# Patient Record
Sex: Male | Born: 1940
Health system: Southern US, Community
[De-identification: ages and names within clinical notes are randomized; demographics above are authoritative.]

## PROBLEM LIST (undated history)

## (undated) DIAGNOSIS — R55 Syncope and collapse: Secondary | ICD-10-CM

## (undated) DIAGNOSIS — E7800 Pure hypercholesterolemia, unspecified: Secondary | ICD-10-CM

## (undated) DIAGNOSIS — F32A Depression, unspecified: Secondary | ICD-10-CM

## (undated) DIAGNOSIS — Z950 Presence of cardiac pacemaker: Secondary | ICD-10-CM

## (undated) DIAGNOSIS — F419 Anxiety disorder, unspecified: Secondary | ICD-10-CM

## (undated) DIAGNOSIS — M25569 Pain in unspecified knee: Secondary | ICD-10-CM

## (undated) DIAGNOSIS — F329 Major depressive disorder, single episode, unspecified: Secondary | ICD-10-CM

## (undated) DIAGNOSIS — I509 Heart failure, unspecified: Secondary | ICD-10-CM

## (undated) DIAGNOSIS — M1990 Unspecified osteoarthritis, unspecified site: Secondary | ICD-10-CM

## (undated) DIAGNOSIS — IMO0001 Reserved for inherently not codable concepts without codable children: Secondary | ICD-10-CM

## (undated) DIAGNOSIS — T07XXXA Unspecified multiple injuries, initial encounter: Secondary | ICD-10-CM

## (undated) DIAGNOSIS — I429 Cardiomyopathy, unspecified: Secondary | ICD-10-CM

## (undated) DIAGNOSIS — I459 Conduction disorder, unspecified: Secondary | ICD-10-CM

## (undated) DIAGNOSIS — I10 Essential (primary) hypertension: Secondary | ICD-10-CM

## (undated) DIAGNOSIS — I6529 Occlusion and stenosis of unspecified carotid artery: Secondary | ICD-10-CM

## (undated) DIAGNOSIS — I2510 Atherosclerotic heart disease of native coronary artery without angina pectoris: Secondary | ICD-10-CM

## (undated) DIAGNOSIS — I639 Cerebral infarction, unspecified: Secondary | ICD-10-CM

## (undated) DIAGNOSIS — R739 Hyperglycemia, unspecified: Secondary | ICD-10-CM

## (undated) DIAGNOSIS — K259 Gastric ulcer, unspecified as acute or chronic, without hemorrhage or perforation: Secondary | ICD-10-CM

## (undated) DIAGNOSIS — E039 Hypothyroidism, unspecified: Secondary | ICD-10-CM

## (undated) DIAGNOSIS — K219 Gastro-esophageal reflux disease without esophagitis: Secondary | ICD-10-CM

## (undated) DIAGNOSIS — D649 Anemia, unspecified: Secondary | ICD-10-CM

## (undated) HISTORY — PX: PERCUTANEOUS PLACEMENT INTRAVASCULAR STENT CERVICAL CAROTID ARTERY: SUR1019

## (undated) HISTORY — DX: Hypothyroidism, unspecified: E03.9

## (undated) HISTORY — DX: Unspecified multiple injuries, initial encounter: T07.XXXA

## (undated) HISTORY — DX: Occlusion and stenosis of unspecified carotid artery: I65.29

## (undated) HISTORY — PX: HERNIA REPAIR: SHX51

## (undated) HISTORY — DX: Syncope and collapse: R55

## (undated) HISTORY — PX: CARDIAC CATHETERIZATION: SHX172

## (undated) HISTORY — DX: Essential (primary) hypertension: I10

## (undated) HISTORY — PX: JOINT REPLACEMENT: SHX530

## (undated) HISTORY — DX: Atherosclerotic heart disease of native coronary artery without angina pectoris: I25.10

## (undated) HISTORY — DX: Gastric ulcer, unspecified as acute or chronic, without hemorrhage or perforation: K25.9

## (undated) HISTORY — DX: Hyperglycemia, unspecified: R73.9

## (undated) HISTORY — DX: Anemia, unspecified: D64.9

## (undated) HISTORY — DX: Pure hypercholesterolemia, unspecified: E78.00

## (undated) HISTORY — PX: INSERT / REPLACE / REMOVE PACEMAKER: SUR710

## (undated) HISTORY — PX: CORONARY ANGIOPLASTY: SHX604

---

## 2004-08-29 ENCOUNTER — Ambulatory Visit: Payer: Self-pay | Admitting: Internal Medicine

## 2005-09-19 ENCOUNTER — Ambulatory Visit: Payer: Self-pay | Admitting: Internal Medicine

## 2006-11-05 ENCOUNTER — Ambulatory Visit: Payer: Self-pay | Admitting: Gastroenterology

## 2006-12-06 ENCOUNTER — Emergency Department: Payer: Self-pay | Admitting: Emergency Medicine

## 2006-12-06 ENCOUNTER — Other Ambulatory Visit: Payer: Self-pay

## 2007-04-01 ENCOUNTER — Ambulatory Visit: Payer: Self-pay | Admitting: Internal Medicine

## 2007-06-10 ENCOUNTER — Ambulatory Visit: Payer: Self-pay | Admitting: Internal Medicine

## 2007-07-15 ENCOUNTER — Ambulatory Visit: Payer: Self-pay | Admitting: Internal Medicine

## 2007-07-19 ENCOUNTER — Ambulatory Visit: Payer: Self-pay | Admitting: Internal Medicine

## 2007-07-25 ENCOUNTER — Ambulatory Visit: Payer: Self-pay | Admitting: Vascular Surgery

## 2007-07-28 ENCOUNTER — Inpatient Hospital Stay: Payer: Self-pay | Admitting: Vascular Surgery

## 2007-07-31 ENCOUNTER — Encounter: Payer: Self-pay | Admitting: Vascular Surgery

## 2007-08-25 ENCOUNTER — Encounter: Payer: Self-pay | Admitting: Vascular Surgery

## 2007-08-25 HISTORY — PX: PACEMAKER INSERTION: SHX728

## 2007-09-25 ENCOUNTER — Encounter: Payer: Self-pay | Admitting: Vascular Surgery

## 2007-10-26 ENCOUNTER — Encounter: Payer: Self-pay | Admitting: Vascular Surgery

## 2007-11-23 ENCOUNTER — Encounter: Payer: Self-pay | Admitting: Vascular Surgery

## 2008-07-26 ENCOUNTER — Ambulatory Visit: Payer: Self-pay | Admitting: Unknown Physician Specialty

## 2009-06-17 IMAGING — XA IR VASCULAR PROCEDURE
14 of 20 series · 15 of 24 positions shown · non-contrast
Comparison: none

[Series 1: run · 1 of 10 slices shown (1 of 14)]
[im 1/10]
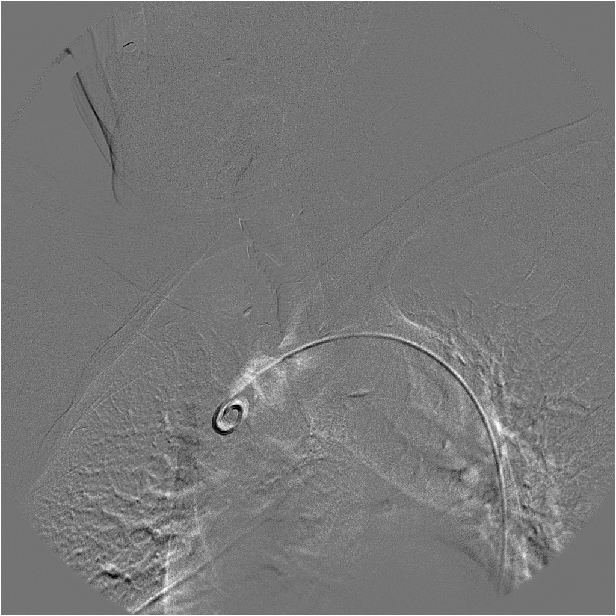

[Series 1: run · 1 of 38 slices shown (2 of 14)]
[im 38/38]
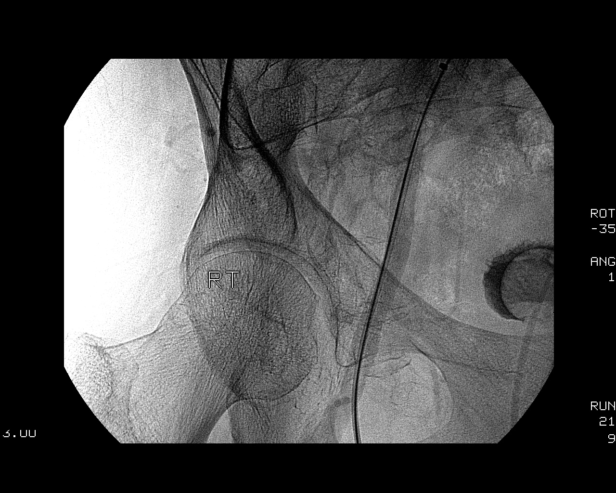

[Series 3: run · 1 of 10 slices shown (3 of 14)]
[im 1/10]
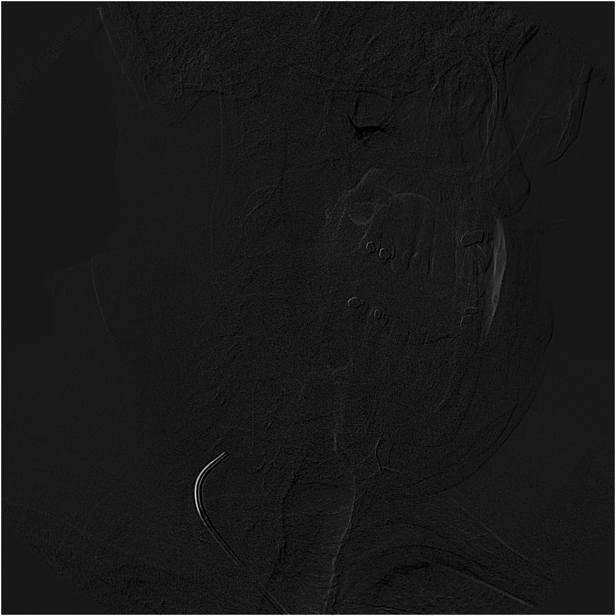

[Series 5: run · 1 of 14 slices shown (4 of 14)]
[im 1/14]
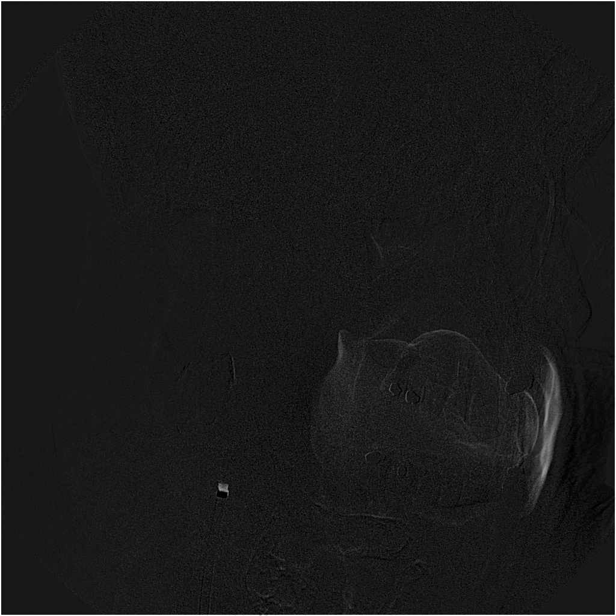

[Series 7: run · 2 of 17 slices shown (5 of 14)]
[im 1/17]
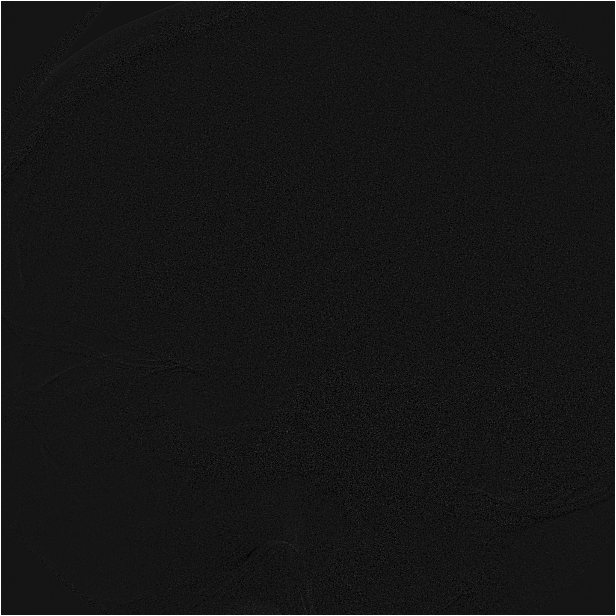
[im 17/17]
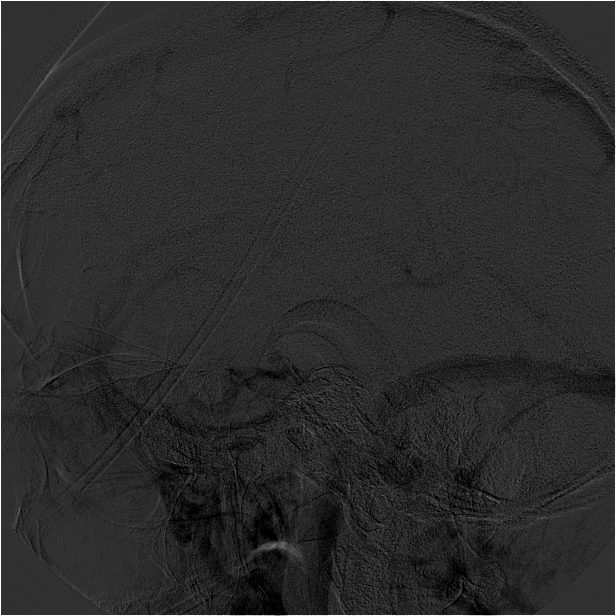

[Series 9: run · 1 of 10 slices shown (6 of 14)]
[im 1/10]
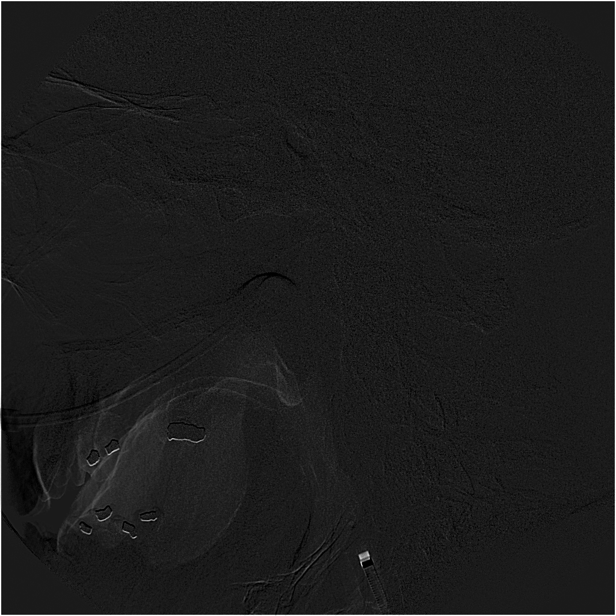

[Series 11: run · 1 of 14 slices shown (7 of 14)]
[im 1/14]
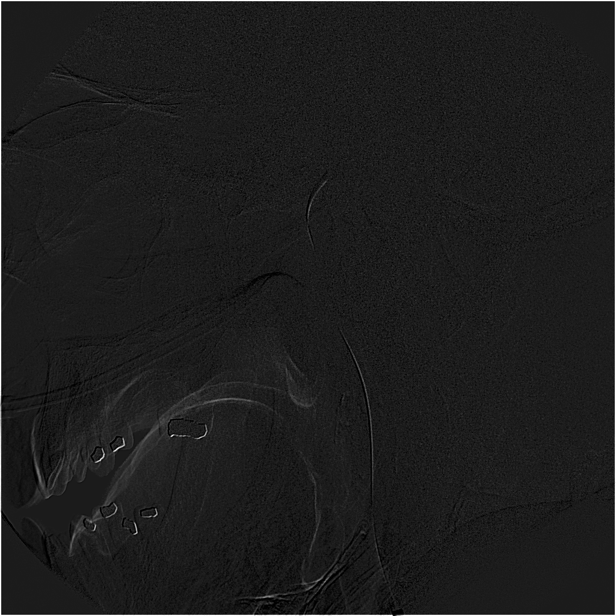

[Series 12: run · 1 of 10 slices shown (8 of 14)]
[im 1/10]
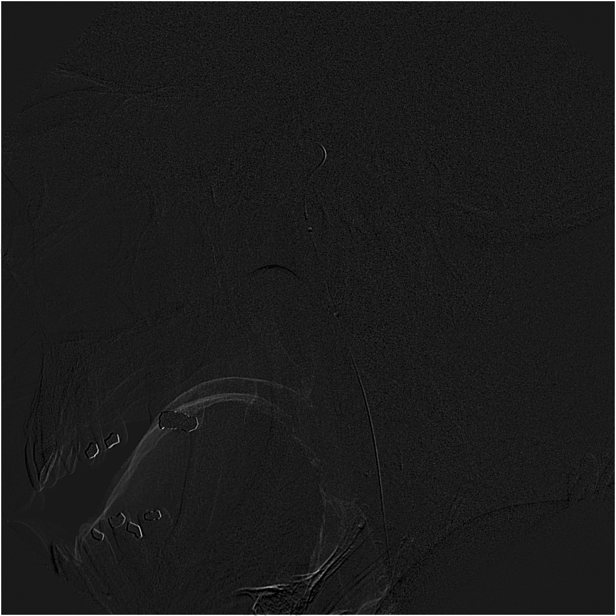

[Series 14: run · 1 of 10 slices shown (9 of 14)]
[im 1/10]
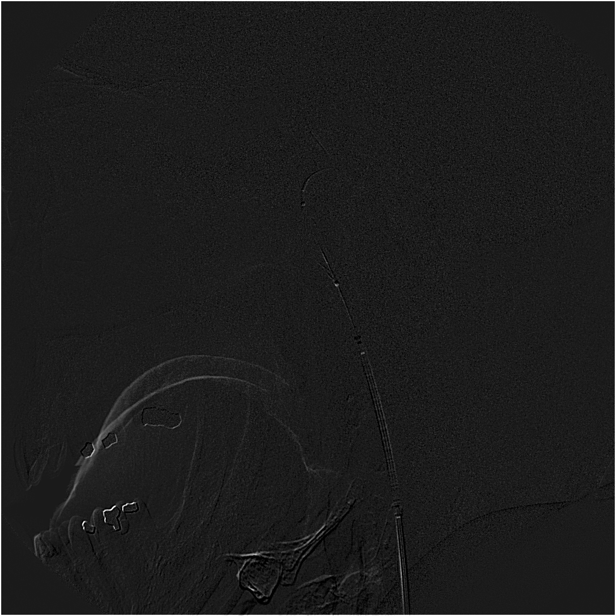

[Series 15: run · 1 of 11 slices shown (10 of 14)]
[im 1/11]
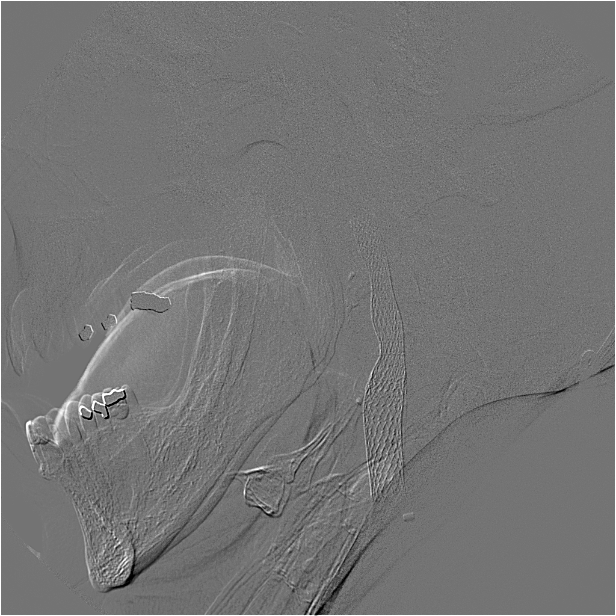

[Series 18: run · 1 of 17 slices shown (11 of 14)]
[im 1/17]
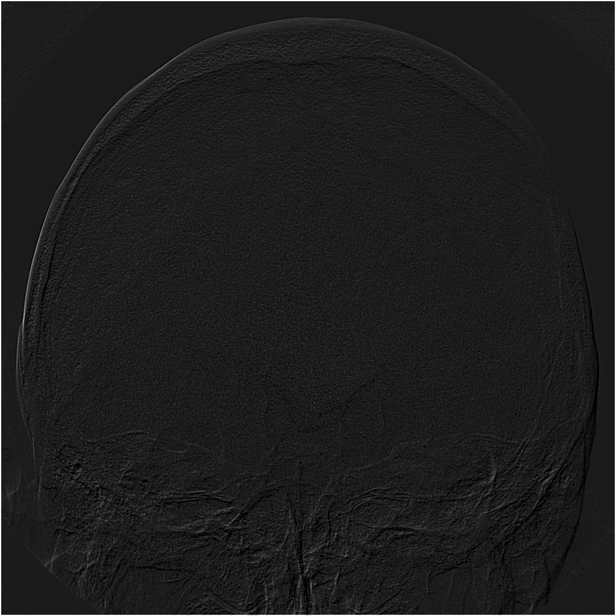

[Series 19: run · 1 of 10 slices shown (12 of 14)]
[im 1/10]
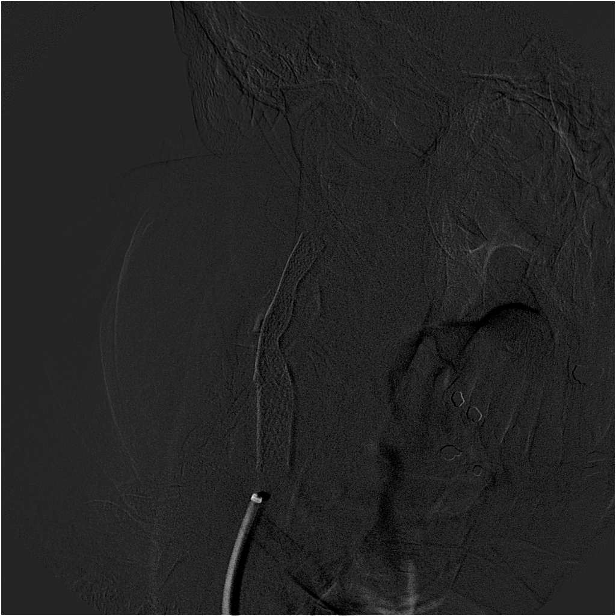

[Series 20: run · 1 of 17 slices shown (13 of 14)]
[im 1/17]
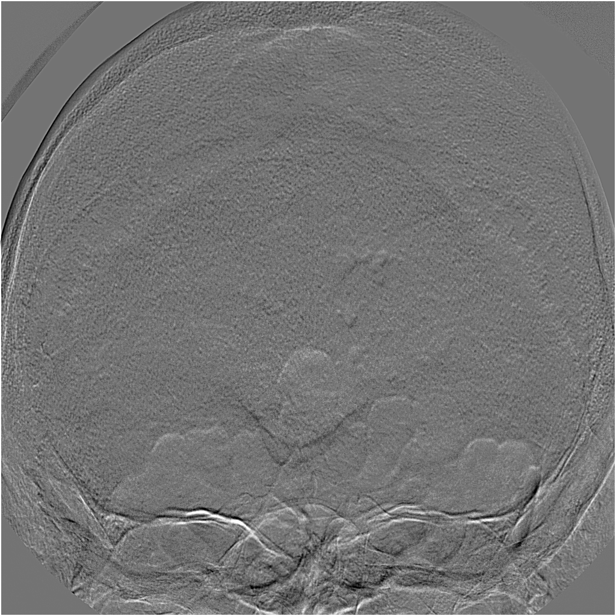

[Series 21: run · 1 of 9 slices shown (14 of 14)]
[im 1/9]
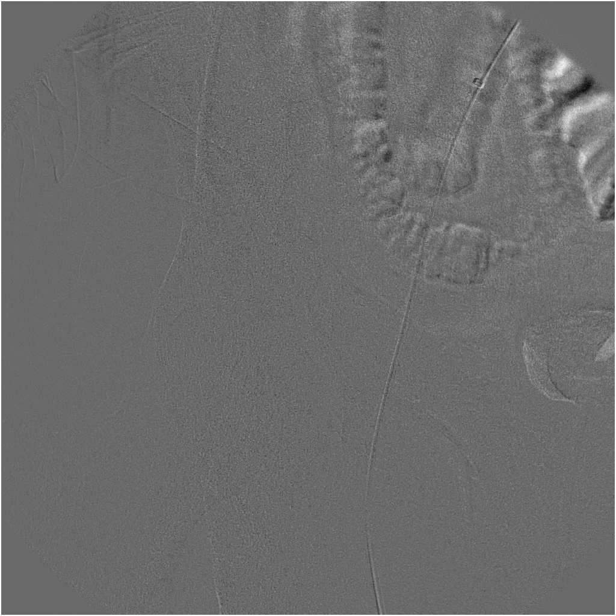

[15 of 24 positions shown; findings below may reference images not displayed]

IMAGES IMPORTED FROM THE [employer] WORKFLOW SYSTEM
NO DICTATION FOR STUDY

## 2010-12-20 ENCOUNTER — Ambulatory Visit: Payer: Self-pay | Admitting: Gastroenterology

## 2011-11-07 DIAGNOSIS — D649 Anemia, unspecified: Secondary | ICD-10-CM | POA: Diagnosis not present

## 2011-11-07 DIAGNOSIS — Z79899 Other long term (current) drug therapy: Secondary | ICD-10-CM | POA: Diagnosis not present

## 2011-11-07 DIAGNOSIS — I10 Essential (primary) hypertension: Secondary | ICD-10-CM | POA: Diagnosis not present

## 2011-11-07 DIAGNOSIS — E7800 Pure hypercholesterolemia, unspecified: Secondary | ICD-10-CM | POA: Diagnosis not present

## 2011-11-12 DIAGNOSIS — I2510 Atherosclerotic heart disease of native coronary artery without angina pectoris: Secondary | ICD-10-CM | POA: Diagnosis not present

## 2011-11-12 DIAGNOSIS — I10 Essential (primary) hypertension: Secondary | ICD-10-CM | POA: Diagnosis not present

## 2011-11-12 DIAGNOSIS — M79609 Pain in unspecified limb: Secondary | ICD-10-CM | POA: Diagnosis not present

## 2011-11-12 DIAGNOSIS — D649 Anemia, unspecified: Secondary | ICD-10-CM | POA: Diagnosis not present

## 2011-11-12 DIAGNOSIS — E7800 Pure hypercholesterolemia, unspecified: Secondary | ICD-10-CM | POA: Diagnosis not present

## 2011-11-12 DIAGNOSIS — B351 Tinea unguium: Secondary | ICD-10-CM | POA: Diagnosis not present

## 2011-11-21 DIAGNOSIS — Z950 Presence of cardiac pacemaker: Secondary | ICD-10-CM | POA: Diagnosis not present

## 2012-02-05 DIAGNOSIS — I2510 Atherosclerotic heart disease of native coronary artery without angina pectoris: Secondary | ICD-10-CM | POA: Diagnosis not present

## 2012-02-05 DIAGNOSIS — I441 Atrioventricular block, second degree: Secondary | ICD-10-CM | POA: Diagnosis not present

## 2012-02-11 DIAGNOSIS — M79609 Pain in unspecified limb: Secondary | ICD-10-CM | POA: Diagnosis not present

## 2012-02-11 DIAGNOSIS — B351 Tinea unguium: Secondary | ICD-10-CM | POA: Diagnosis not present

## 2012-02-20 DIAGNOSIS — Z950 Presence of cardiac pacemaker: Secondary | ICD-10-CM | POA: Diagnosis not present

## 2012-03-11 DIAGNOSIS — D649 Anemia, unspecified: Secondary | ICD-10-CM | POA: Diagnosis not present

## 2012-03-11 DIAGNOSIS — E039 Hypothyroidism, unspecified: Secondary | ICD-10-CM | POA: Diagnosis not present

## 2012-03-11 DIAGNOSIS — I10 Essential (primary) hypertension: Secondary | ICD-10-CM | POA: Diagnosis not present

## 2012-03-11 DIAGNOSIS — E7800 Pure hypercholesterolemia, unspecified: Secondary | ICD-10-CM | POA: Diagnosis not present

## 2012-03-11 DIAGNOSIS — Z79899 Other long term (current) drug therapy: Secondary | ICD-10-CM | POA: Diagnosis not present

## 2012-03-18 DIAGNOSIS — I10 Essential (primary) hypertension: Secondary | ICD-10-CM | POA: Diagnosis not present

## 2012-03-18 DIAGNOSIS — E7800 Pure hypercholesterolemia, unspecified: Secondary | ICD-10-CM | POA: Diagnosis not present

## 2012-05-27 DIAGNOSIS — Z950 Presence of cardiac pacemaker: Secondary | ICD-10-CM | POA: Diagnosis not present

## 2012-07-22 DIAGNOSIS — Z23 Encounter for immunization: Secondary | ICD-10-CM | POA: Diagnosis not present

## 2012-07-25 ENCOUNTER — Telehealth: Payer: Self-pay | Admitting: Internal Medicine

## 2012-07-25 NOTE — Telephone Encounter (Signed)
Ok to call in #30 with 3 refills

## 2012-07-25 NOTE — Telephone Encounter (Signed)
Please advise 

## 2012-07-25 NOTE — Telephone Encounter (Signed)
Refill request for lorazepam 1 mg tablet Sig: take 1 tablet by mouth every day Patient has an appt. On 11/04/12

## 2012-07-29 DIAGNOSIS — I441 Atrioventricular block, second degree: Secondary | ICD-10-CM | POA: Diagnosis not present

## 2012-07-29 DIAGNOSIS — I2510 Atherosclerotic heart disease of native coronary artery without angina pectoris: Secondary | ICD-10-CM | POA: Diagnosis not present

## 2012-07-29 MED ORDER — LORAZEPAM 1 MG PO TABS: 1.0000 mg | ORAL_TABLET | Freq: Once | ORAL | Status: DC

## 2012-07-29 NOTE — Telephone Encounter (Signed)
[facility]ep[facility] called [facility]. As directed by Dr. Lorin Picket

## 2012-08-22 ENCOUNTER — Telehealth: Payer: Self-pay | Admitting: Internal Medicine

## 2012-08-22 MED ORDER — ROSUVASTATIN CALCIUM 10 MG PO TABS: 10.0000 mg | ORAL_TABLET | Freq: Every day | ORAL | Status: DC

## 2012-08-22 NOTE — Telephone Encounter (Signed)
rx sent to [facility] - for crestor 10mg  q day #90 with one refill

## 2012-09-05 ENCOUNTER — Telehealth: Payer: Self-pay | Admitting: Internal Medicine

## 2012-09-05 MED ORDER — ROSUVASTATIN CALCIUM 10 MG PO TABS: 10.0000 mg | ORAL_TABLET | Freq: Every day | ORAL | Status: DC

## 2012-09-05 NOTE — Telephone Encounter (Signed)
Refill request for crestor 10 mg  Qty: 90 day supply Sig: take one tablet by mouth every day

## 2012-09-05 NOTE — Telephone Encounter (Signed)
Sent in to pharmacy.  

## 2012-09-12 DIAGNOSIS — I6529 Occlusion and stenosis of unspecified carotid artery: Secondary | ICD-10-CM | POA: Diagnosis not present

## 2012-09-12 DIAGNOSIS — E785 Hyperlipidemia, unspecified: Secondary | ICD-10-CM | POA: Diagnosis not present

## 2012-09-12 DIAGNOSIS — I10 Essential (primary) hypertension: Secondary | ICD-10-CM | POA: Diagnosis not present

## 2012-10-31 ENCOUNTER — Encounter: Payer: Self-pay | Admitting: Internal Medicine

## 2012-10-31 DIAGNOSIS — I2510 Atherosclerotic heart disease of native coronary artery without angina pectoris: Secondary | ICD-10-CM | POA: Insufficient documentation

## 2012-10-31 DIAGNOSIS — IMO0001 Reserved for inherently not codable concepts without codable children: Secondary | ICD-10-CM | POA: Insufficient documentation

## 2012-10-31 DIAGNOSIS — D649 Anemia, unspecified: Secondary | ICD-10-CM | POA: Insufficient documentation

## 2012-10-31 DIAGNOSIS — I495 Sick sinus syndrome: Secondary | ICD-10-CM | POA: Diagnosis not present

## 2012-10-31 DIAGNOSIS — I10 Essential (primary) hypertension: Secondary | ICD-10-CM | POA: Insufficient documentation

## 2012-10-31 DIAGNOSIS — Z950 Presence of cardiac pacemaker: Secondary | ICD-10-CM | POA: Diagnosis not present

## 2012-10-31 DIAGNOSIS — E7800 Pure hypercholesterolemia, unspecified: Secondary | ICD-10-CM | POA: Insufficient documentation

## 2012-10-31 DIAGNOSIS — E039 Hypothyroidism, unspecified: Secondary | ICD-10-CM | POA: Insufficient documentation

## 2012-10-31 DIAGNOSIS — R1310 Dysphagia, unspecified: Secondary | ICD-10-CM | POA: Insufficient documentation

## 2012-11-04 ENCOUNTER — Ambulatory Visit (INDEPENDENT_AMBULATORY_CARE_PROVIDER_SITE_OTHER): Payer: PRIVATE HEALTH INSURANCE | Admitting: Internal Medicine

## 2012-11-04 ENCOUNTER — Encounter: Payer: Self-pay | Admitting: Internal Medicine

## 2012-11-04 VITALS — BP 120/90 | HR 64 | Temp 97.4°F | Ht 69.5 in | Wt 214.0 lb

## 2012-11-04 DIAGNOSIS — R079 Chest pain, unspecified: Secondary | ICD-10-CM

## 2012-11-04 DIAGNOSIS — D649 Anemia, unspecified: Secondary | ICD-10-CM

## 2012-11-04 DIAGNOSIS — Z125 Encounter for screening for malignant neoplasm of prostate: Secondary | ICD-10-CM

## 2012-11-04 DIAGNOSIS — E7800 Pure hypercholesterolemia, unspecified: Secondary | ICD-10-CM

## 2012-11-04 DIAGNOSIS — R1310 Dysphagia, unspecified: Secondary | ICD-10-CM

## 2012-11-04 DIAGNOSIS — I6529 Occlusion and stenosis of unspecified carotid artery: Secondary | ICD-10-CM

## 2012-11-04 DIAGNOSIS — R739 Hyperglycemia, unspecified: Secondary | ICD-10-CM

## 2012-11-04 DIAGNOSIS — E039 Hypothyroidism, unspecified: Secondary | ICD-10-CM

## 2012-11-04 DIAGNOSIS — I2510 Atherosclerotic heart disease of native coronary artery without angina pectoris: Secondary | ICD-10-CM

## 2012-11-04 DIAGNOSIS — I10 Essential (primary) hypertension: Secondary | ICD-10-CM

## 2012-11-04 DIAGNOSIS — R7309 Other abnormal glucose: Secondary | ICD-10-CM

## 2012-11-05 ENCOUNTER — Encounter: Payer: Self-pay | Admitting: Internal Medicine

## 2012-11-05 DIAGNOSIS — I6529 Occlusion and stenosis of unspecified carotid artery: Secondary | ICD-10-CM | POA: Insufficient documentation

## 2012-11-05 DIAGNOSIS — R739 Hyperglycemia, unspecified: Secondary | ICD-10-CM | POA: Insufficient documentation

## 2012-11-05 DIAGNOSIS — R7309 Other abnormal glucose: Secondary | ICD-10-CM | POA: Insufficient documentation

## 2012-11-05 DIAGNOSIS — R0989 Other specified symptoms and signs involving the circulatory and respiratory systems: Secondary | ICD-10-CM | POA: Diagnosis not present

## 2012-11-05 DIAGNOSIS — R0609 Other forms of dyspnea: Secondary | ICD-10-CM | POA: Diagnosis not present

## 2012-11-05 DIAGNOSIS — I2510 Atherosclerotic heart disease of native coronary artery without angina pectoris: Secondary | ICD-10-CM | POA: Diagnosis not present

## 2012-11-05 DIAGNOSIS — R0789 Other chest pain: Secondary | ICD-10-CM | POA: Diagnosis not present

## 2012-11-05 DIAGNOSIS — I441 Atrioventricular block, second degree: Secondary | ICD-10-CM | POA: Diagnosis not present

## 2012-11-05 NOTE — Progress Notes (Signed)
Subjective:    Patient ID: Daniel Austin, male    DOB: [birth year], 72 y.o.   MRN: [id number]  HPI 72 year old male with past history of hypothyroidism, hypertension, hypercholesterolemia, hyperglycemia and multivessel CAD (followed by Dr Darrold Junker) who is s/p pacemaker placement for second degree heart block.  He also is s/p stent x 2 for symptomatic high grade carotid stenosis.  He comes in today to follow up on these issues as well as for a complete physical exam.  He states that approximately one week ago, he developed some sob and chest tightness.  Felt like he was going to pass out.  He called in to have his pacemaker checked.  States after this, he felt better. The symptoms lasted approximately two days.  Since then, he states that he has had no further chest tightness.  No sob.  No syncope or near syncopal feelings.  He has increased his celexa to 40mg  q day.  (did this on his own).  States he feels better since doing this.  Feels back to his baseline.  No nausea or vomiting.  No acid reflux.  Just had his carotids checked.  States everything checked out fine.    Past Medical History  Diagnosis Date  . Hypercholesterolemia   . Hypothyroidism   . Hypertension   . Fractures, multiple     Metal rod in left foot  . CAD (coronary artery disease)   . Carotid artery stenosis     high-grade symptomatic right sided s/p stent twice  . Syncope     second degree heart block s/p St Jude's pacemaker placement 12/08  . Anemia   . Ulcer of gastric fundus   . Hyperglycemia     Current Outpatient Prescriptions on File Prior to Visit  Medication Sig Dispense Refill  . Aspirin 81 MG EC tablet Take 81 mg by mouth daily.      . citalopram (CELEXA) 20 MG tablet Take 20 mg by mouth daily.      . clopidogrel (PLAVIX) 75 MG tablet Take 75 mg by mouth daily.      . fexofenadine (ALLEGRA) 180 MG tablet Take 180 mg by mouth daily.      . fluticasone (FLONASE) 50 MCG/ACT nasal spray 2 sprays. Into each  nostril daily.      Marland Kitchen levothyroxine (SYNTHROID, LEVOTHROID) 112 MCG tablet Take 112 mcg by mouth daily.      Marland Kitchen lisinopril (PRINIVIL,ZESTRIL) 40 MG tablet Take 40 mg by mouth daily.      Marland Kitchen LORazepam (ATIVAN) 1 MG tablet Take 1 tablet (1 mg total) by mouth once. daily  30 tablet  3  . metoprolol tartrate (LOPRESSOR) 25 MG tablet Take 25 mg by mouth daily.       . nitroGLYCERIN (NITROSTAT) 0.4 MG SL tablet Place 0.4 mg under the tongue every 5 (five) minutes as needed. If a third tablet is needed please call 911.      . omeprazole (PRILOSEC) 20 MG capsule Take 20 mg by mouth daily.      . rosuvastatin (CRESTOR) 10 MG tablet Take 1 tablet (10 mg total) by mouth daily.  90 tablet  0   No current facility-administered medications on file prior to visit.    Review of Systems Patient denies any headache, lightheadedness or dizziness.  No syncope or near syncopal episodes now.   No chest pain, tightness or palpitations currently.   He does report the previous chest tightness. No increased shortness of breath, cough or  congestion.  No nausea or vomiting.  No acid reflux.  No abdominal pain or cramping.  No bowel change, such as diarrhea, constipation, BRBPR or melana.  No urine change.  Some increased stress.  He increased his citalopram dosage on his own.  Feels better.        Objective:   Physical Exam  Filed Vitals:   11/04/12 1507  BP: 120/90  Pulse: 64  Temp: 97.4 F (36.3 C)   Blood pressure recheck:  19/40  72 year old male in no acute distress.  HEENT:  Nares - clear.  Oropharynx - without lesions. NECK:  Supple.  Nontender.   HEART:  Appears to be regular.   LUNGS:  No crackles or wheezing audible.  Respirations even and unlabored.   RADIAL PULSE:  Equal bilaterally.  ABDOMEN:  Soft.  Nontender.  Bowel sounds present and normal.  No audible abdominal bruit.  GU:  Normal descended testicles.  No palpable testicular nodules.   RECTAL:  Could not appreciate any palpable prostate  nodules.  Heme negative.   EXTREMITIES:  No increased edema present.  DP pulses palpable and equal bilaterally.          Assessment & Plan:  HEALTH MAINTENANCE.  Physical today.  Check PSA.  Colonoscopy 12/20/10 - diverticulosis.  Recommended repeat colonoscopy - five years.

## 2012-11-05 NOTE — Assessment & Plan Note (Signed)
Low cholesterol diet.  Remains on crestor.  Check lipid profile and liver panel.

## 2012-11-05 NOTE — Assessment & Plan Note (Signed)
Blood pressure on recheck improved.  Have him spot check his pressure.  Follow.  Check metabolic panel.

## 2012-11-05 NOTE — Assessment & Plan Note (Signed)
Has high grade stenosis.  S/p stent x 2.  Sees Dr Wyn Quaker.  States just checked and everything checked out ok.  Continue risk factor modification.

## 2012-11-05 NOTE — Assessment & Plan Note (Signed)
Low carb diet and exercise.  Check metabolic panel and a1c.   

## 2012-11-05 NOTE — Assessment & Plan Note (Signed)
On thyroid replacement.  Check tsh.  

## 2012-11-05 NOTE — Assessment & Plan Note (Signed)
Symptoms as outlined.  EKG revealed new T wave inversions inferiorly.  He is currently asymptomatic.  Discussed at length with him.  He had the tightness and sob last week.  Feels back to baseline now.  Needs further cardiac w/up.  We discussed ER evaluation.  Called cardiology.  Dr Darrold Junker can see him tomorrow.  Since asymptomatic now, will plan for follow up tomorrow with cardiology.  He was instructed if he had any reoccurring symptoms or problems, he is to be seen immediately.  Pt and wife comfortable with this plan.

## 2012-11-05 NOTE — Assessment & Plan Note (Signed)
No problems swallowing now.  Follow.   ?

## 2012-11-05 NOTE — Assessment & Plan Note (Signed)
Has had GI w/up.  Check cbc and ferritin.

## 2012-11-13 ENCOUNTER — Telehealth: Payer: Self-pay | Admitting: Internal Medicine

## 2012-11-13 MED ORDER — CITALOPRAM HYDROBROMIDE 20 MG PO TABS: 20.0000 mg | ORAL_TABLET | Freq: Every day | ORAL | Status: DC

## 2012-11-13 NOTE — Telephone Encounter (Signed)
Prescription sent in for celexa.  #90 with 1 refill

## 2012-11-25 DIAGNOSIS — I2510 Atherosclerotic heart disease of native coronary artery without angina pectoris: Secondary | ICD-10-CM | POA: Diagnosis not present

## 2012-11-25 DIAGNOSIS — I441 Atrioventricular block, second degree: Secondary | ICD-10-CM | POA: Diagnosis not present

## 2012-11-25 DIAGNOSIS — R9430 Abnormal result of cardiovascular function study, unspecified: Secondary | ICD-10-CM | POA: Diagnosis not present

## 2012-11-27 ENCOUNTER — Other Ambulatory Visit: Payer: Self-pay | Admitting: *Deleted

## 2012-11-27 NOTE — Telephone Encounter (Deleted)
Dr. Lorin Picket,  I am routing this script to you. Please advise

## 2012-11-30 ENCOUNTER — Telehealth: Payer: Self-pay | Admitting: Internal Medicine

## 2012-11-30 MED ORDER — LEVOTHYROXINE SODIUM 112 MCG PO TABS: 112.0000 ug | ORAL_TABLET | Freq: Every day | ORAL | Status: DC

## 2012-11-30 NOTE — Telephone Encounter (Signed)
Ordered levothyroxine refills

## 2012-12-01 MED ORDER — CLOPIDOGREL BISULFATE 75 MG PO TABS: 75.0000 mg | ORAL_TABLET | Freq: Every day | ORAL | Status: DC

## 2012-12-01 NOTE — Telephone Encounter (Signed)
Sent in to pharmacy.  

## 2012-12-02 ENCOUNTER — Telehealth: Payer: Self-pay | Admitting: Internal Medicine

## 2012-12-02 MED ORDER — LISINOPRIL 40 MG PO TABS: 40.0000 mg | ORAL_TABLET | Freq: Every day | ORAL | Status: DC

## 2012-12-02 NOTE — Telephone Encounter (Signed)
Sent in to pharmacy.  

## 2012-12-02 NOTE — Telephone Encounter (Signed)
[facility]ril 40 mg 90 day supply on 3/10 and have not heard back.

## 2013-02-06 DIAGNOSIS — Z950 Presence of cardiac pacemaker: Secondary | ICD-10-CM | POA: Diagnosis not present

## 2013-02-06 DIAGNOSIS — I495 Sick sinus syndrome: Secondary | ICD-10-CM | POA: Diagnosis not present

## 2013-02-22 ENCOUNTER — Other Ambulatory Visit: Payer: Self-pay | Admitting: Internal Medicine

## 2013-02-23 NOTE — Telephone Encounter (Signed)
Just refill x 1 until labs are done.  Thanks.

## 2013-02-23 NOTE — Telephone Encounter (Signed)
Refilled x 30 days. 

## 2013-02-23 NOTE — Telephone Encounter (Signed)
Spoke with wife Daniel Austin, I informed her that Daniel Austin needs to call the office asap to schedule his fasting labs before we can continue refilling his medications. His lab orders were placed at his last visit on 11/05/12, but lab app tnever scheduled. Pt needs a refill on his Crestor.

## 2013-03-05 ENCOUNTER — Other Ambulatory Visit: Payer: Medicare Other

## 2013-03-07 ENCOUNTER — Other Ambulatory Visit: Payer: Self-pay | Admitting: Internal Medicine

## 2013-03-09 NOTE — Telephone Encounter (Signed)
Please Advise

## 2013-03-09 NOTE — Telephone Encounter (Signed)
Refilled lorazepam #30 with one refill.   

## 2013-03-11 ENCOUNTER — Other Ambulatory Visit (INDEPENDENT_AMBULATORY_CARE_PROVIDER_SITE_OTHER): Payer: Medicare Other

## 2013-03-11 DIAGNOSIS — D649 Anemia, unspecified: Secondary | ICD-10-CM | POA: Diagnosis not present

## 2013-03-11 DIAGNOSIS — R739 Hyperglycemia, unspecified: Secondary | ICD-10-CM

## 2013-03-11 DIAGNOSIS — E039 Hypothyroidism, unspecified: Secondary | ICD-10-CM

## 2013-03-11 DIAGNOSIS — Z125 Encounter for screening for malignant neoplasm of prostate: Secondary | ICD-10-CM

## 2013-03-11 DIAGNOSIS — R7309 Other abnormal glucose: Secondary | ICD-10-CM | POA: Diagnosis not present

## 2013-03-11 DIAGNOSIS — E7800 Pure hypercholesterolemia, unspecified: Secondary | ICD-10-CM

## 2013-03-11 DIAGNOSIS — I10 Essential (primary) hypertension: Secondary | ICD-10-CM

## 2013-03-11 LAB — HEPATIC FUNCTION PANEL
Albumin: 4.3 g/dL (ref 3.5–5.2)
Alkaline Phosphatase: 72 U/L (ref 39–117)
Total Protein: 7 g/dL (ref 6.0–8.3)

## 2013-03-11 LAB — CBC WITH DIFFERENTIAL/PLATELET
Basophils Absolute: 0 10*3/uL (ref 0.0–0.1)
Eosinophils Absolute: 0.3 10*3/uL (ref 0.0–0.7)
Eosinophils Relative: 8.9 % — ABNORMAL HIGH (ref 0.0–5.0)
HCT: 39.8 % (ref 39.0–52.0)
Lymphs Abs: 1 10*3/uL (ref 0.7–4.0)
MCHC: 33.3 g/dL (ref 30.0–36.0)
MCV: 89.3 fl (ref 78.0–100.0)
Monocytes Absolute: 0.4 10*3/uL (ref 0.1–1.0)
Platelets: 222 10*3/uL (ref 150.0–400.0)
RDW: 14 % (ref 11.5–14.6)

## 2013-03-11 LAB — BASIC METABOLIC PANEL
Chloride: 98 mEq/L (ref 96–112)
Creatinine, Ser: 0.9 mg/dL (ref 0.4–1.5)

## 2013-03-11 LAB — FERRITIN: Ferritin: 41.7 ng/mL (ref 22.0–322.0)

## 2013-03-12 ENCOUNTER — Ambulatory Visit: Payer: Medicare Other

## 2013-03-12 DIAGNOSIS — I10 Essential (primary) hypertension: Secondary | ICD-10-CM

## 2013-03-12 LAB — LIPID PANEL
HDL: 48.6 mg/dL (ref >39.00)
LDL Cholesterol: 98 mg/dL (ref 0–99)
Total CHOL/HDL Ratio: 3
VLDL: 18.6 mg/dL (ref 0.0–40.0)

## 2013-03-17 ENCOUNTER — Other Ambulatory Visit: Payer: Self-pay | Admitting: Internal Medicine

## 2013-03-17 DIAGNOSIS — D72819 Decreased white blood cell count, unspecified: Secondary | ICD-10-CM

## 2013-03-17 NOTE — Progress Notes (Signed)
Follow up labs ordered.

## 2013-03-24 DIAGNOSIS — I519 Heart disease, unspecified: Secondary | ICD-10-CM | POA: Diagnosis not present

## 2013-03-24 DIAGNOSIS — I442 Atrioventricular block, complete: Secondary | ICD-10-CM | POA: Diagnosis not present

## 2013-03-24 DIAGNOSIS — I2510 Atherosclerotic heart disease of native coronary artery without angina pectoris: Secondary | ICD-10-CM | POA: Diagnosis not present

## 2013-03-24 DIAGNOSIS — R0609 Other forms of dyspnea: Secondary | ICD-10-CM | POA: Diagnosis not present

## 2013-03-24 DIAGNOSIS — R0989 Other specified symptoms and signs involving the circulatory and respiratory systems: Secondary | ICD-10-CM | POA: Diagnosis not present

## 2013-04-16 ENCOUNTER — Other Ambulatory Visit: Payer: Self-pay | Admitting: Internal Medicine

## 2013-04-21 DIAGNOSIS — I428 Other cardiomyopathies: Secondary | ICD-10-CM | POA: Diagnosis not present

## 2013-04-23 ENCOUNTER — Encounter: Payer: Self-pay | Admitting: *Deleted

## 2013-04-23 ENCOUNTER — Other Ambulatory Visit (INDEPENDENT_AMBULATORY_CARE_PROVIDER_SITE_OTHER): Payer: Medicare Other

## 2013-04-23 DIAGNOSIS — D72819 Decreased white blood cell count, unspecified: Secondary | ICD-10-CM

## 2013-04-23 LAB — CBC WITH DIFFERENTIAL/PLATELET
Eosinophils Relative: 7.1 % — ABNORMAL HIGH (ref 0.0–5.0)
Lymphocytes Relative: 29.3 % (ref 12.0–46.0)
MCV: 88.7 fl (ref 78.0–100.0)
Monocytes Absolute: 0.5 10*3/uL (ref 0.1–1.0)
Neutrophils Relative %: 51.2 % (ref 43.0–77.0)
Platelets: 217 10*3/uL (ref 150.0–400.0)
WBC: 4.2 10*3/uL — ABNORMAL LOW (ref 4.5–10.5)

## 2013-04-25 ENCOUNTER — Other Ambulatory Visit: Payer: Self-pay | Admitting: Internal Medicine

## 2013-04-26 ENCOUNTER — Other Ambulatory Visit: Payer: Self-pay | Admitting: Internal Medicine

## 2013-04-26 MED ORDER — METOPROLOL TARTRATE 25 MG PO TABS: 25.0000 mg | ORAL_TABLET | Freq: Every day | ORAL | Status: DC

## 2013-04-26 NOTE — Progress Notes (Signed)
Refilled metoprolol #90 with one refill

## 2013-05-04 ENCOUNTER — Encounter: Payer: Self-pay | Admitting: Cardiology

## 2013-05-06 ENCOUNTER — Other Ambulatory Visit: Payer: Self-pay | Admitting: Internal Medicine

## 2013-05-06 ENCOUNTER — Encounter: Payer: Self-pay | Admitting: Internal Medicine

## 2013-05-07 NOTE — Telephone Encounter (Signed)
Rx faxed to pharmacy  

## 2013-05-13 ENCOUNTER — Other Ambulatory Visit: Payer: Self-pay | Admitting: Internal Medicine

## 2013-05-14 DIAGNOSIS — M79609 Pain in unspecified limb: Secondary | ICD-10-CM | POA: Diagnosis not present

## 2013-05-14 DIAGNOSIS — B351 Tinea unguium: Secondary | ICD-10-CM | POA: Diagnosis not present

## 2013-05-14 NOTE — Telephone Encounter (Signed)
refaxed Rx that was faxed on 05/07/13. Spoke with pharmacy Rx last filled in July

## 2013-05-15 ENCOUNTER — Other Ambulatory Visit: Payer: Self-pay | Admitting: Internal Medicine

## 2013-05-15 NOTE — Telephone Encounter (Signed)
[facility]ist from [facility] called, stating neither of the 2 faxes for pt's Lorazepam were received. Gave verbal order for refill.

## 2013-05-21 ENCOUNTER — Ambulatory Visit (INDEPENDENT_AMBULATORY_CARE_PROVIDER_SITE_OTHER): Payer: Medicare Other | Admitting: Internal Medicine

## 2013-05-21 ENCOUNTER — Encounter: Payer: Self-pay | Admitting: Internal Medicine

## 2013-05-21 VITALS — BP 110/70 | HR 67 | Temp 98.3°F | Ht 69.5 in | Wt 208.5 lb

## 2013-05-21 DIAGNOSIS — E039 Hypothyroidism, unspecified: Secondary | ICD-10-CM | POA: Diagnosis not present

## 2013-05-21 DIAGNOSIS — I2510 Atherosclerotic heart disease of native coronary artery without angina pectoris: Secondary | ICD-10-CM

## 2013-05-21 DIAGNOSIS — E7800 Pure hypercholesterolemia, unspecified: Secondary | ICD-10-CM | POA: Diagnosis not present

## 2013-05-21 DIAGNOSIS — I6529 Occlusion and stenosis of unspecified carotid artery: Secondary | ICD-10-CM | POA: Diagnosis not present

## 2013-05-21 DIAGNOSIS — I10 Essential (primary) hypertension: Secondary | ICD-10-CM

## 2013-05-21 DIAGNOSIS — R739 Hyperglycemia, unspecified: Secondary | ICD-10-CM

## 2013-05-21 DIAGNOSIS — R7309 Other abnormal glucose: Secondary | ICD-10-CM

## 2013-05-21 DIAGNOSIS — R1310 Dysphagia, unspecified: Secondary | ICD-10-CM

## 2013-05-21 DIAGNOSIS — D649 Anemia, unspecified: Secondary | ICD-10-CM

## 2013-05-21 MED ORDER — LISINOPRIL 40 MG PO TABS: 40.0000 mg | ORAL_TABLET | Freq: Every day | ORAL | Status: DC

## 2013-05-21 MED ORDER — CITALOPRAM HYDROBROMIDE 20 MG PO TABS: 20.0000 mg | ORAL_TABLET | Freq: Every day | ORAL | Status: DC

## 2013-05-21 MED ORDER — CLOPIDOGREL BISULFATE 75 MG PO TABS: 75.0000 mg | ORAL_TABLET | Freq: Every day | ORAL | Status: DC

## 2013-05-21 MED ORDER — LEVOTHYROXINE SODIUM 112 MCG PO TABS: 112.0000 ug | ORAL_TABLET | Freq: Every day | ORAL | Status: DC

## 2013-05-21 MED ORDER — PANTOPRAZOLE SODIUM 40 MG PO TBEC: 40.0000 mg | DELAYED_RELEASE_TABLET | Freq: Every day | ORAL | Status: DC

## 2013-05-21 MED ORDER — ROSUVASTATIN CALCIUM 10 MG PO TABS: ORAL_TABLET | ORAL | Status: DC

## 2013-05-21 NOTE — Progress Notes (Signed)
Subjective:    Patient ID: Daniel Austin, male    DOB: 25-Mar-1941, 72 y.o.   MRN: [id number]  HPI 72 year old male with past history of hypothyroidism, hypertension, hypercholesterolemia, hyperglycemia and multivessel CAD (followed by Dr Darrold Junker) who is s/p pacemaker placement for second degree heart block.  He also is s/p stent x 2 for symptomatic high grade carotid stenosis.  He comes in today for a scheduled follow up.  He states that he is doing relatively well.  Has f/u next month to have his pacemaker checked.  Sees Dr Darrold Junker.  States his blood pressure has been averaging 120/70s.  Overdue an eye check.  Sees Dr Druscilla Brownie.  Breathing stable.  No chest pain or tightness.     Past Medical History  Diagnosis Date  . Hypercholesterolemia   . Hypothyroidism   . Hypertension   . Fractures, multiple     Metal rod in left foot  . CAD (coronary artery disease)   . Carotid artery stenosis     high-grade symptomatic right sided s/p stent twice  . Syncope     second degree heart block s/p St Jude's pacemaker placement 12/08  . Anemia   . Ulcer of gastric fundus   . Hyperglycemia     Current Outpatient Prescriptions on File Prior to Visit  Medication Sig Dispense Refill  . Aspirin 81 MG EC tablet Take 81 mg by mouth daily.      . citalopram (CELEXA) 20 MG tablet TAKE 1 TABLET (20 MG TOTAL) BY MOUTH DAILY.  90 tablet  0  . clopidogrel (PLAVIX) 75 MG tablet Take 1 tablet (75 mg total) by mouth daily.  30 tablet  5  . CRESTOR 10 MG tablet TAKE 1 TABLET (10 MG TOTAL) BY MOUTH DAILY.  30 tablet  5  . fexofenadine (ALLEGRA) 180 MG tablet Take 180 mg by mouth daily.      Marland Kitchen levothyroxine (SYNTHROID, LEVOTHROID) 112 MCG tablet Take 1 tablet (112 mcg total) by mouth daily.  30 tablet  5  . lisinopril (PRINIVIL,ZESTRIL) 40 MG tablet Take 1 tablet (40 mg total) by mouth daily.  30 tablet  5  . LORazepam (ATIVAN) 1 MG tablet TAKE 1 TABLET BY MOUTH AT BEDTIME AS NEEDED  30 tablet  1  . metoprolol  tartrate (LOPRESSOR) 25 MG tablet Take 1 tablet (25 mg total) by mouth daily.  90 tablet  1  . nitroGLYCERIN (NITROSTAT) 0.4 MG SL tablet Place 0.4 mg under the tongue every 5 (five) minutes as needed. If a third tablet is needed please call 911.      . omeprazole (PRILOSEC) 20 MG capsule Take 20 mg by mouth daily.       No current facility-administered medications on file prior to visit.    Review of Systems Patient denies any headache, lightheadedness or dizziness.  No syncope or near syncopal episodes  No chest pain, tightness or palpitations.   No increased shortness of breath, cough or congestion.  No nausea or vomiting.  No acid reflux.  No abdominal pain or cramping.  No bowel change, such as diarrhea, constipation, BRBPR or melana.  No urine change.  Some increased stress.  Feels he is handling things relatively well.  Feels better.   Stays active.      Objective:   Physical Exam   Filed Vitals:   05/21/13 0937  BP: 110/70  Pulse: 67  Temp: 98.3 F (14.46 C)   72 year old male in no acute  distress.  HEENT:  Nares - clear.  Oropharynx - without lesions. NECK:  Supple.  Nontender.   HEART:  Appears to be regular.   LUNGS:  No crackles or wheezing audible.  Respirations even and unlabored.   RADIAL PULSE:  Equal bilaterally.  ABDOMEN:  Soft.  Nontender.  Bowel sounds present and normal.  No audible abdominal bruit.    EXTREMITIES:  No increased edema present.  DP pulses palpable and equal bilaterally.          Assessment & Plan:  HEALTH MAINTENANCE.  Physical 11/04/12.  PSA 03/11/13 - .38.  Colonoscopy 12/20/10 - diverticulosis.  Recommended repeat colonoscopy - five years.

## 2013-05-25 ENCOUNTER — Encounter: Payer: Self-pay | Admitting: Internal Medicine

## 2013-05-25 NOTE — Assessment & Plan Note (Signed)
Has high grade stenosis.  S/p stent x 2.  Sees Dr Wyn Quaker.  States just checked and everything checked out ok.  Continue risk factor modification.

## 2013-05-25 NOTE — Assessment & Plan Note (Signed)
No problems swallowing now.  Follow.   ?

## 2013-05-25 NOTE — Assessment & Plan Note (Signed)
Low cholesterol diet.  Remains on crestor.  Follow lipid profile and liver panel.   

## 2013-05-25 NOTE — Assessment & Plan Note (Signed)
Low carb diet and exercise.  Follow metabolic panel and a1c. A1c 03/11/13 - 6.1.

## 2013-05-25 NOTE — Assessment & Plan Note (Signed)
GI w/up as outlined.  Follow cbc.  

## 2013-05-25 NOTE — Assessment & Plan Note (Signed)
Followed by cardiology.  Sees Dr Darrold Junker.  Currently doing well.  Follow. Due pacemaker check next month.

## 2013-05-25 NOTE — Assessment & Plan Note (Signed)
On thyroid replacement.  Follow tsh.  

## 2013-05-25 NOTE — Assessment & Plan Note (Signed)
Blood pressure as outlined.  Same medication regimen.  Follow metabolic panel.  

## 2013-05-27 ENCOUNTER — Other Ambulatory Visit: Payer: Self-pay | Admitting: Internal Medicine

## 2013-05-27 NOTE — Telephone Encounter (Signed)
Refill completed 05/21/13. Receipt confirmed by pharmacy.

## 2013-06-16 DIAGNOSIS — I441 Atrioventricular block, second degree: Secondary | ICD-10-CM | POA: Diagnosis not present

## 2013-06-25 DIAGNOSIS — I5022 Chronic systolic (congestive) heart failure: Secondary | ICD-10-CM | POA: Diagnosis not present

## 2013-06-25 DIAGNOSIS — I519 Heart disease, unspecified: Secondary | ICD-10-CM | POA: Diagnosis not present

## 2013-06-25 DIAGNOSIS — I441 Atrioventricular block, second degree: Secondary | ICD-10-CM | POA: Diagnosis not present

## 2013-06-25 DIAGNOSIS — I2510 Atherosclerotic heart disease of native coronary artery without angina pectoris: Secondary | ICD-10-CM | POA: Diagnosis not present

## 2013-07-11 ENCOUNTER — Other Ambulatory Visit: Payer: Self-pay | Admitting: Internal Medicine

## 2013-07-16 ENCOUNTER — Other Ambulatory Visit: Payer: Self-pay | Admitting: Internal Medicine

## 2013-07-17 DIAGNOSIS — Z23 Encounter for immunization: Secondary | ICD-10-CM | POA: Diagnosis not present

## 2013-07-25 ENCOUNTER — Other Ambulatory Visit: Payer: Self-pay | Admitting: Internal Medicine

## 2013-08-13 ENCOUNTER — Ambulatory Visit: Payer: Self-pay | Admitting: Podiatrist

## 2013-09-02 ENCOUNTER — Other Ambulatory Visit (INDEPENDENT_AMBULATORY_CARE_PROVIDER_SITE_OTHER): Payer: Medicare Other

## 2013-09-02 DIAGNOSIS — I10 Essential (primary) hypertension: Secondary | ICD-10-CM | POA: Diagnosis not present

## 2013-09-02 DIAGNOSIS — D649 Anemia, unspecified: Secondary | ICD-10-CM | POA: Diagnosis not present

## 2013-09-02 DIAGNOSIS — E7800 Pure hypercholesterolemia, unspecified: Secondary | ICD-10-CM

## 2013-09-02 DIAGNOSIS — R7309 Other abnormal glucose: Secondary | ICD-10-CM | POA: Diagnosis not present

## 2013-09-02 DIAGNOSIS — R739 Hyperglycemia, unspecified: Secondary | ICD-10-CM

## 2013-09-02 LAB — LIPID PANEL
HDL: 49.6 mg/dL (ref >39.00)
LDL Cholesterol: 91 mg/dL (ref 0–99)
Total CHOL/HDL Ratio: 3
Triglycerides: 93 mg/dL (ref 0.0–149.0)

## 2013-09-02 LAB — HEPATIC FUNCTION PANEL
ALT: 11 U/L (ref 0–53)
AST: 17 U/L (ref 0–37)
Albumin: 4.4 g/dL (ref 3.5–5.2)

## 2013-09-02 LAB — CBC WITH DIFFERENTIAL/PLATELET
Basophils Absolute: 0 10*3/uL (ref 0.0–0.1)
Basophils Relative: 0.8 % (ref 0.0–3.0)
Eosinophils Absolute: 0.3 10*3/uL (ref 0.0–0.7)
Eosinophils Relative: 7.1 % — ABNORMAL HIGH (ref 0.0–5.0)
Lymphocytes Relative: 29.6 % (ref 12.0–46.0)
MCHC: 33.3 g/dL (ref 30.0–36.0)
Neutrophils Relative %: 51.9 % (ref 43.0–77.0)
RBC: 4.52 Mil/uL (ref 4.22–5.81)
RDW: 13.6 % (ref 11.5–14.6)
WBC: 3.6 10*3/uL — ABNORMAL LOW (ref 4.5–10.5)

## 2013-09-02 LAB — BASIC METABOLIC PANEL
CO2: 27 mEq/L (ref 19–32)
Calcium: 9.3 mg/dL (ref 8.4–10.5)
Creatinine, Ser: 1.1 mg/dL (ref 0.4–1.5)
Sodium: 142 mEq/L (ref 135–145)

## 2013-09-02 LAB — HEMOGLOBIN A1C: Hgb A1c MFr Bld: 6 % (ref 4.6–6.5)

## 2013-09-03 ENCOUNTER — Ambulatory Visit (INDEPENDENT_AMBULATORY_CARE_PROVIDER_SITE_OTHER): Payer: Medicare Other | Admitting: Podiatrist

## 2013-09-03 ENCOUNTER — Encounter: Payer: Self-pay | Admitting: Podiatrist

## 2013-09-03 VITALS — BP 134/81 | HR 62 | Resp 16

## 2013-09-03 DIAGNOSIS — B351 Tinea unguium: Secondary | ICD-10-CM

## 2013-09-03 DIAGNOSIS — M79609 Pain in unspecified limb: Secondary | ICD-10-CM | POA: Diagnosis not present

## 2013-09-03 NOTE — Progress Notes (Signed)
HPI:  Patient presents today for follow up of foot and nail care. Denies any new complaints today.  Objective:  Patients chart is reviewed.  Neurovascular status unchanged.  Patients nails are thickened, discolored, distrophic, friable and brittle with yellow-brown discoloration. Patient subjectively relates they are painful with shoes and with ambulation of bilateral feet.  Assessment:  Symptomatic onychomycosis  Plan:  Discussed treatment options and alternatives.  The symptomatic toenails were debrided through manual an mechanical means without complication.  Return appointment recommended at routine intervals of 3 months    Nayana Lenig, DPM   

## 2013-09-03 NOTE — Progress Notes (Deleted)
   Subjective:    Patient ID: Daniel Austin, male    DOB: 06/23/1941, 72 y.o.   MRN: [id number]  HPI Comments: Trim my toenails      Review of Systems     Objective:   Physical Exam        Assessment & Plan:

## 2013-09-10 ENCOUNTER — Ambulatory Visit (INDEPENDENT_AMBULATORY_CARE_PROVIDER_SITE_OTHER): Payer: Medicare Other | Admitting: Internal Medicine

## 2013-09-10 ENCOUNTER — Encounter: Payer: Self-pay | Admitting: Internal Medicine

## 2013-09-10 VITALS — BP 130/90 | HR 60 | Temp 97.8°F | Ht 69.5 in | Wt 208.2 lb

## 2013-09-10 DIAGNOSIS — I2510 Atherosclerotic heart disease of native coronary artery without angina pectoris: Secondary | ICD-10-CM | POA: Diagnosis not present

## 2013-09-10 DIAGNOSIS — M25562 Pain in left knee: Secondary | ICD-10-CM

## 2013-09-10 DIAGNOSIS — D72819 Decreased white blood cell count, unspecified: Secondary | ICD-10-CM

## 2013-09-10 DIAGNOSIS — E7800 Pure hypercholesterolemia, unspecified: Secondary | ICD-10-CM | POA: Diagnosis not present

## 2013-09-10 DIAGNOSIS — R7309 Other abnormal glucose: Secondary | ICD-10-CM

## 2013-09-10 DIAGNOSIS — I10 Essential (primary) hypertension: Secondary | ICD-10-CM

## 2013-09-10 DIAGNOSIS — R739 Hyperglycemia, unspecified: Secondary | ICD-10-CM

## 2013-09-10 DIAGNOSIS — I6529 Occlusion and stenosis of unspecified carotid artery: Secondary | ICD-10-CM

## 2013-09-10 DIAGNOSIS — E039 Hypothyroidism, unspecified: Secondary | ICD-10-CM

## 2013-09-10 DIAGNOSIS — D649 Anemia, unspecified: Secondary | ICD-10-CM

## 2013-09-10 DIAGNOSIS — M25569 Pain in unspecified knee: Secondary | ICD-10-CM

## 2013-09-10 MED ORDER — LORAZEPAM 1 MG PO TABS: 1.0000 mg | ORAL_TABLET | Freq: Every evening | ORAL | Status: DC | PRN

## 2013-09-10 NOTE — Progress Notes (Signed)
Subjective:    Patient ID: Daniel Austin, male    DOB: November 01, 1940, 72 y.o.   MRN: [id number]  HPI 72 year old male with past history of hypothyroidism, hypertension, hypercholesterolemia, hyperglycemia and multivessel CAD (followed by Dr Darrold Junker) who is s/p pacemaker placement for second degree heart block.  He also is s/p stent x 2 for symptomatic high grade carotid stenosis.  He comes in today for a scheduled follow up.  He states that he is doing relatively well.  Has a pacemaker.  Sees Dr Darrold Junker.  Was just evaluated a couple of months ago.  Everything checked out fine.  States he has not been checking his blood pressure regularly.  Overdue an eye check.  Sees Dr Druscilla Brownie.  Encouraged him to make an appt.  Breathing stable.  No chest pain or tightness.  Has been having trouble with his left knee.  Intermittently swells and will flare.  No significant pain now.  Some stiffness after he has been sitting for a while.     Past Medical History  Diagnosis Date  . Hypercholesterolemia   . Hypothyroidism   . Hypertension   . Fractures, multiple     Metal rod in left foot  . CAD (coronary artery disease)   . Carotid artery stenosis     high-grade symptomatic right sided s/p stent twice  . Syncope     second degree heart block s/p St Jude's pacemaker placement 12/08  . Anemia   . Ulcer of gastric fundus   . Hyperglycemia     Current Outpatient Prescriptions on File Prior to Visit  Medication Sig Dispense Refill  . Aspirin 81 MG EC tablet Take 81 mg by mouth daily.      . citalopram (CELEXA) 20 MG tablet Take 1 tablet (20 mg total) by mouth daily.  30 tablet  5  . citalopram (CELEXA) 20 MG tablet TAKE 1 TABLET (20 MG TOTAL) BY MOUTH DAILY.  90 tablet  0  . clopidogrel (PLAVIX) 75 MG tablet Take 1 tablet (75 mg total) by mouth daily.  30 tablet  5  . fexofenadine (ALLEGRA) 180 MG tablet Take 180 mg by mouth daily.      Marland Kitchen levothyroxine (SYNTHROID, LEVOTHROID) 112 MCG tablet Take 1 tablet  (112 mcg total) by mouth daily.  30 tablet  5  . lisinopril (PRINIVIL,ZESTRIL) 40 MG tablet Take 1 tablet (40 mg total) by mouth daily.  30 tablet  5  . LORazepam (ATIVAN) 1 MG tablet TAKE 1 TABLET BY MOUTH AT BEDTIME AS NEEDED  30 tablet  1  . metoprolol tartrate (LOPRESSOR) 25 MG tablet Take 1 tablet (25 mg total) by mouth daily.  90 tablet  1  . naproxen sodium (ANAPROX) 220 MG tablet Take 220 mg by mouth as needed (knee pain).      . nitroGLYCERIN (NITROSTAT) 0.4 MG SL tablet Place 0.4 mg under the tongue every 5 (five) minutes as needed. If a third tablet is needed please call 911.      . pantoprazole (PROTONIX) 40 MG tablet Take 1 tablet (40 mg total) by mouth daily.  30 tablet  5  . rosuvastatin (CRESTOR) 10 MG tablet Take one tablet alternating with two tablets q od  45 tablet  5   No current facility-administered medications on file prior to visit.    Review of Systems Patient denies any headache, lightheadedness or dizziness.  No syncope or near syncopal episodes  No chest pain, tightness or palpitations.   No  increased shortness of breath, cough or congestion.  No nausea or vomiting.  No acid reflux. No abdominal pain or cramping.  No bowel change, such as diarrhea, constipation, BRBPR or melana.  No urine change.  Stays active.  Knee pain and swelling as outlined.       Objective:   Physical Exam   Filed Vitals:   09/10/13 0753  BP: 130/90  Pulse: 60  Temp: 97.8 F (36.6 C)   Blood pressure recheck:  132/84, pulse 4  72 year old male in no acute distress.  HEENT:  Nares - clear.  Oropharynx - without lesions. NECK:  Supple.  Nontender.   HEART:  Appears to be regular.   LUNGS:  No crackles or wheezing audible.  Respirations even and unlabored.   RADIAL PULSE:  Equal bilaterally.  ABDOMEN:  Soft.  Nontender.  Bowel sounds present and normal.  No audible abdominal bruit.    EXTREMITIES:  No increased edema present.  DP pulses palpable and equal bilaterally.           Assessment & Plan:  HEALTH MAINTENANCE.  Physical 11/04/12.  PSA 03/11/13 - .38.  Colonoscopy 12/20/10 - diverticulosis.  Recommended repeat colonoscopy - five years.

## 2013-09-10 NOTE — Progress Notes (Signed)
Pre-visit discussion using our clinic review tool. No additional management support is needed unless otherwise documented below in the visit note.  

## 2013-09-13 ENCOUNTER — Encounter: Payer: Self-pay | Admitting: Internal Medicine

## 2013-09-13 DIAGNOSIS — M25562 Pain in left knee: Secondary | ICD-10-CM | POA: Insufficient documentation

## 2013-09-13 NOTE — Assessment & Plan Note (Signed)
Blood pressure as outlined.  Same medication regimen.  Follow metabolic panel.  

## 2013-09-13 NOTE — Assessment & Plan Note (Signed)
On thyroid replacement.  Follow tsh.  

## 2013-09-13 NOTE — Assessment & Plan Note (Signed)
GI w/up as outlined.  Follow cbc.  

## 2013-09-13 NOTE — Assessment & Plan Note (Signed)
Intermittent pain as outlined.  Discussed further w/up.  He declines.  Follow.

## 2013-09-13 NOTE — Assessment & Plan Note (Signed)
Low cholesterol diet.  Remains on crestor.  Follow lipid profile and liver panel.   

## 2013-09-13 NOTE — Assessment & Plan Note (Signed)
Low carb diet and exercise.  Follow metabolic panel and a1c.   

## 2013-09-13 NOTE — Assessment & Plan Note (Signed)
Followed by cardiology.  Sees Dr Paraschos.  Currently doing well.  Follow.  Pacemaker in place.  Follow.   

## 2013-09-13 NOTE — Assessment & Plan Note (Signed)
Has high grade stenosis.  S/p stent x 2.  Sees Dr Dew.  Continue risk factor modification.   

## 2013-09-25 DIAGNOSIS — I10 Essential (primary) hypertension: Secondary | ICD-10-CM | POA: Diagnosis not present

## 2013-09-25 DIAGNOSIS — I6529 Occlusion and stenosis of unspecified carotid artery: Secondary | ICD-10-CM | POA: Diagnosis not present

## 2013-10-12 ENCOUNTER — Other Ambulatory Visit (INDEPENDENT_AMBULATORY_CARE_PROVIDER_SITE_OTHER): Payer: Medicare Other

## 2013-10-12 DIAGNOSIS — D72819 Decreased white blood cell count, unspecified: Secondary | ICD-10-CM

## 2013-10-13 LAB — CBC WITH DIFFERENTIAL/PLATELET
BASOS PCT: 0.9 % (ref 0.0–3.0)
Basophils Absolute: 0.1 10*3/uL (ref 0.0–0.1)
EOS PCT: 4 % (ref 0.0–5.0)
Eosinophils Absolute: 0.2 10*3/uL (ref 0.0–0.7)
HEMATOCRIT: 39.2 % (ref 39.0–52.0)
Hemoglobin: 13.1 g/dL (ref 13.0–17.0)
Lymphocytes Relative: 28.7 % (ref 12.0–46.0)
Lymphs Abs: 1.7 10*3/uL (ref 0.7–4.0)
MCHC: 33.4 g/dL (ref 30.0–36.0)
MCV: 87.1 fl (ref 78.0–100.0)
MONO ABS: 0.5 10*3/uL (ref 0.1–1.0)
Monocytes Relative: 8.7 % (ref 3.0–12.0)
Neutro Abs: 3.5 10*3/uL (ref 1.4–7.7)
Neutrophils Relative %: 57.7 % (ref 43.0–77.0)
Platelets: 243 10*3/uL (ref 150.0–400.0)
RBC: 4.5 Mil/uL (ref 4.22–5.81)
RDW: 13.8 % (ref 11.5–14.6)
WBC: 6 10*3/uL (ref 4.5–10.5)

## 2013-10-14 ENCOUNTER — Encounter: Payer: Self-pay | Admitting: *Deleted

## 2013-10-17 ENCOUNTER — Other Ambulatory Visit: Payer: Self-pay | Admitting: Internal Medicine

## 2013-10-26 ENCOUNTER — Encounter: Payer: Self-pay | Admitting: Internal Medicine

## 2013-10-26 ENCOUNTER — Ambulatory Visit (INDEPENDENT_AMBULATORY_CARE_PROVIDER_SITE_OTHER): Payer: Medicare Other | Admitting: Internal Medicine

## 2013-10-26 VITALS — BP 132/80 | HR 63 | Temp 98.1°F | Ht 69.5 in | Wt 208.5 lb

## 2013-10-26 DIAGNOSIS — M25562 Pain in left knee: Secondary | ICD-10-CM

## 2013-10-26 DIAGNOSIS — E7800 Pure hypercholesterolemia, unspecified: Secondary | ICD-10-CM | POA: Diagnosis not present

## 2013-10-26 DIAGNOSIS — R1310 Dysphagia, unspecified: Secondary | ICD-10-CM | POA: Diagnosis not present

## 2013-10-26 DIAGNOSIS — E039 Hypothyroidism, unspecified: Secondary | ICD-10-CM

## 2013-10-26 DIAGNOSIS — D649 Anemia, unspecified: Secondary | ICD-10-CM

## 2013-10-26 DIAGNOSIS — R7309 Other abnormal glucose: Secondary | ICD-10-CM

## 2013-10-26 DIAGNOSIS — I6529 Occlusion and stenosis of unspecified carotid artery: Secondary | ICD-10-CM

## 2013-10-26 DIAGNOSIS — I2510 Atherosclerotic heart disease of native coronary artery without angina pectoris: Secondary | ICD-10-CM

## 2013-10-26 DIAGNOSIS — I10 Essential (primary) hypertension: Secondary | ICD-10-CM

## 2013-10-26 DIAGNOSIS — M25569 Pain in unspecified knee: Secondary | ICD-10-CM

## 2013-10-26 DIAGNOSIS — R739 Hyperglycemia, unspecified: Secondary | ICD-10-CM

## 2013-10-26 MED ORDER — LORAZEPAM 1 MG PO TABS: 1.0000 mg | ORAL_TABLET | Freq: Every evening | ORAL | Status: DC | PRN

## 2013-10-26 MED ORDER — LEVOTHYROXINE SODIUM 112 MCG PO TABS: 112.0000 ug | ORAL_TABLET | Freq: Every day | ORAL | Status: DC

## 2013-10-26 MED ORDER — LISINOPRIL 40 MG PO TABS: 40.0000 mg | ORAL_TABLET | Freq: Every day | ORAL | Status: DC

## 2013-10-26 MED ORDER — CLOPIDOGREL BISULFATE 75 MG PO TABS: 75.0000 mg | ORAL_TABLET | Freq: Every day | ORAL | Status: DC

## 2013-10-26 MED ORDER — METOPROLOL TARTRATE 25 MG PO TABS: 25.0000 mg | ORAL_TABLET | Freq: Every day | ORAL | Status: DC

## 2013-10-26 MED ORDER — CITALOPRAM HYDROBROMIDE 20 MG PO TABS: 20.0000 mg | ORAL_TABLET | Freq: Every day | ORAL | Status: DC

## 2013-10-26 MED ORDER — PANTOPRAZOLE SODIUM 40 MG PO TBEC: 40.0000 mg | DELAYED_RELEASE_TABLET | Freq: Every day | ORAL | Status: DC

## 2013-10-26 NOTE — Progress Notes (Signed)
  Subjective:    Patient ID: Daniel Austin, male    DOB: 07/08/1941, 73 y.o.   MRN: [id number]  HPI 73 year old male with past history of hypothyroidism, hypertension, hypercholesterolemia, hyperglycemia and multivessel CAD (followed by Dr Saralyn Pilar) who is s/p pacemaker placement for second degree heart block.  He also is s/p stent x 2 for symptomatic high grade carotid stenosis.  He comes in today for a scheduled follow up.  He states that he is doing relatively well.  Has a pacemaker.  Sees Dr Saralyn Pilar.  Everything stable.  Breathing stable.  No chest pain or tightness.   Past Medical History  Diagnosis Date  . Hypercholesterolemia   . Hypothyroidism   . Hypertension   . Fractures, multiple     Metal rod in left foot  . CAD (coronary artery disease)   . Carotid artery stenosis     high-grade symptomatic right sided s/p stent twice  . Syncope     second degree heart block s/p St Jude's pacemaker placement 12/08  . Anemia   . Ulcer of gastric fundus   . Hyperglycemia     Current Outpatient Prescriptions on File Prior to Visit  Medication Sig Dispense Refill  . Aspirin 81 MG EC tablet Take 81 mg by mouth daily.      . fexofenadine (ALLEGRA) 180 MG tablet Take 180 mg by mouth daily.      . naproxen sodium (ANAPROX) 220 MG tablet Take 220 mg by mouth as needed (knee pain).      . nitroGLYCERIN (NITROSTAT) 0.4 MG SL tablet Place 0.4 mg under the tongue every 5 (five) minutes as needed. If a third tablet is needed please call 911.       No current facility-administered medications on file prior to visit.    Review of Systems Patient denies any headache, lightheadedness or dizziness.  No syncope or near syncopal episodes  No chest pain, tightness or palpitations.   No increased shortness of breath, cough or congestion.  No nausea or vomiting.  No acid reflux. No abdominal pain or cramping.  No bowel change, such as diarrhea, constipation, BRBPR or melana.  No urine change.  Stays  active.       Objective:   Physical Exam   Filed Vitals:   10/26/13 0808  BP: 132/80  Pulse: 63  Temp: 98.1 F (36.7 C)   Blood pressure recheck:  60/48  73 year old male in no acute distress.  HEENT:  Nares - clear.  Oropharynx - without lesions. NECK:  Supple.  Nontender.   HEART:  Appears to be regular.   LUNGS:  No crackles or wheezing audible.  Respirations even and unlabored.   RADIAL PULSE:  Equal bilaterally.  ABDOMEN:  Soft.  Nontender.  Bowel sounds present and normal.  No audible abdominal bruit.    EXTREMITIES:  No increased edema present.  DP pulses palpable and equal bilaterally.          Assessment & Plan:  HEALTH MAINTENANCE.  Physical 11/04/12.  PSA 03/11/13 - .38.  Colonoscopy 12/20/10 - diverticulosis.  Recommended repeat colonoscopy - five years.

## 2013-10-26 NOTE — Progress Notes (Signed)
Pre-visit discussion using our clinic review tool. No additional management support is needed unless otherwise documented below in the visit note.  

## 2013-11-01 ENCOUNTER — Encounter: Payer: Self-pay | Admitting: Internal Medicine

## 2013-11-01 NOTE — Assessment & Plan Note (Signed)
Low cholesterol diet.  Remains on crestor.  Follow lipid profile and liver panel.   

## 2013-11-01 NOTE — Assessment & Plan Note (Signed)
GI w/up as outlined.  Follow cbc.  

## 2013-11-01 NOTE — Assessment & Plan Note (Signed)
Blood pressure as outlined.  Same medication regimen.  Follow metabolic panel.  

## 2013-11-01 NOTE — Assessment & Plan Note (Signed)
No problems swallowing now.  Follow.   ?

## 2013-11-01 NOTE — Assessment & Plan Note (Signed)
Followed by cardiology.  Sees Dr Paraschos.  Currently doing well.  Follow.  Pacemaker in place.  Follow.   

## 2013-11-01 NOTE — Assessment & Plan Note (Signed)
No problems reported today.  Follow.   

## 2013-11-01 NOTE — Assessment & Plan Note (Signed)
Low carb diet and exercise.  Follow metabolic panel and K0X.  A1c 6.0 - 09/02/13.

## 2013-11-01 NOTE — Assessment & Plan Note (Signed)
Has high grade stenosis.  S/p stent x 2.  Sees Dr Lucky Cowboy.  Continue risk factor modification.

## 2013-11-01 NOTE — Assessment & Plan Note (Signed)
On thyroid replacement.  Follow tsh.  

## 2013-11-21 ENCOUNTER — Other Ambulatory Visit: Payer: Self-pay | Admitting: Internal Medicine

## 2013-12-03 ENCOUNTER — Ambulatory Visit (INDEPENDENT_AMBULATORY_CARE_PROVIDER_SITE_OTHER): Payer: Medicare Other | Admitting: Podiatrist

## 2013-12-03 VITALS — BP 116/62 | HR 72 | Resp 16 | Ht 71.0 in | Wt 200.0 lb

## 2013-12-03 DIAGNOSIS — B351 Tinea unguium: Secondary | ICD-10-CM | POA: Diagnosis not present

## 2013-12-03 DIAGNOSIS — M79609 Pain in unspecified limb: Secondary | ICD-10-CM

## 2013-12-03 NOTE — Progress Notes (Signed)
HPI:  Patient presents today for follow up of foot and nail care. Denies any new complaints today.  Objective:  Patients chart is reviewed.  Vascular status reveals pedal pulses noted at 2 out of 4 dp and pt bilateral .  Neurological sensation is Normal to Semmes Weinstein monofilament bilateral.  Patients nails are thickened, discolored, distrophic, friable and brittle with yellow-brown discoloration. Patient subjectively relates they are painful with shoes and with ambulation of bilateral feet.  Assessment:  Symptomatic onychomycosis  Plan:  Discussed treatment options and alternatives.  The symptomatic toenails were debrided through manual an mechanical means without complication.  Return appointment recommended at routine intervals of 3 months    Riannon Mukherjee, DPM   

## 2013-12-15 DIAGNOSIS — I5022 Chronic systolic (congestive) heart failure: Secondary | ICD-10-CM | POA: Diagnosis not present

## 2013-12-15 DIAGNOSIS — I428 Other cardiomyopathies: Secondary | ICD-10-CM | POA: Diagnosis not present

## 2013-12-15 DIAGNOSIS — I4729 Other ventricular tachycardia: Secondary | ICD-10-CM | POA: Diagnosis not present

## 2013-12-15 DIAGNOSIS — I472 Ventricular tachycardia: Secondary | ICD-10-CM | POA: Diagnosis not present

## 2013-12-15 DIAGNOSIS — I2510 Atherosclerotic heart disease of native coronary artery without angina pectoris: Secondary | ICD-10-CM | POA: Diagnosis not present

## 2013-12-16 ENCOUNTER — Other Ambulatory Visit: Payer: Self-pay | Admitting: Internal Medicine

## 2013-12-31 ENCOUNTER — Other Ambulatory Visit (INDEPENDENT_AMBULATORY_CARE_PROVIDER_SITE_OTHER): Payer: Medicare Other

## 2013-12-31 DIAGNOSIS — R7309 Other abnormal glucose: Secondary | ICD-10-CM

## 2013-12-31 DIAGNOSIS — E7800 Pure hypercholesterolemia, unspecified: Secondary | ICD-10-CM

## 2013-12-31 DIAGNOSIS — I10 Essential (primary) hypertension: Secondary | ICD-10-CM | POA: Diagnosis not present

## 2013-12-31 DIAGNOSIS — R739 Hyperglycemia, unspecified: Secondary | ICD-10-CM

## 2013-12-31 LAB — HEPATIC FUNCTION PANEL
ALK PHOS: 85 U/L (ref 39–117)
ALT: 12 U/L (ref 0–53)
AST: 16 U/L (ref 0–37)
Albumin: 3.9 g/dL (ref 3.5–5.2)
BILIRUBIN TOTAL: 1.1 mg/dL (ref 0.3–1.2)
Bilirubin, Direct: 0.1 mg/dL (ref 0.0–0.3)
Total Protein: 7 g/dL (ref 6.0–8.3)

## 2013-12-31 LAB — BASIC METABOLIC PANEL
BUN: 12 mg/dL (ref 6–23)
CO2: 26 meq/L (ref 19–32)
Calcium: 9.1 mg/dL (ref 8.4–10.5)
Chloride: 102 mEq/L (ref 96–112)
Creatinine, Ser: 0.9 mg/dL (ref 0.4–1.5)
GFR: 91.26 mL/min (ref >60.00)
Glucose, Bld: 88 mg/dL (ref 70–99)
POTASSIUM: 4.4 meq/L (ref 3.5–5.1)
Sodium: 137 mEq/L (ref 135–145)

## 2013-12-31 LAB — HEMOGLOBIN A1C: HEMOGLOBIN A1C: 6.1 % (ref 4.6–6.5)

## 2013-12-31 LAB — LIPID PANEL
Cholesterol: 154 mg/dL (ref 0–200)
HDL: 43.4 mg/dL (ref >39.00)
LDL CALC: 96 mg/dL (ref 0–99)
Total CHOL/HDL Ratio: 4
Triglycerides: 71 mg/dL (ref 0.0–149.0)
VLDL: 14.2 mg/dL (ref 0.0–40.0)

## 2014-01-04 ENCOUNTER — Encounter: Payer: Self-pay | Admitting: Internal Medicine

## 2014-01-04 ENCOUNTER — Ambulatory Visit (INDEPENDENT_AMBULATORY_CARE_PROVIDER_SITE_OTHER): Payer: Medicare Other | Admitting: Internal Medicine

## 2014-01-04 VITALS — BP 118/90 | HR 61 | Temp 98.0°F | Ht 69.0 in | Wt 209.2 lb

## 2014-01-04 DIAGNOSIS — M25562 Pain in left knee: Secondary | ICD-10-CM

## 2014-01-04 DIAGNOSIS — H539 Unspecified visual disturbance: Secondary | ICD-10-CM | POA: Diagnosis not present

## 2014-01-04 DIAGNOSIS — R739 Hyperglycemia, unspecified: Secondary | ICD-10-CM

## 2014-01-04 DIAGNOSIS — I2510 Atherosclerotic heart disease of native coronary artery without angina pectoris: Secondary | ICD-10-CM | POA: Diagnosis not present

## 2014-01-04 DIAGNOSIS — I10 Essential (primary) hypertension: Secondary | ICD-10-CM

## 2014-01-04 DIAGNOSIS — E7800 Pure hypercholesterolemia, unspecified: Secondary | ICD-10-CM | POA: Diagnosis not present

## 2014-01-04 DIAGNOSIS — E039 Hypothyroidism, unspecified: Secondary | ICD-10-CM

## 2014-01-04 DIAGNOSIS — M25569 Pain in unspecified knee: Secondary | ICD-10-CM

## 2014-01-04 DIAGNOSIS — I6529 Occlusion and stenosis of unspecified carotid artery: Secondary | ICD-10-CM

## 2014-01-04 DIAGNOSIS — Z79899 Other long term (current) drug therapy: Secondary | ICD-10-CM | POA: Diagnosis not present

## 2014-01-04 DIAGNOSIS — D649 Anemia, unspecified: Secondary | ICD-10-CM

## 2014-01-04 DIAGNOSIS — R7309 Other abnormal glucose: Secondary | ICD-10-CM

## 2014-01-04 DIAGNOSIS — M25561 Pain in right knee: Secondary | ICD-10-CM

## 2014-01-04 MED ORDER — METOPROLOL TARTRATE 25 MG PO TABS: 25.0000 mg | ORAL_TABLET | Freq: Two times a day (BID) | ORAL | Status: DC

## 2014-01-04 MED ORDER — LORAZEPAM 1 MG PO TABS: 1.0000 mg | ORAL_TABLET | Freq: Every evening | ORAL | Status: DC | PRN

## 2014-01-04 MED ORDER — CITALOPRAM HYDROBROMIDE 20 MG PO TABS: 20.0000 mg | ORAL_TABLET | Freq: Every day | ORAL | Status: DC

## 2014-01-04 NOTE — Progress Notes (Signed)
Pre-visit discussion using our clinic review tool. No additional management support is needed unless otherwise documented below in the visit note.  

## 2014-01-04 NOTE — Progress Notes (Signed)
Subjective:    Patient ID: Daniel Austin, male    DOB: 25-May-1941, 73 y.o.   MRN: [id number]  HPI 73 year old male with past history of hypothyroidism, hypertension, hypercholesterolemia, hyperglycemia and multivessel CAD (followed by Dr Saralyn Pilar) who is s/p pacemaker placement for second degree heart block.  He also is s/p stent x 2 for symptomatic high grade carotid stenosis.  He comes in today to follow up on these issues as well as for a complete physical exam.  He states that he is doing relatively well.  Has a pacemaker.  Sees Dr Saralyn Pilar.  Everything stable.  Breathing stable.  No chest pain or tightness.  He does report an episode of "blurry vision".  States eyes just are hazy.  No actual vision loss.  Just appear things are floating.  No headache or dizziness.  Only lasted a few seconds.  Dr Lucky Cowboy is following his carotids.  Just checked a few months ago.  Stable.  On aspirin and plavix.  He reports no chest pain or tightness.  No sob.  He does state that his blood pressure has been elevated in the evening.  He is only taking the lopressor q day instead of bid.  Eating and drinking well.  No nausea or vomiting.  No bowel change.  Also reports bilateral knee pain.  Some increased pain and discomfort.     Past Medical History  Diagnosis Date  . Hypercholesterolemia   . Hypothyroidism   . Hypertension   . Fractures, multiple     Metal rod in left foot  . CAD (coronary artery disease)   . Carotid artery stenosis     high-grade symptomatic right sided s/p stent twice  . Syncope     second degree heart block s/p St Jude's pacemaker placement 12/08  . Anemia   . Ulcer of gastric fundus   . Hyperglycemia     Current Outpatient Prescriptions on File Prior to Visit  Medication Sig Dispense Refill  . Aspirin 81 MG EC tablet Take 81 mg by mouth daily.      . citalopram (CELEXA) 20 MG tablet Take 1 tablet (20 mg total) by mouth daily.  30 tablet  2  . clopidogrel (PLAVIX) 75 MG tablet Take  1 tablet (75 mg total) by mouth daily.  30 tablet  5  . CRESTOR 10 MG tablet TAKE 1 TABLET (10 MG TOTAL) BY MOUTH DAILY.  30 tablet  5  . fexofenadine (ALLEGRA) 180 MG tablet Take 180 mg by mouth daily.      Marland Kitchen levothyroxine (SYNTHROID, LEVOTHROID) 112 MCG tablet Take 1 tablet (112 mcg total) by mouth daily.  30 tablet  5  . lisinopril (PRINIVIL,ZESTRIL) 40 MG tablet Take 1 tablet (40 mg total) by mouth daily.  30 tablet  5  . LORazepam (ATIVAN) 1 MG tablet Take 1 tablet (1 mg total) by mouth at bedtime as needed for anxiety.  30 tablet  2  . metoprolol tartrate (LOPRESSOR) 25 MG tablet Take 1 tablet (25 mg total) by mouth daily.  90 tablet  1  . naproxen sodium (ANAPROX) 220 MG tablet Take 220 mg by mouth as needed (knee pain).      . nitroGLYCERIN (NITROSTAT) 0.4 MG SL tablet Place 0.4 mg under the tongue every 5 (five) minutes as needed. If a third tablet is needed please call 911.      . pantoprazole (PROTONIX) 40 MG tablet Take 1 tablet (40 mg total) by mouth daily.  [place]  tablet  5   No current facility-administered medications on file prior to visit.    Review of Systems Patient denies any headache, lightheadedness or dizziness.  Vision change as outlined.  No vision loss.  No syncope or near syncopal episodes.  No chest pain, tightness or palpitations.   No increased shortness of breath, cough or congestion.  No nausea or vomiting.  No acid reflux. No abdominal pain or cramping.  No bowel change, such as diarrhea, constipation, BRBPR or melana.  No urine change.  Stays active.  Knee pains as outlined.  Blood pressure as outlined.       Objective:   Physical Exam   Filed Vitals:   01/04/14 0941  BP: 118/90  Pulse: 61  Temp: 98 F (36.7 C)   Blood pressure recheck:  21/98-59  73 year old male in no acute distress.  HEENT:  Nares - clear.  Oropharynx - without lesions. NECK:  Supple.  Nontender.  No audible carotid bruit.  HEART:  Appears to be regular.   LUNGS:  No crackles or  wheezing audible.  Respirations even and unlabored.   RADIAL PULSE:  Equal bilaterally.  ABDOMEN:  Soft.  Nontender.  Bowel sounds present and normal.  No audible abdominal bruit.  GU:  Normal descended testicles.  No palpable testicular nodules.   RECTAL:  Could not appreciate any palpable prostate nodules.  Heme negative.   EXTREMITIES:  No increased edema present.  DP pulses palpable and equal bilaterally.         Assessment & Plan:  HEALTH MAINTENANCE.  Physical today.  PSA 03/11/13 - .38.  Colonoscopy 12/20/10 - diverticulosis.  Recommended repeat colonoscopy - five years.    I spent 25 minutes with the patient and more than 50% of the time was spent in consultation regarding the above.

## 2014-01-06 DIAGNOSIS — H53009 Unspecified amblyopia, unspecified eye: Secondary | ICD-10-CM | POA: Diagnosis not present

## 2014-01-09 ENCOUNTER — Encounter: Payer: Self-pay | Admitting: Internal Medicine

## 2014-01-09 DIAGNOSIS — M25561 Pain in right knee: Secondary | ICD-10-CM | POA: Insufficient documentation

## 2014-01-09 DIAGNOSIS — H539 Unspecified visual disturbance: Secondary | ICD-10-CM | POA: Insufficient documentation

## 2014-01-09 DIAGNOSIS — M25569 Pain in unspecified knee: Secondary | ICD-10-CM | POA: Insufficient documentation

## 2014-01-09 DIAGNOSIS — M25562 Pain in left knee: Secondary | ICD-10-CM

## 2014-01-09 NOTE — Assessment & Plan Note (Signed)
Brief episode as outlined.  Has occurred on two separate occasions in the last 6 months.  Unclear etiology.  Just had his carotids evaluated and stable.  Sees Dr Lucky Cowboy.  On aspirin and plavix.  Continue.  Will refer to neurology for further evaluation.  Will also have opthalmology evaluate.  Follow closely.

## 2014-01-09 NOTE — Assessment & Plan Note (Signed)
Low carb diet and exercise.  Follow metabolic panel and a1c.   

## 2014-01-09 NOTE — Assessment & Plan Note (Signed)
Persistent.  Will have ortho evaluate.  Unable to take antiinflammatories.

## 2014-01-09 NOTE — Assessment & Plan Note (Signed)
Persistent knee pain.  Limits activity at times.  Refer to ortho for further evaluation and treatment.

## 2014-01-09 NOTE — Assessment & Plan Note (Signed)
GI w/up as outlined.  Follow cbc.  

## 2014-01-09 NOTE — Assessment & Plan Note (Addendum)
Blood pressure as outlined.  Elevated.  Increase lopressor to bid.  Follow.   Follow metabolic panel.  Have him spot check his pressure.  Get him back in soon to reassess.

## 2014-01-09 NOTE — Assessment & Plan Note (Signed)
On thyroid replacement.  Follow tsh.  

## 2014-01-09 NOTE — Assessment & Plan Note (Signed)
Has high grade stenosis.  S/p stent x 2.  Sees Dr Lucky Cowboy.  Continue risk factor modification.  Just check recently and stable.  Follow.

## 2014-01-09 NOTE — Assessment & Plan Note (Signed)
Followed by cardiology.  Sees Dr Paraschos.  Currently doing well.  Follow.  Pacemaker in place.  Follow.   

## 2014-01-09 NOTE — Assessment & Plan Note (Signed)
Low cholesterol diet.  Remains on crestor.  Follow lipid profile and liver panel.   

## 2014-01-12 DIAGNOSIS — M1710 Unilateral primary osteoarthritis, unspecified knee: Secondary | ICD-10-CM | POA: Diagnosis not present

## 2014-01-12 DIAGNOSIS — IMO0002 Reserved for concepts with insufficient information to code with codable children: Secondary | ICD-10-CM | POA: Diagnosis not present

## 2014-01-21 DIAGNOSIS — H538 Other visual disturbances: Secondary | ICD-10-CM | POA: Diagnosis not present

## 2014-01-21 DIAGNOSIS — IMO0002 Reserved for concepts with insufficient information to code with codable children: Secondary | ICD-10-CM | POA: Diagnosis not present

## 2014-01-21 DIAGNOSIS — H539 Unspecified visual disturbance: Secondary | ICD-10-CM | POA: Diagnosis not present

## 2014-01-21 DIAGNOSIS — M1710 Unilateral primary osteoarthritis, unspecified knee: Secondary | ICD-10-CM | POA: Diagnosis not present

## 2014-02-25 ENCOUNTER — Encounter: Payer: Self-pay | Admitting: Internal Medicine

## 2014-02-25 ENCOUNTER — Encounter (INDEPENDENT_AMBULATORY_CARE_PROVIDER_SITE_OTHER): Payer: Self-pay

## 2014-02-25 ENCOUNTER — Ambulatory Visit (INDEPENDENT_AMBULATORY_CARE_PROVIDER_SITE_OTHER): Payer: Medicare Other | Admitting: Internal Medicine

## 2014-02-25 VITALS — BP 110/80 | HR 65 | Temp 97.9°F | Ht 69.0 in | Wt 210.5 lb

## 2014-02-25 DIAGNOSIS — R739 Hyperglycemia, unspecified: Secondary | ICD-10-CM

## 2014-02-25 DIAGNOSIS — D649 Anemia, unspecified: Secondary | ICD-10-CM

## 2014-02-25 DIAGNOSIS — E039 Hypothyroidism, unspecified: Secondary | ICD-10-CM | POA: Diagnosis not present

## 2014-02-25 DIAGNOSIS — M25561 Pain in right knee: Secondary | ICD-10-CM

## 2014-02-25 DIAGNOSIS — M25562 Pain in left knee: Secondary | ICD-10-CM

## 2014-02-25 DIAGNOSIS — R7309 Other abnormal glucose: Secondary | ICD-10-CM

## 2014-02-25 DIAGNOSIS — I6529 Occlusion and stenosis of unspecified carotid artery: Secondary | ICD-10-CM

## 2014-02-25 DIAGNOSIS — H539 Unspecified visual disturbance: Secondary | ICD-10-CM | POA: Diagnosis not present

## 2014-02-25 DIAGNOSIS — I10 Essential (primary) hypertension: Secondary | ICD-10-CM

## 2014-02-25 DIAGNOSIS — E7800 Pure hypercholesterolemia, unspecified: Secondary | ICD-10-CM | POA: Diagnosis not present

## 2014-02-25 DIAGNOSIS — M25569 Pain in unspecified knee: Secondary | ICD-10-CM

## 2014-02-25 DIAGNOSIS — I2510 Atherosclerotic heart disease of native coronary artery without angina pectoris: Secondary | ICD-10-CM

## 2014-02-25 NOTE — Progress Notes (Signed)
Pre visit review using our clinic review tool, if applicable. No additional management support is needed unless otherwise documented below in the visit note. 

## 2014-02-28 ENCOUNTER — Encounter: Payer: Self-pay | Admitting: Internal Medicine

## 2014-02-28 NOTE — Progress Notes (Signed)
Subjective:    Patient ID: Daniel Austin, male    DOB: 18-Jan-1941, 73 y.o.   MRN: [id number]  HPI 73 year old male with past history of hypothyroidism, hypertension, hypercholesterolemia, hyperglycemia and multivessel CAD (followed by Dr Saralyn Pilar) who is s/p pacemaker placement for second degree heart block.  He also is s/p stent x 2 for symptomatic high grade carotid stenosis.  He comes in today for a scheduled follow up.   He states that he is doing relatively well.  Has a pacemaker.  Sees Dr Saralyn Pilar.  Everything stable.  Breathing stable.  No chest pain or tightness.  Last visit, he reported an episode of "blurry vision".  No actual vision loss.  No headache or dizziness.  Only lasted a few seconds.  Dr Lucky Cowboy is following his carotids.  Just checked a few months ago.  Stable.  On aspirin and plavix.  He saw Dr Melrose Nakayama.  Made no change in his medications.  Felt no further work up warranted.  He reports no chest pain or tightness.  No sob.  His blood pressure has been doing well.  On some days running low.  He has adjusted his metoprolol and is now taking one whole tablet in the am and 1/2 in the pm.  Blood pressures attached.  Eating and drinking well.  No nausea or vomiting.  No bowel change.  Had injection in his knee.  S/p injection.  Pain not as intense.     Past Medical History  Diagnosis Date  . Hypercholesterolemia   . Hypothyroidism   . Hypertension   . Fractures, multiple     Metal rod in left foot  . CAD (coronary artery disease)   . Carotid artery stenosis     high-grade symptomatic right sided s/p stent twice  . Syncope     second degree heart block s/p St Jude's pacemaker placement 12/08  . Anemia   . Ulcer of gastric fundus   . Hyperglycemia     Current Outpatient Prescriptions on File Prior to Visit  Medication Sig Dispense Refill  . Aspirin 81 MG EC tablet Take 81 mg by mouth daily.      . citalopram (CELEXA) 20 MG tablet Take 1 tablet (20 mg total) by mouth daily.  90  tablet  1  . clopidogrel (PLAVIX) 75 MG tablet Take 1 tablet (75 mg total) by mouth daily.  30 tablet  5  . CRESTOR 10 MG tablet TAKE 1 TABLET (10 MG TOTAL) BY MOUTH DAILY.  30 tablet  5  . fexofenadine (ALLEGRA) 180 MG tablet Take 180 mg by mouth daily.      Marland Kitchen levothyroxine (SYNTHROID, LEVOTHROID) 112 MCG tablet Take 1 tablet (112 mcg total) by mouth daily.  30 tablet  5  . lisinopril (PRINIVIL,ZESTRIL) 40 MG tablet Take 1 tablet (40 mg total) by mouth daily.  30 tablet  5  . LORazepam (ATIVAN) 1 MG tablet Take 1 tablet (1 mg total) by mouth at bedtime as needed for anxiety.  30 tablet  2  . metoprolol tartrate (LOPRESSOR) 25 MG tablet Take 1 tablet (25 mg total) by mouth 2 (two) times daily.  180 tablet  3  . naproxen sodium (ANAPROX) 220 MG tablet Take 220 mg by mouth as needed (knee pain).      . nitroGLYCERIN (NITROSTAT) 0.4 MG SL tablet Place 0.4 mg under the tongue every 5 (five) minutes as needed. If a third tablet is needed please call 911.      Marland Kitchen  pantoprazole (PROTONIX) 40 MG tablet Take 1 tablet (40 mg total) by mouth daily.  30 tablet  5   No current facility-administered medications on file prior to visit.    Review of Systems Patient denies any headache, lightheadedness or dizziness.  Previous vision change as outlined.  No vision loss.  No further vision change since last visit.  No syncope or near syncopal episodes.  No chest pain, tightness or palpitations.   No increased shortness of breath, cough or congestion.  No nausea or vomiting.  No acid reflux. No abdominal pain or cramping.  No bowel change, such as diarrhea, constipation, BRBPR or melana.  No urine change.  Stays active.  Knee pain not as intense.  Blood pressure as outlined.        Objective:   Physical Exam   Filed Vitals:   02/25/14 0855  BP: 110/80  Pulse: 65  Temp: 97.9 F (36.6 C)   Blood pressure recheck:  82/84  73 year old male in no acute distress.  HEENT:  Nares - clear.  Oropharynx - without  lesions. NECK:  Supple.  Nontender.  No audible carotid bruit.  HEART:  Appears to be regular.   LUNGS:  No crackles or wheezing audible.  Respirations even and unlabored.   RADIAL PULSE:  Equal bilaterally.  ABDOMEN:  Soft.  Nontender.  Bowel sounds present and normal.  No audible abdominal bruit.   EXTREMITIES:  No increased edema present.  DP pulses palpable and equal bilaterally.         Assessment & Plan:  HEALTH MAINTENANCE.  Physical 01/04/14.  PSA 03/11/13 - .38.  Colonoscopy 12/20/10 - diverticulosis.  Recommended repeat colonoscopy - five years.    I spent 25 minutes with the patient and more than 50% of the time was spent in consultation regarding the above.

## 2014-02-28 NOTE — Assessment & Plan Note (Signed)
Brief episode previously as outlined.  Just had his carotids evaluated and stable.  Sees Dr Lucky Cowboy.  On aspirin and plavix.  Continue.  Saw Dr Melrose Nakayama.  Recommended no further w/up and to continue current medication regimen.  Saw opthalmology.  States everything checked out fine.  Follow.

## 2014-02-28 NOTE — Assessment & Plan Note (Signed)
On thyroid replacement.  Follow tsh.  

## 2014-02-28 NOTE — Assessment & Plan Note (Signed)
Blood pressure doing well on current regimen.  He is now taking metoprolol one tablet in the am and 1/2 in the pm.  Follow.

## 2014-02-28 NOTE — Assessment & Plan Note (Signed)
GI w/up as outlined.  Follow cbc.  

## 2014-02-28 NOTE — Assessment & Plan Note (Signed)
Followed by cardiology.  Sees Dr Saralyn Pilar.  Currently doing well.  Follow.  Pacemaker in place.  Follow.

## 2014-02-28 NOTE — Assessment & Plan Note (Signed)
Persistent.  Unable to take antiinflammatories.  S/p injection.  Pain not as intense.  Follow.

## 2014-02-28 NOTE — Assessment & Plan Note (Signed)
Low cholesterol diet.  Remains on crestor.  Follow lipid profile and liver panel.

## 2014-02-28 NOTE — Assessment & Plan Note (Signed)
Has high grade stenosis.  S/p stent x 2.  Sees Dr Lucky Cowboy.  Continue risk factor modification.  Remain on aspirin and plavix.

## 2014-02-28 NOTE — Assessment & Plan Note (Signed)
Low carb diet and exercise.  Follow metabolic panel and a1c.   

## 2014-03-04 ENCOUNTER — Ambulatory Visit (INDEPENDENT_AMBULATORY_CARE_PROVIDER_SITE_OTHER): Payer: Medicare Other | Admitting: Podiatrist

## 2014-03-04 VITALS — BP 125/67 | HR 70 | Resp 16

## 2014-03-04 DIAGNOSIS — B351 Tinea unguium: Secondary | ICD-10-CM | POA: Diagnosis not present

## 2014-03-04 DIAGNOSIS — M79609 Pain in unspecified limb: Secondary | ICD-10-CM

## 2014-03-08 NOTE — Progress Notes (Signed)
HPI: Patient presents today for follow up of foot and nail care. Denies any new complaints today.  Objective: Patients chart is reviewed. Vascular status reveals pedal pulses noted at 2 out of 4 dp and pt bilateral . Neurological sensation is Normal to Semmes Weinstein monofilament bilateral. Patients nails are thickened, discolored, distrophic, friable and brittle with yellow-brown discoloration. Patient subjectively relates they are painful with shoes and with ambulation of bilateral feet.  Assessment: Symptomatic onychomycosis  Plan: Discussed treatment options and alternatives. The symptomatic toenails were debrided through manual an mechanical means without complication. Return appointment recommended at routine intervals of 3 months   

## 2014-05-12 DIAGNOSIS — Z9889 Other specified postprocedural states: Secondary | ICD-10-CM | POA: Insufficient documentation

## 2014-05-13 DIAGNOSIS — I2510 Atherosclerotic heart disease of native coronary artery without angina pectoris: Secondary | ICD-10-CM | POA: Diagnosis not present

## 2014-05-13 DIAGNOSIS — I10 Essential (primary) hypertension: Secondary | ICD-10-CM | POA: Diagnosis not present

## 2014-05-13 DIAGNOSIS — I5022 Chronic systolic (congestive) heart failure: Secondary | ICD-10-CM | POA: Diagnosis not present

## 2014-05-17 ENCOUNTER — Other Ambulatory Visit: Payer: Self-pay | Admitting: Internal Medicine

## 2014-05-17 NOTE — Telephone Encounter (Signed)
Rx faxed

## 2014-05-17 NOTE — Telephone Encounter (Signed)
Refilled lorazepam #30 with 2 refills.  rx signed and placed on your desk.

## 2014-05-17 NOTE — Telephone Encounter (Signed)
Last refill 7.26.15.  Last OV 6.4.15, next OV 9.4.15.  Please advise refill.

## 2014-05-25 ENCOUNTER — Other Ambulatory Visit (INDEPENDENT_AMBULATORY_CARE_PROVIDER_SITE_OTHER): Payer: Medicare Other

## 2014-05-25 ENCOUNTER — Encounter: Payer: Self-pay | Admitting: Internal Medicine

## 2014-05-25 DIAGNOSIS — E7800 Pure hypercholesterolemia, unspecified: Secondary | ICD-10-CM | POA: Diagnosis not present

## 2014-05-25 DIAGNOSIS — R7309 Other abnormal glucose: Secondary | ICD-10-CM

## 2014-05-25 DIAGNOSIS — E039 Hypothyroidism, unspecified: Secondary | ICD-10-CM

## 2014-05-25 DIAGNOSIS — R739 Hyperglycemia, unspecified: Secondary | ICD-10-CM

## 2014-05-25 DIAGNOSIS — I10 Essential (primary) hypertension: Secondary | ICD-10-CM

## 2014-05-25 LAB — HEPATIC FUNCTION PANEL
ALT: 11 U/L (ref 0–53)
AST: 15 U/L (ref 0–37)
Albumin: 3.9 g/dL (ref 3.5–5.2)
Alkaline Phosphatase: 69 U/L (ref 39–117)
BILIRUBIN TOTAL: 1 mg/dL (ref 0.2–1.2)
Bilirubin, Direct: 0.1 mg/dL (ref 0.0–0.3)
Total Protein: 6.8 g/dL (ref 6.0–8.3)

## 2014-05-25 LAB — BASIC METABOLIC PANEL
BUN: 12 mg/dL (ref 6–23)
CALCIUM: 9.1 mg/dL (ref 8.4–10.5)
CO2: 32 mEq/L (ref 19–32)
Chloride: 102 mEq/L (ref 96–112)
Creatinine, Ser: 1 mg/dL (ref 0.4–1.5)
GFR: 79.46 mL/min (ref >60.00)
GLUCOSE: 94 mg/dL (ref 70–99)
Potassium: 4.6 mEq/L (ref 3.5–5.1)
SODIUM: 138 meq/L (ref 135–145)

## 2014-05-25 LAB — LIPID PANEL
CHOL/HDL RATIO: 3
CHOLESTEROL: 141 mg/dL (ref 0–200)
HDL: 44.4 mg/dL (ref >39.00)
LDL CALC: 80 mg/dL (ref 0–99)
NonHDL: 96.6
Triglycerides: 81 mg/dL (ref 0.0–149.0)
VLDL: 16.2 mg/dL (ref 0.0–40.0)

## 2014-05-25 LAB — MICROALBUMIN / CREATININE URINE RATIO
CREATININE, U: 120.1 mg/dL
Microalb Creat Ratio: 1.3 mg/g (ref 0.0–30.0)
Microalb, Ur: 1.6 mg/dL (ref 0.0–1.9)

## 2014-05-25 LAB — TSH: TSH: 1.53 u[IU]/mL (ref 0.35–4.50)

## 2014-05-25 LAB — HEMOGLOBIN A1C: Hgb A1c MFr Bld: 6 % (ref 4.6–6.5)

## 2014-05-26 DIAGNOSIS — M25469 Effusion, unspecified knee: Secondary | ICD-10-CM | POA: Diagnosis not present

## 2014-05-26 DIAGNOSIS — M1710 Unilateral primary osteoarthritis, unspecified knee: Secondary | ICD-10-CM | POA: Diagnosis not present

## 2014-05-27 ENCOUNTER — Other Ambulatory Visit: Payer: Self-pay | Admitting: Internal Medicine

## 2014-05-28 ENCOUNTER — Encounter: Payer: Self-pay | Admitting: Internal Medicine

## 2014-05-28 ENCOUNTER — Ambulatory Visit (INDEPENDENT_AMBULATORY_CARE_PROVIDER_SITE_OTHER): Payer: Medicare Other | Admitting: Internal Medicine

## 2014-05-28 VITALS — BP 130/82 | HR 68 | Temp 98.3°F | Ht 69.0 in | Wt 207.8 lb

## 2014-05-28 DIAGNOSIS — E039 Hypothyroidism, unspecified: Secondary | ICD-10-CM

## 2014-05-28 DIAGNOSIS — R739 Hyperglycemia, unspecified: Secondary | ICD-10-CM

## 2014-05-28 DIAGNOSIS — M25561 Pain in right knee: Secondary | ICD-10-CM

## 2014-05-28 DIAGNOSIS — I10 Essential (primary) hypertension: Secondary | ICD-10-CM

## 2014-05-28 DIAGNOSIS — D649 Anemia, unspecified: Secondary | ICD-10-CM | POA: Diagnosis not present

## 2014-05-28 DIAGNOSIS — M25562 Pain in left knee: Secondary | ICD-10-CM

## 2014-05-28 DIAGNOSIS — H539 Unspecified visual disturbance: Secondary | ICD-10-CM | POA: Diagnosis not present

## 2014-05-28 DIAGNOSIS — Z23 Encounter for immunization: Secondary | ICD-10-CM

## 2014-05-28 DIAGNOSIS — I6529 Occlusion and stenosis of unspecified carotid artery: Secondary | ICD-10-CM

## 2014-05-28 DIAGNOSIS — R7309 Other abnormal glucose: Secondary | ICD-10-CM

## 2014-05-28 DIAGNOSIS — E7800 Pure hypercholesterolemia, unspecified: Secondary | ICD-10-CM

## 2014-05-28 DIAGNOSIS — I2510 Atherosclerotic heart disease of native coronary artery without angina pectoris: Secondary | ICD-10-CM

## 2014-05-28 DIAGNOSIS — Z125 Encounter for screening for malignant neoplasm of prostate: Secondary | ICD-10-CM

## 2014-05-28 DIAGNOSIS — M25569 Pain in unspecified knee: Secondary | ICD-10-CM

## 2014-05-28 LAB — PSA, MEDICARE: PSA: 0.28 ng/ml (ref 0.10–4.00)

## 2014-05-28 MED ORDER — ROSUVASTATIN CALCIUM 10 MG PO TABS: ORAL_TABLET | ORAL | Status: DC

## 2014-05-28 MED ORDER — PANTOPRAZOLE SODIUM 40 MG PO TBEC: 40.0000 mg | DELAYED_RELEASE_TABLET | Freq: Every day | ORAL | Status: DC

## 2014-05-28 MED ORDER — CLOPIDOGREL BISULFATE 75 MG PO TABS: ORAL_TABLET | ORAL | Status: DC

## 2014-05-28 MED ORDER — CITALOPRAM HYDROBROMIDE 20 MG PO TABS: 20.0000 mg | ORAL_TABLET | Freq: Every day | ORAL | Status: DC

## 2014-05-28 MED ORDER — LISINOPRIL 40 MG PO TABS: 40.0000 mg | ORAL_TABLET | Freq: Every day | ORAL | Status: DC

## 2014-05-28 MED ORDER — LEVOTHYROXINE SODIUM 112 MCG PO TABS: 112.0000 ug | ORAL_TABLET | Freq: Every day | ORAL | Status: DC

## 2014-05-28 NOTE — Progress Notes (Signed)
Pre visit review using our clinic review tool, if applicable. No additional management support is needed unless otherwise documented below in the visit note. 

## 2014-05-31 ENCOUNTER — Encounter: Payer: Self-pay | Admitting: Internal Medicine

## 2014-05-31 NOTE — Assessment & Plan Note (Signed)
Low cholesterol diet.  Remains on crestor.  Follow lipid profile and liver panel.

## 2014-05-31 NOTE — Assessment & Plan Note (Signed)
GI w/up as outlined.  Follow cbc.  

## 2014-05-31 NOTE — Progress Notes (Signed)
Subjective:    Patient ID: Daniel Austin, male    DOB: [birth year], 73 y.o.   MRN: [id number]  HPI 73 year old male with past history of hypothyroidism, hypertension, hypercholesterolemia, hyperglycemia and multivessel CAD (followed by Dr Saralyn Pilar) who is s/p pacemaker placement for second degree heart block.  He also is s/p stent x 2 for symptomatic high grade carotid stenosis.  He comes in today for a scheduled follow up.   He states that he is doing relatively well.  Has a pacemaker.  Sees Dr Saralyn Pilar.  Everything stable.  Breathing stable.  No chest pain or tightness.  Recently, he reported an episode of "blurry vision".  No actual vision loss.  No headache or dizziness.  Only lasted a few seconds.  Dr Lucky Cowboy is following his carotids.  Just checked a few months ago.  Stable.  On aspirin and plavix.  He saw Dr Melrose Nakayama.  Made no change in his medications.  Felt no further work up warranted.  He reports no chest pain or tightness.  No sob.  His blood pressure has been doing well.   Blood pressures attached.  Eating and drinking well.  No nausea or vomiting.  No bowel change.  Had recent aspiration and injection in his knee.  Pain not as intense.  Planning for synvisc injections.  Knee is doing better.  Continues to f/u with ortho.  Planning to see podiatry next week.     Past Medical History  Diagnosis Date  . Hypercholesterolemia   . Hypothyroidism   . Hypertension   . Fractures, multiple     Metal rod in left foot  . CAD (coronary artery disease)   . Carotid artery stenosis     high-grade symptomatic right sided s/p stent twice  . Syncope     second degree heart block s/p St Jude's pacemaker placement 12/08  . Anemia   . Ulcer of gastric fundus   . Hyperglycemia     Current Outpatient Prescriptions on File Prior to Visit  Medication Sig Dispense Refill  . Aspirin 81 MG EC tablet Take 81 mg by mouth daily.      . fexofenadine (ALLEGRA) 180 MG tablet Take 180 mg by mouth daily.      Marland Kitchen  LORazepam (ATIVAN) 1 MG tablet TAKE 1 TABLET BY MOUTH AT BEDTIME AS NEEDED ANXIETY  30 tablet  2  . metoprolol tartrate (LOPRESSOR) 25 MG tablet Take 1 tablet (25 mg total) by mouth 2 (two) times daily.  180 tablet  3  . naproxen sodium (ANAPROX) 220 MG tablet Take 220 mg by mouth as needed (knee pain).      . nitroGLYCERIN (NITROSTAT) 0.4 MG SL tablet Place 0.4 mg under the tongue every 5 (five) minutes as needed. If a third tablet is needed please call 911.       No current facility-administered medications on file prior to visit.    Review of Systems Patient denies any headache, lightheadedness or dizziness.  Previous vision change as outlined.  No vision loss.  No further vision change since last visit.  No syncope or near syncopal episodes.  No chest pain, tightness or palpitations.   No increased shortness of breath, cough or congestion.  No nausea or vomiting.  No acid reflux. No abdominal pain or cramping.  No bowel change, such as diarrhea, constipation, BRBPR or melana.  No urine change.  Stays active.  Knee pain not as intense.  Blood pressure as outlined.  Objective:   Physical Exam   Filed Vitals:   05/28/14 0831  BP: 130/82  Pulse: 68  Temp: 98.3 F (36.8 C)   Blood pressure recheck:  118/82, pulse 51  73 year old male in no acute distress.  HEENT:  Nares - clear.  Oropharynx - without lesions. NECK:  Supple.  Nontender.  No audible carotid bruit.  HEART:  Appears to be regular.   LUNGS:  No crackles or wheezing audible.  Respirations even and unlabored.   RADIAL PULSE:  Equal bilaterally.  ABDOMEN:  Soft.  Nontender.  Bowel sounds present and normal.  No audible abdominal bruit.   EXTREMITIES:  No increased edema present.  DP pulses palpable and equal bilaterally.   SKIN:  No lesions.         Assessment & Plan:  HEALTH MAINTENANCE.  Physical 01/04/14.  PSA 03/11/13 - .38.  Due psa.  Will check today.  Colonoscopy 12/20/10 - diverticulosis.  Recommended repeat  colonoscopy - five years.

## 2014-05-31 NOTE — Assessment & Plan Note (Signed)
On thyroid replacement.  Follow tsh.  

## 2014-05-31 NOTE — Assessment & Plan Note (Signed)
Low carb diet and exercise.  Follow metabolic panel and a1c.   

## 2014-05-31 NOTE — Assessment & Plan Note (Signed)
Brief episode previously as outlined.  Just had his carotids evaluated and stable.  Sees Dr Lucky Cowboy.  On aspirin and plavix.  Continue.  Saw Dr Melrose Nakayama.  Recommended no further w/up and to continue current medication regimen.  Saw opthalmology.  States everything checked out fine.  Follow.

## 2014-05-31 NOTE — Assessment & Plan Note (Signed)
Followed by cardiology.  Sees Dr Saralyn Pilar.  Currently doing well.  Follow.  Pacemaker in place.  Follow.

## 2014-05-31 NOTE — Assessment & Plan Note (Signed)
Blood pressure doing well on current regimen.  No dizziness or light headedness.  Follow.

## 2014-05-31 NOTE — Assessment & Plan Note (Signed)
Has high grade stenosis.  S/p stent x 2.  Sees Dr Lucky Cowboy.  Continue risk factor modification.  Remain on aspirin and plavix.

## 2014-05-31 NOTE — Assessment & Plan Note (Addendum)
Persistent.  Unable to take antiinflammatories.  S/p aspiration and injection.  Pain not as intense.  Follow.  Planning for synvisc injections.  Continue to f/u with ortho.

## 2014-06-03 ENCOUNTER — Ambulatory Visit (INDEPENDENT_AMBULATORY_CARE_PROVIDER_SITE_OTHER): Payer: Medicare Other | Admitting: Podiatry

## 2014-06-03 DIAGNOSIS — M79609 Pain in unspecified limb: Secondary | ICD-10-CM

## 2014-06-03 DIAGNOSIS — B351 Tinea unguium: Secondary | ICD-10-CM | POA: Diagnosis not present

## 2014-06-03 DIAGNOSIS — M79676 Pain in unspecified toe(s): Secondary | ICD-10-CM

## 2014-06-04 ENCOUNTER — Other Ambulatory Visit: Payer: Self-pay | Admitting: Internal Medicine

## 2014-06-05 NOTE — Progress Notes (Signed)
Patient ID: Daniel Austin, male   DOB: 08-Aug-1941, 73 y.o.   MRN: [id number]  Subjective: Daniel Austin tears the office today for painful elongated nails. He said his nails are painful with shoe gear. Denies any changes since last appointment. No other complaints today.  Objective: AAO x3, NAD DP/PT pulses palpable 2/4 b/l. CRT < 3sec Protective sensation intact with Daniel Austin monofilament, vibratory sensation intact, Achilles tendon reflex intact. Nails dystrophic, brittle, hypertrophic, elongated, yellow discoloration. No surrounding erythema or drainage. No open lesions or pre-ulcerative lesions. MMT 5/5, ROM WNL No leg pain, swelling, warmth   Assessment: 73 year old male with symptomatic onychomycosis.  Plan: -Treatment options discussed including alternatives, risks, complications. -Nails sharply debrided O67 without complications. -Daily foot inspection discussed with the patient. -Followup in 3 months or sooner if any problems are to arise or any changes symptoms.

## 2014-07-07 DIAGNOSIS — M25461 Effusion, right knee: Secondary | ICD-10-CM | POA: Diagnosis not present

## 2014-07-07 DIAGNOSIS — M25462 Effusion, left knee: Secondary | ICD-10-CM | POA: Diagnosis not present

## 2014-07-07 DIAGNOSIS — M170 Bilateral primary osteoarthritis of knee: Secondary | ICD-10-CM | POA: Diagnosis not present

## 2014-08-14 ENCOUNTER — Other Ambulatory Visit: Payer: Self-pay | Admitting: Internal Medicine

## 2014-08-16 NOTE — Telephone Encounter (Signed)
Last Ov 9.4.15, last refill 10.24.2015.  Please advise refill

## 2014-08-16 NOTE — Telephone Encounter (Signed)
Refilled lorazepam #30 with one refill.

## 2014-08-16 NOTE — Telephone Encounter (Signed)
Rx faxed

## 2014-08-17 ENCOUNTER — Other Ambulatory Visit: Payer: Self-pay | Admitting: Internal Medicine

## 2014-09-28 ENCOUNTER — Other Ambulatory Visit (INDEPENDENT_AMBULATORY_CARE_PROVIDER_SITE_OTHER): Payer: Medicare Other

## 2014-09-28 DIAGNOSIS — I6529 Occlusion and stenosis of unspecified carotid artery: Secondary | ICD-10-CM | POA: Diagnosis not present

## 2014-09-28 DIAGNOSIS — E7800 Pure hypercholesterolemia, unspecified: Secondary | ICD-10-CM

## 2014-09-28 DIAGNOSIS — I10 Essential (primary) hypertension: Secondary | ICD-10-CM

## 2014-09-28 DIAGNOSIS — R739 Hyperglycemia, unspecified: Secondary | ICD-10-CM

## 2014-09-28 DIAGNOSIS — D649 Anemia, unspecified: Secondary | ICD-10-CM

## 2014-09-28 LAB — CBC WITH DIFFERENTIAL/PLATELET
BASOS PCT: 1.1 % (ref 0.0–3.0)
Basophils Absolute: 0.1 10*3/uL (ref 0.0–0.1)
Eosinophils Absolute: 0.3 10*3/uL (ref 0.0–0.7)
Eosinophils Relative: 6.7 % — ABNORMAL HIGH (ref 0.0–5.0)
HCT: 41.1 % (ref 39.0–52.0)
Hemoglobin: 13.5 g/dL (ref 13.0–17.0)
LYMPHS PCT: 28.8 % (ref 12.0–46.0)
Lymphs Abs: 1.3 10*3/uL (ref 0.7–4.0)
MCHC: 32.8 g/dL (ref 30.0–36.0)
MCV: 88.1 fl (ref 78.0–100.0)
MONOS PCT: 8.7 % (ref 3.0–12.0)
Monocytes Absolute: 0.4 10*3/uL (ref 0.1–1.0)
Neutro Abs: 2.5 10*3/uL (ref 1.4–7.7)
Neutrophils Relative %: 54.7 % (ref 43.0–77.0)
PLATELETS: 262 10*3/uL (ref 150.0–400.0)
RBC: 4.67 Mil/uL (ref 4.22–5.81)
RDW: 13.6 % (ref 11.5–15.5)
WBC: 4.6 10*3/uL (ref 4.0–10.5)

## 2014-09-28 LAB — LIPID PANEL
CHOLESTEROL: 159 mg/dL (ref 0–200)
HDL: 44.9 mg/dL (ref >39.00)
LDL Cholesterol: 87 mg/dL (ref 0–99)
NONHDL: 114.1
TRIGLYCERIDES: 134 mg/dL (ref 0.0–149.0)
Total CHOL/HDL Ratio: 4
VLDL: 26.8 mg/dL (ref 0.0–40.0)

## 2014-09-28 LAB — BASIC METABOLIC PANEL
BUN: 13 mg/dL (ref 6–23)
CALCIUM: 9.3 mg/dL (ref 8.4–10.5)
CO2: 29 meq/L (ref 19–32)
Chloride: 102 mEq/L (ref 96–112)
Creatinine, Ser: 0.9 mg/dL (ref 0.4–1.5)
GFR: 88.72 mL/min (ref >60.00)
GLUCOSE: 98 mg/dL (ref 70–99)
Potassium: 4.7 mEq/L (ref 3.5–5.1)
Sodium: 136 mEq/L (ref 135–145)

## 2014-09-28 LAB — HEMOGLOBIN A1C: Hgb A1c MFr Bld: 6 % (ref 4.6–6.5)

## 2014-09-28 LAB — HEPATIC FUNCTION PANEL
ALK PHOS: 85 U/L (ref 39–117)
ALT: 9 U/L (ref 0–53)
AST: 14 U/L (ref 0–37)
Albumin: 4.4 g/dL (ref 3.5–5.2)
BILIRUBIN DIRECT: 0 mg/dL (ref 0.0–0.3)
Total Bilirubin: 0.9 mg/dL (ref 0.2–1.2)
Total Protein: 7.4 g/dL (ref 6.0–8.3)

## 2014-09-29 ENCOUNTER — Encounter: Payer: Self-pay | Admitting: Internal Medicine

## 2014-09-29 ENCOUNTER — Ambulatory Visit (INDEPENDENT_AMBULATORY_CARE_PROVIDER_SITE_OTHER): Payer: Medicare Other | Admitting: Internal Medicine

## 2014-09-29 VITALS — BP 120/80 | Temp 97.8°F | Ht 69.0 in | Wt 202.5 lb

## 2014-09-29 DIAGNOSIS — R208 Other disturbances of skin sensation: Secondary | ICD-10-CM

## 2014-09-29 DIAGNOSIS — I6529 Occlusion and stenosis of unspecified carotid artery: Secondary | ICD-10-CM

## 2014-09-29 DIAGNOSIS — R0789 Other chest pain: Secondary | ICD-10-CM | POA: Diagnosis not present

## 2014-09-29 DIAGNOSIS — E7800 Pure hypercholesterolemia, unspecified: Secondary | ICD-10-CM

## 2014-09-29 DIAGNOSIS — R0602 Shortness of breath: Secondary | ICD-10-CM

## 2014-09-29 DIAGNOSIS — D649 Anemia, unspecified: Secondary | ICD-10-CM

## 2014-09-29 DIAGNOSIS — M25562 Pain in left knee: Secondary | ICD-10-CM | POA: Diagnosis not present

## 2014-09-29 DIAGNOSIS — R739 Hyperglycemia, unspecified: Secondary | ICD-10-CM

## 2014-09-29 DIAGNOSIS — M25561 Pain in right knee: Secondary | ICD-10-CM | POA: Diagnosis not present

## 2014-09-29 DIAGNOSIS — I2510 Atherosclerotic heart disease of native coronary artery without angina pectoris: Secondary | ICD-10-CM | POA: Diagnosis not present

## 2014-09-29 DIAGNOSIS — I10 Essential (primary) hypertension: Secondary | ICD-10-CM | POA: Diagnosis not present

## 2014-09-29 DIAGNOSIS — M170 Bilateral primary osteoarthritis of knee: Secondary | ICD-10-CM | POA: Insufficient documentation

## 2014-09-29 DIAGNOSIS — E039 Hypothyroidism, unspecified: Secondary | ICD-10-CM | POA: Diagnosis not present

## 2014-09-29 DIAGNOSIS — R200 Anesthesia of skin: Secondary | ICD-10-CM

## 2014-09-29 NOTE — Patient Instructions (Addendum)
Appointment with Dr Melrose Nakayama [facility] Neurology) - 09/30/14 7:30  Appointment with Dr Saralyn Pilar [facility] - cardiology) - 09/30/14 - 10:00

## 2014-09-29 NOTE — Progress Notes (Signed)
Subjective:    Patient ID: Daniel Austin, male    DOB: [birth year], 74 y.o.   MRN: [id number]  HPI 74 year old male with past history of hypothyroidism, hypertension, hypercholesterolemia, hyperglycemia and multivessel CAD (followed by Dr Daniel Austin) who is s/p pacemaker placement for second degree heart block.  He also is s/p stent x 2 for symptomatic high grade carotid stenosis.  He comes in today for a scheduled follow up.   Has a pacemaker.  Sees Dr Daniel Austin.  has noticed recently being more sob with exertion, especially stairs.  Some questionable chest tightness associated.  No increased cough or congestion.  No increased acid reflux.  He also noticed yesterday morning that his finger had turned colors.  No injury or trauma.  After having blood drawn yesterday, noticed some intermittent left arm numbness.  Had an appt with Dr Daniel Austin yesterday to have his carotids reevaluated.   Dr Daniel Austin is following his carotids.  Per pt - stable.  They also checked the blood flow in his arm.  Reports had blood flow present.   On aspirin and plavix.  Dr Daniel Austin recommended he continue the aspirin and plavix and to follow.  If no better by next week, may need to do arteriogram.  He reports no chest pain or tightness right now.  Again mostly notices with exertion.  Eating and drinking well.  No nausea or vomiting.  No bowel change.  Having problems with both knees.  Planning to see ortho today to discuss surgery.  Is s/p aspiration and cortisone injection in his knee.  Also s/p synvisc injections.  Has not helped.  States has bone on bone.      Past Medical History  Diagnosis Date  . Hypercholesterolemia   . Hypothyroidism   . Hypertension   . Fractures, multiple     Metal rod in left foot  . CAD (coronary artery disease)   . Carotid artery stenosis     high-grade symptomatic right sided s/p stent twice  . Syncope     second degree heart block s/p St Jude's pacemaker placement 12/08  . Anemia   . Ulcer of gastric  fundus   . Hyperglycemia     Current Outpatient Prescriptions on File Prior to Visit  Medication Sig Dispense Refill  . Aspirin 81 MG EC tablet Take 81 mg by mouth daily.    . citalopram (CELEXA) 20 MG tablet Take 1 tablet (20 mg total) by mouth daily. 90 tablet 1  . clopidogrel (PLAVIX) 75 MG tablet TAKE 1 TABLET (75 MG TOTAL) BY MOUTH DAILY. 30 tablet 5  . fexofenadine (ALLEGRA) 180 MG tablet Take 180 mg by mouth daily.    Marland Kitchen levothyroxine (SYNTHROID, LEVOTHROID) 112 MCG tablet Take 1 tablet (112 mcg total) by mouth daily. 30 tablet 5  . lisinopril (PRINIVIL,ZESTRIL) 40 MG tablet Take 1 tablet (40 mg total) by mouth daily. 30 tablet 5  . LORazepam (ATIVAN) 1 MG tablet TAKE 1 TABLET BY MOUTH AT BEDTIME AS NEEDED FOR ANXIETY 30 tablet 2  . metoprolol tartrate (LOPRESSOR) 25 MG tablet Take 1 tablet (25 mg total) by mouth 2 (two) times daily. 180 tablet 3  . naproxen sodium (ANAPROX) 220 MG tablet Take 220 mg by mouth as needed (knee pain).    . nitroGLYCERIN (NITROSTAT) 0.4 MG SL tablet Place 0.4 mg under the tongue every 5 (five) minutes as needed. If a third tablet is needed please call 911.    . pantoprazole (PROTONIX) 40 MG  tablet Take 1 tablet (40 mg total) by mouth daily. 30 tablet 5  . rosuvastatin (CRESTOR) 10 MG tablet TAKE 1 TABLET (10 MG TOTAL) BY MOUTH DAILY. 30 tablet 5   No current facility-administered medications on file prior to visit.    Review of Systems Patient denies any headache, lightheadedness or dizziness.  No further vision change or vision loss.  No syncope or near syncopal episodes.  No palpitations.   Does report the increased sob with exertion and some associated (possible) tightness.  No increased cough or congestion.  No nausea or vomiting.  No acid reflux. No abdominal pain or cramping.  No bowel change, such as diarrhea, constipation, BRBPR or melana.  No urine change.  Stays active.  Left arm numbness as outlined.  Intermittent.  States noticed more when on the  table having his carotids and blood flow checked.  Finger change as outlined.  Increased bilateral knee pain as outlined.         Objective:   Physical Exam  Filed Vitals:   09/29/14 0758  BP: 120/80  Temp: 97.8 F (36.6 C)   Blood pressure recheck:  138/78, pulse 60, pulse ox 34-5%  74 year old male in no acute distress.  HEENT:  Nares - clear.  Oropharynx - without lesions. NECK:  Supple.  Nontender.   HEART:  Appears to be regular.   LUNGS:  No crackles or wheezing audible.  Respirations even and unlabored.   RADIAL PULSE:  Palpable bilaterally.   ABDOMEN:  Soft.  Nontender.  Bowel sounds present and normal.  No audible abdominal bruit.   EXTREMITIES:  No increased edema present.  Third finger left hand with color change.  Appears to be slightly more cool.  Able to move and flex finger.  No pain with flexion or extension.  No pain to palpation.        Assessment & Plan:  1. SOB (shortness of breath) Has know CAD.  Reports sob with exertion.  Some chest tightness.   - EKG 12-Lead revealed paced rhythm.  Sees Dr Daniel Austin.  Feels need further cardiac evaluation.  Pt agreeable.  appt scheduled with Dr Daniel Austin.    2. Other chest pain See above.   EKG 12-Lead - as outlined.  Appointment made to see cardiology.  Discussed transferring to ER for further evaluation given left arm numbness, concern regarding possible embolic event to finger and his sob/chest tightness.  He declines.  Will keep his scheduled appts tomorrow.  States if any change or worsening problems, he will be evaluated immediately.    3. Atherosclerosis of native coronary artery of native heart without angina pectoris See above.  Refer to cardiology.    4. Essential hypertension, benign Blood pressure here appears to be doing well.  Same medications.  Follow.  Recent Cr .9.    5. Carotid artery stenosis, unspecified laterality Is followed by Dr Daniel Austin. Just evaluated yesterday.  Pt reports stable.  See above.   Continues on aspirin and plavix.  Concern over possible embolic event - to left third finger.  States blood flow in arm checked yesterday (with ultrasound) and ok.  Continue f/u with Dr Daniel Austin.  Continue aspirin and plavix.    6. Hypothyroidism, unspecified hypothyroidism type On thyroid replacement.  Follow tsh.    7. Pure hypercholesterolemia Low cholesterol diet and exercise.  Continue crestor.  Follow lipid panel and liver function.   Lab Results  Component Value Date   CHOL 159 09/28/2014  HDL 44.90 09/28/2014   LDLCALC 87 09/28/2014   TRIG 134.0 09/28/2014   CHOLHDL 4 09/28/2014   8. Anemia, unspecified anemia type hgb 13.5 09/27/14.  Follow.    9. Hyperglycemia Low carb diet.  A1c just checked 6.0.  Follow.    10. Knee pain, bilateral Seeing ortho.  S/p aspiration and cortisone injection.  S/p synvisc injections.  Persistent pain.  States has bone on bone.  Seeing ortho today.  Explained would not be able to have surgery until above issues diagnosed and resolved.    11. Left arm numbness Unclear etiology.  Had the change in the finger as outlined.  Has known carotid disease.  Unclear if could be a vascular issue, TIA or nerve etiology.  Just evaluated by vascular surgery yesterday.   Ultrasound revealed blood flow ok.  Able to move the arm and has no weakness described.  On aspirin and plavix.  Continue.  Would like neurology to evaluate.  Discussed transfer over to ER as outlined above.  He declines.  appt mad with Dr Melrose Nakayama.  Did agree if had any change or worsening symptoms he would be evaluated immediately.    HEALTH MAINTENANCE.  Physical 01/04/14.  PSA 05/28/14 - .28.  Colonoscopy 12/20/10 - diverticulosis.  Recommended repeat colonoscopy - five years.    I spent over 40 minutes with the patient and more than 50% of the time was spent in consultation regarding the abov.e

## 2014-09-29 NOTE — Progress Notes (Signed)
Pre visit review using our clinic review tool, if applicable. No additional management support is needed unless otherwise documented below in the visit note. 

## 2014-09-30 ENCOUNTER — Encounter: Payer: Self-pay | Admitting: Internal Medicine

## 2014-09-30 DIAGNOSIS — H539 Unspecified visual disturbance: Secondary | ICD-10-CM | POA: Diagnosis not present

## 2014-09-30 DIAGNOSIS — R209 Unspecified disturbances of skin sensation: Secondary | ICD-10-CM | POA: Insufficient documentation

## 2014-09-30 DIAGNOSIS — I255 Ischemic cardiomyopathy: Secondary | ICD-10-CM | POA: Diagnosis not present

## 2014-09-30 DIAGNOSIS — I998 Other disorder of circulatory system: Secondary | ICD-10-CM | POA: Diagnosis not present

## 2014-09-30 DIAGNOSIS — I2510 Atherosclerotic heart disease of native coronary artery without angina pectoris: Secondary | ICD-10-CM | POA: Diagnosis not present

## 2014-09-30 DIAGNOSIS — R200 Anesthesia of skin: Secondary | ICD-10-CM | POA: Insufficient documentation

## 2014-09-30 DIAGNOSIS — R202 Paresthesia of skin: Secondary | ICD-10-CM | POA: Insufficient documentation

## 2014-09-30 DIAGNOSIS — I10 Essential (primary) hypertension: Secondary | ICD-10-CM | POA: Diagnosis not present

## 2014-09-30 DIAGNOSIS — I422 Other hypertrophic cardiomyopathy: Secondary | ICD-10-CM | POA: Diagnosis not present

## 2014-09-30 DIAGNOSIS — I5022 Chronic systolic (congestive) heart failure: Secondary | ICD-10-CM | POA: Diagnosis not present

## 2014-10-05 ENCOUNTER — Ambulatory Visit: Payer: Medicare Other | Admitting: Podiatry

## 2014-10-11 DIAGNOSIS — R0602 Shortness of breath: Secondary | ICD-10-CM | POA: Diagnosis not present

## 2014-10-11 DIAGNOSIS — R079 Chest pain, unspecified: Secondary | ICD-10-CM | POA: Diagnosis not present

## 2014-10-11 DIAGNOSIS — I255 Ischemic cardiomyopathy: Secondary | ICD-10-CM | POA: Diagnosis not present

## 2014-10-11 DIAGNOSIS — I5022 Chronic systolic (congestive) heart failure: Secondary | ICD-10-CM | POA: Diagnosis not present

## 2014-10-20 ENCOUNTER — Telehealth: Payer: Self-pay | Admitting: Internal Medicine

## 2014-10-20 NOTE — Telephone Encounter (Signed)
Pt wife call to check on appt date and time. She was advised that the next appt was 11/23/14. Pt stated that the appt was in February. The scheduler put the pt down for 4 month and not 4wks. Please advise where to add pt the schedule.msn

## 2014-10-21 NOTE — Telephone Encounter (Signed)
I can see him at 1:00 on 11/03/14.  Thanks.

## 2014-10-25 DIAGNOSIS — I255 Ischemic cardiomyopathy: Secondary | ICD-10-CM | POA: Diagnosis not present

## 2014-10-25 DIAGNOSIS — I340 Nonrheumatic mitral (valve) insufficiency: Secondary | ICD-10-CM | POA: Diagnosis not present

## 2014-10-25 DIAGNOSIS — I10 Essential (primary) hypertension: Secondary | ICD-10-CM | POA: Diagnosis not present

## 2014-10-25 DIAGNOSIS — Z01810 Encounter for preprocedural cardiovascular examination: Secondary | ICD-10-CM | POA: Diagnosis not present

## 2014-10-25 DIAGNOSIS — I2510 Atherosclerotic heart disease of native coronary artery without angina pectoris: Secondary | ICD-10-CM | POA: Diagnosis not present

## 2014-11-03 ENCOUNTER — Ambulatory Visit (INDEPENDENT_AMBULATORY_CARE_PROVIDER_SITE_OTHER): Payer: Medicare Other | Admitting: Internal Medicine

## 2014-11-03 ENCOUNTER — Encounter: Payer: Self-pay | Admitting: Internal Medicine

## 2014-11-03 VITALS — BP 130/80 | HR 60 | Temp 97.6°F | Ht 69.0 in | Wt 201.5 lb

## 2014-11-03 DIAGNOSIS — R208 Other disturbances of skin sensation: Secondary | ICD-10-CM

## 2014-11-03 DIAGNOSIS — I10 Essential (primary) hypertension: Secondary | ICD-10-CM

## 2014-11-03 DIAGNOSIS — I2510 Atherosclerotic heart disease of native coronary artery without angina pectoris: Secondary | ICD-10-CM

## 2014-11-03 DIAGNOSIS — M25561 Pain in right knee: Secondary | ICD-10-CM | POA: Diagnosis not present

## 2014-11-03 DIAGNOSIS — I6529 Occlusion and stenosis of unspecified carotid artery: Secondary | ICD-10-CM | POA: Diagnosis not present

## 2014-11-03 DIAGNOSIS — E7800 Pure hypercholesterolemia, unspecified: Secondary | ICD-10-CM

## 2014-11-03 DIAGNOSIS — R739 Hyperglycemia, unspecified: Secondary | ICD-10-CM

## 2014-11-03 DIAGNOSIS — R200 Anesthesia of skin: Secondary | ICD-10-CM

## 2014-11-03 DIAGNOSIS — M25562 Pain in left knee: Secondary | ICD-10-CM

## 2014-11-03 DIAGNOSIS — E039 Hypothyroidism, unspecified: Secondary | ICD-10-CM

## 2014-11-03 MED ORDER — METOPROLOL TARTRATE 25 MG PO TABS: 25.0000 mg | ORAL_TABLET | Freq: Two times a day (BID) | ORAL | Status: DC

## 2014-11-03 MED ORDER — LORAZEPAM 1 MG PO TABS: ORAL_TABLET | ORAL | Status: DC

## 2014-11-03 NOTE — Progress Notes (Signed)
Pre visit review using our clinic review tool, if applicable. No additional management support is needed unless otherwise documented below in the visit note. 

## 2014-11-03 NOTE — Progress Notes (Signed)
Patient ID: Daniel Austin, male   DOB: 1941-03-15, 74 y.o.   MRN: [id number]   Subjective:    Patient ID: Daniel Austin, male    DOB: 12-Mar-1941, 74 y.o.   MRN: [id number]  HPI  Patient here for a scheduled follow up.  Recently had discoloration of his left third digit.  See last note for details.  Saw vascular surgery for evaluation.  See their note for details.  His finger is better.  The color has returned to normal.  Still having numbness in his left arm.  I had referred him to neurology for further evaluation.  See Dr Lannie Fields note for details.   No further testing for the numbness.  We discussed this today.  Pt reports he is planning to have his heart catheterization tomorrow.  Does not want to pursue any further testing until his heart issues are sorted through.  Still with sob with exertion.  Eating and drinking well.  Knees are worse.  States needs something done about his knees, but will have to get the heart issues straightened out first.  Handling stress well.     Past Medical History  Diagnosis Date  . Hypercholesterolemia   . Hypothyroidism   . Hypertension   . Fractures, multiple     Metal rod in left foot  . CAD (coronary artery disease)   . Carotid artery stenosis     high-grade symptomatic right sided s/p stent twice  . Syncope     second degree heart block s/p St Jude's pacemaker placement 12/08  . Anemia   . Ulcer of gastric fundus   . Hyperglycemia     Current Outpatient Prescriptions on File Prior to Visit  Medication Sig Dispense Refill  . Aspirin 81 MG EC tablet Take 81 mg by mouth daily.    . citalopram (CELEXA) 20 MG tablet Take 1 tablet (20 mg total) by mouth daily. 90 tablet 1  . clopidogrel (PLAVIX) 75 MG tablet TAKE 1 TABLET (75 MG TOTAL) BY MOUTH DAILY. 30 tablet 5  . fexofenadine (ALLEGRA) 180 MG tablet Take 180 mg by mouth daily.    Marland Kitchen levothyroxine (SYNTHROID, LEVOTHROID) 112 MCG tablet Take 1 tablet (112 mcg total) by mouth daily. 30 tablet 5  .  lisinopril (PRINIVIL,ZESTRIL) 40 MG tablet Take 1 tablet (40 mg total) by mouth daily. 30 tablet 5  . naproxen sodium (ANAPROX) 220 MG tablet Take 220 mg by mouth as needed (knee pain).    . nitroGLYCERIN (NITROSTAT) 0.4 MG SL tablet Place 0.4 mg under the tongue every 5 (five) minutes as needed. If a third tablet is needed please call 911.    . pantoprazole (PROTONIX) 40 MG tablet Take 1 tablet (40 mg total) by mouth daily. 30 tablet 5  . rosuvastatin (CRESTOR) 10 MG tablet TAKE 1 TABLET (10 MG TOTAL) BY MOUTH DAILY. 30 tablet 5   No current facility-administered medications on file prior to visit.    Review of Systems  Constitutional: Negative for appetite change and unexpected weight change.  HENT: Negative for congestion and sinus pressure.   Respiratory: Positive for shortness of breath (reports sob with exertion.  ). Negative for cough and chest tightness.   Cardiovascular: Negative for palpitations and leg swelling.  Gastrointestinal: Negative for nausea, vomiting, abdominal pain and diarrhea.  Musculoskeletal: Negative for back pain and joint swelling.       Persistent worsening bilateral knee pain.    Neurological: Negative for dizziness, light-headedness and headaches.  Left arm numbness as outlined.          Objective:    Physical Exam  Constitutional: He appears well-developed and well-nourished. No distress.  HENT:  Nose: Nose normal.  Mouth/Throat: Oropharynx is clear and moist.  Neck: Neck supple. No thyromegaly present.  Cardiovascular: Normal rate and regular rhythm.   Pulmonary/Chest: Effort normal and breath sounds normal. No respiratory distress.  Abdominal: Soft. Bowel sounds are normal. There is no tenderness.  Musculoskeletal: He exhibits no edema.  Lymphadenopathy:    He has no cervical adenopathy.  Skin: No rash noted. No erythema.  Left third finger - no discoloration.    Psychiatric: He has a normal mood and affect. His behavior is normal.     BP 130/80 mmHg  Pulse 60  Temp(Src) 97.6 F (36.4 C) (Oral)  Ht '5\' 9"'  (1.753 m)  Wt 201 lb 8 oz (91.4 kg)  BMI 29.74 kg/m2  SpO2 97% Wt Readings from Last 3 Encounters:  11/03/14 201 lb 8 oz (91.4 kg)  09/29/14 202 lb 8 oz (91.853 kg)  05/28/14 207 lb 12 oz (94.235 kg)     Lab Results  Component Value Date   WBC 4.6 09/28/2014   HGB 13.5 09/28/2014   HCT 41.1 09/28/2014   PLT 262.0 09/28/2014   GLUCOSE 98 09/28/2014   CHOL 159 09/28/2014   TRIG 134.0 09/28/2014   HDL 44.90 09/28/2014   LDLCALC 87 09/28/2014   ALT 9 09/28/2014   AST 14 09/28/2014   NA 136 09/28/2014   K 4.7 09/28/2014   CL 102 09/28/2014   CREATININE 0.9 09/28/2014   BUN 13 09/28/2014   CO2 29 09/28/2014   TSH 1.53 05/25/2014   PSA 0.28 05/28/2014   HGBA1C 6.0 09/28/2014   MICROALBUR 1.6 05/25/2014       Assessment & Plan:   Problem List Items Addressed This Visit    Carotid artery stenosis    Had high grade stenosis.  S/p stent x 2.  Continue risk factor modification.  Continue aspirin and plavix.  Continue to f/u with vascular surgery.        Relevant Medications   metoprolol tartrate (LOPRESSOR) tablet   Coronary atherosclerosis of native coronary artery - Primary    Seeing Dr Saralyn Pilar.  Has the sob with exertion.  Planning for heart catheterization tomorrow.  Continue aggressive risk factor modification.        Relevant Medications   metoprolol tartrate (LOPRESSOR) tablet   Essential hypertension, benign    Blood pressure as outlined.  Follow pressures.  Same medication regimen.  Follow.        Relevant Medications   metoprolol tartrate (LOPRESSOR) tablet   Hyperglycemia    Low carb diet and exercise.  Follow met b and a1c.        Hypothyroidism    On thyroid replacement.  Follow tsh.        Relevant Medications   metoprolol tartrate (LOPRESSOR) tablet   Knee pain, bilateral    Persistent worsening pain.  Needs surgery (per pt).  Will need to get his heart issues  sorted through first.  Follow.        Left arm numbness    Persistent.  Saw neurology.  See Dr Lannie Fields note for details.  Desires no further w/up at this point.  Follow.        Left knee pain   Pure hypercholesterolemia    Low cholesterol diet.  Remains on crestor.  Follow lipid panel  and liver function tests.        Relevant Medications   metoprolol tartrate (LOPRESSOR) tablet       Einar Pheasant, MD

## 2014-11-04 ENCOUNTER — Ambulatory Visit: Payer: Self-pay | Admitting: Cardiology

## 2014-11-04 DIAGNOSIS — Z809 Family history of malignant neoplasm, unspecified: Secondary | ICD-10-CM | POA: Diagnosis not present

## 2014-11-04 DIAGNOSIS — E785 Hyperlipidemia, unspecified: Secondary | ICD-10-CM | POA: Diagnosis not present

## 2014-11-04 DIAGNOSIS — Z7901 Long term (current) use of anticoagulants: Secondary | ICD-10-CM | POA: Diagnosis not present

## 2014-11-04 DIAGNOSIS — Z951 Presence of aortocoronary bypass graft: Secondary | ICD-10-CM | POA: Diagnosis not present

## 2014-11-04 DIAGNOSIS — Z823 Family history of stroke: Secondary | ICD-10-CM | POA: Diagnosis not present

## 2014-11-04 DIAGNOSIS — Z950 Presence of cardiac pacemaker: Secondary | ICD-10-CM | POA: Diagnosis not present

## 2014-11-04 DIAGNOSIS — Z01810 Encounter for preprocedural cardiovascular examination: Secondary | ICD-10-CM | POA: Diagnosis not present

## 2014-11-04 DIAGNOSIS — Z7982 Long term (current) use of aspirin: Secondary | ICD-10-CM | POA: Diagnosis not present

## 2014-11-04 DIAGNOSIS — Z95818 Presence of other cardiac implants and grafts: Secondary | ICD-10-CM | POA: Diagnosis not present

## 2014-11-04 DIAGNOSIS — I25119 Atherosclerotic heart disease of native coronary artery with unspecified angina pectoris: Secondary | ICD-10-CM | POA: Diagnosis not present

## 2014-11-04 DIAGNOSIS — E039 Hypothyroidism, unspecified: Secondary | ICD-10-CM | POA: Diagnosis not present

## 2014-11-04 DIAGNOSIS — Z7401 Bed confinement status: Secondary | ICD-10-CM | POA: Diagnosis not present

## 2014-11-04 DIAGNOSIS — R9430 Abnormal result of cardiovascular function study, unspecified: Secondary | ICD-10-CM | POA: Diagnosis not present

## 2014-11-04 DIAGNOSIS — I255 Ischemic cardiomyopathy: Secondary | ICD-10-CM | POA: Diagnosis not present

## 2014-11-04 DIAGNOSIS — Z8249 Family history of ischemic heart disease and other diseases of the circulatory system: Secondary | ICD-10-CM | POA: Diagnosis not present

## 2014-11-04 DIAGNOSIS — M170 Bilateral primary osteoarthritis of knee: Secondary | ICD-10-CM | POA: Diagnosis not present

## 2014-11-04 DIAGNOSIS — Z79899 Other long term (current) drug therapy: Secondary | ICD-10-CM | POA: Diagnosis not present

## 2014-11-04 DIAGNOSIS — I341 Nonrheumatic mitral (valve) prolapse: Secondary | ICD-10-CM | POA: Diagnosis not present

## 2014-11-04 DIAGNOSIS — I200 Unstable angina: Secondary | ICD-10-CM | POA: Diagnosis not present

## 2014-11-04 DIAGNOSIS — I472 Ventricular tachycardia: Secondary | ICD-10-CM | POA: Diagnosis not present

## 2014-11-04 DIAGNOSIS — I10 Essential (primary) hypertension: Secondary | ICD-10-CM | POA: Diagnosis not present

## 2014-11-04 DIAGNOSIS — I2510 Atherosclerotic heart disease of native coronary artery without angina pectoris: Secondary | ICD-10-CM | POA: Diagnosis not present

## 2014-11-04 DIAGNOSIS — I259 Chronic ischemic heart disease, unspecified: Secondary | ICD-10-CM | POA: Diagnosis not present

## 2014-11-04 DIAGNOSIS — R079 Chest pain, unspecified: Secondary | ICD-10-CM | POA: Diagnosis not present

## 2014-11-04 DIAGNOSIS — I25110 Atherosclerotic heart disease of native coronary artery with unstable angina pectoris: Secondary | ICD-10-CM | POA: Diagnosis not present

## 2014-11-04 DIAGNOSIS — Z8371 Family history of colonic polyps: Secondary | ICD-10-CM | POA: Diagnosis not present

## 2014-11-04 DIAGNOSIS — I5022 Chronic systolic (congestive) heart failure: Secondary | ICD-10-CM | POA: Diagnosis not present

## 2014-11-04 DIAGNOSIS — Z9889 Other specified postprocedural states: Secondary | ICD-10-CM | POA: Diagnosis not present

## 2014-11-04 HISTORY — PX: OTHER SURGICAL HISTORY: SHX169

## 2014-11-05 DIAGNOSIS — R55 Syncope and collapse: Secondary | ICD-10-CM | POA: Diagnosis not present

## 2014-11-05 DIAGNOSIS — I255 Ischemic cardiomyopathy: Secondary | ICD-10-CM | POA: Diagnosis not present

## 2014-11-05 DIAGNOSIS — I10 Essential (primary) hypertension: Secondary | ICD-10-CM | POA: Diagnosis not present

## 2014-11-05 DIAGNOSIS — M170 Bilateral primary osteoarthritis of knee: Secondary | ICD-10-CM | POA: Diagnosis not present

## 2014-11-05 DIAGNOSIS — E039 Hypothyroidism, unspecified: Secondary | ICD-10-CM | POA: Diagnosis not present

## 2014-11-05 DIAGNOSIS — I2510 Atherosclerotic heart disease of native coronary artery without angina pectoris: Secondary | ICD-10-CM | POA: Diagnosis not present

## 2014-11-05 DIAGNOSIS — E785 Hyperlipidemia, unspecified: Secondary | ICD-10-CM | POA: Diagnosis not present

## 2014-11-07 ENCOUNTER — Encounter: Payer: Self-pay | Admitting: Internal Medicine

## 2014-11-07 NOTE — Assessment & Plan Note (Signed)
Low carb diet and exercise.  Follow met b and a1c.

## 2014-11-07 NOTE — Assessment & Plan Note (Signed)
On thyroid replacement.  Follow tsh.  

## 2014-11-07 NOTE — Assessment & Plan Note (Signed)
Low cholesterol diet.  Remains on crestor.  Follow lipid panel and liver function tests.

## 2014-11-07 NOTE — Assessment & Plan Note (Signed)
Blood pressure as outlined.  Follow pressures.  Same medication regimen.  Follow.

## 2014-11-07 NOTE — Assessment & Plan Note (Signed)
Had high grade stenosis.  S/p stent x 2.  Continue risk factor modification.  Continue aspirin and plavix.  Continue to f/u with vascular surgery.

## 2014-11-07 NOTE — Assessment & Plan Note (Signed)
Seeing Dr Saralyn Pilar.  Has the sob with exertion.  Planning for heart catheterization tomorrow.  Continue aggressive risk factor modification.

## 2014-11-07 NOTE — Assessment & Plan Note (Signed)
Persistent worsening pain.  Needs surgery (per pt).  Will need to get his heart issues sorted through first.  Follow.

## 2014-11-07 NOTE — Assessment & Plan Note (Signed)
Persistent.  Saw neurology.  See Dr Lannie Fields note for details.  Desires no further w/up at this point.  Follow.

## 2014-11-11 ENCOUNTER — Telehealth: Payer: Self-pay | Admitting: Internal Medicine

## 2014-11-11 NOTE — Telephone Encounter (Signed)
Pt called to schedule a follow up visit from having 3 stents placed on 2/11 at [facility]. Please advise/msn

## 2014-11-12 NOTE — Telephone Encounter (Signed)
I can see him on 12/01/14 at 1:00.

## 2014-11-12 NOTE — Telephone Encounter (Signed)
Left message for pt to return my call.

## 2014-11-12 NOTE — Telephone Encounter (Signed)
How soon does he need to be seen.  He sees cardiology at [facility].  Usually they f/u after stents placed.  If he needs to see me, just need to see what is going on and how soon he needs the appt.  Thanks.

## 2014-11-12 NOTE — Telephone Encounter (Addendum)
Spoke to pt, states he is doing well, no acute problems. He was just told that he needed to follow up with his PCP in 30 days. Has appt with cardiologist 11/18/14. Currently, no open appts. Please advise where you would like to see him

## 2014-11-18 DIAGNOSIS — I340 Nonrheumatic mitral (valve) insufficiency: Secondary | ICD-10-CM | POA: Diagnosis not present

## 2014-11-18 DIAGNOSIS — I10 Essential (primary) hypertension: Secondary | ICD-10-CM | POA: Diagnosis not present

## 2014-11-18 DIAGNOSIS — I5022 Chronic systolic (congestive) heart failure: Secondary | ICD-10-CM | POA: Diagnosis not present

## 2014-11-18 DIAGNOSIS — I2510 Atherosclerotic heart disease of native coronary artery without angina pectoris: Secondary | ICD-10-CM | POA: Diagnosis not present

## 2014-11-18 NOTE — Telephone Encounter (Signed)
Can you call and schedule this patient please since I'm out the rest of this week. Thanks!

## 2014-11-18 NOTE — Telephone Encounter (Signed)
Left message to call the office to schedule appt.msn

## 2014-11-23 ENCOUNTER — Ambulatory Visit: Payer: PRIVATE HEALTH INSURANCE | Admitting: Internal Medicine

## 2014-11-23 NOTE — Telephone Encounter (Signed)
Left message to call the office to schedule appt.msn

## 2014-11-24 DIAGNOSIS — I2510 Atherosclerotic heart disease of native coronary artery without angina pectoris: Secondary | ICD-10-CM | POA: Diagnosis not present

## 2014-11-24 DIAGNOSIS — I340 Nonrheumatic mitral (valve) insufficiency: Secondary | ICD-10-CM | POA: Diagnosis not present

## 2014-11-24 DIAGNOSIS — I5022 Chronic systolic (congestive) heart failure: Secondary | ICD-10-CM | POA: Diagnosis not present

## 2014-11-24 DIAGNOSIS — I255 Ischemic cardiomyopathy: Secondary | ICD-10-CM | POA: Diagnosis not present

## 2014-11-29 NOTE — Telephone Encounter (Signed)
Left message to call the office to schedule appt.msn

## 2014-12-02 ENCOUNTER — Ambulatory Visit: Payer: Medicare Other | Admitting: Podiatry

## 2014-12-03 ENCOUNTER — Other Ambulatory Visit: Payer: Self-pay | Admitting: Internal Medicine

## 2014-12-29 ENCOUNTER — Other Ambulatory Visit: Payer: Self-pay | Admitting: Internal Medicine

## 2015-01-05 ENCOUNTER — Other Ambulatory Visit: Payer: PRIVATE HEALTH INSURANCE

## 2015-01-06 DIAGNOSIS — M170 Bilateral primary osteoarthritis of knee: Secondary | ICD-10-CM | POA: Diagnosis not present

## 2015-01-06 DIAGNOSIS — M25561 Pain in right knee: Secondary | ICD-10-CM | POA: Diagnosis not present

## 2015-01-06 DIAGNOSIS — M25562 Pain in left knee: Secondary | ICD-10-CM | POA: Diagnosis not present

## 2015-01-07 ENCOUNTER — Other Ambulatory Visit: Payer: Medicare Other

## 2015-01-07 ENCOUNTER — Ambulatory Visit (INDEPENDENT_AMBULATORY_CARE_PROVIDER_SITE_OTHER): Payer: Medicare Other | Admitting: Internal Medicine

## 2015-01-07 ENCOUNTER — Encounter: Payer: Self-pay | Admitting: Internal Medicine

## 2015-01-07 VITALS — BP 120/80 | HR 64 | Temp 97.9°F | Ht 68.0 in | Wt 196.5 lb

## 2015-01-07 DIAGNOSIS — H539 Unspecified visual disturbance: Secondary | ICD-10-CM

## 2015-01-07 DIAGNOSIS — M25561 Pain in right knee: Secondary | ICD-10-CM

## 2015-01-07 DIAGNOSIS — I2510 Atherosclerotic heart disease of native coronary artery without angina pectoris: Secondary | ICD-10-CM

## 2015-01-07 DIAGNOSIS — Z0289 Encounter for other administrative examinations: Secondary | ICD-10-CM

## 2015-01-07 DIAGNOSIS — R739 Hyperglycemia, unspecified: Secondary | ICD-10-CM | POA: Diagnosis not present

## 2015-01-07 DIAGNOSIS — D649 Anemia, unspecified: Secondary | ICD-10-CM

## 2015-01-07 DIAGNOSIS — I6529 Occlusion and stenosis of unspecified carotid artery: Secondary | ICD-10-CM | POA: Diagnosis not present

## 2015-01-07 DIAGNOSIS — M25562 Pain in left knee: Secondary | ICD-10-CM

## 2015-01-07 DIAGNOSIS — I10 Essential (primary) hypertension: Secondary | ICD-10-CM | POA: Diagnosis not present

## 2015-01-07 DIAGNOSIS — E7800 Pure hypercholesterolemia, unspecified: Secondary | ICD-10-CM

## 2015-01-07 DIAGNOSIS — E039 Hypothyroidism, unspecified: Secondary | ICD-10-CM

## 2015-01-07 LAB — BASIC METABOLIC PANEL
BUN: 8 mg/dL (ref 6–23)
CO2: 29 mEq/L (ref 19–32)
Calcium: 9.5 mg/dL (ref 8.4–10.5)
Chloride: 100 mEq/L (ref 96–112)
Creatinine, Ser: 0.84 mg/dL (ref 0.40–1.50)
GFR: 94.77 mL/min (ref >60.00)
Glucose, Bld: 97 mg/dL (ref 70–99)
Potassium: 4.7 mEq/L (ref 3.5–5.1)
Sodium: 134 mEq/L — ABNORMAL LOW (ref 135–145)

## 2015-01-07 LAB — HEPATIC FUNCTION PANEL
ALT: 9 U/L (ref 0–53)
AST: 14 U/L (ref 0–37)
Albumin: 4.1 g/dL (ref 3.5–5.2)
Alkaline Phosphatase: 83 U/L (ref 39–117)
Bilirubin, Direct: 0.1 mg/dL (ref 0.0–0.3)
Total Bilirubin: 0.7 mg/dL (ref 0.2–1.2)
Total Protein: 6.9 g/dL (ref 6.0–8.3)

## 2015-01-07 LAB — LIPID PANEL
CHOLESTEROL: 130 mg/dL (ref 0–200)
HDL: 44.4 mg/dL (ref >39.00)
LDL Cholesterol: 63 mg/dL (ref 0–99)
NONHDL: 85.6
Total CHOL/HDL Ratio: 3
Triglycerides: 115 mg/dL (ref 0.0–149.0)
VLDL: 23 mg/dL (ref 0.0–40.0)

## 2015-01-07 LAB — HEMOGLOBIN A1C: Hgb A1c MFr Bld: 5.9 % (ref 4.6–6.5)

## 2015-01-07 NOTE — Progress Notes (Signed)
Pre visit review using our clinic review tool, if applicable. No additional management support is needed unless otherwise documented below in the visit note. 

## 2015-01-08 ENCOUNTER — Other Ambulatory Visit: Payer: Self-pay | Admitting: Internal Medicine

## 2015-01-08 DIAGNOSIS — E871 Hypo-osmolality and hyponatremia: Secondary | ICD-10-CM

## 2015-01-08 NOTE — Progress Notes (Signed)
Order placed for f/u sodium check.   

## 2015-01-09 ENCOUNTER — Other Ambulatory Visit: Payer: Self-pay | Admitting: Internal Medicine

## 2015-01-10 ENCOUNTER — Encounter: Payer: Self-pay | Admitting: *Deleted

## 2015-01-11 ENCOUNTER — Encounter
Admit: 2015-01-11 | Disposition: A | Discharge status: Home / Self Care | Payer: Self-pay | Attending: Cardiology | Admitting: Cardiology

## 2015-01-11 DIAGNOSIS — Z9861 Coronary angioplasty status: Secondary | ICD-10-CM | POA: Diagnosis not present

## 2015-01-11 NOTE — Progress Notes (Signed)
Patient ID: Daniel Austin, male   DOB: Oct 24, 1940, 74 y.o.   MRN: [id number]   Subjective:    Patient ID: Daniel Austin, male    DOB: Jul 05, 1941, 74 y.o.   MRN: [id number]  HPI  Patient here for a scheduled follow up.  Was recently admitted at [facility] 2/11/6 for stent placement.  S/p cardiac cath 2/11/6 which revealed three-vessel CAD with heavily calcified proximal, mid and distal LAD disease.  Transferred to [place] where he underwent successful drug-eluting stent to mid LAD.  ECHO 10/11/14 revealed EF of 45%.  Reports decreased energy.  Planning to attend cardiac rehab.  Has OA of both knees.  Increased pain and inability to stand and walk for any increased length of time.  Needs TKR.  Unable to do now given recent heart issues and stent placement.  Very limited in ability to walk and be active.  Unable to perform his current job duties.  Has form for FMLA.  Discussed short term disability.  Hopefully after cardiac rehab, will be able to be more functional.     Past Medical History  Diagnosis Date  . Hypercholesterolemia   . Hypothyroidism   . Hypertension   . Fractures, multiple     Metal rod in left foot  . CAD (coronary artery disease)   . Carotid artery stenosis     high-grade symptomatic right sided s/p stent twice  . Syncope     second degree heart block s/p St Jude's pacemaker placement 12/08  . Anemia   . Ulcer of gastric fundus   . Hyperglycemia     Outpatient Encounter Prescriptions as of 01/07/2015  Medication Sig  . Aspirin 81 MG EC tablet Take 81 mg by mouth daily.  . clopidogrel (PLAVIX) 75 MG tablet TAKE 1 TABLET (75 MG TOTAL) BY MOUTH DAILY.  Marland Kitchen CRESTOR 20 MG tablet Take 20 mg by mouth daily.  . fexofenadine (ALLEGRA) 180 MG tablet Take 180 mg by mouth daily.  Marland Kitchen levothyroxine (SYNTHROID, LEVOTHROID) 112 MCG tablet TAKE 1 TABLET (112 MCG TOTAL) BY MOUTH DAILY.  Marland Kitchen lisinopril (PRINIVIL,ZESTRIL) 40 MG tablet TAKE 1 TABLET (40 MG TOTAL) BY MOUTH DAILY.  Marland Kitchen LORazepam  (ATIVAN) 1 MG tablet TAKE 1 TABLET BY MOUTH AT BEDTIME AS NEEDED FOR ANXIETY  . metoprolol tartrate (LOPRESSOR) 25 MG tablet Take 1 tablet (25 mg total) by mouth 2 (two) times daily.  . naproxen sodium (ANAPROX) 220 MG tablet Take 220 mg by mouth as needed (knee pain).  . nitroGLYCERIN (NITROSTAT) 0.4 MG SL tablet Place 0.4 mg under the tongue every 5 (five) minutes as needed. If a third tablet is needed please call 911.  . pantoprazole (PROTONIX) 40 MG tablet TAKE 1 TABLET (40 MG TOTAL) BY MOUTH DAILY.  . [DISCONTINUED] citalopram (CELEXA) 20 MG tablet Take 1 tablet (20 mg total) by mouth daily.  . [DISCONTINUED] rosuvastatin (CRESTOR) 10 MG tablet TAKE 1 TABLET (10 MG TOTAL) BY MOUTH DAILY.    Review of Systems  Constitutional: Positive for fatigue. Negative for appetite change and unexpected weight change.  HENT: Negative for congestion and sinus pressure.   Eyes: Negative for visual disturbance.  Respiratory: Negative for cough, chest tightness and shortness of breath.   Cardiovascular: Negative for chest pain, palpitations and leg swelling.  Gastrointestinal: Negative for nausea, vomiting, abdominal pain and diarrhea.  Musculoskeletal:       Persistent knee pain.  Increased.  Limits his activity.   Neurological: Negative for dizziness, light-headedness and headaches.  Objective:    Physical Exam  Constitutional: He appears well-developed and well-nourished. No distress.  HENT:  Nose: Nose normal.  Mouth/Throat: Oropharynx is clear and moist.  Neck: Neck supple. No thyromegaly present.  Cardiovascular: Normal rate and regular rhythm.   Pulmonary/Chest: Effort normal and breath sounds normal. No respiratory distress.  Abdominal: Soft. Bowel sounds are normal. There is no tenderness.  Musculoskeletal: He exhibits no edema.  Lymphadenopathy:    He has no cervical adenopathy.  Skin: No rash noted. No erythema.  Psychiatric: He has a normal mood and affect. His behavior is  normal.    BP 120/80 mmHg  Pulse 64  Temp(Src) 97.9 F (36.6 C) (Oral)  Ht _0  (1.727 m)  Wt 196 lb 8 oz (89.132 kg)  BMI 29.88 kg/m2  SpO2 99% Wt Readings from Last 3 Encounters:  01/07/15 196 lb 8 oz (89.132 kg)  11/03/14 201 lb 8 oz (91.4 kg)  09/29/14 202 lb 8 oz (91.853 kg)     Lab Results  Component Value Date   WBC 4.6 09/28/2014   HGB 13.5 09/28/2014   HCT 41.1 09/28/2014   PLT 262.0 09/28/2014   GLUCOSE 97 01/07/2015   CHOL 130 01/07/2015   TRIG 115.0 01/07/2015   HDL 44.40 01/07/2015   LDLCALC 63 01/07/2015   ALT 9 01/07/2015   AST 14 01/07/2015   NA 134* 01/07/2015   K 4.7 01/07/2015   CL 100 01/07/2015   CREATININE 0.84 01/07/2015   BUN 8 01/07/2015   CO2 29 01/07/2015   TSH 1.53 05/25/2014   PSA 0.28 05/28/2014   HGBA1C 5.9 01/07/2015   MICROALBUR 1.6 05/25/2014      Assessment & Plan:   Problem List Items Addressed This Visit    Anemia    EGD and colonoscopy as outlined.  hgb last checked - wnl.       Change in vision    No reoccurrence.  Follow.        Coronary atherosclerosis of native coronary artery - Primary    Cath 11/04/14 - revealed three-vessel CAD as outlined.  S/p stent to mid LAD.  On plavix.  Seeing Dr Saralyn Pilar.  Planning to start cardiac rehab soon.  Unable to work currently.  Hopefully after rehab, will be stronger and can return to work.  Follow.        Relevant Medications   CRESTOR 20 MG tablet   Encounter for completion of form with patient    FMLA form completion.       Essential hypertension, benign    Blood pressure as outlined.  Same medication regimen.  Follow.        Relevant Medications   CRESTOR 20 MG tablet   Hyperglycemia    Low carb diet and exercise.  Follow met b and a1c.        Hypothyroidism    On thyroid replacement.  Follow tsh.       Knee pain, bilateral    Worsening pain.  Limiting his activity.  Unable to return to work at this time.  See above.  Needs surgery.  Will have to wait  since just had stent placed.  Rehab as outlined.  Follow.       Pure hypercholesterolemia    Low cholesterol diet and exercise.  On crestor.  Follow lipid panel and liver function tests.       Relevant Medications   CRESTOR 20 MG tablet     I spent 25 minutes with  the patient and more than 50% of the time was spent in consultation regarding the above.     Einar Pheasant, MD

## 2015-01-12 ENCOUNTER — Other Ambulatory Visit: Payer: Self-pay | Admitting: Internal Medicine

## 2015-01-12 ENCOUNTER — Encounter: Payer: Self-pay | Admitting: Internal Medicine

## 2015-01-12 DIAGNOSIS — Z0289 Encounter for other administrative examinations: Secondary | ICD-10-CM | POA: Insufficient documentation

## 2015-01-12 NOTE — Assessment & Plan Note (Signed)
No reoccurrence.  Follow.

## 2015-01-12 NOTE — Assessment & Plan Note (Signed)
Worsening pain.  Limiting his activity.  Unable to return to work at this time.  See above.  Needs surgery.  Will have to wait since just had stent placed.  Rehab as outlined.  Follow.

## 2015-01-12 NOTE — Assessment & Plan Note (Signed)
Low carb diet and exercise.  Follow met b and a1c.   

## 2015-01-12 NOTE — Assessment & Plan Note (Signed)
Cath 11/04/14 - revealed three-vessel CAD as outlined.  S/p stent to mid LAD.  On plavix.  Seeing Dr Saralyn Pilar.  Planning to start cardiac rehab soon.  Unable to work currently.  Hopefully after rehab, will be stronger and can return to work.  Follow.

## 2015-01-12 NOTE — Assessment & Plan Note (Signed)
EGD and colonoscopy as outlined.  hgb last checked - wnl.

## 2015-01-12 NOTE — Assessment & Plan Note (Signed)
Blood pressure as outlined.  Same medication regimen.  Follow.

## 2015-01-12 NOTE — Assessment & Plan Note (Signed)
On thyroid replacement.  Follow tsh.  

## 2015-01-12 NOTE — Assessment & Plan Note (Signed)
FMLA form completion.

## 2015-01-12 NOTE — Assessment & Plan Note (Signed)
Low cholesterol diet and exercise.  On crestor.  Follow lipid panel and liver function tests.  

## 2015-01-20 DIAGNOSIS — Z9861 Coronary angioplasty status: Secondary | ICD-10-CM | POA: Diagnosis not present

## 2015-01-24 ENCOUNTER — Encounter: Payer: Medicare Other | Attending: Internal Medicine | Admitting: *Deleted

## 2015-01-24 DIAGNOSIS — Z9861 Coronary angioplasty status: Secondary | ICD-10-CM | POA: Insufficient documentation

## 2015-01-24 NOTE — Progress Notes (Signed)
Daily Session Note  Patient Details  Name: Daniel Austin MRN: [id number] Date of Birth: 09-03-1941 Referring Provider:  Isaias Cowman, MD  Encounter Date: 01/24/2015  Check In:     Session Check In - 01/24/15 1132    Check-In   Staff Present Heath Lark RN, BSN, CCRP; Earlean Shawl BS, ACSM CEP, Exercise Physiologist; Candiss Norse MS, ACSM CEP, Exercise Physiologist   ER physicians immediately available to respond to emergencies See telemetry face sheet for immediately available ER MD   Warm-up and Cool-down Performed on first and last piece of equipment   VAD Patient? No   Pain Assessment   Currently in Pain? No/denies   Multiple Pain Sites No         Goals Met:  Proper associated with RPD/PD & O2 Sat Independence with exercise equipment Exercise tolerated well Personal goals reviewed  Goals Unmet:  Not Applicable  Goals Comments:    Dr. Emily Filbert is Medical Director for [place] and [facility].

## 2015-01-26 ENCOUNTER — Encounter: Payer: Medicare Other | Admitting: *Deleted

## 2015-01-26 ENCOUNTER — Other Ambulatory Visit (INDEPENDENT_AMBULATORY_CARE_PROVIDER_SITE_OTHER): Payer: Medicare Other

## 2015-01-26 DIAGNOSIS — Z9861 Coronary angioplasty status: Secondary | ICD-10-CM | POA: Diagnosis not present

## 2015-01-26 DIAGNOSIS — E871 Hypo-osmolality and hyponatremia: Secondary | ICD-10-CM | POA: Diagnosis not present

## 2015-01-26 LAB — SODIUM: SODIUM: 134 meq/L — AB (ref 135–145)

## 2015-01-26 NOTE — Progress Notes (Signed)
Daily Session Note  Patient Details  Name: Daniel Austin MRN: [id number] Date of Birth: 1941-02-11 Referring Provider:  Einar Pheasant, MD  Encounter Date: 01/26/2015  Check In:     Session Check In - 01/26/15 1006    Check-In   Staff Present Heath Lark RN, BSN, CCRP; Remo Lipps Way BS, ACSM EP-C, Exercise Physiologist; Candiss Norse MS, ACSM CEP, Exercise Physiologist   ER physicians immediately available to respond to emergencies See telemetry face sheet for immediately available ER MD   Warm-up and Cool-down Performed on first and last piece of equipment   VAD Patient? No   Pain Assessment   Currently in Pain? No/denies   Multiple Pain Sites No         Goals Met:  Proper associated with RPD/PD & O2 Sat Independence with exercise equipment Exercise tolerated well Personal goals reviewed  Goals Unmet:  Not Applicable  Goals Comments: No cardiac symptoms reported; daily exercise goals completed   Dr. Emily Filbert is Medical Director for [place] and [facility].

## 2015-01-28 ENCOUNTER — Encounter: Payer: Medicare Other | Admitting: *Deleted

## 2015-01-28 ENCOUNTER — Encounter: Payer: Self-pay | Admitting: *Deleted

## 2015-01-28 DIAGNOSIS — Z9861 Coronary angioplasty status: Secondary | ICD-10-CM

## 2015-01-28 NOTE — Progress Notes (Signed)
Daily Session Note  Patient Details  Name: BENTON TOOKER MRN: [id number] Date of Birth: Jan 17, 1941 Referring Provider:  Einar Pheasant, MD  Encounter Date: 01/28/2015  Check In:     Session Check In - 01/28/15 0930    Check-In   Staff Present Heath Lark RN, BSN, CCRP;Mikaeel Petrow RN, Drusilla Kanner MS, ACSM CEP Exercise Physiologist         Goals Met:  Proper associated with RPD/PD & O2 Sat Independence with exercise equipment Personal goals reviewed  Goals Unmet:  Not Applicable  Goals Comments:    Dr. Emily Filbert is Medical Director for [place] and [facility].

## 2015-01-31 ENCOUNTER — Encounter: Payer: Medicare Other | Admitting: *Deleted

## 2015-01-31 DIAGNOSIS — Z9861 Coronary angioplasty status: Secondary | ICD-10-CM

## 2015-01-31 NOTE — Progress Notes (Signed)
Daily Session Note  Patient Details  Name: Daniel Austin MRN: [id number] Date of Birth: 1941-02-05 Referring Provider:  Einar Pheasant, MD  Encounter Date: 01/31/2015  Check In:     Session Check In - 01/31/15 1055    Check-In   Staff Present Candiss Norse MS, ACSM CEP Exercise Physiologist;Breven Guidroz Alfonso Patten, ACSM CEP Exercise Physiologist;Susanne Bice RN, BSN, [place]   ER physicians immediately available to respond to emergencies See telemetry face sheet for immediately available ER MD   Warm-up and Cool-down Performed on first and last piece of equipment   VAD Patient? No   Pain Assessment   Currently in Pain? No/denies   Multiple Pain Sites No         Goals Met:  Proper associated with RPD/PD & O2 Sat Independence with exercise equipment Exercise tolerated well No cardiac symptoms reported with exercise.  Goals Unmet:  Not Applicable  Goals Comments:    Dr. Emily Filbert is Medical Director for [place] and [facility].

## 2015-02-01 ENCOUNTER — Ambulatory Visit (INDEPENDENT_AMBULATORY_CARE_PROVIDER_SITE_OTHER): Payer: Medicare Other | Admitting: Podiatry

## 2015-02-01 DIAGNOSIS — B351 Tinea unguium: Secondary | ICD-10-CM | POA: Diagnosis not present

## 2015-02-01 DIAGNOSIS — M79676 Pain in unspecified toe(s): Secondary | ICD-10-CM | POA: Diagnosis not present

## 2015-02-01 NOTE — Progress Notes (Signed)
Patient ID: Daniel Austin, male   DOB: 07-17-1941, 74 y.o.   MRN: [id number]  Subjective: Daniel Austin presents to the office today for painful, elongated nails which he is unable to trim himself. Denies any redness or drainage from the nail sites. Denies any changes since last appointment. No other complaints today.  Objective: AAO x3, NAD DP/PT pulses palpable 2/4 b/l. CRT < 3sec Protective sensation intact with Daniel Austin monofilament Nails dystrophic, brittle, hypertrophic, elongated, yellow discoloration. No surrounding erythema or drainage. There is tenderness to palpation over nails 1-5 bilaterally.  No open lesions or pre-ulcerative lesions. No other areas of tenderness to bilateral lower extremities.  No leg pain, swelling, warmth   Assessment: 74 year old male with symptomatic onychomycosis.  Plan: -Treatment options discussed including alternatives, risks, complications. -Nails sharply debrided W44 without complications. -Daily foot inspection discussed with the patient. -Followup in 3 months or sooner if any problems are to arise or any changes symptoms.

## 2015-02-02 ENCOUNTER — Encounter: Payer: Medicare Other | Admitting: *Deleted

## 2015-02-02 DIAGNOSIS — Z9861 Coronary angioplasty status: Secondary | ICD-10-CM

## 2015-02-02 NOTE — Progress Notes (Signed)
Daily Session Note  Patient Details  Name: Daniel Austin MRN: [id number] Date of Birth: 1941-07-26 Referring Provider:  Einar Pheasant, MD  Encounter Date: 02/02/2015  Check In:     Session Check In - 02/02/15 0848    Check-In   Staff Present Heath Lark RN, BSN, CCRP;Adonia Porada Dillard Essex MS, ACSM CEP Exercise Physiologist;Steven Way BS, ACSM EP-C, Exercise Physiologist   ER physicians immediately available to respond to emergencies See telemetry face sheet for immediately available ER MD   Medication changes reported     No   Fall or balance concerns reported    No   Warm-up and Cool-down Performed on first and last piece of equipment   VAD Patient? No   Pain Assessment   Currently in Pain? No/denies   Multiple Pain Sites No           Exercise Prescription Changes - 02/02/15 0800    Exercise Review   Progression Yes   Recumbant Bike   Level 6   RPM 70   NuStep   Level 5   Watts 70      Goals Met:  Proper associated with RPD/PD & O2 Sat Independence with exercise equipment No report of cardiac concerns or symptoms  Goals Unmet:  Not Applicable  Goals Comments: No cardiac symptoms reported; daily exercise goals completed    Dr. Emily Filbert is Medical Director for [place] and [facility].

## 2015-02-04 ENCOUNTER — Encounter: Payer: Medicare Other | Admitting: *Deleted

## 2015-02-04 DIAGNOSIS — Z9861 Coronary angioplasty status: Secondary | ICD-10-CM | POA: Diagnosis not present

## 2015-02-04 NOTE — Progress Notes (Signed)
Daily Session Note  Patient Details  Name: Daniel Austin MRN: [id number] Date of Birth: 12-May-1941 Referring Provider:  Einar Pheasant, MD  Encounter Date: 02/04/2015  Check In:     Session Check In - 02/04/15 0911    Check-In   Staff Present Gerlene Burdock RN, BSN;Renee Dillard [place] MS, ACSM CEP Exercise Physiologist;Dawnetta Copenhaver RN, BSN, [place]   ER physicians immediately available to respond to emergencies See telemetry face sheet for immediately available ER MD   Medication changes reported     No   Fall or balance concerns reported    No   Warm-up and Cool-down Performed on first and last piece of equipment   VAD Patient? No   Pain Assessment   Currently in Pain? No/denies         Goals Met:  Independence with exercise equipment Exercise tolerated well No report of cardiac concerns or symptoms Strength training completed today  Goals Unmet:  Not Applicable  Goals Comments:    Dr. Emily Filbert is Medical Director for [place] and [facility].

## 2015-02-07 ENCOUNTER — Encounter: Payer: Medicare Other | Admitting: *Deleted

## 2015-02-07 DIAGNOSIS — Z9861 Coronary angioplasty status: Secondary | ICD-10-CM

## 2015-02-07 NOTE — Progress Notes (Unsigned)
Daily Session Note  Patient Details  Name: ISIAH SCHEEL MRN: [id number] Date of Birth: 1941-06-02 Referring Provider:  Einar Pheasant, MD  Encounter Date: 02/07/2015  Check In:     Session Check In - 02/07/15 0834    Check-In   Staff Present Heath Lark RN, BSN, CCRP;Kelly Hayes BS, ACSM CEP Exercise Physiologist;Ashely Joshua Dillard [place] MS, ACSM CEP Exercise Physiologist   ER physicians immediately available to respond to emergencies See telemetry face sheet for immediately available ER MD   Medication changes reported     No   Fall or balance concerns reported    No   Warm-up and Cool-down Performed on first and last piece of equipment   VAD Patient? No   Pain Assessment   Currently in Pain? No/denies   Multiple Pain Sites No         Goals Met:  Independence with exercise equipment Exercise tolerated well No report of cardiac concerns or symptoms  Goals Unmet:  Not Applicable  Goals Comments:   Dr. Emily Filbert is Medical Director for [place] and [facility].

## 2015-02-09 DIAGNOSIS — Z9861 Coronary angioplasty status: Secondary | ICD-10-CM

## 2015-02-09 NOTE — Progress Notes (Signed)
Daily Session Note  Patient Details  Name: JALEIL RENWICK MRN: [id number] Date of Birth: 15-Feb-1941 Referring Provider:  Einar Pheasant, MD  Encounter Date: 02/09/2015  Check In:     Session Check In - 02/09/15 0830    Check-In   Staff Present Heath Lark RN, BSN, CCRP;Trynity Skousen BS, ACSM EP-C, Exercise Physiologist;Renee Dillard [place] MS, ACSM CEP Exercise Physiologist   ER physicians immediately available to respond to emergencies See telemetry face sheet for immediately available ER MD   Medication changes reported     No   Fall or balance concerns reported    No   Warm-up and Cool-down Performed on first and last piece of equipment   VAD Patient? No   Pain Assessment   Currently in Pain? No/denies         Goals Met:  Proper associated with RPD/PD & O2 Sat Exercise tolerated well No report of cardiac concerns or symptoms Strength training completed today  Goals Unmet:  Not Applicable  Goals Comments:    Dr. Emily Filbert is Medical Director for [place] and [facility].

## 2015-02-10 ENCOUNTER — Telehealth: Payer: Self-pay | Admitting: *Deleted

## 2015-02-10 NOTE — Telephone Encounter (Signed)
I have placed the original form in the green folder

## 2015-02-10 NOTE — Telephone Encounter (Signed)
Daniel Austin with [employer] called to check on the status of the paperwork or more detailed dates and information of visits, etc. You may contact Daniel Austin directly at [phone] ext. [phone]. Pt has been calling his stating that he really needs to get this going for some financial relief.

## 2015-02-10 NOTE — Telephone Encounter (Signed)
I do not have another form on him.  I completed what Daniel Austin gave me and have not received anything else to complete.

## 2015-02-11 ENCOUNTER — Encounter: Payer: Medicare Other | Admitting: *Deleted

## 2015-02-11 DIAGNOSIS — Z9861 Coronary angioplasty status: Secondary | ICD-10-CM | POA: Diagnosis not present

## 2015-02-11 NOTE — Telephone Encounter (Signed)
I looked back over the original form.  If they can give me specifics of what is needed.  They probably will need to get information from his cardiologist as well.   Thanks

## 2015-02-11 NOTE — Telephone Encounter (Signed)
Spoke to [place] at [employer]. He is looking for more specific out of work dates and a more specific diagnosis. He says on 02/04/15 2 more forms were faxed that were more specific that needed completion. States he talked to [place] about these forms and that they were given to Dr. Nicki Reaper. Forms were an Attending Physician Statement and a Medical Update Form. These were not included in the [facility] paperwork Dr. Nicki Reaper gave me this morning. Nyra Capes I would check on that with both [place] and Dr. Nicki Reaper. Also requested he refax them just in case.

## 2015-02-11 NOTE — Telephone Encounter (Signed)
Forms placed in Scott's red folder

## 2015-02-13 ENCOUNTER — Encounter: Payer: Self-pay | Admitting: *Deleted

## 2015-02-13 ENCOUNTER — Other Ambulatory Visit: Payer: Self-pay | Admitting: Internal Medicine

## 2015-02-13 NOTE — Progress Notes (Signed)
Input data from previous EMR to update the Individualized Treatment Plan.

## 2015-02-14 ENCOUNTER — Encounter: Payer: Medicare Other | Admitting: *Deleted

## 2015-02-14 DIAGNOSIS — Z9861 Coronary angioplasty status: Secondary | ICD-10-CM | POA: Diagnosis not present

## 2015-02-14 NOTE — Progress Notes (Signed)
Daily Session Note  Patient Details  Name: Daniel Austin MRN: [id number] Date of Birth: 28-Dec-1940 Referring Provider:  Einar Pheasant, MD  Encounter Date: 02/14/2015  Check In:     Session Check In - 02/14/15 0852    Check-In   Staff Present Earlean Shawl BS, ACSM CEP Exercise Physiologist;Ulyssa Walthour Dillard Essex MS, ACSM CEP Exercise Physiologist;Susanne Bice RN, BSN, CCRP   ER physicians immediately available to respond to emergencies See telemetry face sheet for immediately available ER MD   Medication changes reported     No   Fall or balance concerns reported    No   Warm-up and Cool-down Performed on first and last piece of equipment   VAD Patient? No   Pain Assessment   Currently in Pain? No/denies   Multiple Pain Sites No         Goals Met:  Independence with exercise equipment Exercise tolerated well No report of cardiac concerns or symptoms  Goals Unmet:  Not Applicable  Goals Comments:    Dr. Emily Filbert is Medical Director for [place] and [facility].

## 2015-02-14 NOTE — Telephone Encounter (Signed)
Rx phoned into pharmacy.

## 2015-02-14 NOTE — Telephone Encounter (Signed)
Last visit 01/07/15, ok refill?

## 2015-02-14 NOTE — Telephone Encounter (Signed)
ok'd lorazepam #[id number] with one refill.

## 2015-02-15 ENCOUNTER — Encounter: Payer: Self-pay | Admitting: *Deleted

## 2015-02-15 NOTE — Progress Notes (Signed)
Cardiac Individual Treatment Plan  Patient Details  Name: Daniel Austin MRN: [id number] Date of Birth: 1940/10/14 Referring Provider:  Dr. [place] Desanctis DIagnosis  PTCA Initial Encounter Date:    Patient's Home Medications on Admission:  Current outpatient prescriptions:  .  Aspirin 81 MG EC tablet, Take 81 mg by mouth daily., Disp: , Rfl:  .  citalopram (CELEXA) 20 MG tablet, TAKE 1 TABLET (20 MG TOTAL) BY MOUTH DAILY., Disp: 90 tablet, Rfl: 0 .  clopidogrel (PLAVIX) 75 MG tablet, TAKE 1 TABLET (75 MG TOTAL) BY MOUTH DAILY., Disp: 30 tablet, Rfl: 5 .  CRESTOR 20 MG tablet, Take 20 mg by mouth daily., Disp: , Rfl: 11 .  fexofenadine (ALLEGRA) 180 MG tablet, Take 180 mg by mouth daily., Disp: , Rfl:  .  levothyroxine (SYNTHROID, LEVOTHROID) 112 MCG tablet, TAKE 1 TABLET (112 MCG TOTAL) BY MOUTH DAILY., Disp: 30 tablet, Rfl: 5 .  lisinopril (PRINIVIL,ZESTRIL) 40 MG tablet, TAKE 1 TABLET (40 MG TOTAL) BY MOUTH DAILY., Disp: 30 tablet, Rfl: 5 .  LORazepam (ATIVAN) 1 MG tablet, TAKE 1 TABLET BY MOUTH AT BEDTIME AS NEEDED FOR ANXIETY, Disp: 30 tablet, Rfl: 1 .  metoprolol tartrate (LOPRESSOR) 25 MG tablet, Take 1 tablet (25 mg total) by mouth 2 (two) times daily., Disp: 180 tablet, Rfl: 3 .  naproxen sodium (ANAPROX) 220 MG tablet, Take 220 mg by mouth as needed (knee pain)., Disp: , Rfl:  .  niacin-lovastatin (ADVICOR) 500-20 MG 24 hr tablet, Take 150 tablets by mouth at bedtime., Disp: , Rfl:  .  nitroGLYCERIN (NITROSTAT) 0.4 MG SL tablet, Place 0.4 mg under the tongue every 5 (five) minutes as needed. If a third tablet is needed please call 911., Disp: , Rfl:  .  pantoprazole (PROTONIX) 40 MG tablet, TAKE 1 TABLET (40 MG TOTAL) BY MOUTH DAILY., Disp: 30 tablet, Rfl: 5  Past Medical History: Past Medical History  Diagnosis Date  . Hypercholesterolemia   . Hypothyroidism   . Hypertension   . Fractures, multiple     Metal rod in left foot  . CAD (coronary artery disease)   . Carotid  artery stenosis     high-grade symptomatic right sided s/p stent twice  . Syncope     second degree heart block s/p [employer] pacemaker placement 12/08  . Anemia   . Ulcer of gastric fundus   . Hyperglycemia     Tobacco Use: History  Smoking status  . Never Smoker   Smokeless tobacco  . Never Used    Labs: Recent Review Flowsheet Data    Labs for ITP Cardiac and Pulmonary Rehab Latest Ref Rng 09/02/2013 12/31/2013 05/25/2014 09/28/2014 01/07/2015   Cholestrol 0 - 200 mg/dL 159 154 141 159 130   LDLCALC 0 - 99 mg/dL 91 96 80 87 63   HDL >39.00 mg/dL 49.60 43.40 44.40 44.90 44.40   Trlycerides 0.0 - 149.0 mg/dL 93.0 71.0 81.0 134.0 115.0   Hemoglobin A1c 4.6 - 6.5 % 6.0 6.1 6.0 6.0 5.9       Exercise Target Goals:    Exercise Program Goal: Individual exercise prescription set with THRR, safety & activity barriers. Participant demonstrates ability to understand and report RPE using BORG scale, to self-measure pulse accurately, and to acknowledge the importance of the exercise prescription.  Exercise Prescription Goal: Starting with aerobic activity 30 plus minutes a day, 3 days per week for initial exercise prescription. Provide home exercise prescription and guidelines that participant acknowledges understanding prior to  discharge.  Activity Barriers & Risk Stratification:     Activity Barriers & Risk Stratification - 02/13/15 1124    Activity Barriers & Risk Stratification   Activity Barriers Arthritis;Joint Problems;Assistive Device;Other (comment)   Comments 2 canes and knee braces for arthritis in both knees   Risk Stratification High      6 Minute Walk:     6 Minute Walk      01/11/15 1125       6 Minute Walk   Phase Initial     Distance 418 feet     Walk Time 6 minutes     Resting HR 86 bpm     Resting BP 148/94 mmHg     Max Ex. HR 86 bpm     Max Ex. BP 150/100 mmHg     RPE 9     Symptoms Yes (comment)     Comments NO symptoms   used NUSTEP         Initial Exercise Prescription:   Exercise Prescription Changes:     Exercise Prescription Changes      02/02/15 0800 02/09/15 0800 02/15/15 0700       Exercise Review   Progression Yes Yes Yes     Response to Exercise   Blood Pressure (Admit)   140/84 mmHg     Blood Pressure (Exercise)   126/72 mmHg     Blood Pressure (Exit)   114/80 mmHg     Heart Rate (Admit)   77 bpm     Heart Rate (Exercise)   83 bpm     Heart Rate (Exit)   71 bpm     Rating of Perceived Exertion (Exercise)   12     Frequency   Add 1 additional day to program exercise sessions.     Duration   Progress to 50 minutes of aerobic without signs/symptoms of physical distress     Intensity   THRR unchanged     Progression   Continue progressive overload as per policy without signs/symptoms or physical distress.     Resistance Training   Training Prescription   Yes     Weight   3     Reps   10-12     Interval Training   Interval Training   Yes     Equipment   NuStep;Recumbant Bike     Recumbant Bike   Level 6  6     RPM 70  70     Minutes   25     NuStep   Level 5  6     Watts 70  70     Minutes   20        Discharge Exercise Prescription:     Discharge Prescription - 02/09/15 0900    Exercise Review   Progression Yes   Home Exercise Plan   Plans to continue exercise at Home      Nutrition:  Target Goals: Understanding of nutrition guidelines, daily intake of sodium <1543m, cholesterol <2021m calories 30% from fat and 7% or less from saturated fats, daily to have 5 or more servings of fruits and vegetables.  Biometrics:     Pre Biometrics - 01/11/15 1127    Pre Biometrics   Height _0  (1.727 m)   Weight 198 lb 14.4 oz (90.22 kg)   Waist Circumference 41 inches   Hip Circumference 43 inches   Waist to Hip Ratio 0.95 %   BMI (Calculated) 30.3  Nutrition Therapy Plan and Nutrition Goals:   Nutrition Discharge: Rate Your Plate Scores:   Nutrition Goals  Re-Evaluation:     Nutrition Goals Re-Evaluation      02/14/15 0926           Personal Goal #1 Re-Evaluation   Personal Goal #1 Daniel Austin doesn't feel he needs to meet with the dietician. Daniel Austin reports his wife cooks healthy. He said he cut out salt 10 years ago and uses pepper instead.        Goal Progress Seen Yes          Psychosocial: Target Goals: Acknowledge presence or absence of depression, maximize coping skills, provide positive support system. Participant is able to verbalize types and ability to use techniques and skills needed for reducing stress and depression.  Initial Review & Psychosocial Screening:   Quality of Life Scores:     Quality of Life - 01/11/15 1128    Quality of Life Scores   Health/Function Pre 15.93 %   Socioeconomic Pre 25.63 %   Psych/Spiritual Pre 20.43 %   Family Pre 23.9 %   GLOBAL Pre 20.19 %      PHQ-9:     Recent Review Flowsheet Data    Depression screen [facility] 2/9 01/07/2015 01/04/2014 09/10/2013 09/10/2013   Decreased Interest 3 0 0 0   Down, Depressed, Hopeless 1 0 1 2   PHQ - 2 Score 4 0 1 2   Altered sleeping 3 - 0 -   Tired, decreased energy 3 - 2 -   Change in appetite 2 - 0 -   Feeling bad or failure about yourself  3 - 0 -   Trouble concentrating 1 - 0 -   Moving slowly or fidgety/restless 0 - 0 -   Suicidal thoughts 0 - 0 -   PHQ-9 Score 16 - 3 -   Difficult doing work/chores Somewhat difficult - - -      Psychosocial Evaluation and Intervention:     Psychosocial Evaluation - 02/07/15 0959    Psychosocial Evaluation & Interventions   Interventions Stress management education;Relaxation education;Encouraged to exercise with the program and follow exercise prescription   Comments Counselor  met today with Mr. Sudbury for initial psychosocial evaluation.  He is a 74 year old hard-working gentleman who has had heart problems since 2008.  He has a strong support system with a spouse of 37 years, an adult daughter who lives  close by and is actively involved in his faith community.  Mr. Czarnecki was walking with (2) knee braces and the use of (2) canes and reports needing "knee Replacement" when he completes this program.  He states he has a history of depression and anxiety symptoms and has been on medications since 2008, which he reports work well for him currently with no current symptoms.  His PHQ-9 was "83" and counselor reviewed that with Mr. Monteverde reporting since coming into the program several weeks ago, he is already feeling less hopeless and less tired or bad about himself.  He also states he is seeing progress in that he is moving better, knees are stronger, breathing better and generally has more energy.  His goal for this program are to continue this progress and to return to more normal household activities.  He is a very determined individual, so I predict he will work hard and accomplish these goals, in order to be strong enough to have the knee replacement surgeries.  Counselor will continue to follow  as needed.     Continued Psychosocial Services Needed Yes  Mr. Deese will benefit from all of the psychoeducational components of this program as he needs to learn how to manage stress better and the benefit of relaxation.        Psychosocial Re-Evaluation:   Vocational Rehabilitation: Provide vocational rehab assistance to qualifying candidates.   Vocational Rehab Evaluation & Intervention:     Vocational Rehab - 02/13/15 1125    Initial Vocational Rehab Evaluation & Intervention   Assessment shows need for Vocational Rehabilitation No      Education: Education Goals: Education classes will be provided on a weekly basis, covering required topics. Participant will state understanding/return demonstration of topics presented.  Learning Barriers/Preferences:     Learning Barriers/Preferences - 02/13/15 1124    Learning Barriers/Preferences   Learning Barriers Exercise Concerns   Learning  Preferences None      Education Topics: General Nutrition Guidelines/Fats and Fiber: -Group instruction provided by verbal, written material, models and posters to present the general guidelines for heart healthy nutrition. Gives an explanation and review of dietary fats and fiber.   Controlling Sodium/Reading Food Labels: -Group verbal and written material supporting the discussion of sodium use in heart healthy nutrition. Review and explanation with models, verbal and written materials for utilization of the food label.   Exercise Physiology & Risk Factors: - Group verbal and written instruction with models to review the exercise physiology of the cardiovascular system and associated critical values. Details cardiovascular disease risk factors and the goals associated with each risk factor.   Aerobic Exercise & Resistance Training: - Gives group verbal and written discussion on the health impact of inactivity. On the components of aerobic and resistive training programs and the benefits of this training and how to safely progress through these programs.   Flexibility, Balance, General Exercise Guidelines: - Provides group verbal and written instruction on the benefits of flexibility and balance training programs. Provides general exercise guidelines with specific guidelines to those with heart or lung disease. Demonstration and skill practice provided.   Stress Management: - Provides group verbal and written instruction about the health risks of elevated stress, cause of high stress, and healthy ways to reduce stress.   Depression: - Provides group verbal and written instruction on the correlation between heart/lung disease and depressed mood, treatment options, and the stigmas associated with seeking treatment.   Anatomy & Physiology of the Heart: - Group verbal and written instruction and models provide basic cardiac anatomy and physiology, with the coronary electrical and  arterial systems. Review of: AMI, Angina, Valve disease, Heart Failure, Cardiac Arrhythmia, Pacemakers, and the ICD.   Cardiac Procedures: - Group verbal and written instruction and models to describe the testing methods done to diagnose heart disease. Reviews the outcomes of the test results. Describes the treatment choices: Medical Management, Angioplasty, or Coronary Bypass Surgery.   Cardiac Medications: - Group verbal and written instruction to review commonly prescribed medications for heart disease. Reviews the medication, class of the drug, and side effects. Includes the steps to properly store meds and maintain the prescription regimen.   Go Sex-Intimacy & Heart Disease, Get SMART - Goal Setting: - Group verbal and written instruction through game format to discuss heart disease and the return to sexual intimacy. Provides group verbal and written material to discuss and apply goal setting through the application of the S.M.A.R.T. Method.   Other Matters of the Heart: - Provides group verbal, written materials and models to  describe Heart Failure, Angina, Valve Disease, and Diabetes in the realm of heart disease. Includes description of the disease process and treatment options available to the cardiac patient.   Exercise & Equipment Safety: - Individual verbal instruction and demonstration of equipment use and safety with use of the equipment.   Infection Prevention: - Provides verbal and written material to individual with discussion of infection control including proper hand washing and proper equipment cleaning during exercise session.   Falls Prevention: - Provides verbal and written material to individual with discussion of falls prevention and safety.   Diabetes: - Individual verbal and written instruction to review signs/symptoms of diabetes, desired ranges of glucose level fasting, after meals and with exercise. Advice that pre and post exercise glucose checks will be  done for 3 sessions at entry of program.    Knowledge Questionnaire Score:     Knowledge Questionnaire Score - 01/11/15 1125    Knowledge Questionnaire Score   Pre Score 17/28      Personal Goals and Risk Factors at Admission:     Personal Goals and Risk Factors at Admission - 02/13/15 1127    Personal Goals and Risk Factors on Admission   Increase Aerobic Exercise and Physical Activity Yes   Intervention While in program, learn and follow the exercise prescription taught. Start at a low level workload and increase workload after able to maintain previous level for 30 minutes. Increase time before increasing intensity.   Understand more about Heart/Pulmonary Disease. Yes   Intervention While in program utilize professionals for any questions, and attend the education sessions. Great websites to use are www.americanheart.org or www.lung.org for reliable information.   Diabetes No   Hypertension Yes   Goal Participant will see blood pressure controlled within the values of 140/28m/Hg or within value directed by their physician.   Intervention Provide nutrition & aerobic exercise along with prescribed medications to achieve BP 140/90 or less.   Lipids Yes   Goal Cholesterol controlled with medications as prescribed, with individualized exercise RX and with personalized nutrition plan. Value goals: LDL < 722m HDL > 4081mParticipant states understanding of desired cholesterol values and following prescriptions.   Intervention Provide nutrition & aerobic exercise along with prescribed medications to achieve LDL <27m22mDL >40mg52m   Personal Goals and Risk Factors Review:      Goals and Risk Factor Review      02/14/15 0928           Increase Aerobic Exercise and Physical Activity   Goals Progress/Improvement seen  Yes       Comments Is exercising on the Recumbent bike level 5! Ken rYvone Neurts he feels stronger and feels Cardiac Rehab has helped him.        Understand more about  Heart/Pulmonary Disease   Goals Progress/Improvement seen  Yes          Personal Goals Discharge:     Comments:30 day review

## 2015-02-16 ENCOUNTER — Other Ambulatory Visit: Payer: Self-pay | Admitting: *Deleted

## 2015-02-16 DIAGNOSIS — Z9861 Coronary angioplasty status: Secondary | ICD-10-CM

## 2015-02-16 DIAGNOSIS — R55 Syncope and collapse: Secondary | ICD-10-CM | POA: Diagnosis not present

## 2015-02-16 DIAGNOSIS — I6529 Occlusion and stenosis of unspecified carotid artery: Secondary | ICD-10-CM | POA: Diagnosis not present

## 2015-02-16 DIAGNOSIS — I472 Ventricular tachycardia: Secondary | ICD-10-CM | POA: Diagnosis not present

## 2015-02-16 DIAGNOSIS — I6521 Occlusion and stenosis of right carotid artery: Secondary | ICD-10-CM | POA: Diagnosis not present

## 2015-02-16 NOTE — Progress Notes (Signed)
Daily Session Note  Patient Details  Name: SOHAIB VEREEN MRN: [id number] Date of Birth: [birth year] Referring Provider:  Einar Pheasant, MD  Encounter Date: 02/16/2015  Check In:     Session Check In - 02/16/15 0837    Check-In   Staff Present Heath Lark RN, BSN, CCRP;Myrka Sylva BS, ACSM EP-C, Exercise Physiologist;Renee Dillard Essex MS, ACSM CEP Exercise Physiologist   ER physicians immediately available to respond to emergencies See telemetry face sheet for immediately available ER MD   Medication changes reported     No   Fall or balance concerns reported    No   Warm-up and Cool-down Performed on first and last piece of equipment   VAD Patient? No   Pain Assessment   Currently in Pain? No/denies         Goals Met:  Proper associated with RPD/PD & O2 Sat Exercise tolerated well No report of cardiac concerns or symptoms Strength training completed today  Goals Unmet:  Not Applicable  Goals Comments:    Dr. Emily Filbert is Medical Director for [place] and [facility].

## 2015-02-18 ENCOUNTER — Encounter: Payer: Medicare Other | Admitting: *Deleted

## 2015-02-18 DIAGNOSIS — Z9861 Coronary angioplasty status: Secondary | ICD-10-CM | POA: Diagnosis not present

## 2015-02-18 NOTE — Telephone Encounter (Signed)
Informed that I had completed the form with the information available.  May need to call cardiology for more information.  Will review form.

## 2015-02-18 NOTE — Progress Notes (Signed)
Daily Session Note  Patient Details  Name: BENSEN CHADDERDON MRN: [id number] Date of Birth: June 21, 1941 Referring Provider:  Einar Pheasant, MD  Encounter Date: 02/18/2015  Check In:     Session Check In - 02/18/15 0904    Check-In   Staff Present Heath Lark RN, BSN, CCRP;Renee Dillard Essex MS, ACSM CEP Exercise Physiologist;Carroll Enterkin RN, BSN   ER physicians immediately available to respond to emergencies See telemetry face sheet for immediately available ER MD   Medication changes reported     No   Fall or balance concerns reported    No   Warm-up and Cool-down Performed on first and last piece of equipment   VAD Patient? No   Pain Assessment   Currently in Pain? No/denies         Goals Met:  Independence with exercise equipment Exercise tolerated well No report of cardiac concerns or symptoms Strength training completed today  Goals Unmet:  Not Applicable  Goals Comments:    Dr. Emily Filbert is Medical Director for [place] and [facility].

## 2015-02-22 NOTE — Telephone Encounter (Signed)
Paperwork faxed back to [place] with note regarding possible need to have Cardiology or Cardiac Rehab to complete the remainder of forms.

## 2015-02-23 ENCOUNTER — Encounter: Payer: Medicare Other | Attending: Internal Medicine

## 2015-02-23 DIAGNOSIS — Z9861 Coronary angioplasty status: Secondary | ICD-10-CM | POA: Insufficient documentation

## 2015-02-25 ENCOUNTER — Encounter: Payer: Medicare Other | Admitting: *Deleted

## 2015-02-25 DIAGNOSIS — Z9861 Coronary angioplasty status: Secondary | ICD-10-CM

## 2015-02-25 NOTE — Progress Notes (Signed)
Daily Session Note  Patient Details  Name: FAUST THORINGTON MRN: [id number] Date of Birth: 03-07-1941 Referring Provider:  Einar Pheasant, MD  Encounter Date: 02/25/2015  Check In:     Session Check In - 02/25/15 0916    Check-In   Staff Present Heath Lark RN, BSN, CCRP;Renee Dillard Essex MS, ACSM CEP Exercise Physiologist;Carroll Enterkin RN, BSN   ER physicians immediately available to respond to emergencies See telemetry face sheet for immediately available ER MD   Medication changes reported     No   Fall or balance concerns reported    No   Warm-up and Cool-down Performed on first and last piece of equipment   VAD Patient? No   Pain Assessment   Currently in Pain? No/denies   Multiple Pain Sites No         Goals Met:  Independence with exercise equipment Exercise tolerated well No report of cardiac concerns or symptoms Strength training completed today  Goals Unmet:  Not Applicable  Goals Comments:    Dr. Emily Filbert is Medical Director for [place] and [facility].

## 2015-02-28 ENCOUNTER — Encounter: Payer: Medicare Other | Admitting: *Deleted

## 2015-02-28 DIAGNOSIS — Z9861 Coronary angioplasty status: Secondary | ICD-10-CM | POA: Diagnosis not present

## 2015-02-28 NOTE — Progress Notes (Signed)
Daily Session Note  Patient Details  Name: JUSTINE COSSIN MRN: [id number] Date of Birth: 25-Jul-1941 Referring Provider:  Einar Pheasant, MD  Encounter Date: 02/28/2015  Check In:     Session Check In - 02/28/15 0836    Check-In   Staff Present Candiss Norse MS, ACSM CEP Exercise Physiologist;Susanne Bice RN, BSN, CCRP;Kaycee Mcgaugh Alfonso Patten, ACSM CEP Exercise Physiologist   ER physicians immediately available to respond to emergencies [facility] immediately available ER MD   Medication changes reported     No   Fall or balance concerns reported    No   Warm-up and Cool-down Performed on first and last piece of equipment   VAD Patient? No   Pain Assessment   Currently in Pain? No/denies   Multiple Pain Sites No         Goals Met:  Independence with exercise equipment Exercise tolerated well No report of cardiac concerns or symptoms  Goals Unmet:  Not Applicable  Goals Comments:    Dr. Emily Filbert is Medical Director for [place] and [facility].

## 2015-03-02 DIAGNOSIS — Z9861 Coronary angioplasty status: Secondary | ICD-10-CM

## 2015-03-02 NOTE — Progress Notes (Signed)
Daily Session Note  Patient Details  Name: Daniel Austin MRN: [id number] Date of Birth: Jul 30, 1941 Referring Provider:  Einar Pheasant, MD  Encounter Date: 03/02/2015  Check In:     Session Check In - 03/02/15 0857    Check-In   Staff Present Heath Lark RN, BSN, CCRP;Rease Wence BS, ACSM EP-C, Exercise Physiologist;Renee Dillard Essex MS, ACSM CEP Exercise Physiologist   ER physicians immediately available to respond to emergencies See telemetry face sheet for immediately available ER MD   Medication changes reported     No   Fall or balance concerns reported    No   Warm-up and Cool-down Performed on first and last piece of equipment   VAD Patient? No   Pain Assessment   Currently in Pain? No/denies           Exercise Prescription Changes - 03/02/15 0800    Exercise Review   Progression Yes   Recumbant Bike   Level 7   RPM 65      Goals Met:  Proper associated with RPD/PD & O2 Sat Exercise tolerated well No report of cardiac concerns or symptoms Strength training completed today  Goals Unmet:  Not Applicable  Goals Comments:    Dr. Emily Filbert is Medical Director for [place] and [facility].

## 2015-03-04 ENCOUNTER — Encounter: Payer: Medicare Other | Admitting: *Deleted

## 2015-03-04 DIAGNOSIS — Z9861 Coronary angioplasty status: Secondary | ICD-10-CM | POA: Diagnosis not present

## 2015-03-04 NOTE — Progress Notes (Signed)
Daily Session Note  Patient Details  Name: ERINN HUSKINS MRN: [id number] Date of Birth: October 31, 1940 Referring Provider:  Einar Pheasant, MD  Encounter Date: 03/04/2015  Check In:     Session Check In - 03/04/15 0913    Check-In   Staff Present Nyoka Cowden RN;Jenina Moening RN, Drusilla Kanner MS, ACSM CEP Exercise Physiologist   ER physicians immediately available to respond to emergencies See telemetry face sheet for immediately available ER MD   Medication changes reported     No   Fall or balance concerns reported    No   Warm-up and Cool-down Performed on first and last piece of equipment   Pain Assessment   Currently in Pain? No/denies         Goals Met:  Proper associated with RPD/PD & O2 Sat Personal goals reviewed  Goals Unmet:  Not Applicable  Goals Comments:    Dr. Emily Filbert is Medical Director for [place] and [facility].

## 2015-03-07 ENCOUNTER — Encounter: Payer: Medicare Other | Admitting: *Deleted

## 2015-03-07 DIAGNOSIS — Z9861 Coronary angioplasty status: Secondary | ICD-10-CM | POA: Diagnosis not present

## 2015-03-07 NOTE — Progress Notes (Signed)
Daily Session Note  Patient Details  Name: Daniel Austin MRN: [id number] Date of Birth: 1940-12-21 Referring Provider:  Einar Pheasant, MD  Encounter Date: 03/07/2015  Check In:     Session Check In - 03/07/15 0831    Check-In   Staff Present Candiss Norse MS, ACSM CEP Exercise Physiologist;Makensie Mulhall Alfonso Patten, ACSM CEP Exercise Physiologist;Susanne Bice RN, BSN, [place]   ER physicians immediately available to respond to emergencies See telemetry face sheet for immediately available ER MD   Medication changes reported     No   Fall or balance concerns reported    No   Warm-up and Cool-down Performed on first and last piece of equipment   VAD Patient? No   Pain Assessment   Currently in Pain? No/denies   Multiple Pain Sites No         Goals Met:  Independence with exercise equipment Exercise tolerated well No report of cardiac concerns or symptoms  Goals Unmet:  Not Applicable  Goals Comments:    Dr. Emily Filbert is Medical Director for [place] and [facility].

## 2015-03-09 ENCOUNTER — Encounter: Payer: Self-pay | Admitting: Internal Medicine

## 2015-03-09 ENCOUNTER — Encounter: Payer: Medicare Other | Admitting: *Deleted

## 2015-03-09 ENCOUNTER — Ambulatory Visit (INDEPENDENT_AMBULATORY_CARE_PROVIDER_SITE_OTHER): Payer: Medicare Other | Admitting: Internal Medicine

## 2015-03-09 VITALS — BP 122/80 | HR 61 | Temp 98.4°F | Ht 68.0 in | Wt 197.0 lb

## 2015-03-09 DIAGNOSIS — I6529 Occlusion and stenosis of unspecified carotid artery: Secondary | ICD-10-CM

## 2015-03-09 DIAGNOSIS — Z9861 Coronary angioplasty status: Secondary | ICD-10-CM | POA: Diagnosis not present

## 2015-03-09 DIAGNOSIS — D649 Anemia, unspecified: Secondary | ICD-10-CM

## 2015-03-09 DIAGNOSIS — E039 Hypothyroidism, unspecified: Secondary | ICD-10-CM

## 2015-03-09 DIAGNOSIS — R739 Hyperglycemia, unspecified: Secondary | ICD-10-CM

## 2015-03-09 DIAGNOSIS — I10 Essential (primary) hypertension: Secondary | ICD-10-CM

## 2015-03-09 DIAGNOSIS — E7800 Pure hypercholesterolemia, unspecified: Secondary | ICD-10-CM

## 2015-03-09 DIAGNOSIS — R200 Anesthesia of skin: Secondary | ICD-10-CM

## 2015-03-09 DIAGNOSIS — I2510 Atherosclerotic heart disease of native coronary artery without angina pectoris: Secondary | ICD-10-CM

## 2015-03-09 DIAGNOSIS — R208 Other disturbances of skin sensation: Secondary | ICD-10-CM

## 2015-03-09 DIAGNOSIS — M25562 Pain in left knee: Secondary | ICD-10-CM

## 2015-03-09 DIAGNOSIS — M25561 Pain in right knee: Secondary | ICD-10-CM

## 2015-03-09 MED ORDER — LORAZEPAM 1 MG PO TABS: ORAL_TABLET | ORAL | Status: DC

## 2015-03-09 NOTE — Progress Notes (Signed)
Pre visit review using our clinic review tool, if applicable. No additional management support is needed unless otherwise documented below in the visit note. 

## 2015-03-09 NOTE — Progress Notes (Signed)
Daily Session Note  Patient Details  Name: Daniel Austin MRN: [id number] Date of Birth: July 27, 1941 Referring Provider:  Einar Pheasant, MD  Encounter Date: 03/09/2015  Check In:     Session Check In - 03/09/15 0832    Check-In   Staff Present Heath Lark RN, BSN, CCRP;Steven Way BS, ACSM EP-C, Exercise Physiologist;Gianno Volner Dillard Essex MS, ACSM CEP Exercise Physiologist   ER physicians immediately available to respond to emergencies See telemetry face sheet for immediately available ER MD   Medication changes reported     No   Fall or balance concerns reported    No   Warm-up and Cool-down Performed on first and last piece of equipment   VAD Patient? No   Pain Assessment   Currently in Pain? No/denies   Multiple Pain Sites No           Exercise Prescription Changes - 03/09/15 0800    Exercise Review   Progression Yes   Response to Exercise   Blood Pressure (Admit) 140/84 mmHg   Blood Pressure (Exercise) 126/72 mmHg   Blood Pressure (Exit) 114/80 mmHg   Heart Rate (Admit) 77 bpm   Heart Rate (Exercise) 83 bpm   Heart Rate (Exit) 71 bpm   Rating of Perceived Exertion (Exercise) 12   Frequency Add 1 additional day to program exercise sessions.   Duration Progress to 50 minutes of aerobic without signs/symptoms of physical distress   Intensity THRR unchanged   Progression Continue progressive overload as per policy without signs/symptoms or physical distress.   Resistance Training   Training Prescription Yes   Weight 3   Reps 10-12   Interval Training   Interval Training Yes   Equipment NuStep;Recumbant Bike   Recumbant Bike   Level 6   RPM 70   Minutes 25   NuStep   Level 8   Watts 115   Minutes 20      Goals Met:  Independence with exercise equipment Exercise tolerated well Personal goals reviewed No report of cardiac concerns or symptoms Strength training completed today  Goals Unmet:  Not Applicable  Goals Comments:   Dr. Emily Filbert is Medical  Director for [facility].

## 2015-03-11 ENCOUNTER — Encounter: Payer: Medicare Other | Admitting: *Deleted

## 2015-03-11 DIAGNOSIS — Z9861 Coronary angioplasty status: Secondary | ICD-10-CM | POA: Diagnosis not present

## 2015-03-11 NOTE — Progress Notes (Signed)
Discharge Summary  Patient Details  Name: Daniel Austin MRN: [id number] Date of Birth: [birth year] Referring Provider:  Einar Pheasant, MD   Number of Visits: 81  Reason for Discharge:  Patient reached a stable level of exercise. Patient independent in their exercise.  Smoking History:  History  Smoking status  . Never Smoker   Smokeless tobacco  . Never Used    Diagnosis:  Post PTCA  ADL UCSD:   Initial Exercise Prescription:   Discharge Exercise Prescription:     Discharge Prescription - 02/09/15 0900    Exercise Review   Progression Yes   Home Exercise Plan   Plans to continue exercise at Home      Functional Capacity:     6 Minute Walk      01/11/15 1125       6 Minute Walk   Phase Initial     Distance 418 feet     Walk Time 6 minutes     Resting HR 86 bpm     Resting BP 148/94 mmHg     Max Ex. HR 86 bpm     Max Ex. BP 150/100 mmHg     RPE 9     Symptoms Yes (comment)     Comments NO symptoms   used NUSTEP        Psychological, QOL, Others - Outcomes: PHQ 2/9: Depression screen [facility] 2/9 03/11/2015 01/07/2015 01/04/2014 09/10/2013 09/10/2013  Decreased Interest 0 3 0 0 0  Down, Depressed, Hopeless 0 1 0 1 2  PHQ - 2 Score 0 4 0 1 2  Altered sleeping 0 3 - 0 -  Tired, decreased energy 0 3 - 2 -  Change in appetite 0 2 - 0 -  Feeling bad or failure about yourself  0 3 - 0 -  Trouble concentrating 0 1 - 0 -  Moving slowly or fidgety/restless 0 0 - 0 -  Suicidal thoughts 0 0 - 0 -  PHQ-9 Score 0 16 - 3 -  Difficult doing work/chores Not difficult at all Somewhat difficult - - -    Quality of Life:     Quality of Life - 03/11/15 1001    Quality of Life Scores   Health/Function Post 25 %   Socioeconomic Post 29.69 %   Psych/Spiritual Post 29.64 %   Family Post 30 %   GLOBAL Post 27.71 %      Personal Goals: Goals established at orientation with interventions provided to work toward goal.     Personal Goals and Risk Factors at  Admission - 02/13/15 1127    Personal Goals and Risk Factors on Admission   Increase Aerobic Exercise and Physical Activity Yes   Intervention While in program, learn and follow the exercise prescription taught. Start at a low level workload and increase workload after able to maintain previous level for 30 minutes. Increase time before increasing intensity.   Understand more about Heart/Pulmonary Disease. Yes   Intervention While in program utilize professionals for any questions, and attend the education sessions. Great websites to use are www.americanheart.org or www.lung.org for reliable information.   Diabetes No   Hypertension Yes   Goal Participant will see blood pressure controlled within the values of 140/68mm/Hg or within value directed by their physician.   Intervention Provide nutrition & aerobic exercise along with prescribed medications to achieve BP 140/90 or less.   Lipids Yes   Goal Cholesterol controlled with medications as prescribed, with individualized exercise RX and  with personalized nutrition plan. Value goals: LDL < 70mg , HDL > 40mg . Participant states understanding of desired cholesterol values and following prescriptions.   Intervention Provide nutrition & aerobic exercise along with prescribed medications to achieve LDL 70mg , HDL >40mg .       Personal Goals Discharge:   Nutrition & Weight - Outcomes:     Pre Biometrics - 01/11/15 1127    Pre Biometrics   Height 5\' 8"  (1.727 m)   Weight 198 lb 14.4 oz (90.22 kg)   Waist Circumference 41 inches   Hip Circumference 43 inches   Waist to Hip Ratio 0.95 %   BMI (Calculated) 30.3       Nutrition:   Nutrition Discharge:     Rate Your Plate - 92/01/00 7121    Rate Your Plate Scores   Post Score 69   Post Score % 74 %      Education Questionnaire Score:     Knowledge Questionnaire Score - 03/11/15 1002    Knowledge Questionnaire Score   Post Score 27      Goals reviewed with patient; copy  given to patient.

## 2015-03-11 NOTE — Progress Notes (Signed)
Cardiac Individual Treatment Plan  Patient Details  Name: Daniel Austin MRN: [id number] Date of Birth: June 27, 1941 Referring Provider:  Einar Pheasant, MD  Initial Encounter Date:    Visit Diagnosis: Post PTCA  Patient's Home Medications on Admission:  Current outpatient prescriptions:  .  Aspirin 81 MG EC tablet, Take 81 mg by mouth daily., Disp: , Rfl:  .  citalopram (CELEXA) 20 MG tablet, TAKE 1 TABLET (20 MG TOTAL) BY MOUTH DAILY., Disp: 90 tablet, Rfl: 0 .  clopidogrel (PLAVIX) 75 MG tablet, TAKE 1 TABLET (75 MG TOTAL) BY MOUTH DAILY., Disp: 30 tablet, Rfl: 5 .  CRESTOR 20 MG tablet, Take 20 mg by mouth daily., Disp: , Rfl: 11 .  fexofenadine (ALLEGRA) 180 MG tablet, Take 180 mg by mouth daily., Disp: , Rfl:  .  levothyroxine (SYNTHROID, LEVOTHROID) 112 MCG tablet, TAKE 1 TABLET (112 MCG TOTAL) BY MOUTH DAILY., Disp: 30 tablet, Rfl: 5 .  lisinopril (PRINIVIL,ZESTRIL) 40 MG tablet, TAKE 1 TABLET (40 MG TOTAL) BY MOUTH DAILY., Disp: 30 tablet, Rfl: 5 .  LORazepam (ATIVAN) 1 MG tablet, TAKE 1 TABLET BY MOUTH AT BEDTIME AS NEEDED FOR ANXIETY, Disp: 30 tablet, Rfl: 1 .  metoprolol tartrate (LOPRESSOR) 25 MG tablet, Take 1 tablet (25 mg total) by mouth 2 (two) times daily., Disp: 180 tablet, Rfl: 3 .  naproxen sodium (ANAPROX) 220 MG tablet, Take 220 mg by mouth as needed (knee pain)., Disp: , Rfl:  .  nitroGLYCERIN (NITROSTAT) 0.4 MG SL tablet, Place 0.4 mg under the tongue every 5 (five) minutes as needed. If a third tablet is needed please call 911., Disp: , Rfl:  .  pantoprazole (PROTONIX) 40 MG tablet, TAKE 1 TABLET (40 MG TOTAL) BY MOUTH DAILY., Disp: 30 tablet, Rfl: 5  Past Medical History: Past Medical History  Diagnosis Date  . Hypercholesterolemia   . Hypothyroidism   . Hypertension   . Fractures, multiple     Metal rod in left foot  . CAD (coronary artery disease)   . Carotid artery stenosis     high-grade symptomatic right sided s/p stent twice  . Syncope    second degree heart block s/p [employer]'s pacemaker placement 12/08  . Anemia   . Ulcer of gastric fundus   . Hyperglycemia     Tobacco Use: History  Smoking status  . Never Smoker   Smokeless tobacco  . Never Used    Labs: Recent Review Flowsheet Data    Labs for ITP Cardiac and Pulmonary Rehab Latest Ref Rng 09/02/2013 12/31/2013 05/25/2014 09/28/2014 01/07/2015   Cholestrol 0 - 200 mg/dL 159 154 141 159 130   LDLCALC 0 - 99 mg/dL 91 96 80 87 63   HDL >39.00 mg/dL 49.60 43.40 44.40 44.90 44.40   Trlycerides 0.0 - 149.0 mg/dL 93.0 71.0 81.0 134.0 115.0   Hemoglobin A1c 4.6 - 6.5 % 6.0 6.1 6.0 6.0 5.9       Exercise Target Goals:    Exercise Program Goal: Individual exercise prescription set with THRR, safety & activity barriers. Participant demonstrates ability to understand and report RPE using BORG scale, to self-measure pulse accurately, and to acknowledge the importance of the exercise prescription.  Exercise Prescription Goal: Starting with aerobic activity 30 plus minutes a day, 3 days per week for initial exercise prescription. Provide home exercise prescription and guidelines that participant acknowledges understanding prior to discharge.  Activity Barriers & Risk Stratification:     Activity Barriers & Risk Stratification - 02/13/15 1124  Activity Barriers & Risk Stratification   Activity Barriers Arthritis;Joint Problems;Assistive Device;Other (comment)   Comments 2 canes and knee braces for arthritis in both knees   Risk Stratification High      6 Minute Walk:     6 Minute Walk      01/11/15 1125       6 Minute Walk   Phase Initial     Distance 418 feet     Walk Time 6 minutes     Resting HR 86 bpm     Resting BP 148/94 mmHg     Max Ex. HR 86 bpm     Max Ex. BP 150/100 mmHg     RPE 9     Symptoms Yes (comment)     Comments NO symptoms   used NUSTEP        Initial Exercise Prescription:   Exercise Prescription Changes:     Exercise  Prescription Changes      02/02/15 0800 02/09/15 0800 02/15/15 0700 03/02/15 0800 03/09/15 0800   Exercise Review   Progression Yes Yes Yes Yes Yes   Response to Exercise   Blood Pressure (Admit)   140/84 mmHg  140/84 mmHg   Blood Pressure (Exercise)   126/72 mmHg  126/72 mmHg   Blood Pressure (Exit)   114/80 mmHg  114/80 mmHg   Heart Rate (Admit)   77 bpm  77 bpm   Heart Rate (Exercise)   83 bpm  83 bpm   Heart Rate (Exit)   71 bpm  71 bpm   Rating of Perceived Exertion (Exercise)   12  12   Frequency   Add 1 additional day to program exercise sessions.  Add 1 additional day to program exercise sessions.   Duration   Progress to 50 minutes of aerobic without signs/symptoms of physical distress  Progress to 50 minutes of aerobic without signs/symptoms of physical distress   Intensity   THRR unchanged  THRR unchanged   Progression   Continue progressive overload as per policy without signs/symptoms or physical distress.  Continue progressive overload as per policy without signs/symptoms or physical distress.   Resistance Training   Training Prescription   Yes  Yes   Weight   3  3   Reps   10-12  10-12   Interval Training   Interval Training   Yes  Yes   Equipment   NuStep;Recumbant Bike  NuStep;Recumbant Bike   Recumbant Bike   Level '6  6 7 6   ' RPM 70  70 65 70   Minutes   25  25   NuStep   Level '5  6  8   ' ESPQZ 30  07  622   Minutes   20  20      Discharge Exercise Prescription:     Discharge Prescription - 02/09/15 0900    Exercise Review   Progression Yes   Home Exercise Plan   Plans to continue exercise at Home      Nutrition:  Target Goals: Understanding of nutrition guidelines, daily intake of sodium <1566m, cholesterol <2051m calories 30% from fat and 7% or less from saturated fats, daily to have 5 or more servings of fruits and vegetables.  Biometrics:     Pre Biometrics - 01/11/15 1127    Pre Biometrics   Height '5\' 8"'  (1.727 m)   Weight 198 lb 14.4 oz  (90.22 kg)   Waist Circumference 41 inches   Hip Circumference 43 inches  Waist to Hip Ratio 0.95 %   BMI (Calculated) 30.3       Nutrition Therapy Plan and Nutrition Goals:   Nutrition Discharge: Rate Your Plate Scores:   Nutrition Goals Re-Evaluation:     Nutrition Goals Re-Evaluation      02/14/15 0926           Personal Goal #1 Re-Evaluation   Personal Goal #1 [name] doesn't feel he needs to meet with the dietician. [name] reports his wife cooks healthy. He said he cut out salt 10 years ago and uses pepper instead.        Goal Progress Seen Yes          Psychosocial: Target Goals: Acknowledge presence or absence of depression, maximize coping skills, provide positive support system. Participant is able to verbalize types and ability to use techniques and skills needed for reducing stress and depression.  Initial Review & Psychosocial Screening:   Quality of Life Scores:     Quality of Life - 01/11/15 1128    Quality of Life Scores   Health/Function Pre 15.93 %   Socioeconomic Pre 25.63 %   Psych/Spiritual Pre 20.43 %   Family Pre 23.9 %   GLOBAL Pre 20.19 %      PHQ-9:     Recent Review Flowsheet Data    Depression screen [facility] 2/9 01/07/2015 01/04/2014 09/10/2013 09/10/2013   Decreased Interest 3 0 0 0   Down, Depressed, Hopeless 1 0 1 2   PHQ - 2 Score 4 0 1 2   Altered sleeping 3 - 0 -   Tired, decreased energy 3 - 2 -   Change in appetite 2 - 0 -   Feeling bad or failure about yourself  3 - 0 -   Trouble concentrating 1 - 0 -   Moving slowly or fidgety/restless 0 - 0 -   Suicidal thoughts 0 - 0 -   PHQ-9 Score 16 - 3 -   Difficult doing work/chores Somewhat difficult - - -      Psychosocial Evaluation and Intervention:     Psychosocial Evaluation - 02/07/15 0959    Psychosocial Evaluation & Interventions   Interventions Stress management education;Relaxation education;Encouraged to exercise with the program and follow exercise prescription    Comments Counselor  met today with Mr. Buffalo for initial psychosocial evaluation.  He is a 74 year old hard-working gentleman who has had heart problems since 2008.  He has a strong support system with a spouse of 36 years, an adult daughter who lives close by and is actively involved in his faith community.  Mr. Edgecombe was walking with (2) knee braces and the use of (2) canes and reports needing "knee Replacement" when he completes this program.  He states he has a history of depression and anxiety symptoms and has been on medications since 2008, which he reports work well for him currently with no current symptoms.  His PHQ-9 was "65" and counselor reviewed that with Mr. Baze reporting since coming into the program several weeks ago, he is already feeling less hopeless and less tired or bad about himself.  He also states he is seeing progress in that he is moving better, knees are stronger, breathing better and generally has more energy.  His goal for this program are to continue this progress and to return to more normal household activities.  He is a very determined individual, so I predict he will work hard and accomplish these goals, in order  to be strong enough to have the knee replacement surgeries.  Counselor will continue to follow as needed.     Continued Psychosocial Services Needed Yes  Mr. Towson will benefit from all of the psychoeducational components of this program as he needs to learn how to manage stress better and the benefit of relaxation.        Psychosocial Re-Evaluation:   Vocational Rehabilitation: Provide vocational rehab assistance to qualifying candidates.   Vocational Rehab Evaluation & Intervention:     Vocational Rehab - 02/13/15 1125    Initial Vocational Rehab Evaluation & Intervention   Assessment shows need for Vocational Rehabilitation No      Education: Education Goals: Education classes will be provided on a weekly basis, covering required topics.  Participant will state understanding/return demonstration of topics presented.  Learning Barriers/Preferences:     Learning Barriers/Preferences - 02/13/15 1124    Learning Barriers/Preferences   Learning Barriers Exercise Concerns   Learning Preferences None      Education Topics: General Nutrition Guidelines/Fats and Fiber: -Group instruction provided by verbal, written material, models and posters to present the general guidelines for heart healthy nutrition. Gives an explanation and review of dietary fats and fiber.   Controlling Sodium/Reading Food Labels: -Group verbal and written material supporting the discussion of sodium use in heart healthy nutrition. Review and explanation with models, verbal and written materials for utilization of the food label.   Exercise Physiology & Risk Factors: - Group verbal and written instruction with models to review the exercise physiology of the cardiovascular system and associated critical values. Details cardiovascular disease risk factors and the goals associated with each risk factor.   Aerobic Exercise & Resistance Training: - Gives group verbal and written discussion on the health impact of inactivity. On the components of aerobic and resistive training programs and the benefits of this training and how to safely progress through these programs.   Flexibility, Balance, General Exercise Guidelines: - Provides group verbal and written instruction on the benefits of flexibility and balance training programs. Provides general exercise guidelines with specific guidelines to those with heart or lung disease. Demonstration and skill practice provided.   Stress Management: - Provides group verbal and written instruction about the health risks of elevated stress, cause of high stress, and healthy ways to reduce stress.   Depression: - Provides group verbal and written instruction on the correlation between heart/lung disease and depressed  mood, treatment options, and the stigmas associated with seeking treatment.   Anatomy & Physiology of the Heart: - Group verbal and written instruction and models provide basic cardiac anatomy and physiology, with the coronary electrical and arterial systems. Review of: AMI, Angina, Valve disease, Heart Failure, Cardiac Arrhythmia, Pacemakers, and the ICD.   Cardiac Procedures: - Group verbal and written instruction and models to describe the testing methods done to diagnose heart disease. Reviews the outcomes of the test results. Describes the treatment choices: Medical Management, Angioplasty, or Coronary Bypass Surgery.   Cardiac Medications: - Group verbal and written instruction to review commonly prescribed medications for heart disease. Reviews the medication, class of the drug, and side effects. Includes the steps to properly store meds and maintain the prescription regimen.   Go Sex-Intimacy & Heart Disease, Get SMART - Goal Setting: - Group verbal and written instruction through game format to discuss heart disease and the return to sexual intimacy. Provides group verbal and written material to discuss and apply goal setting through the application of the S.M.A.R.T. Method.  Other Matters of the Heart: - Provides group verbal, written materials and models to describe Heart Failure, Angina, Valve Disease, and Diabetes in the realm of heart disease. Includes description of the disease process and treatment options available to the cardiac patient.   Exercise & Equipment Safety: - Individual verbal instruction and demonstration of equipment use and safety with use of the equipment.   Infection Prevention: - Provides verbal and written material to individual with discussion of infection control including proper hand washing and proper equipment cleaning during exercise session.   Falls Prevention: - Provides verbal and written material to individual with discussion of falls  prevention and safety.   Diabetes: - Individual verbal and written instruction to review signs/symptoms of diabetes, desired ranges of glucose level fasting, after meals and with exercise. Advice that pre and post exercise glucose checks will be done for 3 sessions at entry of program.    Knowledge Questionnaire Score:     Knowledge Questionnaire Score - 01/11/15 1125    Knowledge Questionnaire Score   Pre Score 17/28      Personal Goals and Risk Factors at Admission:     Personal Goals and Risk Factors at Admission - 02/13/15 1127    Personal Goals and Risk Factors on Admission   Increase Aerobic Exercise and Physical Activity Yes   Intervention While in program, learn and follow the exercise prescription taught. Start at a low level workload and increase workload after able to maintain previous level for 30 minutes. Increase time before increasing intensity.   Understand more about Heart/Pulmonary Disease. Yes   Intervention While in program utilize professionals for any questions, and attend the education sessions. Great websites to use are www.americanheart.org or www.lung.org for reliable information.   Diabetes No   Hypertension Yes   Goal Participant will see blood pressure controlled within the values of 140/88m/Hg or within value directed by their physician.   Intervention Provide nutrition & aerobic exercise along with prescribed medications to achieve BP 140/90 or less.   Lipids Yes   Goal Cholesterol controlled with medications as prescribed, with individualized exercise RX and with personalized nutrition plan. Value goals: LDL < 734m HDL > 402mParticipant states understanding of desired cholesterol values and following prescriptions.   Intervention Provide nutrition & aerobic exercise along with prescribed medications to achieve LDL <33m50mDL >40mg12m   Personal Goals and Risk Factors Review:      Goals and Risk Factor Review      02/14/15 0928 02/25/15 1250          Increase Aerobic Exercise and Physical Activity   Goals Progress/Improvement seen  Yes Yes      Comments Is exercising on the Recumbent bike level 5! Ken rYvone Neurts he feels stronger and feels Cardiac Rehab has helped him.  Improving in level and watts on the NS and RB, really enjoying the benefits of exercise and the stamina he has. He feels stronger and more comfortable and stable walking in public than before rehab      Understand more about Heart/Pulmonary Disease   Goals Progress/Improvement seen  Yes       Other Goal   Goals Progress/Improvement seen   Yes      Comments  His stability with walking is improving. He has to rely less on his canes and can move more freely and confidently than before rehab. This is the biggest difference he has noticed.          Personal  Goals Discharge:     Comments:

## 2015-03-11 NOTE — Progress Notes (Signed)
Cardiac Individual Treatment Plan  Patient Details  Name: Daniel Austin MRN: [id number] Date of Birth: 1941-08-21 Referring Provider:  Einar Pheasant, MD  Initial Encounter Date:  01/11/2015  Visit Diagnosis: Post PTCA  Patient's Home Medications on Admission:  Current outpatient prescriptions:  .  Aspirin 81 MG EC tablet, Take 81 mg by mouth daily., Disp: , Rfl:  .  citalopram (CELEXA) 20 MG tablet, TAKE 1 TABLET (20 MG TOTAL) BY MOUTH DAILY., Disp: 90 tablet, Rfl: 0 .  clopidogrel (PLAVIX) 75 MG tablet, TAKE 1 TABLET (75 MG TOTAL) BY MOUTH DAILY., Disp: 30 tablet, Rfl: 5 .  CRESTOR 20 MG tablet, Take 20 mg by mouth daily., Disp: , Rfl: 11 .  fexofenadine (ALLEGRA) 180 MG tablet, Take 180 mg by mouth daily., Disp: , Rfl:  .  levothyroxine (SYNTHROID, LEVOTHROID) 112 MCG tablet, TAKE 1 TABLET (112 MCG TOTAL) BY MOUTH DAILY., Disp: 30 tablet, Rfl: 5 .  lisinopril (PRINIVIL,ZESTRIL) 40 MG tablet, TAKE 1 TABLET (40 MG TOTAL) BY MOUTH DAILY., Disp: 30 tablet, Rfl: 5 .  LORazepam (ATIVAN) 1 MG tablet, TAKE 1 TABLET BY MOUTH AT BEDTIME AS NEEDED FOR ANXIETY, Disp: 30 tablet, Rfl: 1 .  metoprolol tartrate (LOPRESSOR) 25 MG tablet, Take 1 tablet (25 mg total) by mouth 2 (two) times daily., Disp: 180 tablet, Rfl: 3 .  naproxen sodium (ANAPROX) 220 MG tablet, Take 220 mg by mouth as needed (knee pain)., Disp: , Rfl:  .  nitroGLYCERIN (NITROSTAT) 0.4 MG SL tablet, Place 0.4 mg under the tongue every 5 (five) minutes as needed. If a third tablet is needed please call 911., Disp: , Rfl:  .  pantoprazole (PROTONIX) 40 MG tablet, TAKE 1 TABLET (40 MG TOTAL) BY MOUTH DAILY., Disp: 30 tablet, Rfl: 5  Past Medical History: Past Medical History  Diagnosis Date  . Hypercholesterolemia   . Hypothyroidism   . Hypertension   . Fractures, multiple     Metal rod in left foot  . CAD (coronary artery disease)   . Carotid artery stenosis     high-grade symptomatic right sided s/p stent twice  . Syncope      second degree heart block s/p [employer] pacemaker placement 12/08  . Anemia   . Ulcer of gastric fundus   . Hyperglycemia     Tobacco Use: History  Smoking status  . Never Smoker   Smokeless tobacco  . Never Used    Labs: Recent Review Flowsheet Data    Labs for ITP Cardiac and Pulmonary Rehab Latest Ref Rng 09/02/2013 12/31/2013 05/25/2014 09/28/2014 01/07/2015   Cholestrol 0 - 200 mg/dL 159 154 141 159 130   LDLCALC 0 - 99 mg/dL 91 96 80 87 63   HDL >39.00 mg/dL 49.60 43.40 44.40 44.90 44.40   Trlycerides 0.0 - 149.0 mg/dL 93.0 71.0 81.0 134.0 115.0   Hemoglobin A1c 4.6 - 6.5 % 6.0 6.1 6.0 6.0 5.9       Exercise Target Goals:    Exercise Program Goal: Individual exercise prescription set with THRR, safety & activity barriers. Participant demonstrates ability to understand and report RPE using BORG scale, to self-measure pulse accurately, and to acknowledge the importance of the exercise prescription.  Exercise Prescription Goal: Starting with aerobic activity 30 plus minutes a day, 3 days per week for initial exercise prescription. Provide home exercise prescription and guidelines that participant acknowledges understanding prior to discharge.  Activity Barriers & Risk Stratification:     Activity Barriers & Risk Stratification -  02/13/15 1124    Activity Barriers & Risk Stratification   Activity Barriers Arthritis;Joint Problems;Assistive Device;Other (comment)   Comments 2 canes and knee braces for arthritis in both knees   Risk Stratification High      6 Minute Walk:     6 Minute Walk      01/11/15 1125       6 Minute Walk   Phase Initial     Distance 418 feet     Walk Time 6 minutes     Resting HR 86 bpm     Resting BP 148/94 mmHg     Max Ex. HR 86 bpm     Max Ex. BP 150/100 mmHg     RPE 9     Symptoms Yes (comment)     Comments NO symptoms   used NUSTEP        Initial Exercise Prescription:   Exercise Prescription Changes:     Exercise  Prescription Changes      02/02/15 0800 02/09/15 0800 02/15/15 0700 03/02/15 0800 03/09/15 0800   Exercise Review   Progression _0    Response to Exercise   Blood Pressure (Admit)   140/84 mmHg  140/84 mmHg   Blood Pressure (Exercise)   126/72 mmHg  126/72 mmHg   Blood Pressure (Exit)   114/80 mmHg  114/80 mmHg   Heart Rate (Admit)   77 bpm  77 bpm   Heart Rate (Exercise)   83 bpm  83 bpm   Heart Rate (Exit)   71 bpm  71 bpm   Rating of Perceived Exertion (Exercise)   12  12   Frequency   Add 1 additional day to program exercise sessions.  Add 1 additional day to program exercise sessions.   Duration   Progress to 50 minutes of aerobic without signs/symptoms of physical distress  Progress to 50 minutes of aerobic without signs/symptoms of physical distress   Intensity   THRR unchanged  THRR unchanged   Progression   Continue progressive overload as per policy without signs/symptoms or physical distress.  Continue progressive overload as per policy without signs/symptoms or physical distress.   Resistance Training   Training Prescription   Yes  Yes   Weight   3  3   Reps   10-12  10-12   Interval Training   Interval Training   Yes  Yes   Equipment   NuStep;Recumbant Bike  NuStep;Recumbant Bike   Recumbant Bike   Level _1 RPM 70  70 65 70   Minutes   25  25   NuStep   Level _2 XNATF 57  32  202   Minutes   20  20      Discharge Exercise Prescription:     Discharge Prescription - 02/09/15 0900    Exercise Review   Progression Yes   Home Exercise Plan   Plans to continue exercise at Home      Nutrition:  Target Goals: Understanding of nutrition guidelines, daily intake of sodium <1543m, cholesterol <2022m calories 30% from fat and 7% or less from saturated fats, daily to have 5 or more servings of fruits and vegetables.  Biometrics:     Pre Biometrics - 01/11/15 1127    Pre Biometrics   Height _3  (1.727 m)   Weight 198 lb 14.4 oz  (90.22 kg)   Waist Circumference 41 inches  Hip Circumference 43 inches   Waist to Hip Ratio 0.95 %   BMI (Calculated) 30.3       Nutrition Therapy Plan and Nutrition Goals:   Nutrition Discharge: Rate Your Plate Scores:     Rate Your Plate - 10/02/30 3557    Rate Your Plate Scores   Post Score 69   Post Score % 74 %      Nutrition Goals Re-Evaluation:     Nutrition Goals Re-Evaluation      02/14/15 0926           Personal Goal #1 Re-Evaluation   Personal Goal #1 [name] doesn't feel he needs to meet with the dietician. [name] reports his wife cooks healthy. He said he cut out salt 10 years ago and uses pepper instead.        Goal Progress Seen Yes          Psychosocial: Target Goals: Acknowledge presence or absence of depression, maximize coping skills, provide positive support system. Participant is able to verbalize types and ability to use techniques and skills needed for reducing stress and depression.  Initial Review & Psychosocial Screening:   Quality of Life Scores:     Quality of Life - 03/11/15 1001    Quality of Life Scores   Health/Function Post 25 %   Socioeconomic Post 29.69 %   Psych/Spiritual Post 29.64 %   Family Post 30 %   GLOBAL Post 27.71 %      PHQ-9:     Recent Review Flowsheet Data    Depression screen [facility] 2/9 03/11/2015 01/07/2015 01/04/2014 09/10/2013 09/10/2013   Decreased Interest 0 3 0 0 0   Down, Depressed, Hopeless 0 1 0 1 2   PHQ - 2 Score 0 4 0 1 2   Altered sleeping 0 3 - 0 -   Tired, decreased energy 0 3 - 2 -   Change in appetite 0 2 - 0 -   Feeling bad or failure about yourself  0 3 - 0 -   Trouble concentrating 0 1 - 0 -   Moving slowly or fidgety/restless 0 0 - 0 -   Suicidal thoughts 0 0 - 0 -   PHQ-9 Score 0 16 - 3 -   Difficult doing work/chores Not difficult at all Somewhat difficult - - -      Psychosocial Evaluation and Intervention:     Psychosocial Evaluation - 02/07/15 0959    Psychosocial  Evaluation & Interventions   Interventions Stress management education;Relaxation education;Encouraged to exercise with the program and follow exercise prescription   Comments Counselor  met today with Mr. Everage for initial psychosocial evaluation.  He is a 74 year old hard-working gentleman who has had heart problems since 2008.  He has a strong support system with a spouse of 79 years, an adult daughter who lives close by and is actively involved in his faith community.  Mr. Nooney was walking with (2) knee braces and the use of (2) canes and reports needing "knee Replacement" when he completes this program.  He states he has a history of depression and anxiety symptoms and has been on medications since 2008, which he reports work well for him currently with no current symptoms.  His PHQ-9 was "14" and counselor reviewed that with Mr. Lewelling reporting since coming into the program several weeks ago, he is already feeling less hopeless and less tired or bad about himself.  He also states he is seeing progress in  that he is moving better, knees are stronger, breathing better and generally has more energy.  His goal for this program are to continue this progress and to return to more normal household activities.  He is a very determined individual, so I predict he will work hard and accomplish these goals, in order to be strong enough to have the knee replacement surgeries.  Counselor will continue to follow as needed.     Continued Psychosocial Services Needed Yes  Mr. Krempasky will benefit from all of the psychoeducational components of this program as he needs to learn how to manage stress better and the benefit of relaxation.        Psychosocial Re-Evaluation:   Vocational Rehabilitation: Provide vocational rehab assistance to qualifying candidates.   Vocational Rehab Evaluation & Intervention:     Vocational Rehab - 02/13/15 1125    Initial Vocational Rehab Evaluation & Intervention    Assessment shows need for Vocational Rehabilitation No      Education: Education Goals: Education classes will be provided on a weekly basis, covering required topics. Participant will state understanding/return demonstration of topics presented.  Learning Barriers/Preferences:     Learning Barriers/Preferences - 02/13/15 1124    Learning Barriers/Preferences   Learning Barriers Exercise Concerns   Learning Preferences None      Education Topics: General Nutrition Guidelines/Fats and Fiber: -Group instruction provided by verbal, written material, models and posters to present the general guidelines for heart healthy nutrition. Gives an explanation and review of dietary fats and fiber.   Controlling Sodium/Reading Food Labels: -Group verbal and written material supporting the discussion of sodium use in heart healthy nutrition. Review and explanation with models, verbal and written materials for utilization of the food label.   Exercise Physiology & Risk Factors: - Group verbal and written instruction with models to review the exercise physiology of the cardiovascular system and associated critical values. Details cardiovascular disease risk factors and the goals associated with each risk factor.   Aerobic Exercise & Resistance Training: - Gives group verbal and written discussion on the health impact of inactivity. On the components of aerobic and resistive training programs and the benefits of this training and how to safely progress through these programs.   Flexibility, Balance, General Exercise Guidelines: - Provides group verbal and written instruction on the benefits of flexibility and balance training programs. Provides general exercise guidelines with specific guidelines to those with heart or lung disease. Demonstration and skill practice provided.   Stress Management: - Provides group verbal and written instruction about the health risks of elevated stress, cause of  high stress, and healthy ways to reduce stress.   Depression: - Provides group verbal and written instruction on the correlation between heart/lung disease and depressed mood, treatment options, and the stigmas associated with seeking treatment.   Anatomy & Physiology of the Heart: - Group verbal and written instruction and models provide basic cardiac anatomy and physiology, with the coronary electrical and arterial systems. Review of: AMI, Angina, Valve disease, Heart Failure, Cardiac Arrhythmia, Pacemakers, and the ICD.   Cardiac Procedures: - Group verbal and written instruction and models to describe the testing methods done to diagnose heart disease. Reviews the outcomes of the test results. Describes the treatment choices: Medical Management, Angioplasty, or Coronary Bypass Surgery.   Cardiac Medications: - Group verbal and written instruction to review commonly prescribed medications for heart disease. Reviews the medication, class of the drug, and side effects. Includes the steps to properly store meds and  maintain the prescription regimen.   Go Sex-Intimacy & Heart Disease, Get SMART - Goal Setting: - Group verbal and written instruction through game format to discuss heart disease and the return to sexual intimacy. Provides group verbal and written material to discuss and apply goal setting through the application of the S.M.A.R.T. Method.   Other Matters of the Heart: - Provides group verbal, written materials and models to describe Heart Failure, Angina, Valve Disease, and Diabetes in the realm of heart disease. Includes description of the disease process and treatment options available to the cardiac patient.   Exercise & Equipment Safety: - Individual verbal instruction and demonstration of equipment use and safety with use of the equipment.   Infection Prevention: - Provides verbal and written material to individual with discussion of infection control including proper  hand washing and proper equipment cleaning during exercise session.   Falls Prevention: - Provides verbal and written material to individual with discussion of falls prevention and safety.   Diabetes: - Individual verbal and written instruction to review signs/symptoms of diabetes, desired ranges of glucose level fasting, after meals and with exercise. Advice that pre and post exercise glucose checks will be done for 3 sessions at entry of program.    Knowledge Questionnaire Score:     Knowledge Questionnaire Score - 03/11/15 1002    Knowledge Questionnaire Score   Post Score 27      Personal Goals and Risk Factors at Admission:     Personal Goals and Risk Factors at Admission - 02/13/15 1127    Personal Goals and Risk Factors on Admission   Increase Aerobic Exercise and Physical Activity Yes   Intervention While in program, learn and follow the exercise prescription taught. Start at a low level workload and increase workload after able to maintain previous level for 30 minutes. Increase time before increasing intensity.   Understand more about Heart/Pulmonary Disease. Yes   Intervention While in program utilize professionals for any questions, and attend the education sessions. Great websites to use are www.americanheart.org or www.lung.org for reliable information.   Diabetes No   Hypertension Yes   Goal Participant will see blood pressure controlled within the values of 140/86m/Hg or within value directed by their physician.   Intervention Provide nutrition & aerobic exercise along with prescribed medications to achieve BP 140/90 or less.   Lipids Yes   Goal Cholesterol controlled with medications as prescribed, with individualized exercise RX and with personalized nutrition plan. Value goals: LDL < 712m HDL > 4010mParticipant states understanding of desired cholesterol values and following prescriptions.   Intervention Provide nutrition & aerobic exercise along with  prescribed medications to achieve LDL <53m67mDL >40mg1m   Personal Goals and Risk Factors Review:      Goals and Risk Factor Review      02/14/15 0928 02/25/15 1250         Increase Aerobic Exercise and Physical Activity   Goals Progress/Improvement seen  Yes Yes      Comments Is exercising on the Recumbent bike level 5! Ken rYvone Neurts he feels stronger and feels Cardiac Rehab has helped him.  Improving in level and watts on the NS and RB, really enjoying the benefits of exercise and the stamina he has. He feels stronger and more comfortable and stable walking in public than before rehab      Understand more about Heart/Pulmonary Disease   Goals Progress/Improvement seen  Yes       Other Goal  Goals Progress/Improvement seen   Yes      Comments  His stability with walking is improving. He has to rely less on his canes and can move more freely and confidently than before rehab. This is the biggest difference he has noticed.          Personal Goals Discharge:     Comments: Soon ready for discharge

## 2015-03-11 NOTE — Progress Notes (Signed)
Daily Session Note  Patient Details  Name: EDUIN FRIEDEL MRN: [id number] Date of Birth: 26-Oct-1940 Referring Provider:  Einar Pheasant, MD  Encounter Date: 03/11/2015  Check In:     Session Check In - 03/11/15 0903    Check-In   Staff Present Heath Lark RN, BSN, CCRP;Eunice Oldaker Dillard Essex MS, ACSM CEP Exercise Physiologist;Carroll Enterkin RN, BSN   ER physicians immediately available to respond to emergencies See telemetry face sheet for immediately available ER MD   Medication changes reported     No   Fall or balance concerns reported    No   Warm-up and Cool-down Performed on first and last piece of equipment   VAD Patient? No   Pain Assessment   Currently in Pain? No/denies   Multiple Pain Sites No         Goals Met:  Independence with exercise equipment Exercise tolerated well No report of cardiac concerns or symptoms Strength training completed today  Goals Unmet:  Not Applicable  Goals Comments:   Dr. Emily Filbert is Medical Director for [facility].

## 2015-03-15 ENCOUNTER — Encounter: Payer: Self-pay | Admitting: *Deleted

## 2015-03-15 DIAGNOSIS — Z9861 Coronary angioplasty status: Secondary | ICD-10-CM

## 2015-03-15 NOTE — Progress Notes (Signed)
Cardiac Individual Treatment Plan  Patient Details  Name: Daniel Austin MRN: [id number] Date of Birth: 02-18-1941 Referring Provider: Dr. Einar Pheasant  Initial Encounter Date:  01/11/2015  Visit Diagnosis: S/P PTCA (percutaneous transluminal coronary angioplasty)  Patient's Home Medications on Admission:  Current outpatient prescriptions:  .  Aspirin 81 MG EC tablet, Take 81 mg by mouth daily., Disp: , Rfl:  .  citalopram (CELEXA) 20 MG tablet, TAKE 1 TABLET (20 MG TOTAL) BY MOUTH DAILY., Disp: 90 tablet, Rfl: 0 .  clopidogrel (PLAVIX) 75 MG tablet, TAKE 1 TABLET (75 MG TOTAL) BY MOUTH DAILY., Disp: 30 tablet, Rfl: 5 .  CRESTOR 20 MG tablet, Take 20 mg by mouth daily., Disp: , Rfl: 11 .  fexofenadine (ALLEGRA) 180 MG tablet, Take 180 mg by mouth daily., Disp: , Rfl:  .  levothyroxine (SYNTHROID, LEVOTHROID) 112 MCG tablet, TAKE 1 TABLET (112 MCG TOTAL) BY MOUTH DAILY., Disp: 30 tablet, Rfl: 5 .  lisinopril (PRINIVIL,ZESTRIL) 40 MG tablet, TAKE 1 TABLET (40 MG TOTAL) BY MOUTH DAILY., Disp: 30 tablet, Rfl: 5 .  LORazepam (ATIVAN) 1 MG tablet, TAKE 1 TABLET BY MOUTH AT BEDTIME AS NEEDED FOR ANXIETY, Disp: 30 tablet, Rfl: 1 .  metoprolol tartrate (LOPRESSOR) 25 MG tablet, Take 1 tablet (25 mg total) by mouth 2 (two) times daily., Disp: 180 tablet, Rfl: 3 .  naproxen sodium (ANAPROX) 220 MG tablet, Take 220 mg by mouth as needed (knee pain)., Disp: , Rfl:  .  nitroGLYCERIN (NITROSTAT) 0.4 MG SL tablet, Place 0.4 mg under the tongue every 5 (five) minutes as needed. If a third tablet is needed please call 911., Disp: , Rfl:  .  pantoprazole (PROTONIX) 40 MG tablet, TAKE 1 TABLET (40 MG TOTAL) BY MOUTH DAILY., Disp: 30 tablet, Rfl: 5  Past Medical History: Past Medical History  Diagnosis Date  . Hypercholesterolemia   . Hypothyroidism   . Hypertension   . Fractures, multiple     Metal rod in left foot  . CAD (coronary artery disease)   . Carotid artery stenosis     high-grade  symptomatic right sided s/p stent twice  . Syncope     second degree heart block s/p [employer] pacemaker placement 12/08  . Anemia   . Ulcer of gastric fundus   . Hyperglycemia     Tobacco Use: History  Smoking status  . Never Smoker   Smokeless tobacco  . Never Used    Labs: Recent Review Flowsheet Data    Labs for ITP Cardiac and Pulmonary Rehab Latest Ref Rng 09/02/2013 12/31/2013 05/25/2014 09/28/2014 01/07/2015   Cholestrol 0 - 200 mg/dL 159 154 141 159 130   LDLCALC 0 - 99 mg/dL 91 96 80 87 63   HDL >39.00 mg/dL 49.60 43.40 44.40 44.90 44.40   Trlycerides 0.0 - 149.0 mg/dL 93.0 71.0 81.0 134.0 115.0   Hemoglobin A1c 4.6 - 6.5 % 6.0 6.1 6.0 6.0 5.9       Exercise Target Goals:    Exercise Program Goal: Individual exercise prescription set with THRR, safety & activity barriers. Participant demonstrates ability to understand and report RPE using BORG scale, to self-measure pulse accurately, and to acknowledge the importance of the exercise prescription.  Exercise Prescription Goal: Starting with aerobic activity 30 plus minutes a day, 3 days per week for initial exercise prescription. Provide home exercise prescription and guidelines that participant acknowledges understanding prior to discharge.  Activity Barriers & Risk Stratification:     Activity Barriers &  Risk Stratification - 02/13/15 1124    Activity Barriers & Risk Stratification   Activity Barriers Arthritis;Joint Problems;Assistive Device;Other (comment)   Comments 2 canes and knee braces for arthritis in both knees   Risk Stratification High      6 Minute Walk:     6 Minute Walk      01/11/15 1125       6 Minute Walk   Phase Initial     Distance 418 feet     Walk Time 6 minutes     Resting HR 86 bpm     Resting BP 148/94 mmHg     Max Ex. HR 86 bpm     Max Ex. BP 150/100 mmHg     RPE 9     Symptoms Yes (comment)     Comments NO symptoms   used NUSTEP        Initial Exercise  Prescription:   Exercise Prescription Changes:     Exercise Prescription Changes      02/02/15 0800 02/09/15 0800 02/15/15 0700 03/02/15 0800 03/09/15 0800   Exercise Review   Progression Yes Yes Yes Yes Yes   Response to Exercise   Blood Pressure (Admit)   140/84 mmHg  140/84 mmHg   Blood Pressure (Exercise)   126/72 mmHg  126/72 mmHg   Blood Pressure (Exit)   114/80 mmHg  114/80 mmHg   Heart Rate (Admit)   77 bpm  77 bpm   Heart Rate (Exercise)   83 bpm  83 bpm   Heart Rate (Exit)   71 bpm  71 bpm   Rating of Perceived Exertion (Exercise)   12  12   Frequency   Add 1 additional day to program exercise sessions.  Add 1 additional day to program exercise sessions.   Duration   Progress to 50 minutes of aerobic without signs/symptoms of physical distress  Progress to 50 minutes of aerobic without signs/symptoms of physical distress   Intensity   THRR unchanged  THRR unchanged   Progression   Continue progressive overload as per policy without signs/symptoms or physical distress.  Continue progressive overload as per policy without signs/symptoms or physical distress.   Resistance Training   Training Prescription   Yes  Yes   Weight   3  3   Reps   10-12  10-12   Interval Training   Interval Training   Yes  Yes   Equipment   NuStep;Recumbant Bike  NuStep;Recumbant Bike   Recumbant Bike   Level '6  6 7 6   ' RPM 70  70 65 70   Minutes   25  25   NuStep   Level '5  6  8   ' Watts 70  70  115   Minutes   20  20     03/15/15 0700           Exercise Review   Progression Yes       Response to Exercise   Blood Pressure (Admit) 102/70 mmHg       Blood Pressure (Exercise) 132/64 mmHg       Blood Pressure (Exit) 110/70 mmHg       Heart Rate (Admit) 74 bpm       Heart Rate (Exercise) 96 bpm       Heart Rate (Exit) 79 bpm       Rating of Perceived Exertion (Exercise) 13       Frequency --  Duration Progress to 50 minutes of aerobic without signs/symptoms of physical distress        Intensity THRR unchanged       Progression Continue progressive overload as per policy without signs/symptoms or physical distress.       Resistance Training   Training Prescription Yes       Weight 3       Reps 10-15       Interval Training   Interval Training Yes       Equipment NuStep;Recumbant Bike       Recumbant Bike   Level 12       RPM 50       Minutes 25       NuStep   Level 8       Watts 115       Minutes 20          Discharge Exercise Prescription (Final Exercise Prescription Changes):     Exercise Prescription Changes - 03/15/15 0700    Exercise Review   Progression Yes   Response to Exercise   Blood Pressure (Admit) 102/70 mmHg   Blood Pressure (Exercise) 132/64 mmHg   Blood Pressure (Exit) 110/70 mmHg   Heart Rate (Admit) 74 bpm   Heart Rate (Exercise) 96 bpm   Heart Rate (Exit) 79 bpm   Rating of Perceived Exertion (Exercise) 13   Frequency --   Duration Progress to 50 minutes of aerobic without signs/symptoms of physical distress   Intensity THRR unchanged   Progression Continue progressive overload as per policy without signs/symptoms or physical distress.   Resistance Training   Training Prescription Yes   Weight 3   Reps 10-15   Interval Training   Interval Training Yes   Equipment NuStep;Recumbant Bike   Recumbant Bike   Level 12   RPM 50   Minutes 25   NuStep   Level 8   Watts 115   Minutes 20      Nutrition:  Target Goals: Understanding of nutrition guidelines, daily intake of sodium <1516m, cholesterol <2089m calories 30% from fat and 7% or less from saturated fats, daily to have 5 or more servings of fruits and vegetables.  Biometrics:     Pre Biometrics - 01/11/15 1127    Pre Biometrics   Height '5\' 8"'  (1.727 m)   Weight 198 lb 14.4 oz (90.22 kg)   Waist Circumference 41 inches   Hip Circumference 43 inches   Waist to Hip Ratio 0.95 %   BMI (Calculated) 30.3       Nutrition Therapy Plan and Nutrition Goals:      Nutrition Therapy & Goals - 03/15/15 1040    Personal Nutrition Goals   Personal Goal #1 KeYvone Neuoesn't feel he needs to meet with the dietician. KeYvone Neueports his wife cooks healthy. He said he cut out salt 10 years ago and uses pepper instead.       Nutrition Discharge: Rate Your Plate Scores:     Rate Your Plate - 0694/[id number]/1605537  Rate Your Plate Scores   Post Score 69   Post Score % 74 %      Nutrition Goals Re-Evaluation:     Nutrition Goals Re-Evaluation      02/14/15 0926           Personal Goal #1 Re-Evaluation   Personal Goal #1 KeYvone Neuoesn't feel he needs to meet with the dietician. KeYvone Neueports his wife cooks healthy. He said he cut  out salt 10 years ago and uses pepper instead.        Goal Progress Seen Yes          Psychosocial: Target Goals: Acknowledge presence or absence of depression, maximize coping skills, provide positive support system. Participant is able to verbalize types and ability to use techniques and skills needed for reducing stress and depression.  Initial Review & Psychosocial Screening:   Quality of Life Scores:     Quality of Life - 03/11/15 1001    Quality of Life Scores   Health/Function Post 25 %   Socioeconomic Post 29.69 %   Psych/Spiritual Post 29.64 %   Family Post 30 %   GLOBAL Post 27.71 %      PHQ-9:     Recent Review Flowsheet Data    Depression screen [facility] 2/9 03/11/2015 01/07/2015 01/04/2014 09/10/2013 09/10/2013   Decreased Interest 0 3 0 0 0   Down, Depressed, Hopeless 0 1 0 1 2   PHQ - 2 Score 0 4 0 1 2   Altered sleeping 0 3 - 0 -   Tired, decreased energy 0 3 - 2 -   Change in appetite 0 2 - 0 -   Feeling bad or failure about yourself  0 3 - 0 -   Trouble concentrating 0 1 - 0 -   Moving slowly or fidgety/restless 0 0 - 0 -   Suicidal thoughts 0 0 - 0 -   PHQ-9 Score 0 16 - 3 -   Difficult doing work/chores Not difficult at all Somewhat difficult - - -      Psychosocial Evaluation and Intervention:      Psychosocial Evaluation - 02/07/15 0959    Psychosocial Evaluation & Interventions   Interventions Stress management education;Relaxation education;Encouraged to exercise with the program and follow exercise prescription   Comments Counselor  met today with Mr. Blackerby for initial psychosocial evaluation.  He is a 74 year old hard-working gentleman who has had heart problems since 2008.  He has a strong support system with a spouse of 34 years, an adult daughter who lives close by and is actively involved in his faith community.  Mr. Duford was walking with (2) knee braces and the use of (2) canes and reports needing "knee Replacement" when he completes this program.  He states he has a history of depression and anxiety symptoms and has been on medications since 2008, which he reports work well for him currently with no current symptoms.  His PHQ-9 was "87" and counselor reviewed that with Mr. Rochin reporting since coming into the program several weeks ago, he is already feeling less hopeless and less tired or bad about himself.  He also states he is seeing progress in that he is moving better, knees are stronger, breathing better and generally has more energy.  His goal for this program are to continue this progress and to return to more normal household activities.  He is a very determined individual, so I predict he will work hard and accomplish these goals, in order to be strong enough to have the knee replacement surgeries.  Counselor will continue to follow as needed.     Continued Psychosocial Services Needed Yes  Mr. Tonkinson will benefit from all of the psychoeducational components of this program as he needs to learn how to manage stress better and the benefit of relaxation.        Psychosocial Re-Evaluation:   Vocational Rehabilitation: Provide vocational rehab assistance  to qualifying candidates.   Vocational Rehab Evaluation & Intervention:     Vocational Rehab - 02/13/15 1125     Initial Vocational Rehab Evaluation & Intervention   Assessment shows need for Vocational Rehabilitation No      Education: Education Goals: Education classes will be provided on a weekly basis, covering required topics. Participant will state understanding/return demonstration of topics presented.  Learning Barriers/Preferences:     Learning Barriers/Preferences - 02/13/15 1124    Learning Barriers/Preferences   Learning Barriers Exercise Concerns   Learning Preferences None      Education Topics: General Nutrition Guidelines/Fats and Fiber: -Group instruction provided by verbal, written material, models and posters to present the general guidelines for heart healthy nutrition. Gives an explanation and review of dietary fats and fiber.   Controlling Sodium/Reading Food Labels: -Group verbal and written material supporting the discussion of sodium use in heart healthy nutrition. Review and explanation with models, verbal and written materials for utilization of the food label.   Exercise Physiology & Risk Factors: - Group verbal and written instruction with models to review the exercise physiology of the cardiovascular system and associated critical values. Details cardiovascular disease risk factors and the goals associated with each risk factor.   Aerobic Exercise & Resistance Training: - Gives group verbal and written discussion on the health impact of inactivity. On the components of aerobic and resistive training programs and the benefits of this training and how to safely progress through these programs.   Flexibility, Balance, General Exercise Guidelines: - Provides group verbal and written instruction on the benefits of flexibility and balance training programs. Provides general exercise guidelines with specific guidelines to those with heart or lung disease. Demonstration and skill practice provided.   Stress Management: - Provides group verbal and written instruction  about the health risks of elevated stress, cause of high stress, and healthy ways to reduce stress.   Depression: - Provides group verbal and written instruction on the correlation between heart/lung disease and depressed mood, treatment options, and the stigmas associated with seeking treatment.   Anatomy & Physiology of the Heart: - Group verbal and written instruction and models provide basic cardiac anatomy and physiology, with the coronary electrical and arterial systems. Review of: AMI, Angina, Valve disease, Heart Failure, Cardiac Arrhythmia, Pacemakers, and the ICD.   Cardiac Procedures: - Group verbal and written instruction and models to describe the testing methods done to diagnose heart disease. Reviews the outcomes of the test results. Describes the treatment choices: Medical Management, Angioplasty, or Coronary Bypass Surgery.   Cardiac Medications: - Group verbal and written instruction to review commonly prescribed medications for heart disease. Reviews the medication, class of the drug, and side effects. Includes the steps to properly store meds and maintain the prescription regimen.   Go Sex-Intimacy & Heart Disease, Get SMART - Goal Setting: - Group verbal and written instruction through game format to discuss heart disease and the return to sexual intimacy. Provides group verbal and written material to discuss and apply goal setting through the application of the S.M.A.R.T. Method.   Other Matters of the Heart: - Provides group verbal, written materials and models to describe Heart Failure, Angina, Valve Disease, and Diabetes in the realm of heart disease. Includes description of the disease process and treatment options available to the cardiac patient.   Exercise & Equipment Safety: - Individual verbal instruction and demonstration of equipment use and safety with use of the equipment.   Infection Prevention: - Provides verbal  and written material to individual  with discussion of infection control including proper hand washing and proper equipment cleaning during exercise session.   Falls Prevention: - Provides verbal and written material to individual with discussion of falls prevention and safety.   Diabetes: - Individual verbal and written instruction to review signs/symptoms of diabetes, desired ranges of glucose level fasting, after meals and with exercise. Advice that pre and post exercise glucose checks will be done for 3 sessions at entry of program.    Knowledge Questionnaire Score:     Knowledge Questionnaire Score - 03/15/15 1039    Knowledge Questionnaire Score   Pre Score 17/28   Post Score 27      Personal Goals and Risk Factors at Admission:     Personal Goals and Risk Factors at Admission - 02/13/15 1127    Personal Goals and Risk Factors on Admission   Increase Aerobic Exercise and Physical Activity Yes   Intervention While in program, learn and follow the exercise prescription taught. Start at a low level workload and increase workload after able to maintain previous level for 30 minutes. Increase time before increasing intensity.   Understand more about Heart/Pulmonary Disease. Yes   Intervention While in program utilize professionals for any questions, and attend the education sessions. Great websites to use are www.americanheart.org or www.lung.org for reliable information.   Diabetes No   Hypertension Yes   Goal Participant will see blood pressure controlled within the values of 140/74m/Hg or within value directed by their physician.   Intervention Provide nutrition & aerobic exercise along with prescribed medications to achieve BP 140/90 or less.   Lipids Yes   Goal Cholesterol controlled with medications as prescribed, with individualized exercise RX and with personalized nutrition plan. Value goals: LDL < 798m HDL > 4047mParticipant states understanding of desired cholesterol values and following  prescriptions.   Intervention Provide nutrition & aerobic exercise along with prescribed medications to achieve LDL <27m12mDL >40mg88m   Personal Goals and Risk Factors Review:      Goals and Risk Factor Review      02/14/15 0928 02/25/15 1250         Increase Aerobic Exercise and Physical Activity   Goals Progress/Improvement seen  Yes Yes      Comments Is exercising on the Recumbent bike level 5! Ken rYvone Neurts he feels stronger and feels Cardiac Rehab has helped him.  Improving in level and watts on the NS and RB, really enjoying the benefits of exercise and the stamina he has. He feels stronger and more comfortable and stable walking in public than before rehab      Understand more about Heart/Pulmonary Disease   Goals Progress/Improvement seen  Yes       Other Goal   Goals Progress/Improvement seen   Yes      Comments  His stability with walking is improving. He has to rely less on his canes and can move more freely and confidently than before rehab. This is the biggest difference he has noticed.          Personal Goals Discharge:      Personal Goals at Discharge - 03/15/15 1041    Increase Aerobic Exercise and Physical Activity   Goals Progress/Improvement seen  Yes   Comments Improving in level and watts on the NS and RB, really enjoying the benefits of exercise and the stamina he has. He feels stronger and more comfortable and stable walking in public than  before rehab   Understand more about Heart/Pulmonary Disease   Goals Progress/Improvement seen  Yes   Other Goal   Goals Progress/Improvement seen  Yes   Comments His stability with walking is improving. He has to rely less on his canes and can move more freely and confidently than before rehab. This is the biggest difference he has noticed.        Comments: 30 day review. Continue with ITP.

## 2015-03-16 ENCOUNTER — Other Ambulatory Visit: Payer: Self-pay | Admitting: *Deleted

## 2015-03-16 DIAGNOSIS — Z9861 Coronary angioplasty status: Secondary | ICD-10-CM

## 2015-03-16 NOTE — Progress Notes (Signed)
Daily Session Note  Patient Details  Name: Daniel Austin MRN: [id number] Date of Birth: 10-Jan-1941 Referring Provider:  Einar Pheasant, MD  Encounter Date: 03/16/2015  Check In:     Session Check In - 03/16/15 0854    Check-In   Staff Present Heath Lark RN, BSN, CCRP;Jacqlyn Marolf BS, ACSM EP-C, Exercise Physiologist;Renee Dillard [place] MS, ACSM CEP Exercise Physiologist   ER physicians immediately available to respond to emergencies See telemetry face sheet for immediately available ER MD   Medication changes reported     No   Fall or balance concerns reported    No   Warm-up and Cool-down Performed on first and last piece of equipment   VAD Patient? No   Pain Assessment   Currently in Pain? No/denies         Goals Met:  Proper associated with RPD/PD & O2 Sat Exercise tolerated well No report of cardiac concerns or symptoms Strength training completed today  Goals Unmet:  Not Applicable  Goals Comments:    Dr. Emily Filbert is Medical Director for [facility].

## 2015-03-18 ENCOUNTER — Encounter: Payer: Medicare Other | Admitting: *Deleted

## 2015-03-18 DIAGNOSIS — Z9861 Coronary angioplasty status: Secondary | ICD-10-CM | POA: Diagnosis not present

## 2015-03-18 NOTE — Progress Notes (Signed)
Daily Session Note  Patient Details  Name: Daniel Austin MRN: [id number] Date of Birth: 03/04/1941 Referring Provider:  Einar Pheasant, MD  Encounter Date: 03/18/2015  Check In:     Session Check In - 03/18/15 0907    Check-In   Staff Present Candiss Norse MS, ACSM CEP Exercise Physiologist;Carroll Enterkin RN, BSN;Susanne Bice RN, BSN, [place]   ER physicians immediately available to respond to emergencies See telemetry face sheet for immediately available ER MD   Medication changes reported     No   Fall or balance concerns reported    No   Warm-up and Cool-down Performed on first and last piece of equipment   VAD Patient? No   Pain Assessment   Currently in Pain? No/denies   Multiple Pain Sites No         Goals Met:  Independence with exercise equipment Exercise tolerated well No report of cardiac concerns or symptoms Strength training completed today  Goals Unmet:  Not Applicable  Goals Comments   Dr. Emily Filbert is Medical Director for [facility].

## 2015-03-20 ENCOUNTER — Encounter: Payer: Self-pay | Admitting: Internal Medicine

## 2015-03-20 NOTE — Assessment & Plan Note (Signed)
Blood pressure doing well.  Same medication regimen.  Follow pressures.  Follow metabolic panel.   

## 2015-03-20 NOTE — Assessment & Plan Note (Signed)
EGD 12/20/10 as outlined.  Colonoscopy 12/20/10 - diverticulosis.  Follow cbc.

## 2015-03-20 NOTE — Assessment & Plan Note (Signed)
Has OA.  Limits his ability to stand and walk.  Has to postpone surgery secondary to his underlying heart issues.  Follow.

## 2015-03-20 NOTE — Assessment & Plan Note (Signed)
Cath 11/04/14 - revealed three-vessel CAD as outlined.  S/p stent to mid LAD.  On plavix.  Seeing Dr Saralyn Pilar.  Going to cardiac rehab.  Doing well.  Energy better.  Continue.

## 2015-03-20 NOTE — Assessment & Plan Note (Signed)
Low cholesterol diet and exercise.  On crestor.  Follow lipid panel and liver function tests.   Lab Results  Component Value Date   CHOL 130 01/07/2015   HDL 44.40 01/07/2015   LDLCALC 63 01/07/2015   TRIG 115.0 01/07/2015   CHOLHDL 3 01/07/2015

## 2015-03-20 NOTE — Progress Notes (Signed)
Patient ID: Daniel Austin, male   DOB: 04-11-1941, 74 y.o.   MRN: [id number]   Subjective:    Patient ID: Daniel Austin, male    DOB: [birth year], 74 y.o.   MRN: [id number]  HPI  Patient here for a scheduled follow up.  Is s/p cardiac stent placement 2/11/6.  Cardiac cath revealed three-vessel CAD with heavily calcified proximal, mid and distal LAD disease.  Is s/p placement of drug-eluting stent to mid LAD.  EF - 45%.  Going to rehab.  Has done well with this.  Energy better.  Breathing better.  He is now able to walk to his mailbox.  He has OA of both knees.  This limits his ability to walk and stand for long periods of time.  Very limited.  Unable to return to work at this time. Overall doing better.    Past Medical History  Diagnosis Date  . Hypercholesterolemia   . Hypothyroidism   . Hypertension   . Fractures, multiple     Metal rod in left foot  . CAD (coronary artery disease)   . Carotid artery stenosis     high-grade symptomatic right sided s/p stent twice  . Syncope     second degree heart block s/p [employer] pacemaker placement 12/08  . Anemia   . Ulcer of gastric fundus   . Hyperglycemia     Current Outpatient Prescriptions on File Prior to Visit  Medication Sig Dispense Refill  . Aspirin 81 MG EC tablet Take 81 mg by mouth daily.    . citalopram (CELEXA) 20 MG tablet TAKE 1 TABLET (20 MG TOTAL) BY MOUTH DAILY. 90 tablet 0  . clopidogrel (PLAVIX) 75 MG tablet TAKE 1 TABLET (75 MG TOTAL) BY MOUTH DAILY. 30 tablet 5  . CRESTOR 20 MG tablet Take 20 mg by mouth daily.  11  . fexofenadine (ALLEGRA) 180 MG tablet Take 180 mg by mouth daily.    Marland Kitchen levothyroxine (SYNTHROID, LEVOTHROID) 112 MCG tablet TAKE 1 TABLET (112 MCG TOTAL) BY MOUTH DAILY. 30 tablet 5  . lisinopril (PRINIVIL,ZESTRIL) 40 MG tablet TAKE 1 TABLET (40 MG TOTAL) BY MOUTH DAILY. 30 tablet 5  . metoprolol tartrate (LOPRESSOR) 25 MG tablet Take 1 tablet (25 mg total) by mouth 2 (two) times daily. 180 tablet 3    . naproxen sodium (ANAPROX) 220 MG tablet Take 220 mg by mouth as needed (knee pain).    . nitroGLYCERIN (NITROSTAT) 0.4 MG SL tablet Place 0.4 mg under the tongue every 5 (five) minutes as needed. If a third tablet is needed please call 911.    . pantoprazole (PROTONIX) 40 MG tablet TAKE 1 TABLET (40 MG TOTAL) BY MOUTH DAILY. 30 tablet 5   No current facility-administered medications on file prior to visit.    Review of Systems  Constitutional: Negative for appetite change and unexpected weight change.  HENT: Negative for congestion and sinus pressure.   Respiratory: Negative for cough, chest tightness and shortness of breath (breathing is better. ).   Cardiovascular: Negative for chest pain, palpitations and leg swelling.  Gastrointestinal: Negative for nausea, vomiting, abdominal pain and diarrhea.  Musculoskeletal:       Increased knee pain.  Limits walking and standing.   Skin: Negative for color change and rash.  Neurological: Negative for dizziness, light-headedness and headaches.  Psychiatric/Behavioral: Negative for dysphoric mood and agitation.       Feels better.  States previously was mildly depressed.  This has improved.  Objective:     Pulse recheck:  64  Physical Exam  Constitutional: He appears well-developed and well-nourished. No distress.  HENT:  Nose: Nose normal.  Mouth/Throat: Oropharynx is clear and moist.  Neck: Neck supple. No thyromegaly present.  Cardiovascular: Normal rate and regular rhythm.   Pulmonary/Chest: Effort normal and breath sounds normal. No respiratory distress.  Abdominal: Soft. Bowel sounds are normal. There is no tenderness.  Musculoskeletal: He exhibits no edema or tenderness.  Lymphadenopathy:    He has no cervical adenopathy.  Skin: No rash noted. No erythema.  Psychiatric: He has a normal mood and affect. His behavior is normal.    BP 122/80 mmHg  Pulse 61  Temp(Src) 98.4 F (36.9 C) (Oral)  Ht _0  (1.727 m)  Wt  197 lb (89.359 kg)  BMI 29.96 kg/m2  SpO2 96% Wt Readings from Last 3 Encounters:  03/09/15 197 lb (89.359 kg)  01/11/15 198 lb 14.4 oz (90.22 kg)  01/07/15 196 lb 8 oz (89.132 kg)     Lab Results  Component Value Date   WBC 4.6 09/28/2014   HGB 13.5 09/28/2014   HCT 41.1 09/28/2014   PLT 262.0 09/28/2014   GLUCOSE 97 01/07/2015   CHOL 130 01/07/2015   TRIG 115.0 01/07/2015   HDL 44.40 01/07/2015   LDLCALC 63 01/07/2015   ALT 9 01/07/2015   AST 14 01/07/2015   NA 134* 01/26/2015   K 4.7 01/07/2015   CL 100 01/07/2015   CREATININE 0.84 01/07/2015   BUN 8 01/07/2015   CO2 29 01/07/2015   TSH 1.53 05/25/2014   PSA 0.28 05/28/2014   HGBA1C 5.9 01/07/2015   MICROALBUR 1.6 05/25/2014       Assessment & Plan:   Problem List Items Addressed This Visit    Anemia    EGD 12/20/10 as outlined.  Colonoscopy 12/20/10 - diverticulosis.  Follow cbc.       Carotid artery stenosis - Primary    Had high grade stenosis.  S/p stent x 2.  Continue risk factor modification.  Continue aspirin and plavix.  Continue f/u with vascular surgery.       Coronary atherosclerosis of native coronary artery    Cath 11/04/14 - revealed three-vessel CAD as outlined.  S/p stent to mid LAD.  On plavix.  Seeing Dr Saralyn Pilar.  Going to cardiac rehab.  Doing well.  Energy better.  Continue.       Essential hypertension, benign    Blood pressure doing well.  Same medication regimen.  Follow pressures.  Follow metabolic panel.       Relevant Orders   Basic metabolic panel   Hyperglycemia    Low carb diet and exercise.  Follow met b and a1c.   Lab Results  Component Value Date   HGBA1C 5.9 01/07/2015        Relevant Orders   Hemoglobin A1c   Hypothyroidism    On replacement.  Follow tsh.       Relevant Orders   TSH   Knee pain, bilateral    Has OA.  Limits his ability to stand and walk.  Has to postpone surgery secondary to his underlying heart issues.  Follow.       Left arm numbness     Not an issue now.  Follow.       Pure hypercholesterolemia    Low cholesterol diet and exercise.  On crestor.  Follow lipid panel and liver function tests.   Lab Results  Component  Value Date   CHOL 130 01/07/2015   HDL 44.40 01/07/2015   LDLCALC 63 01/07/2015   TRIG 115.0 01/07/2015   CHOLHDL 3 01/07/2015        Relevant Orders   Lipid panel   Hepatic function panel       Einar Pheasant, MD

## 2015-03-20 NOTE — Assessment & Plan Note (Signed)
Low carb diet and exercise.  Follow met b and a1c.   Lab Results  Component Value Date   HGBA1C 5.9 01/07/2015

## 2015-03-20 NOTE — Assessment & Plan Note (Signed)
Had high grade stenosis.  S/p stent x 2.  Continue risk factor modification.  Continue aspirin and plavix.  Continue f/u with vascular surgery.

## 2015-03-20 NOTE — Assessment & Plan Note (Signed)
Not an issue now.  Follow.    

## 2015-03-20 NOTE — Assessment & Plan Note (Signed)
On replacement.  Follow tsh.  

## 2015-03-23 DIAGNOSIS — Z9861 Coronary angioplasty status: Secondary | ICD-10-CM | POA: Diagnosis not present

## 2015-03-23 NOTE — Progress Notes (Signed)
Daily Session Note  Patient Details  Name: Daniel Austin MRN: [id number] Date of Birth: Jul 08, 1941 Referring Provider:  Einar Pheasant, MD  Encounter Date: 03/23/2015  Check In:     Session Check In - 03/23/15 0831    Check-In   Staff Present Lestine Box BS, ACSM EP-C, Exercise Physiologist;Renee Dillard Essex MS, ACSM CEP Exercise Physiologist;Susanne Bice RN, BSN, CCRP   ER physicians immediately available to respond to emergencies See telemetry face sheet for immediately available ER MD   Medication changes reported     No   Fall or balance concerns reported    No   Warm-up and Cool-down Performed on first and last piece of equipment   VAD Patient? No   Pain Assessment   Currently in Pain? No/denies         Goals Met:  Proper associated with RPD/PD & O2 Sat Exercise tolerated well No report of cardiac concerns or symptoms Strength training completed today  Goals Unmet:  Not Applicable  Goals Comments:    Dr. Emily Filbert is Medical Director for [facility].

## 2015-03-25 ENCOUNTER — Encounter: Payer: Medicare Other | Attending: Internal Medicine | Admitting: *Deleted

## 2015-03-25 DIAGNOSIS — Z9861 Coronary angioplasty status: Secondary | ICD-10-CM | POA: Diagnosis not present

## 2015-03-25 NOTE — Progress Notes (Signed)
Daily Session Note  Patient Details  Name: SARGON SCOUTEN MRN: [id number] Date of Birth: November 16, 1940 Referring Provider:  Einar Pheasant, MD  Encounter Date: 03/25/2015  Check In:     Session Check In - 03/25/15 0903    Check-In   Staff Present Heath Lark RN, BSN, CCRP;Renee Dillard Essex MS, ACSM CEP Exercise Physiologist;Carroll Enterkin RN, BSN   ER physicians immediately available to respond to emergencies See telemetry face sheet for immediately available ER MD   Medication changes reported     No   Fall or balance concerns reported    No   Warm-up and Cool-down Performed on first and last piece of equipment   VAD Patient? No   Pain Assessment   Currently in Pain? No/denies         Goals Met:  Independence with exercise equipment Exercise tolerated well Personal goals reviewed No report of cardiac concerns or symptoms Strength training completed today  Goals Unmet:  Not Applicable  Goals Comments: [facility] session for Ken. He did well with exercise progression and has noted he is walking better when home. He will continue with exercise through [employer].   Dr. Emily Filbert is Medical Director for [facility].

## 2015-03-25 NOTE — Patient Instructions (Signed)
Discharge Instructions  Patient Details  Name: Daniel Austin MRN: [id number] Date of Birth: 03/23/1941 Referring Provider:  Einar Pheasant, MD   Number of Visits: 69  Reason for Discharge:  Patient reached a stable level of exercise. Patient independent in their exercise.  Smoking History:  History  Smoking status  . Never Smoker   Smokeless tobacco  . Never Used    Diagnosis:  No diagnosis found.  Initial Exercise Prescription:   Discharge Exercise Prescription (Final Exercise Prescription Changes):     Exercise Prescription Changes - 03/23/15 0800    Exercise Review   Progression Yes   Response to Exercise   Blood Pressure (Admit) 102/70 mmHg   Blood Pressure (Exercise) 132/64 mmHg   Blood Pressure (Exit) 110/70 mmHg   Heart Rate (Admit) 74 bpm   Heart Rate (Exercise) 96 bpm   Heart Rate (Exit) 79 bpm   Rating of Perceived Exertion (Exercise) 13   Duration Progress to 50 minutes of aerobic without signs/symptoms of physical distress   Intensity THRR unchanged   Progression Continue progressive overload as per policy without signs/symptoms or physical distress.   Resistance Training   Training Prescription Yes   Weight 3   Reps 10-15   Interval Training   Interval Training Yes   Equipment NuStep;Recumbant Bike   Recumbant Bike   Level 13   RPM 45   Minutes 25   NuStep   Level 8   Watts 115   Minutes 20      Functional Capacity:     6 Minute Walk      01/11/15 1125 03/16/15 0906     6 Minute Walk   Phase Initial Discharge    Distance 418 feet 855 feet    Walk Time 6 minutes 6 minutes    Resting HR 86 bpm 63 bpm    Resting BP 148/94 mmHg 110/80 mmHg    Max Ex. HR 86 bpm 89 bpm    Max Ex. BP 150/100 mmHg 130/70 mmHg    RPE 9 12    Perceived Dyspnea   2    Symptoms Yes (comment) No    Comments NO symptoms   used NUSTEP        Quality of Life:     Quality of Life - 03/11/15 1001    Quality of Life Scores   Health/Function Post 25  %   Socioeconomic Post 29.69 %   Psych/Spiritual Post 29.64 %   Family Post 30 %   GLOBAL Post 27.71 %      Personal Goals: Goals established at orientation with interventions provided to work toward goal.     Personal Goals and Risk Factors at Admission - 02/13/15 1127    Personal Goals and Risk Factors on Admission   Increase Aerobic Exercise and Physical Activity Yes   Intervention While in program, learn and follow the exercise prescription taught. Start at a low level workload and increase workload after able to maintain previous level for 30 minutes. Increase time before increasing intensity.   Understand more about Heart/Pulmonary Disease. Yes   Intervention While in program utilize professionals for any questions, and attend the education sessions. Great websites to use are www.americanheart.org or www.lung.org for reliable information.   Diabetes No   Hypertension Yes   Goal Participant will see blood pressure controlled within the values of 140/45mm/Hg or within value directed by their physician.   Intervention Provide nutrition & aerobic exercise along with prescribed medications to achieve BP  140/90 or less.   Lipids Yes   Goal Cholesterol controlled with medications as prescribed, with individualized exercise RX and with personalized nutrition plan. Value goals: LDL < 70mg , HDL > 40mg . Participant states understanding of desired cholesterol values and following prescriptions.   Intervention Provide nutrition & aerobic exercise along with prescribed medications to achieve LDL 70mg , HDL >40mg .       Personal Goals Discharge:     Personal Goals at Discharge - 03/15/15 1041    Increase Aerobic Exercise and Physical Activity   Goals Progress/Improvement seen  Yes   Comments Improving in level and watts on the NS and RB, really enjoying the benefits of exercise and the stamina he has. He feels stronger and more comfortable and stable walking in public than before rehab    Understand more about Heart/Pulmonary Disease   Goals Progress/Improvement seen  Yes   Other Goal   Goals Progress/Improvement seen  Yes   Comments His stability with walking is improving. He has to rely less on his canes and can move more freely and confidently than before rehab. This is the biggest difference he has noticed.       Nutrition & Weight - Outcomes:     Pre Biometrics - 01/11/15 1127    Pre Biometrics   Height 5\' 8"  (1.727 m)   Weight 198 lb 14.4 oz (90.22 kg)   Waist Circumference 41 inches   Hip Circumference 43 inches   Waist to Hip Ratio 0.95 %   BMI (Calculated) 30.3       Nutrition:     Nutrition Therapy & Goals - 03/15/15 1040    Personal Nutrition Goals   Personal Goal #1 Daniel Austin doesn't feel he needs to meet with the dietician. Daniel Austin reports his wife cooks healthy. He said he cut out salt 10 years ago and uses pepper instead.       Nutrition Discharge:     Rate Your Plate - 62/56/38 9373    Rate Your Plate Scores   Post Score 69   Post Score % 74 %      Education Questionnaire Score:     Knowledge Questionnaire Score - 03/15/15 1039    Knowledge Questionnaire Score   Pre Score 17/28   Post Score 27      Goals reviewed with patient; copy given to patient.

## 2015-03-25 NOTE — Progress Notes (Signed)
Cardiac Individual Treatment Plan  Patient Details  Name: Daniel Austin MRN: [id number] Date of Birth: Feb 18, 1941 Referring Provider:  Einar Pheasant, MD  Initial Encounter Date:    Visit Diagnosis: No diagnosis found.  Patient's Home Medications on Admission:  Current outpatient prescriptions:  .  Aspirin 81 MG EC tablet, Take 81 mg by mouth daily., Disp: , Rfl:  .  citalopram (CELEXA) 20 MG tablet, TAKE 1 TABLET (20 MG TOTAL) BY MOUTH DAILY., Disp: 90 tablet, Rfl: 0 .  clopidogrel (PLAVIX) 75 MG tablet, TAKE 1 TABLET (75 MG TOTAL) BY MOUTH DAILY., Disp: 30 tablet, Rfl: 5 .  CRESTOR 20 MG tablet, Take 20 mg by mouth daily., Disp: , Rfl: 11 .  fexofenadine (ALLEGRA) 180 MG tablet, Take 180 mg by mouth daily., Disp: , Rfl:  .  levothyroxine (SYNTHROID, LEVOTHROID) 112 MCG tablet, TAKE 1 TABLET (112 MCG TOTAL) BY MOUTH DAILY., Disp: 30 tablet, Rfl: 5 .  lisinopril (PRINIVIL,ZESTRIL) 40 MG tablet, TAKE 1 TABLET (40 MG TOTAL) BY MOUTH DAILY., Disp: 30 tablet, Rfl: 5 .  LORazepam (ATIVAN) 1 MG tablet, TAKE 1 TABLET BY MOUTH AT BEDTIME AS NEEDED FOR ANXIETY, Disp: 30 tablet, Rfl: 1 .  metoprolol tartrate (LOPRESSOR) 25 MG tablet, Take 1 tablet (25 mg total) by mouth 2 (two) times daily., Disp: 180 tablet, Rfl: 3 .  naproxen sodium (ANAPROX) 220 MG tablet, Take 220 mg by mouth as needed (knee pain)., Disp: , Rfl:  .  nitroGLYCERIN (NITROSTAT) 0.4 MG SL tablet, Place 0.4 mg under the tongue every 5 (five) minutes as needed. If a third tablet is needed please call 911., Disp: , Rfl:  .  pantoprazole (PROTONIX) 40 MG tablet, TAKE 1 TABLET (40 MG TOTAL) BY MOUTH DAILY., Disp: 30 tablet, Rfl: 5  Past Medical History: Past Medical History  Diagnosis Date  . Hypercholesterolemia   . Hypothyroidism   . Hypertension   . Fractures, multiple     Metal rod in left foot  . CAD (coronary artery disease)   . Carotid artery stenosis     high-grade symptomatic right sided s/p stent twice  . Syncope      second degree heart block s/p [employer] pacemaker placement 12/08  . Anemia   . Ulcer of gastric fundus   . Hyperglycemia     Tobacco Use: History  Smoking status  . Never Smoker   Smokeless tobacco  . Never Used    Labs: Recent Review Flowsheet Data    Labs for ITP Cardiac and Pulmonary Rehab Latest Ref Rng 09/02/2013 12/31/2013 05/25/2014 09/28/2014 01/07/2015   Cholestrol 0 - 200 mg/dL 159 154 141 159 130   LDLCALC 0 - 99 mg/dL 91 96 80 87 63   HDL >39.00 mg/dL 49.60 43.40 44.40 44.90 44.40   Trlycerides 0.0 - 149.0 mg/dL 93.0 71.0 81.0 134.0 115.0   Hemoglobin A1c 4.6 - 6.5 % 6.0 6.1 6.0 6.0 5.9       Exercise Target Goals:    Exercise Program Goal: Individual exercise prescription set with THRR, safety & activity barriers. Participant demonstrates ability to understand and report RPE using BORG scale, to self-measure pulse accurately, and to acknowledge the importance of the exercise prescription.  Exercise Prescription Goal: Starting with aerobic activity 30 plus minutes a day, 3 days per week for initial exercise prescription. Provide home exercise prescription and guidelines that participant acknowledges understanding prior to discharge.  Activity Barriers & Risk Stratification:     Activity Barriers & Risk Stratification -  02/13/15 1124    Activity Barriers & Risk Stratification   Activity Barriers Arthritis;Joint Problems;Assistive Device;Other (comment)   Comments 2 canes and knee braces for arthritis in both knees   Risk Stratification High      6 Minute Walk:     6 Minute Walk      01/11/15 1125 03/16/15 0906     6 Minute Walk   Phase Initial Discharge    Distance 418 feet 855 feet    Walk Time 6 minutes 6 minutes    Resting HR 86 bpm 63 bpm    Resting BP 148/94 mmHg 110/80 mmHg    Max Ex. HR 86 bpm 89 bpm    Max Ex. BP 150/100 mmHg 130/70 mmHg    RPE 9 12    Perceived Dyspnea   2    Symptoms Yes (comment) No    Comments NO symptoms   used  NUSTEP        Initial Exercise Prescription:   Exercise Prescription Changes:     Exercise Prescription Changes      02/02/15 0800 02/09/15 0800 02/15/15 0700 03/02/15 0800 03/09/15 0800   Exercise Review   Progression Yes Yes Yes Yes Yes   Response to Exercise   Blood Pressure (Admit)   140/84 mmHg  140/84 mmHg   Blood Pressure (Exercise)   126/72 mmHg  126/72 mmHg   Blood Pressure (Exit)   114/80 mmHg  114/80 mmHg   Heart Rate (Admit)   77 bpm  77 bpm   Heart Rate (Exercise)   83 bpm  83 bpm   Heart Rate (Exit)   71 bpm  71 bpm   Rating of Perceived Exertion (Exercise)   12  12   Frequency   Add 1 additional day to program exercise sessions.  Add 1 additional day to program exercise sessions.   Duration   Progress to 50 minutes of aerobic without signs/symptoms of physical distress  Progress to 50 minutes of aerobic without signs/symptoms of physical distress   Intensity   THRR unchanged  THRR unchanged   Progression   Continue progressive overload as per policy without signs/symptoms or physical distress.  Continue progressive overload as per policy without signs/symptoms or physical distress.   Resistance Training   Training Prescription   Yes  Yes   Weight   3  3   Reps   10-12  10-12   Interval Training   Interval Training   Yes  Yes   Equipment   NuStep;Recumbant Bike  NuStep;Recumbant Bike   Recumbant Bike   Level '6  6 7 6   ' RPM 70  70 65 70   Minutes   25  25   NuStep   Level '5  6  8   ' Watts 70  70  115   Minutes   20  20     03/15/15 0700 03/23/15 0800         Exercise Review   Progression Yes Yes      Response to Exercise   Blood Pressure (Admit) 102/70 mmHg 102/70 mmHg      Blood Pressure (Exercise) 132/64 mmHg 132/64 mmHg      Blood Pressure (Exit) 110/70 mmHg 110/70 mmHg      Heart Rate (Admit) 74 bpm 74 bpm      Heart Rate (Exercise) 96 bpm 96 bpm      Heart Rate (Exit) 79 bpm 79 bpm      Rating of Perceived  Exertion (Exercise) 13 13       Frequency --       Duration Progress to 50 minutes of aerobic without signs/symptoms of physical distress Progress to 50 minutes of aerobic without signs/symptoms of physical distress      Intensity THRR unchanged THRR unchanged      Progression Continue progressive overload as per policy without signs/symptoms or physical distress. Continue progressive overload as per policy without signs/symptoms or physical distress.      Resistance Training   Training Prescription Yes Yes      Weight 3 3      Reps 10-15 10-15      Interval Training   Interval Training Yes Yes      Equipment NuStep;Recumbant Bike NuStep;Recumbant Bike      Recumbant Bike   Level 12 13      RPM 50 45      Minutes 25 25      NuStep   Level 8 8      Watts 115 115      Minutes 20 20         Discharge Exercise Prescription (Final Exercise Prescription Changes):     Exercise Prescription Changes - 03/23/15 0800    Exercise Review   Progression Yes   Response to Exercise   Blood Pressure (Admit) 102/70 mmHg   Blood Pressure (Exercise) 132/64 mmHg   Blood Pressure (Exit) 110/70 mmHg   Heart Rate (Admit) 74 bpm   Heart Rate (Exercise) 96 bpm   Heart Rate (Exit) 79 bpm   Rating of Perceived Exertion (Exercise) 13   Duration Progress to 50 minutes of aerobic without signs/symptoms of physical distress   Intensity THRR unchanged   Progression Continue progressive overload as per policy without signs/symptoms or physical distress.   Resistance Training   Training Prescription Yes   Weight 3   Reps 10-15   Interval Training   Interval Training Yes   Equipment NuStep;Recumbant Bike   Recumbant Bike   Level 13   RPM 45   Minutes 25   NuStep   Level 8   Watts 115   Minutes 20      Nutrition:  Target Goals: Understanding of nutrition guidelines, daily intake of sodium <1540m, cholesterol <2023m calories 30% from fat and 7% or less from saturated fats, daily to have 5 or more servings of fruits and  vegetables.  Biometrics:     Pre Biometrics - 01/11/15 1127    Pre Biometrics   Height '5\' 8"'  (1.727 m)   Weight 198 lb 14.4 oz (90.22 kg)   Waist Circumference 41 inches   Hip Circumference 43 inches   Waist to Hip Ratio 0.95 %   BMI (Calculated) 30.3       Nutrition Therapy Plan and Nutrition Goals:     Nutrition Therapy & Goals - 03/15/15 1040    Personal Nutrition Goals   Personal Goal #1 KeYvone Neuoesn't feel he needs to meet with the dietician. KeYvone Neueports his wife cooks healthy. He said he cut out salt 10 years ago and uses pepper instead.       Nutrition Discharge: Rate Your Plate Scores:     Rate Your Plate - 0602/54/2700623  Rate Your Plate Scores   Post Score 69   Post Score % 74 %      Nutrition Goals Re-Evaluation:     Nutrition Goals Re-Evaluation      02/14/15 0926  Personal Goal #1 Re-Evaluation   Personal Goal #1 [name] doesn't feel he needs to meet with the dietician. [name] reports his wife cooks healthy. He said he cut out salt 10 years ago and uses pepper instead.        Goal Progress Seen Yes          Psychosocial: Target Goals: Acknowledge presence or absence of depression, maximize coping skills, provide positive support system. Participant is able to verbalize types and ability to use techniques and skills needed for reducing stress and depression.  Initial Review & Psychosocial Screening:   Quality of Life Scores:     Quality of Life - 03/11/15 1001    Quality of Life Scores   Health/Function Post 25 %   Socioeconomic Post 29.69 %   Psych/Spiritual Post 29.64 %   Family Post 30 %   GLOBAL Post 27.71 %      PHQ-9:     Recent Review Flowsheet Data    Depression screen [facility] 2/9 03/11/2015 01/07/2015 01/04/2014 09/10/2013 09/10/2013   Decreased Interest 0 3 0 0 0   Down, Depressed, Hopeless 0 1 0 1 2   PHQ - 2 Score 0 4 0 1 2   Altered sleeping 0 3 - 0 -   Tired, decreased energy 0 3 - 2 -   Change in appetite 0 2 - 0 -    Feeling bad or failure about yourself  0 3 - 0 -   Trouble concentrating 0 1 - 0 -   Moving slowly or fidgety/restless 0 0 - 0 -   Suicidal thoughts 0 0 - 0 -   PHQ-9 Score 0 16 - 3 -   Difficult doing work/chores Not difficult at all Somewhat difficult - - -      Psychosocial Evaluation and Intervention:     Psychosocial Evaluation - 02/07/15 0959    Psychosocial Evaluation & Interventions   Interventions Stress management education;Relaxation education;Encouraged to exercise with the program and follow exercise prescription   Comments Counselor  met today with Mr. Choi for initial psychosocial evaluation.  He is a 74 year old hard-working gentleman who has had heart problems since 2008.  He has a strong support system with a spouse of 64 years, an adult daughter who lives close by and is actively involved in his faith community.  Mr. Julia was walking with (2) knee braces and the use of (2) canes and reports needing "knee Replacement" when he completes this program.  He states he has a history of depression and anxiety symptoms and has been on medications since 2008, which he reports work well for him currently with no current symptoms.  His PHQ-9 was "52" and counselor reviewed that with Mr. Knighton reporting since coming into the program several weeks ago, he is already feeling less hopeless and less tired or bad about himself.  He also states he is seeing progress in that he is moving better, knees are stronger, breathing better and generally has more energy.  His goal for this program are to continue this progress and to return to more normal household activities.  He is a very determined individual, so I predict he will work hard and accomplish these goals, in order to be strong enough to have the knee replacement surgeries.  Counselor will continue to follow as needed.     Continued Psychosocial Services Needed Yes  Mr. Benally will benefit from all of the psychoeducational components of  this program as he  needs to learn how to manage stress better and the benefit of relaxation.        Psychosocial Re-Evaluation:   Vocational Rehabilitation: Provide vocational rehab assistance to qualifying candidates.   Vocational Rehab Evaluation & Intervention:     Vocational Rehab - 02/13/15 1125    Initial Vocational Rehab Evaluation & Intervention   Assessment shows need for Vocational Rehabilitation No      Education: Education Goals: Education classes will be provided on a weekly basis, covering required topics. Participant will state understanding/return demonstration of topics presented.  Learning Barriers/Preferences:     Learning Barriers/Preferences - 02/13/15 1124    Learning Barriers/Preferences   Learning Barriers Exercise Concerns   Learning Preferences None      Education Topics: General Nutrition Guidelines/Fats and Fiber: -Group instruction provided by verbal, written material, models and posters to present the general guidelines for heart healthy nutrition. Gives an explanation and review of dietary fats and fiber.   Controlling Sodium/Reading Food Labels: -Group verbal and written material supporting the discussion of sodium use in heart healthy nutrition. Review and explanation with models, verbal and written materials for utilization of the food label.   Exercise Physiology & Risk Factors: - Group verbal and written instruction with models to review the exercise physiology of the cardiovascular system and associated critical values. Details cardiovascular disease risk factors and the goals associated with each risk factor.   Aerobic Exercise & Resistance Training: - Gives group verbal and written discussion on the health impact of inactivity. On the components of aerobic and resistive training programs and the benefits of this training and how to safely progress through these programs.   Flexibility, Balance, General Exercise Guidelines: -  Provides group verbal and written instruction on the benefits of flexibility and balance training programs. Provides general exercise guidelines with specific guidelines to those with heart or lung disease. Demonstration and skill practice provided.   Stress Management: - Provides group verbal and written instruction about the health risks of elevated stress, cause of high stress, and healthy ways to reduce stress.   Depression: - Provides group verbal and written instruction on the correlation between heart/lung disease and depressed mood, treatment options, and the stigmas associated with seeking treatment.   Anatomy & Physiology of the Heart: - Group verbal and written instruction and models provide basic cardiac anatomy and physiology, with the coronary electrical and arterial systems. Review of: AMI, Angina, Valve disease, Heart Failure, Cardiac Arrhythmia, Pacemakers, and the ICD.   Cardiac Procedures: - Group verbal and written instruction and models to describe the testing methods done to diagnose heart disease. Reviews the outcomes of the test results. Describes the treatment choices: Medical Management, Angioplasty, or Coronary Bypass Surgery.   Cardiac Medications: - Group verbal and written instruction to review commonly prescribed medications for heart disease. Reviews the medication, class of the drug, and side effects. Includes the steps to properly store meds and maintain the prescription regimen.   Go Sex-Intimacy & Heart Disease, Get SMART - Goal Setting: - Group verbal and written instruction through game format to discuss heart disease and the return to sexual intimacy. Provides group verbal and written material to discuss and apply goal setting through the application of the S.M.A.R.T. Method.   Other Matters of the Heart: - Provides group verbal, written materials and models to describe Heart Failure, Angina, Valve Disease, and Diabetes in the realm of heart disease.  Includes description of the disease process and treatment options available to the cardiac  patient.   Exercise & Equipment Safety: - Individual verbal instruction and demonstration of equipment use and safety with use of the equipment.   Infection Prevention: - Provides verbal and written material to individual with discussion of infection control including proper hand washing and proper equipment cleaning during exercise session.   Falls Prevention: - Provides verbal and written material to individual with discussion of falls prevention and safety.   Diabetes: - Individual verbal and written instruction to review signs/symptoms of diabetes, desired ranges of glucose level fasting, after meals and with exercise. Advice that pre and post exercise glucose checks will be done for 3 sessions at entry of program.    Knowledge Questionnaire Score:     Knowledge Questionnaire Score - 03/15/15 1039    Knowledge Questionnaire Score   Pre Score 17/28   Post Score 27      Personal Goals and Risk Factors at Admission:     Personal Goals and Risk Factors at Admission - 02/13/15 1127    Personal Goals and Risk Factors on Admission   Increase Aerobic Exercise and Physical Activity Yes   Intervention While in program, learn and follow the exercise prescription taught. Start at a low level workload and increase workload after able to maintain previous level for 30 minutes. Increase time before increasing intensity.   Understand more about Heart/Pulmonary Disease. Yes   Intervention While in program utilize professionals for any questions, and attend the education sessions. Great websites to use are www.americanheart.org or www.lung.org for reliable information.   Diabetes No   Hypertension Yes   Goal Participant will see blood pressure controlled within the values of 140/52m/Hg or within value directed by their physician.   Intervention Provide nutrition & aerobic exercise along with  prescribed medications to achieve BP 140/90 or less.   Lipids Yes   Goal Cholesterol controlled with medications as prescribed, with individualized exercise RX and with personalized nutrition plan. Value goals: LDL < 732m HDL > 4074mParticipant states understanding of desired cholesterol values and following prescriptions.   Intervention Provide nutrition & aerobic exercise along with prescribed medications to achieve LDL <70m48mDL >40mg21m   Personal Goals and Risk Factors Review:      Goals and Risk Factor Review      02/14/15 0928 02/25/15 1250         Increase Aerobic Exercise and Physical Activity   Goals Progress/Improvement seen  Yes Yes      Comments Is exercising on the Recumbent bike level 5! Ken rYvone Neurts he feels stronger and feels Cardiac Rehab has helped him.  Improving in level and watts on the NS and RB, really enjoying the benefits of exercise and the stamina he has. He feels stronger and more comfortable and stable walking in public than before rehab      Understand more about Heart/Pulmonary Disease   Goals Progress/Improvement seen  Yes       Other Goal   Goals Progress/Improvement seen   Yes      Comments  His stability with walking is improving. He has to rely less on his canes and can move more freely and confidently than before rehab. This is the biggest difference he has noticed.          Personal Goals Discharge:      Personal Goals at Discharge - 03/15/15 1041    Increase Aerobic Exercise and Physical Activity   Goals Progress/Improvement seen  Yes   Comments Improving in level and  watts on the NS and RB, really enjoying the benefits of exercise and the stamina he has. He feels stronger and more comfortable and stable walking in public than before rehab   Understand more about Heart/Pulmonary Disease   Goals Progress/Improvement seen  Yes   Other Goal   Goals Progress/Improvement seen  Yes   Comments His stability with walking is improving. He has to  rely less on his canes and can move more freely and confidently than before rehab. This is the biggest difference he has noticed.        Comments: Ready for discharge

## 2015-03-30 ENCOUNTER — Ambulatory Visit: Payer: PRIVATE HEALTH INSURANCE

## 2015-03-30 ENCOUNTER — Encounter: Payer: Self-pay | Admitting: *Deleted

## 2015-03-30 DIAGNOSIS — Z9861 Coronary angioplasty status: Secondary | ICD-10-CM

## 2015-03-30 NOTE — Progress Notes (Signed)
Discharge Summary  Patient Details  Name: EWARD RUTIGLIANO MRN: [id number] Date of Birth: November 07, 1940 Referring Provider:  No ref. provider found   Number of Visits: 36  Reason for Discharge:  Patient reached a stable level of exercise. Patient independent in their exercise.  Smoking History:  History  Smoking status  . Never Smoker   Smokeless tobacco  . Never Used    Diagnosis:  No diagnosis found.  ADL UCSD:   Initial Exercise Prescription:   Discharge Exercise Prescription (Final Exercise Prescription Changes):     Exercise Prescription Changes - 03/23/15 0800    Exercise Review   Progression Yes   Response to Exercise   Blood Pressure (Admit) 102/70 mmHg   Blood Pressure (Exercise) 132/64 mmHg   Blood Pressure (Exit) 110/70 mmHg   Heart Rate (Admit) 74 bpm   Heart Rate (Exercise) 96 bpm   Heart Rate (Exit) 79 bpm   Rating of Perceived Exertion (Exercise) 13   Duration Progress to 50 minutes of aerobic without signs/symptoms of physical distress   Intensity THRR unchanged   Progression Continue progressive overload as per policy without signs/symptoms or physical distress.   Resistance Training   Training Prescription Yes   Weight 3   Reps 10-15   Interval Training   Interval Training Yes   Equipment NuStep;Recumbant Bike   Recumbant Bike   Level 13   RPM 45   Minutes 25   NuStep   Level 8   Watts 115   Minutes 20      Functional Capacity:     6 Minute Walk      01/11/15 1125 03/16/15 0906     6 Minute Walk   Phase Initial Discharge    Distance 418 feet 855 feet    Walk Time 6 minutes 6 minutes    Resting HR 86 bpm 63 bpm    Resting BP 148/94 mmHg 110/80 mmHg    Max Ex. HR 86 bpm 89 bpm    Max Ex. BP 150/100 mmHg 130/70 mmHg    RPE 9 12    Perceived Dyspnea   2    Symptoms Yes (comment) No    Comments NO symptoms   used NUSTEP        Psychological, QOL, Others - Outcomes: PHQ 2/9: Depression screen [facility] 2/9 03/11/2015 01/07/2015  01/04/2014 09/10/2013 09/10/2013  Decreased Interest 0 3 0 0 0  Down, Depressed, Hopeless 0 1 0 1 2  PHQ - 2 Score 0 4 0 1 2  Altered sleeping 0 3 - 0 -  Tired, decreased energy 0 3 - 2 -  Change in appetite 0 2 - 0 -  Feeling bad or failure about yourself  0 3 - 0 -  Trouble concentrating 0 1 - 0 -  Moving slowly or fidgety/restless 0 0 - 0 -  Suicidal thoughts 0 0 - 0 -  PHQ-9 Score 0 16 - 3 -  Difficult doing work/chores Not difficult at all Somewhat difficult - - -    Quality of Life:     Quality of Life - 03/11/15 1001    Quality of Life Scores   Health/Function Post 25 %   Socioeconomic Post 29.69 %   Psych/Spiritual Post 29.64 %   Family Post 30 %   GLOBAL Post 27.71 %      Personal Goals: Goals established at orientation with interventions provided to work toward goal.     Personal Goals and Risk Factors at Admission -  02/13/15 1127    Personal Goals and Risk Factors on Admission   Increase Aerobic Exercise and Physical Activity Yes   Intervention While in program, learn and follow the exercise prescription taught. Start at a low level workload and increase workload after able to maintain previous level for 30 minutes. Increase time before increasing intensity.   Understand more about Heart/Pulmonary Disease. Yes   Intervention While in program utilize professionals for any questions, and attend the education sessions. Great websites to use are www.americanheart.org or www.lung.org for reliable information.   Diabetes No   Hypertension Yes   Goal Participant will see blood pressure controlled within the values of 140/57mm/Hg or within value directed by their physician.   Intervention Provide nutrition & aerobic exercise along with prescribed medications to achieve BP 140/90 or less.   Lipids Yes   Goal Cholesterol controlled with medications as prescribed, with individualized exercise RX and with personalized nutrition plan. Value goals: LDL < 70mg , HDL > 40mg .  Participant states understanding of desired cholesterol values and following prescriptions.   Intervention Provide nutrition & aerobic exercise along with prescribed medications to achieve LDL 70mg , HDL >40mg .       Personal Goals Discharge:     Personal Goals at Discharge - 03/15/15 1041    Increase Aerobic Exercise and Physical Activity   Goals Progress/Improvement seen  Yes   Comments Improving in level and watts on the NS and RB, really enjoying the benefits of exercise and the stamina he has. He feels stronger and more comfortable and stable walking in public than before rehab   Understand more about Heart/Pulmonary Disease   Goals Progress/Improvement seen  Yes   Other Goal   Goals Progress/Improvement seen  Yes   Comments His stability with walking is improving. He has to rely less on his canes and can move more freely and confidently than before rehab. This is the biggest difference he has noticed.       Nutrition & Weight - Outcomes:     Pre Biometrics - 01/11/15 1127    Pre Biometrics   Height 5\' 8"  (1.727 m)   Weight 198 lb 14.4 oz (90.22 kg)   Waist Circumference 41 inches   Hip Circumference 43 inches   Waist to Hip Ratio 0.95 %   BMI (Calculated) 30.3       Nutrition:     Nutrition Therapy & Goals - 03/15/15 1040    Personal Nutrition Goals   Personal Goal #1 [name] doesn't feel he needs to meet with the dietician. [name] reports his wife cooks healthy. He said he cut out salt 10 years ago and uses pepper instead.       Nutrition Discharge:     Rate Your Plate - 33/00/76 2263    Rate Your Plate Scores   Post Score 69   Post Score % 74 %      Education Questionnaire Score:     Knowledge Questionnaire Score - 03/15/15 1039    Knowledge Questionnaire Score   Pre Score 17/28   Post Score 27      Goals reviewed with patient; copy given to patient.

## 2015-03-30 NOTE — Progress Notes (Signed)
Cardiac Individual Treatment Plan  Patient Details  Name: Daniel Austin MRN: [id number] Date of Birth: 1941-06-07 Referring Provider:  Dr. Josefa Half  Initial Encounter Date:   01/11/2015 Visit Diagnosis: Stent LAD Patient's Home Medications on Admission:  Current outpatient prescriptions:  .  Aspirin 81 MG EC tablet, Take 81 mg by mouth daily., Disp: , Rfl:  .  citalopram (CELEXA) 20 MG tablet, TAKE 1 TABLET (20 MG TOTAL) BY MOUTH DAILY., Disp: 90 tablet, Rfl: 0 .  clopidogrel (PLAVIX) 75 MG tablet, TAKE 1 TABLET (75 MG TOTAL) BY MOUTH DAILY., Disp: 30 tablet, Rfl: 5 .  CRESTOR 20 MG tablet, Take 20 mg by mouth daily., Disp: , Rfl: 11 .  fexofenadine (ALLEGRA) 180 MG tablet, Take 180 mg by mouth daily., Disp: , Rfl:  .  levothyroxine (SYNTHROID, LEVOTHROID) 112 MCG tablet, TAKE 1 TABLET (112 MCG TOTAL) BY MOUTH DAILY., Disp: 30 tablet, Rfl: 5 .  lisinopril (PRINIVIL,ZESTRIL) 40 MG tablet, TAKE 1 TABLET (40 MG TOTAL) BY MOUTH DAILY., Disp: 30 tablet, Rfl: 5 .  LORazepam (ATIVAN) 1 MG tablet, TAKE 1 TABLET BY MOUTH AT BEDTIME AS NEEDED FOR ANXIETY, Disp: 30 tablet, Rfl: 1 .  metoprolol tartrate (LOPRESSOR) 25 MG tablet, Take 1 tablet (25 mg total) by mouth 2 (two) times daily., Disp: 180 tablet, Rfl: 3 .  naproxen sodium (ANAPROX) 220 MG tablet, Take 220 mg by mouth as needed (knee pain)., Disp: , Rfl:  .  nitroGLYCERIN (NITROSTAT) 0.4 MG SL tablet, Place 0.4 mg under the tongue every 5 (five) minutes as needed. If a third tablet is needed please call 911., Disp: , Rfl:  .  pantoprazole (PROTONIX) 40 MG tablet, TAKE 1 TABLET (40 MG TOTAL) BY MOUTH DAILY., Disp: 30 tablet, Rfl: 5  Past Medical History: Past Medical History  Diagnosis Date  . Hypercholesterolemia   . Hypothyroidism   . Hypertension   . Fractures, multiple     Metal rod in left foot  . CAD (coronary artery disease)   . Carotid artery stenosis     high-grade symptomatic right sided s/p stent twice  . Syncope     second  degree heart block s/p St Jude's pacemaker placement 12/08  . Anemia   . Ulcer of gastric fundus   . Hyperglycemia     Tobacco Use: History  Smoking status  . Never Smoker   Smokeless tobacco  . Never Used    Labs: Recent Review Flowsheet Data    Labs for ITP Cardiac and Pulmonary Rehab Latest Ref Rng 09/02/2013 12/31/2013 05/25/2014 09/28/2014 01/07/2015   Cholestrol 0 - 200 mg/dL 159 154 141 159 130   LDLCALC 0 - 99 mg/dL 91 96 80 87 63   HDL >39.00 mg/dL 49.60 43.40 44.40 44.90 44.40   Trlycerides 0.0 - 149.0 mg/dL 93.0 71.0 81.0 134.0 115.0   Hemoglobin A1c 4.6 - 6.5 % 6.0 6.1 6.0 6.0 5.9       Exercise Target Goals:    Exercise Program Goal: Individual exercise prescription set with THRR, safety & activity barriers. Participant demonstrates ability to understand and report RPE using BORG scale, to self-measure pulse accurately, and to acknowledge the importance of the exercise prescription.  Exercise Prescription Goal: Starting with aerobic activity 30 plus minutes a day, 3 days per week for initial exercise prescription. Provide home exercise prescription and guidelines that participant acknowledges understanding prior to discharge.  Activity Barriers & Risk Stratification:     Activity Barriers & Risk Stratification - 02/13/15 1124  Activity Barriers & Risk Stratification   Activity Barriers Arthritis;Joint Problems;Assistive Device;Other (comment)   Comments 2 canes and knee braces for arthritis in both knees   Risk Stratification High      6 Minute Walk:     6 Minute Walk      01/11/15 1125 03/16/15 0906     6 Minute Walk   Phase Initial Discharge    Distance 418 feet 855 feet    Walk Time 6 minutes 6 minutes    Resting HR 86 bpm 63 bpm    Resting BP 148/94 mmHg 110/80 mmHg    Max Ex. HR 86 bpm 89 bpm    Max Ex. BP 150/100 mmHg 130/70 mmHg    RPE 9 12    Perceived Dyspnea   2    Symptoms Yes (comment) No    Comments NO symptoms   used NUSTEP         Initial Exercise Prescription:   Exercise Prescription Changes:     Exercise Prescription Changes      02/02/15 0800 02/09/15 0800 02/15/15 0700 03/02/15 0800 03/09/15 0800   Exercise Review   Progression Yes Yes Yes Yes Yes   Response to Exercise   Blood Pressure (Admit)   140/84 mmHg  140/84 mmHg   Blood Pressure (Exercise)   126/72 mmHg  126/72 mmHg   Blood Pressure (Exit)   114/80 mmHg  114/80 mmHg   Heart Rate (Admit)   77 bpm  77 bpm   Heart Rate (Exercise)   83 bpm  83 bpm   Heart Rate (Exit)   71 bpm  71 bpm   Rating of Perceived Exertion (Exercise)   12  12   Frequency   Add 1 additional day to program exercise sessions.  Add 1 additional day to program exercise sessions.   Duration   Progress to 50 minutes of aerobic without signs/symptoms of physical distress  Progress to 50 minutes of aerobic without signs/symptoms of physical distress   Intensity   THRR unchanged  THRR unchanged   Progression   Continue progressive overload as per policy without signs/symptoms or physical distress.  Continue progressive overload as per policy without signs/symptoms or physical distress.   Resistance Training   Training Prescription   Yes  Yes   Weight   3  3   Reps   10-12  10-12   Interval Training   Interval Training   Yes  Yes   Equipment   NuStep;Recumbant Bike  NuStep;Recumbant Bike   Recumbant Bike   Level '6  6 7 6   ' RPM 70  70 65 70   Minutes   25  25   NuStep   Level '5  6  8   ' Watts 70  70  115   Minutes   20  20     03/15/15 0700 03/23/15 0800         Exercise Review   Progression Yes Yes      Response to Exercise   Blood Pressure (Admit) 102/70 mmHg 102/70 mmHg      Blood Pressure (Exercise) 132/64 mmHg 132/64 mmHg      Blood Pressure (Exit) 110/70 mmHg 110/70 mmHg      Heart Rate (Admit) 74 bpm 74 bpm      Heart Rate (Exercise) 96 bpm 96 bpm      Heart Rate (Exit) 79 bpm 79 bpm      Rating of Perceived Exertion (Exercise) 13 13  Frequency --        Duration Progress to 50 minutes of aerobic without signs/symptoms of physical distress Progress to 50 minutes of aerobic without signs/symptoms of physical distress      Intensity THRR unchanged THRR unchanged      Progression Continue progressive overload as per policy without signs/symptoms or physical distress. Continue progressive overload as per policy without signs/symptoms or physical distress.      Resistance Training   Training Prescription Yes Yes      Weight 3 3      Reps 10-15 10-15      Interval Training   Interval Training Yes Yes      Equipment NuStep;Recumbant Bike NuStep;Recumbant Bike      Recumbant Bike   Level 12 13      RPM 50 45      Minutes 25 25      NuStep   Level 8 8      Watts 115 115      Minutes 20 20         Discharge Exercise Prescription (Final Exercise Prescription Changes):     Exercise Prescription Changes - 03/23/15 0800    Exercise Review   Progression Yes   Response to Exercise   Blood Pressure (Admit) 102/70 mmHg   Blood Pressure (Exercise) 132/64 mmHg   Blood Pressure (Exit) 110/70 mmHg   Heart Rate (Admit) 74 bpm   Heart Rate (Exercise) 96 bpm   Heart Rate (Exit) 79 bpm   Rating of Perceived Exertion (Exercise) 13   Duration Progress to 50 minutes of aerobic without signs/symptoms of physical distress   Intensity THRR unchanged   Progression Continue progressive overload as per policy without signs/symptoms or physical distress.   Resistance Training   Training Prescription Yes   Weight 3   Reps 10-15   Interval Training   Interval Training Yes   Equipment NuStep;Recumbant Bike   Recumbant Bike   Level 13   RPM 45   Minutes 25   NuStep   Level 8   Watts 115   Minutes 20      Nutrition:  Target Goals: Understanding of nutrition guidelines, daily intake of sodium <1577m, cholesterol <2068m calories 30% from fat and 7% or less from saturated fats, daily to have 5 or more servings of fruits and  vegetables.  Biometrics:     Pre Biometrics - 01/11/15 1127    Pre Biometrics   Height '5\' 8"'  (1.727 m)   Weight 198 lb 14.4 oz (90.22 kg)   Waist Circumference 41 inches   Hip Circumference 43 inches   Waist to Hip Ratio 0.95 %   BMI (Calculated) 30.3       Nutrition Therapy Plan and Nutrition Goals:     Nutrition Therapy & Goals - 03/15/15 1040    Personal Nutrition Goals   Personal Goal #1 KeYvone Neuoesn't feel he needs to meet with the dietician. KeYvone Neueports his wife cooks healthy. He said he cut out salt 10 years ago and uses pepper instead.       Nutrition Discharge: Rate Your Plate Scores:     Rate Your Plate - 0619/16/6006004  Rate Your Plate Scores   Post Score 69   Post Score % 74 %      Nutrition Goals Re-Evaluation:     Nutrition Goals Re-Evaluation      02/14/15 0926           Personal Goal #1  Re-Evaluation   Personal Goal #1 [name] doesn't feel he needs to meet with the dietician. [name] reports his wife cooks healthy. He said he cut out salt 10 years ago and uses pepper instead.        Goal Progress Seen Yes          Psychosocial: Target Goals: Acknowledge presence or absence of depression, maximize coping skills, provide positive support system. Participant is able to verbalize types and ability to use techniques and skills needed for reducing stress and depression.  Initial Review & Psychosocial Screening:   Quality of Life Scores:     Quality of Life - 03/11/15 1001    Quality of Life Scores   Health/Function Post 25 %   Socioeconomic Post 29.69 %   Psych/Spiritual Post 29.64 %   Family Post 30 %   GLOBAL Post 27.71 %      PHQ-9:     Recent Review Flowsheet Data    Depression screen [facility] 2/9 03/11/2015 01/07/2015 01/04/2014 09/10/2013 09/10/2013   Decreased Interest 0 3 0 0 0   Down, Depressed, Hopeless 0 1 0 1 2   PHQ - 2 Score 0 4 0 1 2   Altered sleeping 0 3 - 0 -   Tired, decreased energy 0 3 - 2 -   Change in appetite 0 2 - 0 -    Feeling bad or failure about yourself  0 3 - 0 -   Trouble concentrating 0 1 - 0 -   Moving slowly or fidgety/restless 0 0 - 0 -   Suicidal thoughts 0 0 - 0 -   PHQ-9 Score 0 16 - 3 -   Difficult doing work/chores Not difficult at all Somewhat difficult - - -      Psychosocial Evaluation and Intervention:     Psychosocial Evaluation - 02/07/15 0959    Psychosocial Evaluation & Interventions   Interventions Stress management education;Relaxation education;Encouraged to exercise with the program and follow exercise prescription   Comments Counselor  met today with Mr. Retz for initial psychosocial evaluation.  He is a 74 year old hard-working gentleman who has had heart problems since 2008.  He has a strong support system with a spouse of 17 years, an adult daughter who lives close by and is actively involved in his faith community.  Mr. Disano was walking with (2) knee braces and the use of (2) canes and reports needing "knee Replacement" when he completes this program.  He states he has a history of depression and anxiety symptoms and has been on medications since 2008, which he reports work well for him currently with no current symptoms.  His PHQ-9 was "60" and counselor reviewed that with Mr. Iseman reporting since coming into the program several weeks ago, he is already feeling less hopeless and less tired or bad about himself.  He also states he is seeing progress in that he is moving better, knees are stronger, breathing better and generally has more energy.  His goal for this program are to continue this progress and to return to more normal household activities.  He is a very determined individual, so I predict he will work hard and accomplish these goals, in order to be strong enough to have the knee replacement surgeries.  Counselor will continue to follow as needed.     Continued Psychosocial Services Needed Yes  Mr. Rabinovich will benefit from all of the psychoeducational components of  this program as he needs to learn  how to manage stress better and the benefit of relaxation.        Psychosocial Re-Evaluation:   Vocational Rehabilitation: Provide vocational rehab assistance to qualifying candidates.   Vocational Rehab Evaluation & Intervention:     Vocational Rehab - 02/13/15 1125    Initial Vocational Rehab Evaluation & Intervention   Assessment shows need for Vocational Rehabilitation No      Education: Education Goals: Education classes will be provided on a weekly basis, covering required topics. Participant will state understanding/return demonstration of topics presented.  Learning Barriers/Preferences:     Learning Barriers/Preferences - 02/13/15 1124    Learning Barriers/Preferences   Learning Barriers Exercise Concerns   Learning Preferences None      Education Topics: General Nutrition Guidelines/Fats and Fiber: -Group instruction provided by verbal, written material, models and posters to present the general guidelines for heart healthy nutrition. Gives an explanation and review of dietary fats and fiber.   Controlling Sodium/Reading Food Labels: -Group verbal and written material supporting the discussion of sodium use in heart healthy nutrition. Review and explanation with models, verbal and written materials for utilization of the food label.   Exercise Physiology & Risk Factors: - Group verbal and written instruction with models to review the exercise physiology of the cardiovascular system and associated critical values. Details cardiovascular disease risk factors and the goals associated with each risk factor.   Aerobic Exercise & Resistance Training: - Gives group verbal and written discussion on the health impact of inactivity. On the components of aerobic and resistive training programs and the benefits of this training and how to safely progress through these programs.   Flexibility, Balance, General Exercise Guidelines: -  Provides group verbal and written instruction on the benefits of flexibility and balance training programs. Provides general exercise guidelines with specific guidelines to those with heart or lung disease. Demonstration and skill practice provided.   Stress Management: - Provides group verbal and written instruction about the health risks of elevated stress, cause of high stress, and healthy ways to reduce stress.   Depression: - Provides group verbal and written instruction on the correlation between heart/lung disease and depressed mood, treatment options, and the stigmas associated with seeking treatment.   Anatomy & Physiology of the Heart: - Group verbal and written instruction and models provide basic cardiac anatomy and physiology, with the coronary electrical and arterial systems. Review of: AMI, Angina, Valve disease, Heart Failure, Cardiac Arrhythmia, Pacemakers, and the ICD.   Cardiac Procedures: - Group verbal and written instruction and models to describe the testing methods done to diagnose heart disease. Reviews the outcomes of the test results. Describes the treatment choices: Medical Management, Angioplasty, or Coronary Bypass Surgery.   Cardiac Medications: - Group verbal and written instruction to review commonly prescribed medications for heart disease. Reviews the medication, class of the drug, and side effects. Includes the steps to properly store meds and maintain the prescription regimen.   Go Sex-Intimacy & Heart Disease, Get SMART - Goal Setting: - Group verbal and written instruction through game format to discuss heart disease and the return to sexual intimacy. Provides group verbal and written material to discuss and apply goal setting through the application of the S.M.A.R.T. Method.   Other Matters of the Heart: - Provides group verbal, written materials and models to describe Heart Failure, Angina, Valve Disease, and Diabetes in the realm of heart disease.  Includes description of the disease process and treatment options available to the cardiac patient.  Exercise & Equipment Safety: - Individual verbal instruction and demonstration of equipment use and safety with use of the equipment.   Infection Prevention: - Provides verbal and written material to individual with discussion of infection control including proper hand washing and proper equipment cleaning during exercise session.   Falls Prevention: - Provides verbal and written material to individual with discussion of falls prevention and safety.   Diabetes: - Individual verbal and written instruction to review signs/symptoms of diabetes, desired ranges of glucose level fasting, after meals and with exercise. Advice that pre and post exercise glucose checks will be done for 3 sessions at entry of program.    Knowledge Questionnaire Score:     Knowledge Questionnaire Score - 03/15/15 1039    Knowledge Questionnaire Score   Pre Score 17/28   Post Score 27      Personal Goals and Risk Factors at Admission:     Personal Goals and Risk Factors at Admission - 02/13/15 1127    Personal Goals and Risk Factors on Admission   Increase Aerobic Exercise and Physical Activity Yes   Intervention While in program, learn and follow the exercise prescription taught. Start at a low level workload and increase workload after able to maintain previous level for 30 minutes. Increase time before increasing intensity.   Understand more about Heart/Pulmonary Disease. Yes   Intervention While in program utilize professionals for any questions, and attend the education sessions. Great websites to use are www.americanheart.org or www.lung.org for reliable information.   Diabetes No   Hypertension Yes   Goal Participant will see blood pressure controlled within the values of 140/60m/Hg or within value directed by their physician.   Intervention Provide nutrition & aerobic exercise along with  prescribed medications to achieve BP 140/90 or less.   Lipids Yes   Goal Cholesterol controlled with medications as prescribed, with individualized exercise RX and with personalized nutrition plan. Value goals: LDL < 724m HDL > 4029mParticipant states understanding of desired cholesterol values and following prescriptions.   Intervention Provide nutrition & aerobic exercise along with prescribed medications to achieve LDL <41m72mDL >40mg82m   Personal Goals and Risk Factors Review:      Goals and Risk Factor Review      02/14/15 0928 02/25/15 1250         Increase Aerobic Exercise and Physical Activity   Goals Progress/Improvement seen  Yes Yes      Comments Is exercising on the Recumbent bike level 5! Ken rYvone Neurts he feels stronger and feels Cardiac Rehab has helped him.  Improving in level and watts on the NS and RB, really enjoying the benefits of exercise and the stamina he has. He feels stronger and more comfortable and stable walking in public than before rehab      Understand more about Heart/Pulmonary Disease   Goals Progress/Improvement seen  Yes       Other Goal   Goals Progress/Improvement seen   Yes      Comments  His stability with walking is improving. He has to rely less on his canes and can move more freely and confidently than before rehab. This is the biggest difference he has noticed.          Personal Goals Discharge:      Personal Goals at Discharge - 03/15/15 1041    Increase Aerobic Exercise and Physical Activity   Goals Progress/Improvement seen  Yes   Comments Improving in level and watts on the  NS and RB, really enjoying the benefits of exercise and the stamina he has. He feels stronger and more comfortable and stable walking in public than before rehab   Understand more about Heart/Pulmonary Disease   Goals Progress/Improvement seen  Yes   Other Goal   Goals Progress/Improvement seen  Yes   Comments His stability with walking is improving. He has to  rely less on his canes and can move more freely and confidently than before rehab. This is the biggest difference he has noticed.        Comments: Ready for discharge from  Cardiac Rehab.

## 2015-04-01 ENCOUNTER — Ambulatory Visit: Payer: PRIVATE HEALTH INSURANCE

## 2015-04-02 ENCOUNTER — Other Ambulatory Visit: Payer: Self-pay | Admitting: Internal Medicine

## 2015-04-04 ENCOUNTER — Ambulatory Visit: Payer: PRIVATE HEALTH INSURANCE

## 2015-04-04 NOTE — Telephone Encounter (Signed)
Last OV 6.15.16, last refill 4.18.16.  Please advise refill

## 2015-04-04 NOTE — Telephone Encounter (Signed)
Refilled citalopram #90 with no refills.

## 2015-04-06 ENCOUNTER — Ambulatory Visit: Payer: PRIVATE HEALTH INSURANCE

## 2015-04-08 ENCOUNTER — Ambulatory Visit: Payer: PRIVATE HEALTH INSURANCE

## 2015-04-11 ENCOUNTER — Ambulatory Visit: Payer: PRIVATE HEALTH INSURANCE

## 2015-04-12 DIAGNOSIS — I420 Dilated cardiomyopathy: Secondary | ICD-10-CM | POA: Diagnosis not present

## 2015-04-13 ENCOUNTER — Ambulatory Visit: Payer: PRIVATE HEALTH INSURANCE

## 2015-04-15 ENCOUNTER — Ambulatory Visit: Payer: PRIVATE HEALTH INSURANCE

## 2015-04-18 ENCOUNTER — Ambulatory Visit: Payer: PRIVATE HEALTH INSURANCE

## 2015-04-18 ENCOUNTER — Ambulatory Visit: Payer: Medicare Other | Attending: Cardiology | Admitting: Physical Therapy

## 2015-04-18 ENCOUNTER — Encounter: Payer: Self-pay | Admitting: Physical Therapy

## 2015-04-18 DIAGNOSIS — M25662 Stiffness of left knee, not elsewhere classified: Secondary | ICD-10-CM | POA: Diagnosis not present

## 2015-04-18 DIAGNOSIS — R262 Difficulty in walking, not elsewhere classified: Secondary | ICD-10-CM | POA: Diagnosis not present

## 2015-04-18 DIAGNOSIS — M25561 Pain in right knee: Secondary | ICD-10-CM | POA: Insufficient documentation

## 2015-04-18 DIAGNOSIS — M6281 Muscle weakness (generalized): Secondary | ICD-10-CM

## 2015-04-18 DIAGNOSIS — M25562 Pain in left knee: Secondary | ICD-10-CM | POA: Diagnosis not present

## 2015-04-18 DIAGNOSIS — M25661 Stiffness of right knee, not elsewhere classified: Secondary | ICD-10-CM | POA: Diagnosis not present

## 2015-04-18 NOTE — Therapy (Signed)
[facility] [address] [place], [place], [address] Phone: [phone]   Fax:  [phone]  Physical Therapy Evaluation  Patient Details  Name: Daniel Austin MRN: [id number] Date of Birth: 11/24/1940 Referring Provider:  Isaias Cowman, MD  Encounter Date: 04/18/2015      PT End of Session - 04/18/15 1346    Visit Number 1   Number of Visits 8   Date for PT Re-Evaluation 05/16/15   Authorization - Visit Number 1   Authorization - Number of Visits 10   PT Start Time [phone]   PT Stop Time 1048   PT Time Calculation (min) 57 min   Activity Tolerance Patient tolerated treatment well;Patient limited by pain   Behavior During Therapy [facility] for tasks assessed/performed      Past Medical History  Diagnosis Date  . Hypercholesterolemia   . Hypothyroidism   . Hypertension   . Fractures, multiple     Metal rod in left foot  . CAD (coronary artery disease)   . Carotid artery stenosis     high-grade symptomatic right sided s/p stent twice  . Syncope     second degree heart block s/p St Jude's pacemaker placement 12/08  . Anemia   . Ulcer of gastric fundus   . Hyperglycemia     Past Surgical History  Procedure Laterality Date  . Pacemaker insertion  08/2007  . Hernia repair      inguinal  . Percutaneous placement intravascular stent cervical carotid artery      twice, right carotid artery  . Heart stint  11/04/2014    [facility]    There were no vitals filed for this visit.  Visit Diagnosis:  Knee pain, bilateral  Difficulty walking  Joint stiffness of knee, left  Stiffness of knee joint, right  Muscle weakness      Subjective Assessment - 04/18/15 1001    Subjective Pt. reports persistent B knee pain with c/o 6/10 B knee pain and 8/10 at worst (stairs/ prolonged standing and walking).     Limitations Standing;Lifting;Walking   Diagnostic tests x-ray done.   Patient Stated Goals Discussed Silver  Sneakers ex. program.     Currently in Pain? Yes   Pain Score 6    Pain Location Knee   Pain Orientation Right;Left;Anterior       LEFS: 38 out of 80.  B LE strength grossly 4+/5 MMT except B hip flexion/ hip abd. 4/5 MMT.  L/R knee circumferential measurements: knee joint (43.5 cm/43.5 cm). 2" sup. (41 cm./41 cm),  Mid. Calf: (34 cm./34 cm.)- no swelling noted in L as compared to R.   L knee AROM: -7 to 127 deg.   R knee AROM: -5 to 106 deg.  L knee more varus than R and LLD noted.  L LE 1.5 cm shorter than R.  Significant B hamstring tightness at 45 deg.  No c/o back pain.  Moderate antalgic gait pattern with and without use of SPC     Problem List Patient Active Problem List   Diagnosis Date Noted  . Encounter for completion of form with patient 01/12/2015  . Left arm numbness 09/30/2014  . Change in vision 01/09/2014  . Knee pain, bilateral 01/09/2014  . Left knee pain 09/13/2013  . Hyperglycemia 11/05/2012  . Carotid artery stenosis 11/05/2012  . Hypothyroidism 10/31/2012  . Pure hypercholesterolemia 10/31/2012  . Dysphagia, unspecified(787.20) 10/31/2012  . Coronary atherosclerosis of native coronary artery 10/31/2012  .  Anemia 10/31/2012  . Essential hypertension, benign 10/31/2012   Pura Spice, PT, DPT # [id number]   04/18/2015, 1:58 PM  [facility] [address] [place], [place], [address] Phone: [phone]   Fax:  [phone]

## 2015-04-20 ENCOUNTER — Ambulatory Visit: Payer: PRIVATE HEALTH INSURANCE

## 2015-04-21 ENCOUNTER — Ambulatory Visit: Payer: Medicare Other | Admitting: Physical Therapy

## 2015-04-21 DIAGNOSIS — M25562 Pain in left knee: Principal | ICD-10-CM

## 2015-04-21 DIAGNOSIS — M25662 Stiffness of left knee, not elsewhere classified: Secondary | ICD-10-CM

## 2015-04-21 DIAGNOSIS — M25661 Stiffness of right knee, not elsewhere classified: Secondary | ICD-10-CM

## 2015-04-21 DIAGNOSIS — R262 Difficulty in walking, not elsewhere classified: Secondary | ICD-10-CM | POA: Diagnosis not present

## 2015-04-21 DIAGNOSIS — M6281 Muscle weakness (generalized): Secondary | ICD-10-CM | POA: Diagnosis not present

## 2015-04-21 DIAGNOSIS — M25561 Pain in right knee: Secondary | ICD-10-CM

## 2015-04-21 NOTE — Therapy (Signed)
[facility] [address] [place], [place], [address] Phone: [phone]   Fax:  [phone]  Physical Therapy Treatment  Patient Details  Name: Daniel Austin MRN: [id number] Date of Birth: [birth year] Referring Provider:  Isaias Cowman, MD  Encounter Date: 04/21/2015      PT End of Session - 04/21/15 0757    Visit Number 2   Number of Visits 8   Date for PT Re-Evaluation 05/16/15   Authorization - Visit Number 2   Authorization - Number of Visits 10   PT Start Time 0938   PT Stop Time 0814   PT Time Calculation (min) 50 min   Activity Tolerance No increased pain;Patient tolerated treatment well   Behavior During Therapy [facility] for tasks assessed/performed      Past Medical History  Diagnosis Date  . Hypercholesterolemia   . Hypothyroidism   . Hypertension   . Fractures, multiple     Metal rod in left foot  . CAD (coronary artery disease)   . Carotid artery stenosis     high-grade symptomatic right sided s/p stent twice  . Syncope     second degree heart block s/p St Jude's pacemaker placement 12/08  . Anemia   . Ulcer of gastric fundus   . Hyperglycemia     Past Surgical History  Procedure Laterality Date  . Pacemaker insertion  08/2007  . Hernia repair      inguinal  . Percutaneous placement intravascular stent cervical carotid artery      twice, right carotid artery  . Heart stint  11/04/2014    [facility]    There were no vitals filed for this visit.  Visit Diagnosis:  Knee pain, bilateral  Difficulty walking  Joint stiffness of knee, left  Stiffness of knee joint, right  Muscle weakness      Subjective Assessment - 04/21/15 0727    Subjective Pt. reports good compliance with HEP with slight difficulty with standing hamstring stretches.  Pt. reports 7/10 R/L knee pain.    Patient is accompained by: Family member   Limitations Standing;Lifting;Walking   Diagnostic tests x-ray done.   Patient Stated Goals Discussed Silver Sneakers ex. program.     Currently in Pain? Yes   Pain Score 7    Pain Location Knee   Pain Orientation Right;Left;Anterior        OBJECTIVE:  There.ex.:  Reviewed HEP.  Manual tx.:  Supine proximal tibia grade III mobs. 3x20 sec..  Supine R/L LE hamstring/ piriformis/ quad stretches 4x each with static holds.  Patellar mobs. (all planes)- 5x each ("feels good").  Supine SAQ with light manual resistance 10x2 each.  TG B knee flexion (tolerable knee flexion)- 30x.  Nustep L6 10 min.       Pt response for medical necessity:  Slight modification required with hamstring/ HEP.  R knee hypomobility but improved after manual tx.  Decrease pain to 5/10 after tx./ stretches.          PT Long Term Goals - 04/18/15 1352    PT LONG TERM GOAL #1   Title Pt. I with HEP to increase B LE muscle strength 1/2 muscle grade to improve pain-free mobility/ walking.    Time 4   Period Weeks   Status New   PT LONG TERM GOAL #2   Title Pt. will increase LEFS to >50 out of 80 to improve overall pain-free mobility.     Baseline 38 out of 80  on 7/25   Time 4   Period Weeks   Status New   PT LONG TERM GOAL #3   Title Pt. able to ambulate wiith improved recip. gait pattern and use of SPC with decrease c/o pain to improve mobility/ return to work.     Time 4   Period Weeks   Status New   PT LONG TERM GOAL #4   Title Pt. able to return to work at [employer] with no B knee limitations.     Time 4   Period Weeks   Status New      Problem List Patient Active Problem List   Diagnosis Date Noted  . Encounter for completion of form with patient 01/12/2015  . Left arm numbness 09/30/2014  . Change in vision 01/09/2014  . Knee pain, bilateral 01/09/2014  . Left knee pain 09/13/2013  . Hyperglycemia 11/05/2012  . Carotid artery stenosis 11/05/2012  . Hypothyroidism 10/31/2012  . Pure hypercholesterolemia 10/31/2012  . Dysphagia, unspecified(787.20)  10/31/2012  . Coronary atherosclerosis of native coronary artery 10/31/2012  . Anemia 10/31/2012  . Essential hypertension, benign 10/31/2012   Pura Spice, PT, DPT # [id number]   04/22/2015, 8:57 AM  [place] [facility] [address] [place], [place], [address] Phone: [phone]   Fax:  [phone]

## 2015-04-22 ENCOUNTER — Ambulatory Visit: Payer: PRIVATE HEALTH INSURANCE

## 2015-04-25 ENCOUNTER — Ambulatory Visit: Payer: Medicare Other | Attending: Cardiology | Admitting: Physical Therapy

## 2015-04-25 DIAGNOSIS — M25562 Pain in left knee: Secondary | ICD-10-CM | POA: Diagnosis not present

## 2015-04-25 DIAGNOSIS — M25662 Stiffness of left knee, not elsewhere classified: Secondary | ICD-10-CM | POA: Diagnosis not present

## 2015-04-25 DIAGNOSIS — M25661 Stiffness of right knee, not elsewhere classified: Secondary | ICD-10-CM | POA: Insufficient documentation

## 2015-04-25 DIAGNOSIS — M25561 Pain in right knee: Secondary | ICD-10-CM | POA: Insufficient documentation

## 2015-04-25 DIAGNOSIS — M6281 Muscle weakness (generalized): Secondary | ICD-10-CM | POA: Insufficient documentation

## 2015-04-25 DIAGNOSIS — R262 Difficulty in walking, not elsewhere classified: Secondary | ICD-10-CM | POA: Insufficient documentation

## 2015-04-25 NOTE — Therapy (Signed)
[place] [facility] [address] [place], [place], [address] Phone: [phone]   Fax:  [phone]  Physical Therapy Treatment  Patient Details  Name: Daniel Austin MRN: [id number] Date of Birth: 06/20/1941 Referring Provider:  Isaias Cowman, MD  Encounter Date: 04/25/2015      PT End of Session - 04/25/15 1041    Visit Number 3   Number of Visits 8   Date for PT Re-Evaluation 05/16/15   Authorization - Visit Number 3   Authorization - Number of Visits 10   PT Start Time 0933   PT Stop Time 1042   PT Time Calculation (min) 69 min   Equipment Utilized During Treatment Other (comment)   Activity Tolerance Patient tolerated treatment well;Patient limited by pain   Behavior During Therapy [facility] for tasks assessed/performed      Past Medical History  Diagnosis Date  . Hypercholesterolemia   . Hypothyroidism   . Hypertension   . Fractures, multiple     Metal rod in left foot  . CAD (coronary artery disease)   . Carotid artery stenosis     high-grade symptomatic right sided s/p stent twice  . Syncope     second degree heart block s/p St Jude's pacemaker placement 12/08  . Anemia   . Ulcer of gastric fundus   . Hyperglycemia     Past Surgical History  Procedure Laterality Date  . Pacemaker insertion  08/2007  . Hernia repair      inguinal  . Percutaneous placement intravascular stent cervical carotid artery      twice, right carotid artery  . Heart stint  11/04/2014    [facility]    There were no vitals filed for this visit.  Visit Diagnosis:  Knee pain, bilateral  Difficulty walking  Joint stiffness of knee, left  Stiffness of knee joint, right  Muscle weakness      Subjective Assessment - 04/25/15 0943    Subjective Pt. reports no B knee pain at this time.  Pt. entered PT clinic carrying SPC.     Patient is accompained by: Family member   Limitations Standing;Lifting;Walking   Diagnostic tests  x-ray done.   Patient Stated Goals Discussed Silver Sneakers ex. program.     Currently in Pain? No/denies       OBJECTIVE: There.ex.:  Nustep L6 10 min. B UE/LE (warm-up/no charge).   Standing lateral stepping in //-bars with partial squats 3x (cuing for upright posture).  Attempted R/L step downs 3x but stopped secondary to significant knee crepitus/ hip compensatory mvmt.  TG B knee flexion (tolerable knee flexion)- 30x/ heel raises 20x.  Manual tx.: Supine proximal tibia grade III mobs. 3x20 sec. Supine R/L LE hamstring/ piriformis/ quad stretches 4x each with static holds. Patellar mobs. (all planes)- 5x each ("feels good").  Gait training: amb in clinic/ hallway with 2-point gait pattern with use of SPC (instructed to use of L side to counterbalance R knee).  Instructed in consistent hip/knee flexion and heel strike.   Pt response for medical necessity: R knee hypomobility but improved after manual tx. R knee pain 5/10 after tx./ stretches.         PT Long Term Goals - 04/18/15 1352    PT LONG TERM GOAL #1   Title Pt. I with HEP to increase B LE muscle strength 1/2 muscle grade to improve pain-free mobility/ walking.    Time 4   Period Weeks   Status New  PT LONG TERM GOAL #2   Title Pt. will increase LEFS to >50 out of 80 to improve overall pain-free mobility.     Baseline 38 out of 80 on 7/25   Time 4   Period Weeks   Status New   PT LONG TERM GOAL #3   Title Pt. able to ambulate wiith improved recip. gait pattern and use of SPC with decrease c/o pain to improve mobility/ return to work.     Time 4   Period Weeks   Status New   PT LONG TERM GOAL #4   Title Pt. able to return to work at [employer] with no B knee limitations.     Time 4   Period Weeks   Status New            Plan - 04/25/15 1042    Clinical Impression Statement L knee hamstring tightness/ limited knee ext. in supine position.  Pain limited with grade II-III B prox. mobs.   Pt. has significant antalgic gait (varus positioning) while walking with/ without use of SPC.  Significant R knee crepitus/ "bone on bone" slipping noted.  Increase R knee pain after tx. to 5/10.   Pt will benefit from skilled therapeutic intervention in order to improve on the following deficits Abnormal gait;Decreased endurance;Hypomobility;Decreased activity tolerance;Decreased strength;Pain;Difficulty walking;Decreased mobility;Decreased balance;Decreased range of motion;Improper body mechanics;Decreased safety awareness   Rehab Potential Fair   PT Frequency 2x / week   PT Duration 4 weeks   PT Treatment/Interventions ADLs/Self Care Home Management;Aquatic Therapy;Cryotherapy;Electrical Stimulation;Moist Heat;Balance training;Therapeutic exercise;Therapeutic activities;Functional mobility training;Stair training;Gait training;Neuromuscular re-education;Patient/family education;Passive range of motion;Manual techniques   PT Next Visit Plan review HEP/ manual tx. to knees.    PT Home Exercise Plan see handouts   Consulted and Agree with Plan of Care Patient        Problem List Patient Active Problem List   Diagnosis Date Noted  . Encounter for completion of form with patient 01/12/2015  . Left arm numbness 09/30/2014  . Change in vision 01/09/2014  . Knee pain, bilateral 01/09/2014  . Left knee pain 09/13/2013  . Hyperglycemia 11/05/2012  . Carotid artery stenosis 11/05/2012  . Hypothyroidism 10/31/2012  . Pure hypercholesterolemia 10/31/2012  . Dysphagia, unspecified(787.20) 10/31/2012  . Coronary atherosclerosis of native coronary artery 10/31/2012  . Anemia 10/31/2012  . Essential hypertension, benign 10/31/2012   Pura Spice, PT, DPT # [id number]   04/25/2015, 1:11 PM  [facility] [address] [place], [place], [address] Phone: [phone]   Fax:  [phone]

## 2015-04-27 ENCOUNTER — Ambulatory Visit: Payer: Medicare Other | Admitting: Physical Therapy

## 2015-04-27 DIAGNOSIS — M25562 Pain in left knee: Secondary | ICD-10-CM | POA: Diagnosis not present

## 2015-04-27 DIAGNOSIS — M25561 Pain in right knee: Secondary | ICD-10-CM

## 2015-04-27 DIAGNOSIS — M6281 Muscle weakness (generalized): Secondary | ICD-10-CM | POA: Diagnosis not present

## 2015-04-27 DIAGNOSIS — M25661 Stiffness of right knee, not elsewhere classified: Secondary | ICD-10-CM | POA: Diagnosis not present

## 2015-04-27 DIAGNOSIS — M25662 Stiffness of left knee, not elsewhere classified: Secondary | ICD-10-CM | POA: Diagnosis not present

## 2015-04-27 DIAGNOSIS — R262 Difficulty in walking, not elsewhere classified: Secondary | ICD-10-CM | POA: Diagnosis not present

## 2015-04-27 NOTE — Therapy (Signed)
[facility] [facility] [address] [place], [place], [address] Phone: [phone]   Fax:  [phone]  Physical Therapy Treatment  Patient Details  Name: Daniel Austin MRN: [id number] Date of Birth: 1940-12-18 Referring Provider:  Isaias Cowman, MD  Encounter Date: 04/27/2015      PT End of Session - 04/27/15 1245    Visit Number 4   Number of Visits 8   Date for PT Re-Evaluation 05/16/15   Authorization - Visit Number 4   Authorization - Number of Visits 10   PT Start Time 0940   PT Stop Time 1779   PT Time Calculation (min) 43 min   Equipment Utilized During Treatment Other (comment)   Activity Tolerance Patient tolerated treatment well;Patient limited by pain;Patient limited by fatigue   Behavior During Therapy [facility] for tasks assessed/performed      Past Medical History  Diagnosis Date  . Hypercholesterolemia   . Hypothyroidism   . Hypertension   . Fractures, multiple     Metal rod in left foot  . CAD (coronary artery disease)   . Carotid artery stenosis     high-grade symptomatic right sided s/p stent twice  . Syncope     second degree heart block s/p St Jude's pacemaker placement 12/08  . Anemia   . Ulcer of gastric fundus   . Hyperglycemia     Past Surgical History  Procedure Laterality Date  . Pacemaker insertion  08/2007  . Hernia repair      inguinal  . Percutaneous placement intravascular stent cervical carotid artery      twice, right carotid artery  . Heart stint  11/04/2014    [facility]    There were no vitals filed for this visit.  Visit Diagnosis:  Knee pain, bilateral  Difficulty walking  Joint stiffness of knee, left  Stiffness of knee joint, right  Muscle weakness      Subjective Assessment - 04/27/15 1242    Subjective Pt reports no knee pain but notes some L knee discomfort and popping with standing activities. Pt reports walking without SPC in the house yesterday.    Patient  is accompained by: Family member   Limitations Standing;Lifting;Walking   Diagnostic tests x-ray done.   Patient Stated Goals Discussed Silver Sneakers ex. program.  Increase leg strengthening    Currently in Pain? No/denies      OBJECTIVE: There.ex.: Nustep L7 10 min. B UE/LE (warm-up/no charge). Gait with 2.5 # ankle weights and SPC. Airex standing in // bars EO: 1 min, EC: 30 seconds, semi-tandem 30 sec B. SAQ with manual resistance (moderate). Supine clams with blue TB. Bolster bridging x 30. TG squats 2x20 reps.  Manual tx.: Supine proximal tibia grade III mobs. 3x20 sec. Supine R/L LE hamstring/ piriformis/ quad stretches 4x each with static holds.  Pt response for medical necessity: R knee hypomobility but improved after manual tx.R knee swelling noted. L knee limited in standing activities by bone on bone crepitus.          PT Long Term Goals - 04/18/15 1352    PT LONG TERM GOAL #1   Title Pt. I with HEP to increase B LE muscle strength 1/2 muscle grade to improve pain-free mobility/ walking.    Time 4   Period Weeks   Status New   PT LONG TERM GOAL #2   Title Pt. will increase LEFS to >50 out of 80 to improve overall pain-free mobility.  Baseline 38 out of 80 on 7/25   Time 4   Period Weeks   Status New   PT LONG TERM GOAL #3   Title Pt. able to ambulate wiith improved recip. gait pattern and use of SPC with decrease c/o pain to improve mobility/ return to work.     Time 4   Period Weeks   Status New   PT LONG TERM GOAL #4   Title Pt. able to return to work at [employer] with no B knee limitations.     Time 4   Period Weeks   Status New            Plan - 04/27/15 1245    Clinical Impression Statement Pt had difficulty with standing exercises due to L knee popping. Noted swelling to R knee. Pt is limited to standing for 5 mins before pain limits his activity. Pt continues to ambulate with varus gait and decreased knee extension. Pt  continues to benefit from hip strengthening, glut strengthening and quad exercises.    Pt will benefit from skilled therapeutic intervention in order to improve on the following deficits Abnormal gait;Decreased endurance;Hypomobility;Decreased activity tolerance;Decreased strength;Pain;Difficulty walking;Decreased mobility;Decreased balance;Decreased range of motion;Improper body mechanics;Decreased safety awareness   Rehab Potential Fair   PT Frequency 2x / week   PT Duration 4 weeks   PT Treatment/Interventions ADLs/Self Care Home Management;Aquatic Therapy;Cryotherapy;Electrical Stimulation;Moist Heat;Balance training;Therapeutic exercise;Therapeutic activities;Functional mobility training;Stair training;Gait training;Neuromuscular re-education;Patient/family education;Passive range of motion;Manual techniques   PT Next Visit Plan progress HEP, discuss silver sneakers    PT Home Exercise Plan progress clamshells to blue TB   Consulted and Agree with Plan of Care Patient        Problem List Patient Active Problem List   Diagnosis Date Noted  . Encounter for completion of form with patient 01/12/2015  . Left arm numbness 09/30/2014  . Change in vision 01/09/2014  . Knee pain, bilateral 01/09/2014  . Left knee pain 09/13/2013  . Hyperglycemia 11/05/2012  . Carotid artery stenosis 11/05/2012  . Hypothyroidism 10/31/2012  . Pure hypercholesterolemia 10/31/2012  . Dysphagia, unspecified(787.20) 10/31/2012  . Coronary atherosclerosis of native coronary artery 10/31/2012  . Anemia 10/31/2012  . Essential hypertension, benign 10/31/2012   Pura Spice, PT, DPT # [id number]   04/27/2015, 12:57 PM  [place] [facility] [address] [place], [place], [address] Phone: [phone]   Fax:  [phone]

## 2015-05-02 ENCOUNTER — Ambulatory Visit: Payer: Medicare Other | Admitting: Physical Therapy

## 2015-05-04 ENCOUNTER — Encounter: Payer: Self-pay | Admitting: Physical Therapy

## 2015-05-04 ENCOUNTER — Ambulatory Visit: Payer: Medicare Other | Admitting: Physical Therapy

## 2015-05-04 DIAGNOSIS — M25661 Stiffness of right knee, not elsewhere classified: Secondary | ICD-10-CM | POA: Diagnosis not present

## 2015-05-04 DIAGNOSIS — M25562 Pain in left knee: Secondary | ICD-10-CM | POA: Diagnosis not present

## 2015-05-04 DIAGNOSIS — M25662 Stiffness of left knee, not elsewhere classified: Secondary | ICD-10-CM | POA: Diagnosis not present

## 2015-05-04 DIAGNOSIS — M6281 Muscle weakness (generalized): Secondary | ICD-10-CM

## 2015-05-04 DIAGNOSIS — M25561 Pain in right knee: Secondary | ICD-10-CM

## 2015-05-04 DIAGNOSIS — R262 Difficulty in walking, not elsewhere classified: Secondary | ICD-10-CM | POA: Diagnosis not present

## 2015-05-04 NOTE — Therapy (Signed)
[facility] [address] [place], [place], [address] Phone: [phone]   Fax:  [phone]  Physical Therapy Treatment  Patient Details  Name: Daniel Austin MRN: [id number] Date of Birth: May 08, 1941 Referring Provider:  Isaias Cowman, MD  Encounter Date: 05/04/2015      PT End of Session - 05/04/15 1324    Visit Number 5   Number of Visits 8   Date for PT Re-Evaluation 05/16/15   Authorization - Visit Number 5   Authorization - Number of Visits 10   PT Start Time [phone]   PT Stop Time 5465   PT Time Calculation (min) 52 min   Equipment Utilized During Treatment Gait belt   Activity Tolerance Patient tolerated treatment well;Patient limited by pain;Patient limited by fatigue   Behavior During Therapy [facility] for tasks assessed/performed      Past Medical History  Diagnosis Date  . Hypercholesterolemia   . Hypothyroidism   . Hypertension   . Fractures, multiple     Metal rod in left foot  . CAD (coronary artery disease)   . Carotid artery stenosis     high-grade symptomatic right sided s/p stent twice  . Syncope     second degree heart block s/p St Jude's pacemaker placement 12/08  . Anemia   . Ulcer of gastric fundus   . Hyperglycemia     Past Surgical History  Procedure Laterality Date  . Pacemaker insertion  08/2007  . Hernia repair      inguinal  . Percutaneous placement intravascular stent cervical carotid artery      twice, right carotid artery  . Heart stint  11/04/2014    [facility]    There were no vitals filed for this visit.  Visit Diagnosis:  Knee pain, bilateral  Difficulty walking  Joint stiffness of knee, left  Stiffness of knee joint, right  Muscle weakness      Subjective Assessment - 05/04/15 1104    Subjective Pt reports no knee pain but some stiffness after a 5 hour carride to [place]. Pt got new knee braces from his sister over the weekend. Pt reports some difficulty walking  in the grass yesterday.    Limitations Standing;Lifting;Walking   Diagnostic tests x-ray done.   Patient Stated Goals Discussed Silver Sneakers ex. program.  Increase leg strengthening    Currently in Pain? No/denies       OBJECTIVE: There ex: Nu step Level 7 10 mins (warm up, no charge). Standing 4 way hip on L LE 3 x10 sets (requires use of UE at //-bars for safety/balance). Attempted standing SLS star drill but unable to maintain SLS due to knee crepitus. TG: Normal feet position/VMO/VLO x 30 each. Gait around the gym with SPC to work on upright posture. Manual: in supine: B LE/lumbar stretching. Noted L hip tightness with piriformis stretching.   Pt response for medical necessity: Pt continues to be limited by crepitus bilaterally for standing quad strengthening. Pt continues to benefit from PT with progression to Silver Sneakers (aquatic ex.) to help maintain mobility until Feb surgery.           PT Long Term Goals - 04/18/15 1352    PT LONG TERM GOAL #1   Title Pt. I with HEP to increase B LE muscle strength 1/2 muscle grade to improve pain-free mobility/ walking.    Time 4   Period Weeks   Status New   PT LONG TERM GOAL #2  Title Pt. will increase LEFS to >50 out of 80 to improve overall pain-free mobility.     Baseline 38 out of 80 on 7/25   Time 4   Period Weeks   Status New   PT LONG TERM GOAL #3   Title Pt. able to ambulate wiith improved recip. gait pattern and use of SPC with decrease c/o pain to improve mobility/ return to work.     Time 4   Period Weeks   Status New   PT LONG TERM GOAL #4   Title Pt. able to return to work at [employer] with no B knee limitations.     Time 4   Period Weeks   Status New            Plan - 05/04/15 1325    Clinical Impression Statement Pt demonstrates increased crepitus with SLS on R LE. Pt is able to perform SLS exercises on L with mild crepitus. Pt is progressing with HEP and supine exercises well. Discussed  importance of follow through on Silver Sneakers to allow more weight bearing strengthening due to limitations in standing on land with knee crepitus. Pt ambulates and stands with genu varus knee position and forward flexed trunk. Pt is unable to achieve TKE with stance or supine.    Pt will benefit from skilled therapeutic intervention in order to improve on the following deficits Abnormal gait;Decreased endurance;Hypomobility;Decreased activity tolerance;Decreased strength;Pain;Difficulty walking;Decreased mobility;Decreased balance;Decreased range of motion;Improper body mechanics;Decreased safety awareness   Rehab Potential Fair   PT Frequency 2x / week   PT Duration 4 weeks   PT Treatment/Interventions ADLs/Self Care Home Management;Aquatic Therapy;Cryotherapy;Electrical Stimulation;Moist Heat;Balance training;Therapeutic exercise;Therapeutic activities;Functional mobility training;Stair training;Gait training;Neuromuscular re-education;Patient/family education;Passive range of motion;Manual techniques   PT Next Visit Plan Provide new HEP, increase resistance to sitting/supine exercises    PT Home Exercise Plan Continue as before    Recommended Other Services Silver sneakers especially aquatic portion    Consulted and Agree with Plan of Care Patient        Problem List Patient Active Problem List   Diagnosis Date Noted  . Encounter for completion of form with patient 01/12/2015  . Left arm numbness 09/30/2014  . Change in vision 01/09/2014  . Knee pain, bilateral 01/09/2014  . Left knee pain 09/13/2013  . Hyperglycemia 11/05/2012  . Carotid artery stenosis 11/05/2012  . Hypothyroidism 10/31/2012  . Pure hypercholesterolemia 10/31/2012  . Dysphagia, unspecified(787.20) 10/31/2012  . Coronary atherosclerosis of native coronary artery 10/31/2012  . Anemia 10/31/2012  . Essential hypertension, benign 10/31/2012   Pura Spice, PT, DPT # [id number]   05/04/2015, 2:06 PM  [facility] [address] [place], [place], [address] Phone: [phone]   Fax:  [phone]

## 2015-05-09 ENCOUNTER — Ambulatory Visit: Payer: Medicare Other | Admitting: Physical Therapy

## 2015-05-09 DIAGNOSIS — M25662 Stiffness of left knee, not elsewhere classified: Secondary | ICD-10-CM | POA: Diagnosis not present

## 2015-05-09 DIAGNOSIS — R262 Difficulty in walking, not elsewhere classified: Secondary | ICD-10-CM

## 2015-05-09 DIAGNOSIS — M6281 Muscle weakness (generalized): Secondary | ICD-10-CM | POA: Diagnosis not present

## 2015-05-09 DIAGNOSIS — M25562 Pain in left knee: Principal | ICD-10-CM

## 2015-05-09 DIAGNOSIS — M25661 Stiffness of right knee, not elsewhere classified: Secondary | ICD-10-CM | POA: Diagnosis not present

## 2015-05-09 DIAGNOSIS — M25561 Pain in right knee: Secondary | ICD-10-CM | POA: Diagnosis not present

## 2015-05-09 NOTE — Therapy (Signed)
[facility] [address] [place], [place], [address] Phone: [phone]   Fax:  [phone]  Physical Therapy Treatment  Patient Details  Name: Daniel Austin MRN: [id number] Date of Birth: [birth year] Referring Provider:  Isaias Cowman, MD  Encounter Date: 05/09/2015      PT End of Session - 05/09/15 1112    Visit Number 6   Number of Visits 8   Date for PT Re-Evaluation 05/16/15   Authorization - Visit Number 6   Authorization - Number of Visits 10   PT Start Time 3825   PT Stop Time 1013   PT Time Calculation (min) 49 min   Activity Tolerance Patient tolerated treatment well;Patient limited by pain;Patient limited by fatigue   Behavior During Therapy [facility] for tasks assessed/performed      Past Medical History  Diagnosis Date  . Hypercholesterolemia   . Hypothyroidism   . Hypertension   . Fractures, multiple     Metal rod in left foot  . CAD (coronary artery disease)   . Carotid artery stenosis     high-grade symptomatic right sided s/p stent twice  . Syncope     second degree heart block s/p St Jude's pacemaker placement 12/08  . Anemia   . Ulcer of gastric fundus   . Hyperglycemia     Past Surgical History  Procedure Laterality Date  . Pacemaker insertion  08/2007  . Hernia repair      inguinal  . Percutaneous placement intravascular stent cervical carotid artery      twice, right carotid artery  . Heart stint  11/04/2014    [facility]    There were no vitals filed for this visit.  Visit Diagnosis:  Knee pain, bilateral  Difficulty walking  Joint stiffness of knee, left  Stiffness of knee joint, right  Muscle weakness      Subjective Assessment - 05/09/15 0949    Subjective Pt. states he is doing alright this morning.  R knee discomfort with stairs yesterday but not today.     Patient is accompained by: Family member   Limitations Standing;Lifting;Walking   Diagnostic tests x-ray  done.   Patient Stated Goals Discussed Silver Sneakers ex. program.  Increase leg strengthening    Currently in Pain? No/denies       OBJECTIVE: There ex: Nu step L7, 13 mins for warmup, good speed and no rest breaks (no charge). Standing 4 way hip with bilateral UE support  (abduction/adduction/flexion/extension) 10 each direction R  LE and 5 each direction L LE (pt limited in stance on R knee due to crepitus/buckling). Standing marching with B UE support x 30. Resisted gait: forward and backwards BTB x 5 each with B UE support. Side stepping with no resistance with B UE support x 5. TG: 30 knee flexion/calf raises. Therapy ball hamstring curls/bridging in supine x 30 each. Progressed and reviewed new HEP exercises (see handout).  Manual: B lumbar/hip flexion/hamstring/piriformis stretching.    Pt response for medical necessity: Pt continues to ambulate with genu varus gait pattern. Pt is limited in standing activities due to R knee buckling/crepitus. Discussed the importance of silver sneakers and water exercise to continue to increase pt's strengthening.         PT Education - 05/09/15 1337    Education provided Yes   Education Details See standing HEP (instructed to use secure surface for UE assist and not chair/RW as noted in pictures).     Person(s)  Educated Patient   Methods Explanation;Demonstration;Verbal cues   Comprehension Verbalized understanding;Returned demonstration             PT Long Term Goals - 04/18/15 1352    PT LONG TERM GOAL #1   Title Pt. I with HEP to increase B LE muscle strength 1/2 muscle grade to improve pain-free mobility/ walking.    Time 4   Period Weeks   Status New   PT LONG TERM GOAL #2   Title Pt. will increase LEFS to >50 out of 80 to improve overall pain-free mobility.     Baseline 38 out of 80 on 7/25   Time 4   Period Weeks   Status New   PT LONG TERM GOAL #3   Title Pt. able to ambulate wiith improved recip. gait pattern and use of  SPC with decrease c/o pain to improve mobility/ return to work.     Time 4   Period Weeks   Status New   PT LONG TERM GOAL #4   Title Pt. able to return to work at [employer] with no B knee limitations.     Time 4   Period Weeks   Status New               Plan - 05/09/15 0950    Clinical Impression Statement R LE closed chain ex. in //-bars stopped by significant R knee crepitus/buckling.  Pt continues to be limited in endurance and R knee buckling but progressing with strengthening. Pt reports being able to ambulate more areound his house with [facility].    Pt will benefit from skilled therapeutic intervention in order to improve on the following deficits Abnormal gait;Decreased endurance;Hypomobility;Decreased activity tolerance;Decreased strength;Pain;Difficulty walking;Decreased mobility;Decreased balance;Decreased range of motion;Improper body mechanics;Decreased safety awareness   Rehab Potential Fair   PT Frequency 2x / week   PT Duration 4 weeks   PT Treatment/Interventions ADLs/Self Care Home Management;Aquatic Therapy;Cryotherapy;Electrical Stimulation;Moist Heat;Balance training;Therapeutic exercise;Therapeutic activities;Functional mobility training;Stair training;Gait training;Neuromuscular re-education;Patient/family education;Passive range of motion;Manual techniques   PT Home Exercise Plan see handouts   Recommended Other Services Silver Sneakers, esp.    Consulted and Agree with Plan of Care Patient        Problem List Patient Active Problem List   Diagnosis Date Noted  . Encounter for completion of form with patient 01/12/2015  . Left arm numbness 09/30/2014  . Change in vision 01/09/2014  . Knee pain, bilateral 01/09/2014  . Left knee pain 09/13/2013  . Hyperglycemia 11/05/2012  . Carotid artery stenosis 11/05/2012  . Hypothyroidism 10/31/2012  . Pure hypercholesterolemia 10/31/2012  . Dysphagia, unspecified(787.20) 10/31/2012  . Coronary  atherosclerosis of native coronary artery 10/31/2012  . Anemia 10/31/2012  . Essential hypertension, benign 10/31/2012   Pura Spice, PT, DPT # [phone]   05/09/2015, 2:37 PM  [facility] [address] [place], [place], [address] Phone: [phone]   Fax:  [phone]

## 2015-05-10 ENCOUNTER — Ambulatory Visit: Payer: Medicare Other

## 2015-05-11 ENCOUNTER — Encounter: Payer: Self-pay | Admitting: Physical Therapy

## 2015-05-11 ENCOUNTER — Ambulatory Visit: Payer: Medicare Other | Admitting: Physical Therapy

## 2015-05-11 DIAGNOSIS — M6281 Muscle weakness (generalized): Secondary | ICD-10-CM

## 2015-05-11 DIAGNOSIS — M25561 Pain in right knee: Secondary | ICD-10-CM

## 2015-05-11 DIAGNOSIS — R262 Difficulty in walking, not elsewhere classified: Secondary | ICD-10-CM

## 2015-05-11 DIAGNOSIS — M25562 Pain in left knee: Secondary | ICD-10-CM | POA: Diagnosis not present

## 2015-05-11 DIAGNOSIS — M25661 Stiffness of right knee, not elsewhere classified: Secondary | ICD-10-CM

## 2015-05-11 DIAGNOSIS — M25662 Stiffness of left knee, not elsewhere classified: Secondary | ICD-10-CM | POA: Diagnosis not present

## 2015-05-11 NOTE — Patient Instructions (Addendum)
Warm up: Nu step L7 10 mins with no rest breaks (no charge).   In // bars with B UE support mini squats x 15. Pt required cuing for foot position and proper knee alignment behind his toes.   Standing hip abduction in // bars with B UE support x 10 each leg (mild R knee crepitus noted but no c/o pain).   Lateral band walking with green band x 10' in // bars x 1 repetition bilaterally   Sit to stand training with HHA x2 in // bars   Stair training with HHA x 4 steps x 2 repetitions with cuing for upright trunk posture to limit knee flexion and promote hip flexion/extension.   Sit to stand training with no HHA 2 sets x 7 repetitions with cuing for upright posture, minimal if any knee popping and no pain.   Supine bridging x 10 repetitions for 2 sets with cuing to push through his heels, no complaints of knee pain.   Supine B LE/lumbar stretching

## 2015-05-11 NOTE — Therapy (Signed)
[facility] [address] [place], [place], [address] Phone: [phone]   Fax:  [phone]  Physical Therapy Treatment  Patient Details  Name: Daniel Austin MRN: [id number] Date of Birth: [birth year] Referring Provider:  Isaias Cowman, MD  Encounter Date: 05/11/2015      PT End of Session - 05/11/15 1011    Visit Number 7   Number of Visits 8   Date for PT Re-Evaluation 05/16/15   Authorization - Visit Number 7   Authorization - Number of Visits 10   PT Start Time 4656   PT Stop Time 1032   PT Time Calculation (min) 48 min   Equipment Utilized During Treatment Gait belt   Activity Tolerance Patient tolerated treatment well;Patient limited by fatigue   Behavior During Therapy [facility] for tasks assessed/performed      Past Medical History  Diagnosis Date  . Hypercholesterolemia   . Hypothyroidism   . Hypertension   . Fractures, multiple     Metal rod in left foot  . CAD (coronary artery disease)   . Carotid artery stenosis     high-grade symptomatic right sided s/p stent twice  . Syncope     second degree heart block s/p St Jude's pacemaker placement 12/08  . Anemia   . Ulcer of gastric fundus   . Hyperglycemia     Past Surgical History  Procedure Laterality Date  . Pacemaker insertion  08/2007  . Hernia repair      inguinal  . Percutaneous placement intravascular stent cervical carotid artery      twice, right carotid artery  . Heart stint  11/04/2014    [facility]    There were no vitals filed for this visit.  Visit Diagnosis:  No diagnosis found.      Subjective Assessment - 05/11/15 1010    Subjective Pt reports no knee pain currently. Pt reports not feeling well yesterday but doing better today. Pt still having trouble going up the steps at home (4 steps).    Patient is accompained by: Family member   Limitations Standing;Lifting;Walking   Diagnostic tests x-ray done.   Patient Stated Goals  Discussed Silver Sneakers ex. program.  Increase leg strengthening    Currently in Pain? No/denies        Warm up: Nu step L7 10 mins with no rest breaks (no charge).   In // bars with B UE support mini squats x 15. Pt required cuing for foot position and proper knee alignment behind his toes. Pt limited by improper form and B knee pain.   Standing hip abduction in // bars with B UE support x 10 each leg (mild R knee crepitus noted but no c/o pain).   Lateral band walking with green band x 10' in // bars x 1 repetition bilaterally (mild R crepitus noted, limited by pain).   Sit to stand training with HHA x2 in // bars (Pt required verbal cuing for knees behind the toes and to stand upright with sit to stand transfers)  Stair training with HHA x 4 steps x 2 repetitions with cuing for upright trunk posture to limit knee flexion and promote hip flexion/extension.   Sit to stand training with no HHA 2 sets x 7 repetitions with cuing for upright posture, minimal if any knee popping and no pain.   Supine bridging x 10 repetitions for 2 sets with cuing to push through his heels, no complaints of knee pain.  Supine B LE/lumbar stretching (hip tightness noted B R>L).         PT Education - 05/11/15 1251    Education provided Yes   Education Details pt educated to perform standing exercises and stair climbing with upright posture hitting with heel first.    Person(s) Educated Patient   Methods Explanation;Demonstration   Comprehension Verbalized understanding;Returned demonstration             PT Long Term Goals - 04/18/15 1352    PT LONG TERM GOAL #1   Title Pt. I with HEP to increase B LE muscle strength 1/2 muscle grade to improve pain-free mobility/ walking.    Time 4   Period Weeks   Status New   PT LONG TERM GOAL #2   Title Pt. will increase LEFS to >50 out of 80 to improve overall pain-free mobility.     Baseline 38 out of 80 on 7/25   Time 4   Period Weeks    Status New   PT LONG TERM GOAL #3   Title Pt. able to ambulate wiith improved recip. gait pattern and use of SPC with decrease c/o pain to improve mobility/ return to work.     Time 4   Period Weeks   Status New   PT LONG TERM GOAL #4   Title Pt. able to return to work at [employer] with no B knee limitations.     Time 4   Period Weeks   Status New          Plan - 05/11/15 1251    Clinical Impression Statement Pt progressing with closed chain exercises with B UE support. Pt taught proper knee over toes alignment with squats and ascending/descending the steps. Pt had good carryover with proper alignment. Pt still requires minimal cuing to stay upright and for correct standing posture with all activities. Pt ambulates with genu varum of the knees and decreased hip extension. B hip weakness noted with bridging and standing hip abduction.    Pt will benefit from skilled therapeutic intervention in order to improve on the following deficits Abnormal gait;Decreased endurance;Hypomobility;Decreased activity tolerance;Decreased strength;Pain;Difficulty walking;Decreased mobility;Decreased balance;Decreased range of motion;Improper body mechanics;Decreased safety awareness   Rehab Potential Fair   PT Frequency 2x / week   PT Duration 4 weeks   PT Treatment/Interventions ADLs/Self Care Home Management;Aquatic Therapy;Cryotherapy;Electrical Stimulation;Moist Heat;Balance training;Therapeutic exercise;Therapeutic activities;Functional mobility training;Stair training;Gait training;Neuromuscular re-education;Patient/family education;Passive range of motion;Manual techniques   PT Next Visit Plan continue to progress strengthening in closed chain; see how silver sneakers went   PT Home Exercise Plan continue with standing exercises and bridging    Consulted and Agree with Plan of Care Patient        Problem List Patient Active Problem List   Diagnosis Date Noted  . Encounter for completion  of form with patient 01/12/2015  . Left arm numbness 09/30/2014  . Change in vision 01/09/2014  . Knee pain, bilateral 01/09/2014  . Left knee pain 09/13/2013  . Hyperglycemia 11/05/2012  . Carotid artery stenosis 11/05/2012  . Hypothyroidism 10/31/2012  . Pure hypercholesterolemia 10/31/2012  . Dysphagia, unspecified(787.20) 10/31/2012  . Coronary atherosclerosis of native coronary artery 10/31/2012  . Anemia 10/31/2012  . Essential hypertension, benign 10/31/2012    Royce Macadamia 05/11/2015, 12:55 PM  [facility] [address] [place], [place], [address] Phone: [phone]   Fax:  [phone]

## 2015-05-14 DIAGNOSIS — I255 Ischemic cardiomyopathy: Secondary | ICD-10-CM | POA: Diagnosis not present

## 2015-05-14 DIAGNOSIS — I5022 Chronic systolic (congestive) heart failure: Secondary | ICD-10-CM | POA: Diagnosis not present

## 2015-05-14 DIAGNOSIS — R531 Weakness: Secondary | ICD-10-CM | POA: Diagnosis not present

## 2015-05-14 DIAGNOSIS — R5383 Other fatigue: Secondary | ICD-10-CM | POA: Diagnosis not present

## 2015-05-14 DIAGNOSIS — E079 Disorder of thyroid, unspecified: Secondary | ICD-10-CM | POA: Diagnosis not present

## 2015-05-14 DIAGNOSIS — R42 Dizziness and giddiness: Secondary | ICD-10-CM | POA: Diagnosis not present

## 2015-05-14 DIAGNOSIS — Z951 Presence of aortocoronary bypass graft: Secondary | ICD-10-CM | POA: Diagnosis not present

## 2015-05-14 DIAGNOSIS — I341 Nonrheumatic mitral (valve) prolapse: Secondary | ICD-10-CM | POA: Diagnosis not present

## 2015-05-14 DIAGNOSIS — R110 Nausea: Secondary | ICD-10-CM | POA: Diagnosis not present

## 2015-05-14 DIAGNOSIS — R079 Chest pain, unspecified: Secondary | ICD-10-CM | POA: Diagnosis not present

## 2015-05-14 DIAGNOSIS — R5381 Other malaise: Secondary | ICD-10-CM | POA: Diagnosis not present

## 2015-05-14 DIAGNOSIS — Z7982 Long term (current) use of aspirin: Secondary | ICD-10-CM | POA: Diagnosis not present

## 2015-05-14 DIAGNOSIS — I440 Atrioventricular block, first degree: Secondary | ICD-10-CM | POA: Diagnosis not present

## 2015-05-14 DIAGNOSIS — I10 Essential (primary) hypertension: Secondary | ICD-10-CM | POA: Diagnosis not present

## 2015-05-14 DIAGNOSIS — Z950 Presence of cardiac pacemaker: Secondary | ICD-10-CM | POA: Diagnosis not present

## 2015-05-14 DIAGNOSIS — E785 Hyperlipidemia, unspecified: Secondary | ICD-10-CM | POA: Diagnosis not present

## 2015-05-14 DIAGNOSIS — Z79899 Other long term (current) drug therapy: Secondary | ICD-10-CM | POA: Diagnosis not present

## 2015-05-14 DIAGNOSIS — R0602 Shortness of breath: Secondary | ICD-10-CM | POA: Diagnosis not present

## 2015-05-14 DIAGNOSIS — I2510 Atherosclerotic heart disease of native coronary artery without angina pectoris: Secondary | ICD-10-CM | POA: Diagnosis not present

## 2015-05-14 DIAGNOSIS — R55 Syncope and collapse: Secondary | ICD-10-CM | POA: Diagnosis not present

## 2015-05-16 ENCOUNTER — Encounter: Payer: Self-pay | Admitting: Physical Therapy

## 2015-05-16 ENCOUNTER — Ambulatory Visit: Payer: Medicare Other | Admitting: Physical Therapy

## 2015-05-16 DIAGNOSIS — M25562 Pain in left knee: Principal | ICD-10-CM

## 2015-05-16 DIAGNOSIS — M25561 Pain in right knee: Secondary | ICD-10-CM | POA: Diagnosis not present

## 2015-05-16 DIAGNOSIS — R262 Difficulty in walking, not elsewhere classified: Secondary | ICD-10-CM

## 2015-05-16 DIAGNOSIS — M25661 Stiffness of right knee, not elsewhere classified: Secondary | ICD-10-CM | POA: Diagnosis not present

## 2015-05-16 DIAGNOSIS — M25662 Stiffness of left knee, not elsewhere classified: Secondary | ICD-10-CM | POA: Diagnosis not present

## 2015-05-16 DIAGNOSIS — M6281 Muscle weakness (generalized): Secondary | ICD-10-CM

## 2015-05-16 NOTE — Addendum Note (Signed)
Addended by: Pura Spice on: 05/16/2015 01:17 PM   Modules accepted: Orders

## 2015-05-16 NOTE — Therapy (Signed)
[facility] [address] [place], [place], [address] Phone: [phone]   Fax:  [phone]  Physical Therapy Treatment  Patient Details  Name: Daniel Austin MRN: [id number] Date of Birth: 04/26/1941 Referring Provider:  Isaias Cowman, MD  Encounter Date: 05/16/2015      PT End of Session - 05/16/15 1054    Visit Number 8   Number of Visits 8   Date for PT Re-Evaluation 06/13/15   Authorization - Visit Number 8   Authorization - Number of Visits 18   PT Start Time 0936   PT Stop Time 1028   PT Time Calculation (min) 52 min   Equipment Utilized During Treatment Gait belt   Activity Tolerance Patient tolerated treatment well;No increased pain   Behavior During Therapy [facility] for tasks assessed/performed      Past Medical History  Diagnosis Date  . Hypercholesterolemia   . Hypothyroidism   . Hypertension   . Fractures, multiple     Metal rod in left foot  . CAD (coronary artery disease)   . Carotid artery stenosis     high-grade symptomatic right sided s/p stent twice  . Syncope     second degree heart block s/p St Jude's pacemaker placement 12/08  . Anemia   . Ulcer of gastric fundus   . Hyperglycemia     Past Surgical History  Procedure Laterality Date  . Pacemaker insertion  08/2007  . Hernia repair      inguinal  . Percutaneous placement intravascular stent cervical carotid artery      twice, right carotid artery  . Heart stint  11/04/2014    [facility]    There were no vitals filed for this visit.  Visit Diagnosis:  Knee pain, bilateral  Difficulty walking  Joint stiffness of knee, left  Stiffness of knee joint, right  Muscle weakness      Subjective Assessment - 05/16/15 1043    Subjective Patient reports no pain but having stiffness and increased popping yesterday. Patient was mowing over the weekend and passed out due to dehydration. Patient was transported to [facility] and everything  with his pacemaker was normal. Patient and his wife joined [employer] last week and are looking to use the pool mainly.   Patient is accompained by: Family member   Limitations Standing;Walking;House hold activities   Currently in Pain? No/denies        OBJECTIVE: There ex: Nustep L7, 10 mins for warmup, good speed and no rest breaks (no charge). In // bars, standing forwards walking/toe walking without UE support and 4# weights bilateral ankles x 6 each. In // bars, side stepping with 4# weights and one UE support x 4. On blue airex, static standing balance 1 min x2 (patient required verbal cuing for upright posture but was able to self-correct) with 4# weights on ankles. Heel raises on blue airex x30 with 4# weights. TG: 30 knee flexion/calf raises.   Manual: B lumbar/hip flexion/hamstring/piriformis stretching. R patellar mobilizations grade III all 4 planes (tenderness noted with medial mobilizations; hypomobility noted with medial mobs).  Pt response for medical necessity: Pt continues to ambulate with genu varus gait pattern and decreased step length. Patient continues to benefit from strengthening and is working progressing HEP to include aquatic exercises for B LE strengthening.      PT Long Term Goals - 05/16/15 1059    PT LONG TERM GOAL #1   Title Pt will be I with HEP  to increase B LE muscle strength 1/2 muscle grade to improve pain free mobility/walking.    Time 4   Period Weeks   Status Partially Met   PT LONG TERM GOAL #2   Title Pt will increase LEFS to >50 out of 80 to improve overall pain free mobility.    Baseline 38 out of 80 7/25; reassess next visit   Time 4   Period Weeks   Status On-going   PT LONG TERM GOAL #3   Title Pt will be able to ambulate with improved recip. gait pattern and use of SPC with decrease c/o pain to improve mobility/return to work.    Time 4   Period Weeks   Status Partially Met   PT LONG TERM GOAL #4   Title Pt able to  return to work at [employer] with no B knee limitations.    Time 4   Period Weeks   Status Not Met               Plan - 05/16/15 1045    Clinical Impression Statement Patient continues to progress with bilateral lower extremity strengthening but is limited in close chain due to R>L knee popping and discomfort. Patient had noted R knee swelling and decreased patellar medial movement. Patient has noted tenderness with patellar mobilizations in the lateral directions. Patient is complaint with his HEP and continues to progress; patient has added in [employer] and is looking for exercises for the pool. Patient continues to ambulate with genu varus gait and an anatalgic gait pattern. Patient ambulates with SPC and step to gait pattern with bilateral decreased step length.    Pt will benefit from skilled therapeutic intervention in order to improve on the following deficits Abnormal gait;Decreased activity tolerance;Decreased balance;Decreased mobility;Improper body mechanics;Hypomobility;Difficulty walking;Decreased range of motion;Decreased endurance;Decreased strength   Rehab Potential Fair   PT Frequency 2x / week   PT Duration 4 weeks   PT Treatment/Interventions ADLs/Self Care Home Management;Cryotherapy;Moist Heat;Therapeutic activities;Therapeutic exercise;Balance training;Functional mobility training;Gait training;Stair training;Neuromuscular re-education;Patient/family education;Manual techniques;Passive range of motion   PT Next Visit Plan progress closed chain exercise; have water exercise patient can perform at [employer]; REASSESS LEFS   PT Home Exercise Plan continue with HEP; progress next session   Recommended Other Services Silver sneakers classes   Consulted and Agree with Plan of Care Patient        Problem List Patient Active Problem List   Diagnosis Date Noted  . Encounter for completion of form with patient 01/12/2015  . Left arm numbness  09/30/2014  . Change in vision 01/09/2014  . Knee pain, bilateral 01/09/2014  . Left knee pain 09/13/2013  . Hyperglycemia 11/05/2012  . Carotid artery stenosis 11/05/2012  . Hypothyroidism 10/31/2012  . Pure hypercholesterolemia 10/31/2012  . Dysphagia, unspecified(787.20) 10/31/2012  . Coronary atherosclerosis of native coronary artery 10/31/2012  . Anemia 10/31/2012  . Essential hypertension, benign 10/31/2012    Lavone Neri, SPT 05/16/2015, 11:02 AM    [place] [facility] [address] [place], [place], [address] Phone: [phone]   Fax:  [phone]

## 2015-05-18 ENCOUNTER — Encounter: Payer: Self-pay | Admitting: Physical Therapy

## 2015-05-18 ENCOUNTER — Ambulatory Visit: Payer: Medicare Other | Admitting: Physical Therapy

## 2015-05-18 DIAGNOSIS — M25662 Stiffness of left knee, not elsewhere classified: Secondary | ICD-10-CM | POA: Diagnosis not present

## 2015-05-18 DIAGNOSIS — M25562 Pain in left knee: Principal | ICD-10-CM

## 2015-05-18 DIAGNOSIS — R262 Difficulty in walking, not elsewhere classified: Secondary | ICD-10-CM

## 2015-05-18 DIAGNOSIS — M25561 Pain in right knee: Secondary | ICD-10-CM

## 2015-05-18 DIAGNOSIS — M25661 Stiffness of right knee, not elsewhere classified: Secondary | ICD-10-CM

## 2015-05-18 DIAGNOSIS — M6281 Muscle weakness (generalized): Secondary | ICD-10-CM | POA: Diagnosis not present

## 2015-05-18 NOTE — Therapy (Signed)
[facility] [address] [place], [place], [address] Phone: [phone]   Fax:  [phone]  Physical Therapy Treatment  Patient Details  Name: Daniel Austin MRN: [id number] Date of Birth: [birth year] Referring Provider:  Isaias Cowman, MD  Encounter Date: 05/18/2015      PT End of Session - 05/18/15 1238    Visit Number 9   Number of Visits 16   Date for PT Re-Evaluation 06/13/15   Authorization - Visit Number 9   Authorization - Number of Visits 18   PT Start Time 9326   PT Stop Time 1012   PT Time Calculation (min) 47 min   Activity Tolerance Patient tolerated treatment well;No increased pain   Behavior During Therapy [facility] for tasks assessed/performed      Past Medical History  Diagnosis Date  . Hypercholesterolemia   . Hypothyroidism   . Hypertension   . Fractures, multiple     Metal rod in left foot  . CAD (coronary artery disease)   . Carotid artery stenosis     high-grade symptomatic right sided s/p stent twice  . Syncope     second degree heart block s/p St Jude's pacemaker placement 12/08  . Anemia   . Ulcer of gastric fundus   . Hyperglycemia     Past Surgical History  Procedure Laterality Date  . Pacemaker insertion  08/2007  . Hernia repair      inguinal  . Percutaneous placement intravascular stent cervical carotid artery      twice, right carotid artery  . Heart stint  11/04/2014    [facility]    There were no vitals filed for this visit.  Visit Diagnosis:  Knee pain, bilateral  Difficulty walking  Joint stiffness of knee, left  Stiffness of knee joint, right  Muscle weakness      Subjective Assessment - 05/18/15 0955    Subjective Pt. states he is doing well and reports no pain at this time.  Pt. entered PT clinic with use of SPC and varus gait posture.     Patient is accompained by: Family member   Limitations Standing;Walking;House hold activities   Diagnostic tests  x-ray done.   Patient Stated Goals Discussed Silver Sneakers ex. program.  Increase leg strengthening    Currently in Pain? No/denies       OBJECTIVE: There ex: Warm up Nustep L8 10 minutes no rest breaks, no charge (majority of extension movement from hips/lumbar). Resisted gait 4 planes in // bars x 5 1 BTB with B light touch UE support (increased knee crepitus with side stepping, no pain reported). Squats 10x2 in // bars with B UE support. Attempted SLS with B UE support pt unable to hold without crepitus and c/o instability. On airex, eyes open/eyes closed/looking up to ceiling without UE support x 1 min each. In supine: bridging x 30 with 5 second holds. SLR with 4# weights 2 x15 B. Manual: pateallar mobs in all 4 directions grade III (R mobility > L mobility; hypomobility with L lateral to medial and superior to inferior noted). B LE/lumbar stretching (L piriformis tightness noted).  Reviewed pt's aquatic HEP (see handout).  Pt response  for medical necessity: Pt continues to ambulate with varus gait pattern and SPC. Pt continues to benefit from strengthening to improve functional mobility.             PT Education - 05/18/15 1237    Education provided Yes   Education  Details Pt given water exercises to attempt at [place] [facility] to transition to independent strengthening program   Person(s) Educated Patient   Methods Demonstration;Explanation;Handout   Comprehension Verbalized understanding             PT Long Term Goals - 05/16/15 1059    PT LONG TERM GOAL #1   Title Pt will be I with HEP to increase B LE muscle strength 1/2 muscle grade to improve pain free mobility/walking.    Time 4   Period Weeks   Status Partially Met   PT LONG TERM GOAL #2   Title Pt will increase LEFS to >50 out of 80 to improve overall pain free mobility.    Baseline 38 out of 80 7/25; reassess next visit   Time 4   Period Weeks   Status On-going   PT LONG TERM GOAL #3   Title Pt  will be able to ambulate with improved recip. gait pattern and use of SPC with decrease c/o pain to improve mobility/return to work.    Time 4   Period Weeks   Status Partially Met   PT LONG TERM GOAL #4   Title Pt able to return to work at [employer] with no B knee limitations.    Time 4   Period Weeks   Status Not Met               Plan - 05/18/15 1239    Clinical Impression Statement Pt continues to be limited by B knee crepitus and discomfort with closed chained activities. Pt is progressing with balance challenges and able to perform balance tasks without the use of UE support. Pt ambulates with SPC and varus gait pattern with the majority of his motion from his hips. Pt continues to be compliant with HEP and reports it helping and benefiting his stair climbing at home.    Pt will benefit from skilled therapeutic intervention in order to improve on the following deficits Abnormal gait;Decreased activity tolerance;Decreased balance;Decreased mobility;Decreased strength;Hypomobility;Difficulty walking;Decreased endurance;Decreased range of motion;Improper body mechanics   Rehab Potential Fair   PT Frequency 2x / week   PT Duration 4 weeks   PT Treatment/Interventions ADLs/Self Care Home Management;Cryotherapy;Moist Heat;Balance training;Therapeutic exercise;Therapeutic activities;Manual techniques;Functional mobility training;Stair training;Gait training;Neuromuscular re-education;Patient/family education   PT Next Visit Plan continue to progress strengthening; discuss dropping frequency if pt tolerating water program    PT Home Exercise Plan see aquatic handout    Recommended Other Services aquatic strengthening    Consulted and Agree with Plan of Care Patient        Problem List Patient Active Problem List   Diagnosis Date Noted  . Encounter for completion of form with patient 01/12/2015  . Left arm numbness 09/30/2014  . Change in vision 01/09/2014  . Knee pain,  bilateral 01/09/2014  . Left knee pain 09/13/2013  . Hyperglycemia 11/05/2012  . Carotid artery stenosis 11/05/2012  . Hypothyroidism 10/31/2012  . Pure hypercholesterolemia 10/31/2012  . Dysphagia, unspecified(787.20) 10/31/2012  . Coronary atherosclerosis of native coronary artery 10/31/2012  . Anemia 10/31/2012  . Essential hypertension, benign 10/31/2012    Lavone Neri, SPT  05/18/2015, 12:48 PM  [facility] [address] [place], [place], [address] Phone: [phone]   Fax:  [phone]

## 2015-05-23 ENCOUNTER — Other Ambulatory Visit (INDEPENDENT_AMBULATORY_CARE_PROVIDER_SITE_OTHER): Payer: Medicare Other

## 2015-05-23 ENCOUNTER — Other Ambulatory Visit: Payer: PRIVATE HEALTH INSURANCE

## 2015-05-23 ENCOUNTER — Encounter: Payer: PRIVATE HEALTH INSURANCE | Admitting: Physical Therapy

## 2015-05-23 DIAGNOSIS — R739 Hyperglycemia, unspecified: Secondary | ICD-10-CM | POA: Diagnosis not present

## 2015-05-23 DIAGNOSIS — E7800 Pure hypercholesterolemia, unspecified: Secondary | ICD-10-CM

## 2015-05-23 DIAGNOSIS — I10 Essential (primary) hypertension: Secondary | ICD-10-CM

## 2015-05-23 DIAGNOSIS — E039 Hypothyroidism, unspecified: Secondary | ICD-10-CM | POA: Diagnosis not present

## 2015-05-23 LAB — LIPID PANEL
CHOLESTEROL: 135 mg/dL (ref 0–200)
HDL: 49.3 mg/dL (ref >39.00)
LDL Cholesterol: 68 mg/dL (ref 0–99)
NonHDL: 85.74
TRIGLYCERIDES: 88 mg/dL (ref 0.0–149.0)
Total CHOL/HDL Ratio: 3
VLDL: 17.6 mg/dL (ref 0.0–40.0)

## 2015-05-23 LAB — HEPATIC FUNCTION PANEL
ALBUMIN: 4.3 g/dL (ref 3.5–5.2)
ALT: 8 U/L (ref 0–53)
AST: 14 U/L (ref 0–37)
Alkaline Phosphatase: 72 U/L (ref 39–117)
Bilirubin, Direct: 0.1 mg/dL (ref 0.0–0.3)
Total Bilirubin: 0.6 mg/dL (ref 0.2–1.2)
Total Protein: 6.5 g/dL (ref 6.0–8.3)

## 2015-05-23 LAB — BASIC METABOLIC PANEL
BUN: 14 mg/dL (ref 6–23)
CALCIUM: 9.2 mg/dL (ref 8.4–10.5)
CO2: 29 mEq/L (ref 19–32)
CREATININE: 0.98 mg/dL (ref 0.40–1.50)
Chloride: 99 mEq/L (ref 96–112)
GFR: 79.25 mL/min (ref >60.00)
Glucose, Bld: 85 mg/dL (ref 70–99)
Potassium: 4.8 mEq/L (ref 3.5–5.1)
Sodium: 136 mEq/L (ref 135–145)

## 2015-05-23 LAB — TSH: TSH: 1.03 u[IU]/mL (ref 0.35–4.50)

## 2015-05-23 LAB — HEMOGLOBIN A1C: HEMOGLOBIN A1C: 5.8 % (ref 4.6–6.5)

## 2015-05-24 ENCOUNTER — Ambulatory Visit: Payer: Medicare Other | Admitting: Physical Therapy

## 2015-05-24 ENCOUNTER — Encounter: Payer: Self-pay | Admitting: Physical Therapy

## 2015-05-24 DIAGNOSIS — M6281 Muscle weakness (generalized): Secondary | ICD-10-CM

## 2015-05-24 DIAGNOSIS — R262 Difficulty in walking, not elsewhere classified: Secondary | ICD-10-CM | POA: Diagnosis not present

## 2015-05-24 DIAGNOSIS — M25561 Pain in right knee: Secondary | ICD-10-CM

## 2015-05-24 DIAGNOSIS — M25661 Stiffness of right knee, not elsewhere classified: Secondary | ICD-10-CM | POA: Diagnosis not present

## 2015-05-24 DIAGNOSIS — M25562 Pain in left knee: Secondary | ICD-10-CM | POA: Diagnosis not present

## 2015-05-24 DIAGNOSIS — M25662 Stiffness of left knee, not elsewhere classified: Secondary | ICD-10-CM

## 2015-05-24 NOTE — Therapy (Signed)
[facility] [address] [place], [place], [address] Phone: [phone]   Fax:  [phone]  Physical Therapy Treatment  Patient Details  Name: Daniel Austin MRN: [id number] Date of Birth: 04/15/1941 Referring Provider:  Isaias Cowman, MD  Encounter Date: 05/24/2015      PT End of Session - 05/24/15 1039    Visit Number 10   Number of Visits 16   Date for PT Re-Evaluation 06/12/15   Authorization - Visit Number 10   Authorization - Number of Visits 18   PT Start Time 6789   PT Stop Time 1021   PT Time Calculation (min) 56 min   Equipment Utilized During Treatment Gait belt   Activity Tolerance Patient tolerated treatment well;No increased pain   Behavior During Therapy [facility] for tasks assessed/performed      Past Medical History  Diagnosis Date  . Hypercholesterolemia   . Hypothyroidism   . Hypertension   . Fractures, multiple     Metal rod in left foot  . CAD (coronary artery disease)   . Carotid artery stenosis     high-grade symptomatic right sided s/p stent twice  . Syncope     second degree heart block s/p St Jude's pacemaker placement 12/08  . Anemia   . Ulcer of gastric fundus   . Hyperglycemia     Past Surgical History  Procedure Laterality Date  . Pacemaker insertion  08/2007  . Hernia repair      inguinal  . Percutaneous placement intravascular stent cervical carotid artery      twice, right carotid artery  . Heart stint  11/04/2014    [facility]    There were no vitals filed for this visit.  Visit Diagnosis:  Knee pain, bilateral  Difficulty walking  Joint stiffness of knee, left  Stiffness of knee joint, right  Muscle weakness      Subjective Assessment - 05/24/15 1035    Subjective Pt arrives to PT tx session with SPC and bilateral knee braces. Pt reports no pain at present time. Pt states that he had increased knee popping this morning but is feeling fine now. Pt reports  exercising in his neighbor's pool over the weekend and reports it feeling good.    Patient is accompained by: Family member   Limitations Standing;Walking;Lifting   Diagnostic tests x-ray done   Patient Stated Goals increase leg strength    Currently in Pain? No/denies      OBJECTIVE: There ex: Nustep L8 10 mins (warm-up, no charge). Resisted side stepping with 2 BTB x 4 each direction with B UE support. TKE with Green TB x 30 each leg (pt required cuing to not use hip as compensation). Seated hamstring curls with green TB resistance x 30 each leg. Isometrics for knee extension and flexion x 10 each direction each leg. Gait training: Hallway and around gym walking to focus on increased hip flexion/knee flexion and decreased trunk sway and circumduction with gait with SPC and modified [place]. Pt requires constant verbal cues for proper gait and equal step length.   Pt response to Tx for medical necessity: Pt progressing and transitioning to pool based workouts. Pt continues to benefit from gait training to work on a fluid "normalized" gait pattern.          PT Long Term Goals - 05/16/15 1059    PT LONG TERM GOAL #1   Title Pt will be I with HEP to increase B LE  muscle strength 1/2 muscle grade to improve pain free mobility/walking.    Time 4   Period Weeks   Status Partially Met   PT LONG TERM GOAL #2   Title Pt will increase LEFS to >50 out of 80 to improve overall pain free mobility.    Baseline 38 out of 80 7/25; reassess next visit   Time 4   Period Weeks   Status On-going   PT LONG TERM GOAL #3   Title Pt will be able to ambulate with improved recip. gait pattern and use of SPC with decrease c/o pain to improve mobility/return to work.    Time 4   Period Weeks   Status Partially Met   PT LONG TERM GOAL #4   Title Pt able to return to work at [employer] with no B knee limitations.    Time 4   Period Weeks   Status Not Met             Plan - 05/24/15  1042    Clinical Impression Statement Pt ambulates with mod I with a SPC with increased trunk sway and motion from his hips. Pt is able to correct with moderate verbal cuing but has difficulty with knee flexion while walking. Pt compensates for lack of knee motion from his hips with standing TKE. Pt continues to progress with resisted strengthening in open chain due to knee crepitus.    Pt will benefit from skilled therapeutic intervention in order to improve on the following deficits Abnormal gait;Decreased activity tolerance;Decreased balance;Decreased mobility;Decreased strength;Hypomobility;Impaired flexibility;Improper body mechanics;Postural dysfunction;Decreased endurance;Decreased range of motion;Difficulty walking;Pain   Rehab Potential Good   PT Frequency 2x / week   PT Duration 4 weeks   PT Treatment/Interventions ADLs/Self Care Home Management;Cryotherapy;Moist Heat;Balance training;Therapeutic exercise;Therapeutic activities;Functional mobility training;Stair training;Gait training;Patient/family education;Manual techniques   PT Next Visit Plan progressing strengthening/LEFS/gait training    PT Home Exercise Plan continue with current plan. encouraged pool workout    Consulted and Agree with Plan of Care Patient        Problem List Patient Active Problem List   Diagnosis Date Noted  . Encounter for completion of form with patient 01/12/2015  . Left arm numbness 09/30/2014  . Change in vision 01/09/2014  . Knee pain, bilateral 01/09/2014  . Left knee pain 09/13/2013  . Hyperglycemia 11/05/2012  . Carotid artery stenosis 11/05/2012  . Hypothyroidism 10/31/2012  . Pure hypercholesterolemia 10/31/2012  . Dysphagia, unspecified(787.20) 10/31/2012  . Coronary atherosclerosis of native coronary artery 10/31/2012  . Anemia 10/31/2012  . Essential hypertension, benign 10/31/2012   Lavone Neri, SPTs  05/24/2015, 11:04 AM  [facility]  [facility] [address] [place], [place], [address] Phone: [phone]   Fax:  [phone]

## 2015-05-25 ENCOUNTER — Encounter: Payer: PRIVATE HEALTH INSURANCE | Admitting: Physical Therapy

## 2015-05-25 ENCOUNTER — Ambulatory Visit (INDEPENDENT_AMBULATORY_CARE_PROVIDER_SITE_OTHER): Payer: Medicare Other | Admitting: Internal Medicine

## 2015-05-25 VITALS — BP 120/82 | HR 60 | Temp 97.8°F | Ht 68.0 in | Wt 195.2 lb

## 2015-05-25 DIAGNOSIS — Z23 Encounter for immunization: Secondary | ICD-10-CM | POA: Diagnosis not present

## 2015-05-25 DIAGNOSIS — I2510 Atherosclerotic heart disease of native coronary artery without angina pectoris: Secondary | ICD-10-CM | POA: Diagnosis not present

## 2015-05-25 DIAGNOSIS — I10 Essential (primary) hypertension: Secondary | ICD-10-CM

## 2015-05-25 DIAGNOSIS — E039 Hypothyroidism, unspecified: Secondary | ICD-10-CM | POA: Diagnosis not present

## 2015-05-25 DIAGNOSIS — E7800 Pure hypercholesterolemia, unspecified: Secondary | ICD-10-CM

## 2015-05-25 DIAGNOSIS — D649 Anemia, unspecified: Secondary | ICD-10-CM | POA: Diagnosis not present

## 2015-05-25 DIAGNOSIS — I6529 Occlusion and stenosis of unspecified carotid artery: Secondary | ICD-10-CM

## 2015-05-25 DIAGNOSIS — R739 Hyperglycemia, unspecified: Secondary | ICD-10-CM

## 2015-05-25 DIAGNOSIS — M25562 Pain in left knee: Secondary | ICD-10-CM

## 2015-05-25 DIAGNOSIS — M25561 Pain in right knee: Secondary | ICD-10-CM

## 2015-05-25 MED ORDER — PANTOPRAZOLE SODIUM 40 MG PO TBEC: DELAYED_RELEASE_TABLET | ORAL | Status: DC

## 2015-05-25 MED ORDER — LEVOTHYROXINE SODIUM 112 MCG PO TABS: ORAL_TABLET | ORAL | Status: DC

## 2015-05-25 MED ORDER — LORAZEPAM 1 MG PO TABS: ORAL_TABLET | ORAL | Status: DC

## 2015-05-25 MED ORDER — LISINOPRIL 40 MG PO TABS: ORAL_TABLET | ORAL | Status: DC

## 2015-05-25 MED ORDER — CITALOPRAM HYDROBROMIDE 20 MG PO TABS: ORAL_TABLET | ORAL | Status: DC

## 2015-05-25 NOTE — Progress Notes (Signed)
Patient ID: Daniel Austin, male   DOB: [birth year], 74 y.o.   MRN: [id number]   Subjective:    Patient ID: Daniel Austin, male    DOB: 01/22/1941, 74 y.o.   MRN: [id number]  HPI  Patient here for a scheduled follow up.  He is doing better.  Going to rehab.  Doing well with exercise.  Feels stronger.  Knees limiting him.  No chest pain or tightness.  No sob.  Eating and drinking well.  No nausea or vomiting.  Bowels stable.  Feels handling stress relatively well.     Past Medical History  Diagnosis Date  . Hypercholesterolemia   . Hypothyroidism   . Hypertension   . Fractures, multiple     Metal rod in left foot  . CAD (coronary artery disease)   . Carotid artery stenosis     high-grade symptomatic right sided s/p stent twice  . Syncope     second degree heart block s/p St Jude's pacemaker placement 12/08  . Anemia   . Ulcer of gastric fundus   . Hyperglycemia    Past Surgical History  Procedure Laterality Date  . Pacemaker insertion  08/2007  . Hernia repair      inguinal  . Percutaneous placement intravascular stent cervical carotid artery      twice, right carotid artery  . Heart stint  11/04/2014    [facility]   Family History  Problem Relation Age of Onset  . Heart disease Mother     CHF  . Heart disease Father     CHF  . Thyroid disease Father     hypothyroidism  . Diabetes Sister     diet controlled  . Thyroid disease Sister   . Thyroid disease Sister   . Colon polyps Father   . Colon cancer Neg Hx   . Prostate cancer Neg Hx    Social History   Social History  . Marital Status: Married    Spouse Name: N/A  . Number of Children: 1  . Years of Education: N/A   Social History Main Topics  . Smoking status: Never Smoker   . Smokeless tobacco: Never Used  . Alcohol Use: No  . Drug Use: No  . Sexual Activity: Not Asked   Other Topics Concern  . None   Social History Narrative    Outpatient Encounter Prescriptions as of 05/25/2015    Medication Sig  . Aspirin 81 MG EC tablet Take 81 mg by mouth daily.  . citalopram (CELEXA) 20 MG tablet TAKE 1 TABLET (20 MG TOTAL) BY MOUTH DAILY.  Marland Kitchen clopidogrel (PLAVIX) 75 MG tablet TAKE 1 TABLET (75 MG TOTAL) BY MOUTH DAILY.  Marland Kitchen CRESTOR 20 MG tablet Take 20 mg by mouth daily.  . fexofenadine (ALLEGRA) 180 MG tablet Take 180 mg by mouth daily.  Marland Kitchen levothyroxine (SYNTHROID, LEVOTHROID) 112 MCG tablet TAKE 1 TABLET (112 MCG TOTAL) BY MOUTH DAILY.  Marland Kitchen lisinopril (PRINIVIL,ZESTRIL) 40 MG tablet TAKE 1 TABLET (40 MG TOTAL) BY MOUTH DAILY.  Marland Kitchen LORazepam (ATIVAN) 1 MG tablet TAKE 1 TABLET BY MOUTH AT BEDTIME AS NEEDED FOR ANXIETY  . metoprolol tartrate (LOPRESSOR) 25 MG tablet Take 1 tablet (25 mg total) by mouth 2 (two) times daily.  . naproxen sodium (ANAPROX) 220 MG tablet Take 220 mg by mouth as needed (knee pain).  . nitroGLYCERIN (NITROSTAT) 0.4 MG SL tablet Place 0.4 mg under the tongue every 5 (five) minutes as needed. If a third tablet is  needed please call 911.  . pantoprazole (PROTONIX) 40 MG tablet TAKE 1 TABLET (40 MG TOTAL) BY MOUTH DAILY.  . [DISCONTINUED] citalopram (CELEXA) 20 MG tablet TAKE 1 TABLET (20 MG TOTAL) BY MOUTH DAILY.  . [DISCONTINUED] levothyroxine (SYNTHROID, LEVOTHROID) 112 MCG tablet TAKE 1 TABLET (112 MCG TOTAL) BY MOUTH DAILY.  . [DISCONTINUED] lisinopril (PRINIVIL,ZESTRIL) 40 MG tablet TAKE 1 TABLET (40 MG TOTAL) BY MOUTH DAILY.  . [DISCONTINUED] LORazepam (ATIVAN) 1 MG tablet TAKE 1 TABLET BY MOUTH AT BEDTIME AS NEEDED FOR ANXIETY  . [DISCONTINUED] pantoprazole (PROTONIX) 40 MG tablet TAKE 1 TABLET (40 MG TOTAL) BY MOUTH DAILY.   No facility-administered encounter medications on file as of 05/25/2015.    Review of Systems  Constitutional: Negative for appetite change and unexpected weight change.  HENT: Negative for congestion and sinus pressure.   Respiratory: Negative for cough, chest tightness and shortness of breath.   Cardiovascular: Negative for chest  pain, palpitations and leg swelling.  Gastrointestinal: Negative for nausea, vomiting, abdominal pain and diarrhea.  Genitourinary: Negative for dysuria and difficulty urinating.  Musculoskeletal:       Knee pain as outlined.  Persistent.   Skin: Negative for color change and rash.  Neurological: Negative for dizziness, light-headedness and headaches.  Psychiatric/Behavioral: Negative for dysphoric mood and agitation.       Objective:     Blood pressure rechecked by me:  134/78  Physical Exam  Constitutional: He appears well-developed and well-nourished. No distress.  HENT:  Nose: Nose normal.  Mouth/Throat: Oropharynx is clear and moist.  Eyes: Conjunctivae are normal. Right eye exhibits no discharge. Left eye exhibits no discharge.  Neck: Neck supple. No thyromegaly present.  Cardiovascular: Normal rate and regular rhythm.   Pulmonary/Chest: Effort normal and breath sounds normal. No respiratory distress.  Abdominal: Soft. Bowel sounds are normal. There is no tenderness.  Musculoskeletal: He exhibits no edema or tenderness.  Lymphadenopathy:    He has no cervical adenopathy.  Skin: No rash noted. No erythema.  Psychiatric: He has a normal mood and affect. His behavior is normal.    BP 120/82 mmHg  Pulse 60  Temp(Src) 97.8 F (36.6 C) (Oral)  Ht '5\' 8"'  (1.727 m)  Wt 195 lb 4 oz (88.565 kg)  BMI 29.69 kg/m2  SpO2 96% Wt Readings from Last 3 Encounters:  05/25/15 195 lb 4 oz (88.565 kg)  03/09/15 197 lb (89.359 kg)  01/11/15 198 lb 14.4 oz (90.22 kg)     Lab Results  Component Value Date   WBC 4.6 09/28/2014   HGB 13.5 09/28/2014   HCT 41.1 09/28/2014   PLT 262.0 09/28/2014   GLUCOSE 85 05/23/2015   CHOL 135 05/23/2015   TRIG 88.0 05/23/2015   HDL 49.30 05/23/2015   LDLCALC 68 05/23/2015   ALT 8 05/23/2015   AST 14 05/23/2015   NA 136 05/23/2015   K 4.8 05/23/2015   CL 99 05/23/2015   CREATININE 0.98 05/23/2015   BUN 14 05/23/2015   CO2 29 05/23/2015    TSH 1.03 05/23/2015   PSA 0.28 05/28/2014   HGBA1C 5.8 05/23/2015   MICROALBUR 1.6 05/25/2014       Assessment & Plan:   Problem List Items Addressed This Visit    Anemia    EGD 12/20/10 as outlined in overview.  Colonoscopy 12/20/10 - diverticulosis.  Follow cbc.       Coronary atherosclerosis of native coronary artery    Cath 11/04/14 revealed three-vessel CAD as outlined.  S/p  stent to mid LAD.  On plavix.  Seeing Dr Saralyn Pilar.  Going to rehab.  Doing better.  Follow.        Relevant Medications   lisinopril (PRINIVIL,ZESTRIL) 40 MG tablet   Essential hypertension, benign    Blood pressure under good control.  Continue same medication regimen.  Follow pressures.  Follow metabolic panel.        Relevant Medications   lisinopril (PRINIVIL,ZESTRIL) 40 MG tablet   Hyperglycemia    Low carb diet and exercise.  Follow met b and a1c.       Hypothyroidism    On thyroid replacement.  Follow tsh.       Relevant Medications   levothyroxine (SYNTHROID, LEVOTHROID) 112 MCG tablet   Knee pain, bilateral    Has OA.  Limits his ability to stand and walk.  Going to rehab for his heart.  Follow.       Pure hypercholesterolemia    Low cholesterol diet and exercise.  Follow lipid panel and liver function tests.        Relevant Medications   lisinopril (PRINIVIL,ZESTRIL) 40 MG tablet    Other Visit Diagnoses    Encounter for immunization    -  Primary        Einar Pheasant, MD

## 2015-05-25 NOTE — Progress Notes (Signed)
Pre-visit discussion using our clinic review tool. No additional management support is needed unless otherwise documented below in the visit note.  

## 2015-05-26 ENCOUNTER — Ambulatory Visit: Payer: Medicare Other | Attending: Cardiology | Admitting: Physical Therapy

## 2015-05-26 DIAGNOSIS — M25661 Stiffness of right knee, not elsewhere classified: Secondary | ICD-10-CM

## 2015-05-26 DIAGNOSIS — M25561 Pain in right knee: Secondary | ICD-10-CM | POA: Insufficient documentation

## 2015-05-26 DIAGNOSIS — M25562 Pain in left knee: Secondary | ICD-10-CM | POA: Diagnosis not present

## 2015-05-26 DIAGNOSIS — M25662 Stiffness of left knee, not elsewhere classified: Secondary | ICD-10-CM | POA: Diagnosis not present

## 2015-05-26 DIAGNOSIS — R262 Difficulty in walking, not elsewhere classified: Secondary | ICD-10-CM

## 2015-05-26 DIAGNOSIS — M6281 Muscle weakness (generalized): Secondary | ICD-10-CM

## 2015-05-26 NOTE — Therapy (Signed)
[facility] [address] [place], [place], [address] Phone: [phone]   Fax:  [phone]  Physical Therapy Treatment  Patient Details  Name: Daniel Austin MRN: [id number] Date of Birth: 01/06/1941 Referring Provider:  Isaias Cowman, MD  Encounter Date: 05/26/2015      PT End of Session - 05/26/15 1035    Visit Number 11   Number of Visits 16   Date for PT Re-Evaluation 06/12/15   Authorization - Visit Number 11   Authorization - Number of Visits 18   PT Start Time 6294   PT Stop Time 1019   PT Time Calculation (min) 46 min   Equipment Utilized During Treatment Gait belt   Activity Tolerance Patient tolerated treatment well;No increased pain   Behavior During Therapy [facility] for tasks assessed/performed      Past Medical History  Diagnosis Date  . Hypercholesterolemia   . Hypothyroidism   . Hypertension   . Fractures, multiple     Metal rod in left foot  . CAD (coronary artery disease)   . Carotid artery stenosis     high-grade symptomatic right sided s/p stent twice  . Syncope     second degree heart block s/p St Jude's pacemaker placement 12/08  . Anemia   . Ulcer of gastric fundus   . Hyperglycemia     Past Surgical History  Procedure Laterality Date  . Pacemaker insertion  08/2007  . Hernia repair      inguinal  . Percutaneous placement intravascular stent cervical carotid artery      twice, right carotid artery  . Heart stint  11/04/2014    [facility]    There were no vitals filed for this visit.  Visit Diagnosis:  Difficulty walking  Knee pain, bilateral  Joint stiffness of knee, left  Stiffness of knee joint, right  Muscle weakness      Subjective Assessment - 05/26/15 1032    Subjective Pt ambulates with B hinged knee braces and SPC. Pt reports L knee pain at a 2/10 but no crepitus this morning. Pt states he had an increase in crepitus yesterday. Pt has not been swimming again  since last weekend.    Patient is accompained by: Family member   Limitations Standing;Walking;Lifting   Diagnostic tests x-ray done   Patient Stated Goals increase leg strength    Currently in Pain? Yes   Pain Score 2    Pain Location Knee   Pain Orientation Left   Pain Descriptors / Indicators Aching;Discomfort;Dull   Pain Type Chronic pain   Pain Onset More than a month ago   Pain Frequency Intermittent   Aggravating Factors  standing activities.         OBJECTIVE: There ex: Nustep L8 10 mins (warm-up, no charge). Resisted side stepping with 2 BTB x 10 each direction with light touch on // bars. Gait for increased step length, hip flexion and extension with decreased trunk sway with SPC and modified independence x 204'. Pt requires constant verbal cues for proper gait and equal step length. In supine, on green therapy ball: hamstring curls/bridging x 30 each. Manual: B LE/lumbar stretching. Patellar mobs L knee medial grade II mobs for pain control 4 x 20 seconds.   Pt response to Tx for medical necessity: Pt continuing to progress towards more independence with pool HEP. Pt continues to benefit from strengthening and gait training to increase fluid gait pattern with decreased circumduction and trunk sway.  PT Long Term Goals - 05/26/15 1037    PT LONG TERM GOAL #1   Title Pt will be I with HEP to increase B LE muscle strength 1/2 muscle grade to improve pain free mobility/walking.    Time 4   Period Weeks   Status Partially Met   PT LONG TERM GOAL #2   Title Pt will increase LEFS to >50 out of 80 to improve overall pain free mobility.    Baseline 38 out of 80 7/25; 36/80 on 05/26/15   Time 4   Period Weeks   Status On-going   PT LONG TERM GOAL #3   Title Pt will be able to ambulate with improved recip. gait pattern and use of SPC with decrease c/o pain to improve mobility/return to work.    Time 4   Period Weeks   Status Partially Met   PT LONG TERM GOAL #4    Title Pt able to return to work at [employer] with no B knee limitations.    Baseline per pt report, pt unable to return to work until after knee replacements pt states per cardiologist)   Time 4   Period Weeks   Status Not Met               Plan - 05/26/15 1036    Clinical Impression Statement Pt continues to be limited by B knee crepitus and discomfort with closed chain strengthening. Pt ambulates with genu varus and decreased hip extension bilaterally. Pt has a push off step on the R LE in order to avoid pain with stance on L. Pt continues to benefit from strengthening and flexibility. Pt encouraged to make pool workout part of his consistent routine.    Pt will benefit from skilled therapeutic intervention in order to improve on the following deficits Abnormal gait;Decreased activity tolerance;Decreased balance;Decreased mobility;Decreased strength;Hypomobility;Impaired flexibility;Improper body mechanics;Postural dysfunction;Decreased endurance;Decreased range of motion;Difficulty walking;Pain   Rehab Potential Fair   PT Frequency 2x / week   PT Duration 4 weeks   PT Treatment/Interventions ADLs/Self Care Home Management;Cryotherapy;Moist Heat;Balance training;Therapeutic exercise;Therapeutic activities;Functional mobility training;Stair training;Gait training;Patient/family education;Manual techniques   PT Next Visit Plan progressing strengthening/LEFS/gait training    PT Home Exercise Plan continue with current plan. encouraged pool workout    Recommended Other Services aquatic therapy    Consulted and Agree with Plan of Care Patient        Problem List Patient Active Problem List   Diagnosis Date Noted  . Encounter for completion of form with patient 01/12/2015  . Left arm numbness 09/30/2014  . Change in vision 01/09/2014  . Knee pain, bilateral 01/09/2014  . Left knee pain 09/13/2013  . Hyperglycemia 11/05/2012  . Carotid artery stenosis 11/05/2012  .  Hypothyroidism 10/31/2012  . Pure hypercholesterolemia 10/31/2012  . Dysphagia, unspecified(787.20) 10/31/2012  . Coronary atherosclerosis of native coronary artery 10/31/2012  . Anemia 10/31/2012  . Essential hypertension, benign 10/31/2012    Lavone Neri, SPT  05/26/2015, 1:20 PM  [facility] [address] [place], [place], [address] Phone: [phone]   Fax:  [phone]

## 2015-05-27 ENCOUNTER — Other Ambulatory Visit: Payer: Self-pay | Admitting: Internal Medicine

## 2015-05-31 ENCOUNTER — Encounter: Payer: Self-pay | Admitting: Internal Medicine

## 2015-05-31 NOTE — Assessment & Plan Note (Signed)
Cath 11/04/14 revealed three-vessel CAD as outlined.  S/p stent to mid LAD.  On plavix.  Seeing Dr Saralyn Pilar.  Going to rehab.  Doing better.  Follow.

## 2015-05-31 NOTE — Assessment & Plan Note (Signed)
On thyroid replacement.  Follow tsh.  

## 2015-05-31 NOTE — Assessment & Plan Note (Signed)
Low carb diet and exercise.  Follow met b and a1c.  

## 2015-05-31 NOTE — Assessment & Plan Note (Signed)
Blood pressure under good control.  Continue same medication regimen.  Follow pressures.  Follow metabolic panel.   

## 2015-05-31 NOTE — Assessment & Plan Note (Signed)
EGD 12/20/10 as outlined in overview.  Colonoscopy 12/20/10 - diverticulosis.  Follow cbc.

## 2015-05-31 NOTE — Assessment & Plan Note (Signed)
Has OA.  Limits his ability to stand and walk.  Going to rehab for his heart.  Follow.

## 2015-05-31 NOTE — Assessment & Plan Note (Signed)
Low cholesterol diet and exercise.  Follow lipid panel and liver function tests.  

## 2015-06-02 ENCOUNTER — Ambulatory Visit: Payer: Medicare Other | Admitting: Physical Therapy

## 2015-06-02 ENCOUNTER — Encounter: Payer: Self-pay | Admitting: Physical Therapy

## 2015-06-02 DIAGNOSIS — M25562 Pain in left knee: Secondary | ICD-10-CM

## 2015-06-02 DIAGNOSIS — M6281 Muscle weakness (generalized): Secondary | ICD-10-CM

## 2015-06-02 DIAGNOSIS — R262 Difficulty in walking, not elsewhere classified: Secondary | ICD-10-CM | POA: Diagnosis not present

## 2015-06-02 DIAGNOSIS — M25662 Stiffness of left knee, not elsewhere classified: Secondary | ICD-10-CM

## 2015-06-02 DIAGNOSIS — M25561 Pain in right knee: Secondary | ICD-10-CM | POA: Diagnosis not present

## 2015-06-02 DIAGNOSIS — M25661 Stiffness of right knee, not elsewhere classified: Secondary | ICD-10-CM | POA: Diagnosis not present

## 2015-06-02 NOTE — Therapy (Signed)
[facility] [address] [place], [place], [address] Phone: [phone]   Fax:  [phone]  Physical Therapy Treatment  Patient Details  Name: Daniel Austin MRN: [id number] Date of Birth: 03-30-1941 Referring Provider:  Einar Pheasant, MD  Encounter Date: 06/02/2015      PT End of Session - 06/02/15 1526    Visit Number 12   Number of Visits 16   Date for PT Re-Evaluation 06/12/15   Authorization - Visit Number 12   Authorization - Number of Visits 18   PT Start Time 0926   PT Stop Time 1017   PT Time Calculation (min) 51 min   Equipment Utilized During Treatment Gait belt   Activity Tolerance Patient tolerated treatment well;No increased pain   Behavior During Therapy [facility] for tasks assessed/performed      Past Medical History  Diagnosis Date  . Hypercholesterolemia   . Hypothyroidism   . Hypertension   . Fractures, multiple     Metal rod in left foot  . CAD (coronary artery disease)   . Carotid artery stenosis     high-grade symptomatic right sided s/p stent twice  . Syncope     second degree heart block s/p St Jude's pacemaker placement 12/08  . Anemia   . Ulcer of gastric fundus   . Hyperglycemia     Past Surgical History  Procedure Laterality Date  . Pacemaker insertion  08/2007  . Hernia repair      inguinal  . Percutaneous placement intravascular stent cervical carotid artery      twice, right carotid artery  . Heart stint  11/04/2014    [facility]    There were no vitals filed for this visit.  Visit Diagnosis:  Difficulty walking  Knee pain, bilateral  Joint stiffness of knee, left  Stiffness of knee joint, right  Muscle weakness      Subjective Assessment - 06/02/15 1525    Subjective Pt reports L knee pain overnight but no pain currently. Pt ambulates with his SPC and bilateral knee braces. Pt reports no new problems since last tx session.   Patient is accompained by: Family member    Limitations Standing;Walking;Lifting   Diagnostic tests x-ray done   Patient Stated Goals increase leg strength    Currently in Pain? No/denies         OBJECTIVE: There ex: Nustep L8 10 mins (warm-up, no charge). Side step overs on 6" step with one hand light support on // bar. Balance on blue Airex in normal base of support stance throwing and catching off the rebounder.  In [place] Test position, hip flexor stretching. Manual: B LE/lumbar stretching. Patellar mobs B knees all directions grade III mobs for pain control 4 x 20 seconds.   Pt response to Tx for medical necessity: Pt progressing well with balance and gait but limited in overall functional mobility by complaints of knee pain and crepitus.         PT Long Term Goals - 05/26/15 1037    PT LONG TERM GOAL #1   Title Pt will be I with HEP to increase B LE muscle strength 1/2 muscle grade to improve pain free mobility/walking.    Time 4   Period Weeks   Status Partially Met   PT LONG TERM GOAL #2   Title Pt will increase LEFS to >50 out of 80 to improve overall pain free mobility.    Baseline 38 out of 80 7/25;  36/80 on 05/26/15   Time 4   Period Weeks   Status On-going   PT LONG TERM GOAL #3   Title Pt will be able to ambulate with improved recip. gait pattern and use of SPC with decrease c/o pain to improve mobility/return to work.    Time 4   Period Weeks   Status Partially Met   PT LONG TERM GOAL #4   Title Pt able to return to work at [employer] with no B knee limitations.    Baseline per pt report, pt unable to return to work until after knee replacements pt states per cardiologist)   Time 4   Period Weeks   Status Not Met            Plan - 06/02/15 1527    Clinical Impression Statement Pt ambulates with decreased knee and hip extension. Pt is unable to reach terminal knee extension with gait due to genu varus. Pt is limited by hip flexion tightness and hamstring tightness in supine and standing.  Pt continues to progress with aquatic and supine isometrics for strenghtening. Pt is limited by knee crepitus with standing strengthening exercises.    Pt will benefit from skilled therapeutic intervention in order to improve on the following deficits Abnormal gait;Decreased activity tolerance;Decreased balance;Decreased mobility;Decreased strength;Hypomobility;Impaired flexibility;Improper body mechanics;Postural dysfunction;Decreased endurance;Decreased range of motion;Difficulty walking;Pain   Rehab Potential Fair   PT Frequency 2x / week   PT Duration 4 weeks   PT Treatment/Interventions ADLs/Self Care Home Management;Cryotherapy;Moist Heat;Balance training;Therapeutic exercise;Therapeutic activities;Functional mobility training;Stair training;Gait training;Patient/family education;Manual techniques   PT Next Visit Plan progressing strengthening/LEFS/gait training    PT Home Exercise Plan continue with current plan. encouraged pool workout    Recommended Other Services aquatic therapy more consistently    Consulted and Agree with Plan of Care Patient        Problem List Patient Active Problem List   Diagnosis Date Noted  . Encounter for completion of form with patient 01/12/2015  . Left arm numbness 09/30/2014  . Change in vision 01/09/2014  . Knee pain, bilateral 01/09/2014  . Left knee pain 09/13/2013  . Hyperglycemia 11/05/2012  . Carotid artery stenosis 11/05/2012  . Hypothyroidism 10/31/2012  . Pure hypercholesterolemia 10/31/2012  . Dysphagia, unspecified(787.20) 10/31/2012  . Coronary atherosclerosis of native coronary artery 10/31/2012  . Anemia 10/31/2012  . Essential hypertension, benign 10/31/2012   Pura Spice, PT, DPT # [id number]   06/02/2015, 4:48 PM  [facility] [address] [place], [place], [address] Phone: [phone]   Fax:  [phone]

## 2015-06-08 ENCOUNTER — Ambulatory Visit: Payer: Medicare Other | Admitting: Physical Therapy

## 2015-06-08 ENCOUNTER — Encounter: Payer: Self-pay | Admitting: Physical Therapy

## 2015-06-08 DIAGNOSIS — M25662 Stiffness of left knee, not elsewhere classified: Secondary | ICD-10-CM

## 2015-06-08 DIAGNOSIS — M25562 Pain in left knee: Secondary | ICD-10-CM | POA: Diagnosis not present

## 2015-06-08 DIAGNOSIS — R262 Difficulty in walking, not elsewhere classified: Secondary | ICD-10-CM

## 2015-06-08 DIAGNOSIS — M25661 Stiffness of right knee, not elsewhere classified: Secondary | ICD-10-CM

## 2015-06-08 DIAGNOSIS — M6281 Muscle weakness (generalized): Secondary | ICD-10-CM

## 2015-06-08 DIAGNOSIS — M25561 Pain in right knee: Secondary | ICD-10-CM | POA: Diagnosis not present

## 2015-06-08 NOTE — Therapy (Signed)
[facility] [address] [place], [place], [address] Phone: [phone]   Fax:  [phone]  Physical Therapy Treatment  Patient Details  Name: Daniel Austin MRN: [id number] Date of Birth: 27-Mar-1941 Referring Provider:  Isaias Cowman, MD  Encounter Date: 06/08/2015      PT End of Session - 06/08/15 1207    Visit Number 13   Number of Visits 16   Date for PT Re-Evaluation 06/12/15   Authorization - Visit Number 13   Authorization - Number of Visits 18   PT Start Time 0933   PT Stop Time 1497   PT Time Calculation (min) 50 min   Equipment Utilized During Treatment Gait belt   Activity Tolerance Patient tolerated treatment well;No increased pain   Behavior During Therapy [facility] for tasks assessed/performed      Past Medical History  Diagnosis Date  . Hypercholesterolemia   . Hypothyroidism   . Hypertension   . Fractures, multiple     Metal rod in left foot  . CAD (coronary artery disease)   . Carotid artery stenosis     high-grade symptomatic right sided s/p stent twice  . Syncope     second degree heart block s/p St Jude's pacemaker placement 12/08  . Anemia   . Ulcer of gastric fundus   . Hyperglycemia     Past Surgical History  Procedure Laterality Date  . Pacemaker insertion  08/2007  . Hernia repair      inguinal  . Percutaneous placement intravascular stent cervical carotid artery      twice, right carotid artery  . Heart stint  11/04/2014    [facility]    There were no vitals filed for this visit.  Visit Diagnosis:  Difficulty walking  Knee pain, bilateral  Joint stiffness of knee, left  Stiffness of knee joint, right  Muscle weakness      Subjective Assessment - 06/08/15 1203    Subjective Pt reports no new complaints and no pain upon arrival. Pt ambulates with SPC and bilateral knee braces. Pt states he has been in the pool once since last PT tx session.    Patient is accompained  by: Family member   Limitations Standing;Walking;Lifting   Diagnostic tests x-ray done   Patient Stated Goals increase leg strength    Currently in Pain? No/denies        OBJECTIVE: There ex: Nustep L8 10 mins (warm-up, no charge). Squats with chair behind for tactile and visual cue to sit back not have knees over toes x 30 with B UE support. Resisted gait all 4 planes with 2 BTB x 6 each direction. 3" step overs F/B with light touch UE support x 15.  Neuro re-ed: Balance on airex with no UE support. Balance on blue Airex in normal base of support stance throwing and catching off the rebounder x 50 trials. Gait training: Community distance ambulation focusing on reciprocal fluid pattern with SPC. Verbal cuing required to decrease R circumduction.  (genu valgus still noted).   Pt response to Tx for medical necessity: Pt able to handle balance, strength and gait challenges appropriately without c/o increased B knee pain. Pt independent with HEP on land and in water so discharged from skilled PT at this time.           PT Education - 06/08/15 1206    Education provided Yes   Education Details Pt educated on proper squat technique and HEP technique and exercises  reviewed for proper form.    Person(s) Educated Patient   Methods Explanation;Demonstration   Comprehension Verbalized understanding;Returned demonstration             PT Long Term Goals - Jun 10, 2015 1215    PT LONG TERM GOAL #1   Title Pt will be I with HEP to increase B LE muscle strength 1/2 muscle grade to improve pain free mobility/walking.    Baseline B MMT grossly 4+/5   Time 4   Period Weeks   Status Achieved   PT LONG TERM GOAL #2   Title Pt will increase LEFS to >50 out of 80 to improve overall pain free mobility.    Baseline 33/80 June 10, 2023   Time 4   Period Weeks   Status Not Met   PT LONG TERM GOAL #3   Title Pt will be able to ambulate with improved recip. gait pattern and use of SPC with decrease c/o pain to  improve mobility/return to work.    Baseline has reciprocal gait with R circumduction    Time 4   Period Weeks   Status Partially Met   PT LONG TERM GOAL #4   Title Pt able to return to work at [employer] with no B knee limitations.    Baseline per pt report, pt unable to return to work until after knee replacements pt states per cardiologist)   Time 4   Period Weeks   Status Not Met            Plan - 2015-06-10 1207    Clinical Impression Statement Pt is more consistent with a fluid 2 point step through gait pattern. Pt ambulates with circumduction of the R lower extremity. Pt progressed with strengthening to be able to perform 30 standing squats with B UE support. LEFS: 33/80. Pt is able to accept balance challenges on the blue Airex pad and respond with appropiate hip, ankle and knee strategies. Pt educated on importance of staying active and told to call PT if he has any problems. At this time pt is discharged from short term skilled PT.    Pt will benefit from skilled therapeutic intervention in order to improve on the following deficits Abnormal gait;Decreased activity tolerance;Decreased balance;Decreased mobility;Decreased strength;Hypomobility;Impaired flexibility;Improper body mechanics;Postural dysfunction;Decreased endurance;Decreased range of motion;Difficulty walking;Pain   Rehab Potential Fair   PT Frequency 2x / week   PT Duration 4 weeks   PT Treatment/Interventions ADLs/Self Care Home Management;Cryotherapy;Moist Heat;Balance training;Therapeutic exercise;Therapeutic activities;Functional mobility training;Stair training;Gait training;Patient/family education;Manual techniques   PT Home Exercise Plan HEP reivewed encouraged sitting and standing strengthening exercises. Aquatic exercise.    Consulted and Agree with Plan of Care Patient          G-Codes - 06/10/2015 0834    Functional Assessment Tool Used LEFS/ clinical judgement/ pain   Functional Limitation  Mobility: Walking and moving around   Mobility: Walking and Moving Around Current Status [phone]) At least 1 percent but less than 20 percent impaired, limited or restricted   Mobility: Walking and Moving Around Goal Status [phone]) At least 1 percent but less than 20 percent impaired, limited or restricted   Mobility: Walking and Moving Around Discharge Status [phone]) At least 1 percent but less than 20 percent impaired, limited or restricted      Problem List Patient Active Problem List   Diagnosis Date Noted  . Encounter for completion of form with patient 01/12/2015  . Left arm numbness 09/30/2014  . Change in vision 01/09/2014  .  Knee pain, bilateral 01/09/2014  . Left knee pain 09/13/2013  . Hyperglycemia 11/05/2012  . Carotid artery stenosis 11/05/2012  . Hypothyroidism 10/31/2012  . Pure hypercholesterolemia 10/31/2012  . Dysphagia, unspecified(787.20) 10/31/2012  . Coronary atherosclerosis of native coronary artery 10/31/2012  . Anemia 10/31/2012  . Essential hypertension, benign 10/31/2012    Lavone Neri, SPT  06/09/2015, 8:35 AM   [facility] [address] [place], [place], [address] Phone: [phone]   Fax:  [phone]

## 2015-06-14 ENCOUNTER — Ambulatory Visit (INDEPENDENT_AMBULATORY_CARE_PROVIDER_SITE_OTHER): Payer: Medicare Other | Admitting: Podiatry

## 2015-06-14 DIAGNOSIS — B351 Tinea unguium: Secondary | ICD-10-CM | POA: Diagnosis not present

## 2015-06-14 DIAGNOSIS — M79676 Pain in unspecified toe(s): Secondary | ICD-10-CM

## 2015-06-14 NOTE — Progress Notes (Signed)
Patient ID: Daniel Austin, male   DOB: 1941/02/09, 74 y.o.   MRN: [id number]  Subjective: Daniel Austin presents to the office today for painful, elongated nails which he is unable to trim himself. Denies any redness or drainage from the nail sites. Denies any changes since last appointment. No other complaints today. Denies any systemic complaints such as fevers, chills, nausea, vomiting. No calf pain, chest pain, SOB.   Objective: AAO x3, NAD DP/PT pulses palpable 2/4 b/l. CRT < 3sec Protective sensation intact with [name] monofilament Nails dystrophic, brittle, hypertrophic, elongated, yellow discoloration. No surrounding erythema or drainage. There is tenderness to palpation over nails 1-5 bilaterally.  No open lesions or pre-ulcerative lesions. No other areas of tenderness to bilateral lower extremities.  No calf pain, swelling, warmth, erythema.    Assessment: 74 year old male with symptomatic onychomycosis.  Plan: -Treatment options discussed including alternatives, risks, complications. -Nails sharply debrided J88 without complications. -Daily foot inspection discussed with the patient. -Followup in 3 months or sooner if any problems are to arise or any changes symptoms.   Celesta Gentile, DPM

## 2015-06-15 DIAGNOSIS — I998 Other disorder of circulatory system: Secondary | ICD-10-CM | POA: Diagnosis not present

## 2015-06-15 DIAGNOSIS — I6522 Occlusion and stenosis of left carotid artery: Secondary | ICD-10-CM | POA: Diagnosis not present

## 2015-06-15 DIAGNOSIS — I5022 Chronic systolic (congestive) heart failure: Secondary | ICD-10-CM | POA: Diagnosis not present

## 2015-06-15 DIAGNOSIS — I6521 Occlusion and stenosis of right carotid artery: Secondary | ICD-10-CM | POA: Diagnosis not present

## 2015-06-15 DIAGNOSIS — R55 Syncope and collapse: Secondary | ICD-10-CM | POA: Diagnosis not present

## 2015-06-15 DIAGNOSIS — I340 Nonrheumatic mitral (valve) insufficiency: Secondary | ICD-10-CM | POA: Diagnosis not present

## 2015-06-15 DIAGNOSIS — I255 Ischemic cardiomyopathy: Secondary | ICD-10-CM | POA: Diagnosis not present

## 2015-06-15 DIAGNOSIS — I10 Essential (primary) hypertension: Secondary | ICD-10-CM | POA: Diagnosis not present

## 2015-06-15 DIAGNOSIS — I2510 Atherosclerotic heart disease of native coronary artery without angina pectoris: Secondary | ICD-10-CM | POA: Diagnosis not present

## 2015-06-15 DIAGNOSIS — I341 Nonrheumatic mitral (valve) prolapse: Secondary | ICD-10-CM | POA: Diagnosis not present

## 2015-06-15 DIAGNOSIS — Z9889 Other specified postprocedural states: Secondary | ICD-10-CM | POA: Diagnosis not present

## 2015-06-15 DIAGNOSIS — I472 Ventricular tachycardia: Secondary | ICD-10-CM | POA: Diagnosis not present

## 2015-06-22 ENCOUNTER — Encounter: Payer: Self-pay | Admitting: *Deleted

## 2015-06-22 DIAGNOSIS — Z9861 Coronary angioplasty status: Secondary | ICD-10-CM

## 2015-06-22 NOTE — Progress Notes (Signed)
Cardiac Individual Treatment Plan  Patient Details  Name: Daniel Austin MRN: [id number] Date of Birth: [birth year] Referring Jamilah Jean:  Isaias Cowman, MD  Initial Encounter Date:    Visit Diagnosis: S/P PTCA (percutaneous transluminal coronary angioplasty)  Patient's Home Medications on Admission:  Current outpatient prescriptions:  .  Aspirin 81 MG EC tablet, Take 81 mg by mouth daily., Disp: , Rfl:  .  citalopram (CELEXA) 20 MG tablet, TAKE 1 TABLET (20 MG TOTAL) BY MOUTH DAILY., Disp: 30 tablet, Rfl: 5 .  clopidogrel (PLAVIX) 75 MG tablet, TAKE 1 TABLET (75 MG TOTAL) BY MOUTH DAILY., Disp: 30 tablet, Rfl: 5 .  CRESTOR 20 MG tablet, Take 20 mg by mouth daily., Disp: , Rfl: 11 .  fexofenadine (ALLEGRA) 180 MG tablet, Take 180 mg by mouth daily., Disp: , Rfl:  .  levothyroxine (SYNTHROID, LEVOTHROID) 112 MCG tablet, TAKE 1 TABLET (112 MCG TOTAL) BY MOUTH DAILY., Disp: 30 tablet, Rfl: 5 .  levothyroxine (SYNTHROID, LEVOTHROID) 112 MCG tablet, TAKE 1 TABLET BY MOUTH DAILY, Disp: 30 tablet, Rfl: 5 .  lisinopril (PRINIVIL,ZESTRIL) 40 MG tablet, TAKE 1 TABLET (40 MG TOTAL) BY MOUTH DAILY., Disp: 30 tablet, Rfl: 5 .  lisinopril (PRINIVIL,ZESTRIL) 40 MG tablet, TAKE 1 TABLET (40 MG TOTAL) BY MOUTH DAILY., Disp: 30 tablet, Rfl: 5 .  LORazepam (ATIVAN) 1 MG tablet, TAKE 1 TABLET BY MOUTH AT BEDTIME AS NEEDED FOR ANXIETY, Disp: 30 tablet, Rfl: 1 .  metoprolol tartrate (LOPRESSOR) 25 MG tablet, Take 1 tablet (25 mg total) by mouth 2 (two) times daily., Disp: 180 tablet, Rfl: 3 .  naproxen sodium (ANAPROX) 220 MG tablet, Take 220 mg by mouth as needed (knee pain)., Disp: , Rfl:  .  nitroGLYCERIN (NITROSTAT) 0.4 MG SL tablet, Place 0.4 mg under the tongue every 5 (five) minutes as needed. If a third tablet is needed please call 911., Disp: , Rfl:  .  pantoprazole (PROTONIX) 40 MG tablet, TAKE 1 TABLET (40 MG TOTAL) BY MOUTH DAILY., Disp: 30 tablet, Rfl: 5  Past Medical History: Past Medical  History  Diagnosis Date  . Hypercholesterolemia   . Hypothyroidism   . Hypertension   . Fractures, multiple     Metal rod in left foot  . CAD (coronary artery disease)   . Carotid artery stenosis     high-grade symptomatic right sided s/p stent twice  . Syncope     second degree heart block s/p [employer] pacemaker placement 12/08  . Anemia   . Ulcer of gastric fundus   . Hyperglycemia     Tobacco Use: History  Smoking status  . Never Smoker   Smokeless tobacco  . Never Used    Labs: Recent Review Flowsheet Data    Labs for ITP Cardiac and Pulmonary Rehab Latest Ref Rng 12/31/2013 05/25/2014 09/28/2014 01/07/2015 05/23/2015   Cholestrol 0 - 200 mg/dL 154 141 159 130 135   LDLCALC 0 - 99 mg/dL 96 80 87 63 68   HDL >39.00 mg/dL 43.40 44.40 44.90 44.40 49.30   Trlycerides 0.0 - 149.0 mg/dL 71.0 81.0 134.0 115.0 88.0   Hemoglobin A1c 4.6 - 6.5 % 6.1 6.0 6.0 5.9 5.8       Exercise Target Goals:    Exercise Program Goal: Individual exercise prescription set with THRR, safety & activity barriers. Participant demonstrates ability to understand and report RPE using BORG scale, to self-measure pulse accurately, and to acknowledge the importance of the exercise prescription.  Exercise Prescription Goal: Starting with  aerobic activity 30 plus minutes a day, 3 days per week for initial exercise prescription. Provide home exercise prescription and guidelines that participant acknowledges understanding prior to discharge.  Activity Barriers & Risk Stratification:     Activity Barriers & Risk Stratification - 02/13/15 1124    Activity Barriers & Risk Stratification   Activity Barriers Arthritis;Joint Problems;Assistive Device;Other (comment)   Comments 2 canes and knee braces for arthritis in both knees   Risk Stratification High      6 Minute Walk:     6 Minute Walk      01/11/15 1125 03/16/15 0906     6 Minute Walk   Phase Initial Discharge    Distance 418 feet 855 feet     Walk Time 6 minutes 6 minutes    Resting HR 86 bpm 63 bpm    Resting BP 148/94 mmHg 110/80 mmHg    Max Ex. HR 86 bpm 89 bpm    Max Ex. BP 150/100 mmHg 130/70 mmHg    RPE 9 12    Perceived Dyspnea   2    Symptoms Yes (comment) No    Comments NO symptoms   used NUSTEP        Initial Exercise Prescription:   Exercise Prescription Changes:     Exercise Prescription Changes      02/02/15 0800 02/09/15 0800 02/15/15 0700 03/02/15 0800 03/09/15 0800   Exercise Review   Progression Yes Yes Yes Yes Yes   Response to Exercise   Blood Pressure (Admit)   140/84 mmHg  140/84 mmHg   Blood Pressure (Exercise)   126/72 mmHg  126/72 mmHg   Blood Pressure (Exit)   114/80 mmHg  114/80 mmHg   Heart Rate (Admit)   77 bpm  77 bpm   Heart Rate (Exercise)   83 bpm  83 bpm   Heart Rate (Exit)   71 bpm  71 bpm   Rating of Perceived Exertion (Exercise)   12  12   Frequency   Add 1 additional day to program exercise sessions.  Add 1 additional day to program exercise sessions.   Duration   Progress to 50 minutes of aerobic without signs/symptoms of physical distress  Progress to 50 minutes of aerobic without signs/symptoms of physical distress   Intensity   THRR unchanged  THRR unchanged   Progression   Continue progressive overload as per policy without signs/symptoms or physical distress.  Continue progressive overload as per policy without signs/symptoms or physical distress.   Resistance Training   Training Prescription   Yes  Yes   Weight   3  3   Reps   10-12  10-12   Interval Training   Interval Training   Yes  Yes   Equipment   NuStep;Recumbant Bike  NuStep;Recumbant Bike   Recumbant Bike   Level '6  6 7 6   ' RPM 70  70 65 70   Minutes   25  25   NuStep   Level '5  6  8   ' Watts 70  70  115   Minutes   20  20     03/15/15 0700 03/23/15 0800         Exercise Review   Progression Yes Yes      Response to Exercise   Blood Pressure (Admit) 102/70 mmHg 102/70 mmHg      Blood Pressure  (Exercise) 132/64 mmHg 132/64 mmHg      Blood Pressure (Exit) 110/70 mmHg 110/70  mmHg      Heart Rate (Admit) 74 bpm 74 bpm      Heart Rate (Exercise) 96 bpm 96 bpm      Heart Rate (Exit) 79 bpm 79 bpm      Rating of Perceived Exertion (Exercise) 13 13      Frequency --       Duration Progress to 50 minutes of aerobic without signs/symptoms of physical distress Progress to 50 minutes of aerobic without signs/symptoms of physical distress      Intensity THRR unchanged THRR unchanged      Progression Continue progressive overload as per policy without signs/symptoms or physical distress. Continue progressive overload as per policy without signs/symptoms or physical distress.      Resistance Training   Training Prescription Yes Yes      Weight 3 3      Reps 10-15 10-15      Interval Training   Interval Training Yes Yes      Equipment NuStep;Recumbant Bike NuStep;Recumbant Bike      Recumbant Bike   Level 12 13      RPM 50 45      Minutes 25 25      NuStep   Level 8 8      Watts 115 115      Minutes 20 20         Discharge Exercise Prescription (Final Exercise Prescription Changes):     Exercise Prescription Changes - 03/23/15 0800    Exercise Review   Progression Yes   Response to Exercise   Blood Pressure (Admit) 102/70 mmHg   Blood Pressure (Exercise) 132/64 mmHg   Blood Pressure (Exit) 110/70 mmHg   Heart Rate (Admit) 74 bpm   Heart Rate (Exercise) 96 bpm   Heart Rate (Exit) 79 bpm   Rating of Perceived Exertion (Exercise) 13   Duration Progress to 50 minutes of aerobic without signs/symptoms of physical distress   Intensity THRR unchanged   Progression Continue progressive overload as per policy without signs/symptoms or physical distress.   Resistance Training   Training Prescription Yes   Weight 3   Reps 10-15   Interval Training   Interval Training Yes   Equipment NuStep;Recumbant Bike   Recumbant Bike   Level 13   RPM 45   Minutes 25   NuStep   Level 8    Watts 115   Minutes 20      Nutrition:  Target Goals: Understanding of nutrition guidelines, daily intake of sodium <1533m, cholesterol <2021m calories 30% from fat and 7% or less from saturated fats, daily to have 5 or more servings of fruits and vegetables.  Biometrics:     Pre Biometrics - 01/11/15 1127    Pre Biometrics   Height '5\' 8"'  (1.727 m)   Weight 198 lb 14.4 oz (90.22 kg)   Waist Circumference 41 inches   Hip Circumference 43 inches   Waist to Hip Ratio 0.95 %   BMI (Calculated) 30.3       Nutrition Therapy Plan and Nutrition Goals:     Nutrition Therapy & Goals - 03/15/15 1040    Personal Nutrition Goals   Personal Goal #1 KeYvone Neuoesn't feel he needs to meet with the dietician. KeYvone Neueports his wife cooks healthy. He said he cut out salt 10 years ago and uses pepper instead.       Nutrition Discharge: Rate Your Plate Scores:     Rate Your Plate - 0680/88/1100315  Rate Your Plate Scores   Post Score 69   Post Score % 74 %      Nutrition Goals Re-Evaluation:     Nutrition Goals Re-Evaluation      02/14/15 0926           Personal Goal #1 Re-Evaluation   Personal Goal #1 [name] doesn't feel he needs to meet with the dietician. [name] reports his wife cooks healthy. He said he cut out salt 10 years ago and uses pepper instead.        Goal Progress Seen Yes          Psychosocial: Target Goals: Acknowledge presence or absence of depression, maximize coping skills, provide positive support system. Participant is able to verbalize types and ability to use techniques and skills needed for reducing stress and depression.  Initial Review & Psychosocial Screening:   Quality of Life Scores:     Quality of Life - 06/22/15 0700    Quality of Life Scores   Health/Function Pre 15.93 %   Health/Function Post 25 %   Health/Function % Change 59 %   Socioeconomic Pre 25.63 %   Socioeconomic Post 29.69 %   Socioeconomic % Change 15 %   Psych/Spiritual Pre 20.43  %   Psych/Spiritual Post 29.64 %   Psych/Spiritual % Change 45 %   Family Pre 23.9 %   Family Post 30 %   Family % Change 25 %   GLOBAL Pre 20.19 %   GLOBAL Post 27.71 %   GLOBAL % Change 37 %      PHQ-9:     Recent Review Flowsheet Data    Depression screen [facility] 2/9 03/11/2015 01/07/2015 01/04/2014 09/10/2013 09/10/2013   Decreased Interest 0 3 0 0 0   Down, Depressed, Hopeless 0 1 0 1 2   PHQ - 2 Score 0 4 0 1 2   Altered sleeping 0 3 - 0 -   Tired, decreased energy 0 3 - 2 -   Change in appetite 0 2 - 0 -   Feeling bad or failure about yourself  0 3 - 0 -   Trouble concentrating 0 1 - 0 -   Moving slowly or fidgety/restless 0 0 - 0 -   Suicidal thoughts 0 0 - 0 -   PHQ-9 Score 0 16 - 3 -   Difficult doing work/chores Not difficult at all Somewhat difficult - - -      Psychosocial Evaluation and Intervention:     Psychosocial Evaluation - 02/07/15 0959    Psychosocial Evaluation & Interventions   Interventions Stress management education;Relaxation education;Encouraged to exercise with the program and follow exercise prescription   Comments Counselor  met today with Mr. Karlen for initial psychosocial evaluation.  He is a 74 year old hard-working gentleman who has had heart problems since 2008.  He has a strong support system with a spouse of 46 years, an adult daughter who lives close by and is actively involved in his faith community.  Mr. Keeling was walking with (2) knee braces and the use of (2) canes and reports needing "knee Replacement" when he completes this program.  He states he has a history of depression and anxiety symptoms and has been on medications since 2008, which he reports work well for him currently with no current symptoms.  His PHQ-9 was "26" and counselor reviewed that with Mr. Sarr reporting since coming into the program several weeks ago, he is already feeling less hopeless and  less tired or bad about himself.  He also states he is seeing progress in  that he is moving better, knees are stronger, breathing better and generally has more energy.  His goal for this program are to continue this progress and to return to more normal household activities.  He is a very determined individual, so I predict he will work hard and accomplish these goals, in order to be strong enough to have the knee replacement surgeries.  Counselor will continue to follow as needed.     Continued Psychosocial Services Needed Yes  Mr. Flessner will benefit from all of the psychoeducational components of this program as he needs to learn how to manage stress better and the benefit of relaxation.        Psychosocial Re-Evaluation:     Psychosocial Re-Evaluation      06/22/15 0702           Psychosocial Re-Evaluation   Comments Haynes Bast did well with the program. His QOL scores improved and he stated that he feels better than when he started.  He continues to have limitations with his physical problems, but he has improved sice starting the program.           Vocational Rehabilitation: Provide vocational rehab assistance to qualifying candidates.   Vocational Rehab Evaluation & Intervention:     Vocational Rehab - 02/13/15 1125    Initial Vocational Rehab Evaluation & Intervention   Assessment shows need for Vocational Rehabilitation No      Education: Education Goals: Education classes will be provided on a weekly basis, covering required topics. Participant will state understanding/return demonstration of topics presented.  Learning Barriers/Preferences:     Learning Barriers/Preferences - 02/13/15 1124    Learning Barriers/Preferences   Learning Barriers Exercise Concerns   Learning Preferences None      Education Topics: General Nutrition Guidelines/Fats and Fiber: -Group instruction provided by verbal, written material, models and posters to present the general guidelines for heart healthy nutrition. Gives an explanation and review of dietary  fats and fiber.   Controlling Sodium/Reading Food Labels: -Group verbal and written material supporting the discussion of sodium use in heart healthy nutrition. Review and explanation with models, verbal and written materials for utilization of the food label.          Documentation from 06/22/2015 in [facility] Cardiac Rehab   Date  01/31/15   Educator  Jenness Corner   Instruction Review Code  2- meets goals/outcomes      Exercise Physiology & Risk Factors: - Group verbal and written instruction with models to review the exercise physiology of the cardiovascular system and associated critical values. Details cardiovascular disease risk factors and the goals associated with each risk factor.      Documentation from 06/22/2015 in [facility] Cardiac Rehab   Date  02/09/15   Educator  Latimer CEP   Instruction Review Code  2- meets goals/outcomes      Aerobic Exercise & Resistance Training: - Gives group verbal and written discussion on the health impact of inactivity. On the components of aerobic and resistive training programs and the benefits of this training and how to safely progress through these programs.      Documentation from 06/22/2015 in [facility] Cardiac Rehab   Date  02/14/15   Educator  Raechel Ache   Instruction Review Code  2- meets goals/outcomes      Flexibility, Balance, General Exercise Guidelines: - Provides group verbal and written instruction on the  benefits of flexibility and balance training programs. Provides general exercise guidelines with specific guidelines to those with heart or lung disease. Demonstration and skill practice provided.      Documentation from 06/22/2015 in [facility] Cardiac Rehab   Date  02/16/15   Educator  RM   Instruction Review Code  2- meets goals/outcomes      Stress Management: - Provides group verbal and written instruction about the health risks of elevated stress, cause of high stress, and healthy ways to reduce  stress.   Depression: - Provides group verbal and written instruction on the correlation between heart/lung disease and depressed mood, treatment options, and the stigmas associated with seeking treatment.      Documentation from 06/22/2015 in [facility] Cardiac Rehab   Date  03/23/15   Educator  [facility]    Instruction Review Code  2- meets goals/outcomes      Anatomy & Physiology of the Heart: - Group verbal and written instruction and models provide basic cardiac anatomy and physiology, with the coronary electrical and arterial systems. Review of: AMI, Angina, Valve disease, Heart Failure, Cardiac Arrhythmia, Pacemakers, and the ICD.      Documentation from 06/22/2015 in [facility] Cardiac Rehab   Date  02/28/15   Educator  S. Bice   Instruction Review Code  2- meets goals/outcomes      Cardiac Procedures: - Group verbal and written instruction and models to describe the testing methods done to diagnose heart disease. Reviews the outcomes of the test results. Describes the treatment choices: Medical Management, Angioplasty, or Coronary Bypass Surgery.   Cardiac Medications: - Group verbal and written instruction to review commonly prescribed medications for heart disease. Reviews the medication, class of the drug, and side effects. Includes the steps to properly store meds and maintain the prescription regimen.      Documentation from 06/22/2015 in [facility] Cardiac Rehab   Date  02/07/15   Educator  SB   Instruction Review Code  2- meets goals/outcomes      Go Sex-Intimacy & Heart Disease, Get SMART - Goal Setting: - Group verbal and written instruction through game format to discuss heart disease and the return to sexual intimacy. Provides group verbal and written material to discuss and apply goal setting through the application of the S.M.A.R.T. Method.   Other Matters of the Heart: - Provides group verbal, written materials and models to describe Heart Failure, Angina, Valve Disease, and  Diabetes in the realm of heart disease. Includes description of the disease process and treatment options available to the cardiac patient.      Documentation from 06/22/2015 in [facility] Cardiac Rehab   Date  03/09/15   Educator  SB   Instruction Review Code  2- meets goals/outcomes      Exercise & Equipment Safety: - Individual verbal instruction and demonstration of equipment use and safety with use of the equipment.   Infection Prevention: - Provides verbal and written material to individual with discussion of infection control including proper hand washing and proper equipment cleaning during exercise session.   Falls Prevention: - Provides verbal and written material to individual with discussion of falls prevention and safety.   Diabetes: - Individual verbal and written instruction to review signs/symptoms of diabetes, desired ranges of glucose level fasting, after meals and with exercise. Advice that pre and post exercise glucose checks will be done for 3 sessions at entry of program.    Knowledge Questionnaire Score:     Knowledge Questionnaire Score - 03/15/15  1039    Knowledge Questionnaire Score   Pre Score 17/28   Post Score 27      Personal Goals and Risk Factors at Admission:     Personal Goals and Risk Factors at Admission - 02/13/15 1127    Personal Goals and Risk Factors on Admission   Increase Aerobic Exercise and Physical Activity Yes   Intervention While in program, learn and follow the exercise prescription taught. Start at a low level workload and increase workload after able to maintain previous level for 30 minutes. Increase time before increasing intensity.   Understand more about Heart/Pulmonary Disease. Yes   Intervention While in program utilize professionals for any questions, and attend the education sessions. Great websites to use are www.americanheart.org or www.lung.org for reliable information.   Diabetes No   Hypertension Yes   Goal  Participant will see blood pressure controlled within the values of 140/57m/Hg or within value directed by their physician.   Intervention Provide nutrition & aerobic exercise along with prescribed medications to achieve BP 140/90 or less.   Lipids Yes   Goal Cholesterol controlled with medications as prescribed, with individualized exercise RX and with personalized nutrition plan. Value goals: LDL < 781m HDL > 4054mParticipant states understanding of desired cholesterol values and following prescriptions.   Intervention Provide nutrition & aerobic exercise along with prescribed medications to achieve LDL <38m88mDL >40mg40m   Personal Goals and Risk Factors Review:      Goals and Risk Factor Review      02/14/15 0928 02/25/15 1250 06/22/15 0706       Increase Aerobic Exercise and Physical Activity   Goals Progress/Improvement seen  Yes Yes      Comments Is exercising on the Recumbent bike level 5! Ken rYvone Neurts he feels stronger and feels Cardiac Rehab has helped him.  Improving in level and watts on the NS and RB, really enjoying the benefits of exercise and the stamina he has. He feels stronger and more comfortable and stable walking in public than before rehab      Understand more about Heart/Pulmonary Disease   Goals Progress/Improvement seen  Yes       Hypertension   Goal   --  Blood pressure maintained in normal range. KenneBeckheminues to control amount of sodium intake and eats fruit and vegetables everyday .     Abnormal Lipids   Goal   --  LAbs show decrease in the LDL and triglycerides. KenneLequaninues to follow the healthy nutrition giudelines.     Other Goal   Goals Progress/Improvement seen   Yes      Comments  His stability with walking is improving. He has to rely less on his canes and can move more freely and confidently than before rehab. This is the biggest difference he has noticed.          Personal Goals Discharge (Final Personal Goals and Risk Factors  Review):      Goals and Risk Factor Review - 06/22/15 0706    Hypertension   Goal --  Blood pressure maintained in normal range. KenneHannibalinues to control amount of sodium intake and eats fruit and vegetables everyday .   Abnormal Lipids   Goal --  LAbs show decrease in the LDL and triglycerides. KenneOresteinues to follow the healthy nutrition giudelines.       Comments: ITP review and update

## 2015-06-30 DIAGNOSIS — I420 Dilated cardiomyopathy: Secondary | ICD-10-CM | POA: Diagnosis not present

## 2015-07-03 ENCOUNTER — Other Ambulatory Visit: Payer: Self-pay | Admitting: Internal Medicine

## 2015-07-05 ENCOUNTER — Other Ambulatory Visit: Payer: Self-pay | Admitting: Internal Medicine

## 2015-08-24 ENCOUNTER — Other Ambulatory Visit: Payer: Self-pay | Admitting: Internal Medicine

## 2015-08-25 NOTE — Telephone Encounter (Signed)
ok'd refill for lorazepam #30 with one refill.  rx signed and placed on your desk.

## 2015-08-25 NOTE — Telephone Encounter (Signed)
OK to fill? Last Ov 8/16

## 2015-08-25 NOTE — Telephone Encounter (Signed)
Refill faxed

## 2015-08-26 ENCOUNTER — Other Ambulatory Visit: Payer: Self-pay | Admitting: Internal Medicine

## 2015-08-26 ENCOUNTER — Other Ambulatory Visit (INDEPENDENT_AMBULATORY_CARE_PROVIDER_SITE_OTHER): Payer: Medicare Other

## 2015-08-26 ENCOUNTER — Telehealth: Payer: Self-pay | Admitting: *Deleted

## 2015-08-26 DIAGNOSIS — Z125 Encounter for screening for malignant neoplasm of prostate: Secondary | ICD-10-CM | POA: Diagnosis not present

## 2015-08-26 DIAGNOSIS — R739 Hyperglycemia, unspecified: Secondary | ICD-10-CM

## 2015-08-26 DIAGNOSIS — D649 Anemia, unspecified: Secondary | ICD-10-CM | POA: Diagnosis not present

## 2015-08-26 DIAGNOSIS — E7800 Pure hypercholesterolemia, unspecified: Secondary | ICD-10-CM

## 2015-08-26 DIAGNOSIS — I10 Essential (primary) hypertension: Secondary | ICD-10-CM | POA: Diagnosis not present

## 2015-08-26 LAB — LIPID PANEL
CHOLESTEROL: 148 mg/dL (ref 0–200)
HDL: 48.8 mg/dL (ref >39.00)
LDL CALC: 80 mg/dL (ref 0–99)
NonHDL: 99.51
TRIGLYCERIDES: 98 mg/dL (ref 0.0–149.0)
Total CHOL/HDL Ratio: 3
VLDL: 19.6 mg/dL (ref 0.0–40.0)

## 2015-08-26 LAB — CBC WITH DIFFERENTIAL/PLATELET
BASOS PCT: 0.6 % (ref 0.0–3.0)
Basophils Absolute: 0 10*3/uL (ref 0.0–0.1)
EOS PCT: 10 % — AB (ref 0.0–5.0)
Eosinophils Absolute: 0.4 10*3/uL (ref 0.0–0.7)
HEMATOCRIT: 41.4 % (ref 39.0–52.0)
HEMOGLOBIN: 13.6 g/dL (ref 13.0–17.0)
LYMPHS PCT: 29.5 % (ref 12.0–46.0)
Lymphs Abs: 1.3 10*3/uL (ref 0.7–4.0)
MCHC: 32.8 g/dL (ref 30.0–36.0)
MCV: 88.3 fl (ref 78.0–100.0)
MONO ABS: 0.5 10*3/uL (ref 0.1–1.0)
Monocytes Relative: 11.1 % (ref 3.0–12.0)
NEUTROS ABS: 2.1 10*3/uL (ref 1.4–7.7)
Neutrophils Relative %: 48.8 % (ref 43.0–77.0)
Platelets: 265 10*3/uL (ref 150.0–400.0)
RBC: 4.69 Mil/uL (ref 4.22–5.81)
RDW: 13.6 % (ref 11.5–15.5)
WBC: 4.3 10*3/uL (ref 4.0–10.5)

## 2015-08-26 LAB — HEPATIC FUNCTION PANEL
ALT: 9 U/L (ref 0–53)
AST: 15 U/L (ref 0–37)
Albumin: 4.4 g/dL (ref 3.5–5.2)
Alkaline Phosphatase: 104 U/L (ref 39–117)
BILIRUBIN TOTAL: 0.7 mg/dL (ref 0.2–1.2)
Bilirubin, Direct: 0.2 mg/dL (ref 0.0–0.3)
TOTAL PROTEIN: 7.1 g/dL (ref 6.0–8.3)

## 2015-08-26 LAB — BASIC METABOLIC PANEL
BUN: 10 mg/dL (ref 6–23)
CALCIUM: 9.6 mg/dL (ref 8.4–10.5)
CO2: 29 meq/L (ref 19–32)
CREATININE: 0.94 mg/dL (ref 0.40–1.50)
Chloride: 101 mEq/L (ref 96–112)
GFR: 83.09 mL/min (ref >60.00)
GLUCOSE: 101 mg/dL — AB (ref 70–99)
Potassium: 4.5 mEq/L (ref 3.5–5.1)
Sodium: 138 mEq/L (ref 135–145)

## 2015-08-26 LAB — PSA, MEDICARE: PSA: 0.33 ng/ml (ref 0.10–4.00)

## 2015-08-26 LAB — HEMOGLOBIN A1C: HEMOGLOBIN A1C: 5.8 % (ref 4.6–6.5)

## 2015-08-26 NOTE — Telephone Encounter (Signed)
Labs and dx?  

## 2015-08-26 NOTE — Telephone Encounter (Signed)
I accidentally refilled another rx, but it looks like this medication was refill on 08/25/15.  I did not sign the rx.

## 2015-08-26 NOTE — Telephone Encounter (Signed)
Please advise refill? 

## 2015-08-26 NOTE — Telephone Encounter (Signed)
Order placed for labs.

## 2015-08-30 ENCOUNTER — Ambulatory Visit (INDEPENDENT_AMBULATORY_CARE_PROVIDER_SITE_OTHER): Payer: Medicare Other | Admitting: Internal Medicine

## 2015-08-30 ENCOUNTER — Encounter: Payer: Self-pay | Admitting: Internal Medicine

## 2015-08-30 VITALS — BP 100/60 | HR 69 | Temp 97.7°F | Resp 18 | Ht 67.5 in | Wt 200.2 lb

## 2015-08-30 DIAGNOSIS — I6529 Occlusion and stenosis of unspecified carotid artery: Secondary | ICD-10-CM

## 2015-08-30 DIAGNOSIS — I2510 Atherosclerotic heart disease of native coronary artery without angina pectoris: Secondary | ICD-10-CM | POA: Diagnosis not present

## 2015-08-30 DIAGNOSIS — E7800 Pure hypercholesterolemia, unspecified: Secondary | ICD-10-CM

## 2015-08-30 DIAGNOSIS — Z0000 Encounter for general adult medical examination without abnormal findings: Secondary | ICD-10-CM

## 2015-08-30 DIAGNOSIS — M25561 Pain in right knee: Secondary | ICD-10-CM

## 2015-08-30 DIAGNOSIS — R739 Hyperglycemia, unspecified: Secondary | ICD-10-CM

## 2015-08-30 DIAGNOSIS — E039 Hypothyroidism, unspecified: Secondary | ICD-10-CM

## 2015-08-30 DIAGNOSIS — I10 Essential (primary) hypertension: Secondary | ICD-10-CM | POA: Diagnosis not present

## 2015-08-30 DIAGNOSIS — M25562 Pain in left knee: Secondary | ICD-10-CM

## 2015-08-30 DIAGNOSIS — D649 Anemia, unspecified: Secondary | ICD-10-CM

## 2015-08-30 MED ORDER — LISINOPRIL 40 MG PO TABS: ORAL_TABLET | ORAL | Status: DC

## 2015-08-30 NOTE — Progress Notes (Signed)
Pre-visit discussion using our clinic review tool. No additional management support is needed unless otherwise documented below in the visit note.  

## 2015-08-30 NOTE — Progress Notes (Signed)
Patient ID: Daniel Austin, male   DOB: [birth year], 74 y.o.   MRN: [id number]   Subjective:    Patient ID: Daniel Austin, male    DOB: March 11, 1941, 74 y.o.   MRN: [id number]  HPI  Patient with past history of hypercholesterolemia, CAD, carotid artery stenosis, hyperglycemia, hypertension and hypothyroidism.  He comes in today to follow up on these issues as well as for a complete physical exam.  He reports no chest pain or tightness.  No sob.  No acid reflux.  No abdominal pain or cramping.  Bowels stable.  No urinary issues.  Main complaint is that of knee pain.  Limits his activity.  Feels he is handling stress relatively well.     Past Medical History  Diagnosis Date  . Hypercholesterolemia   . Hypothyroidism   . Hypertension   . Fractures, multiple     Metal rod in left foot  . CAD (coronary artery disease)   . Carotid artery stenosis     high-grade symptomatic right sided s/p stent twice  . Syncope     second degree heart block s/p St Jude's pacemaker placement 12/08  . Anemia   . Ulcer of gastric fundus   . Hyperglycemia    Past Surgical History  Procedure Laterality Date  . Pacemaker insertion  08/2007  . Hernia repair      inguinal  . Percutaneous placement intravascular stent cervical carotid artery      twice, right carotid artery  . Heart stint  11/04/2014    [facility]   Family History  Problem Relation Age of Onset  . Heart disease Mother     CHF  . Heart disease Father     CHF  . Thyroid disease Father     hypothyroidism  . Diabetes Sister     diet controlled  . Thyroid disease Sister   . Thyroid disease Sister   . Colon polyps Father   . Colon cancer Neg Hx   . Prostate cancer Neg Hx    Social History   Social History  . Marital Status: Married    Spouse Name: N/A  . Number of Children: 1  . Years of Education: N/A   Social History Main Topics  . Smoking status: Never Smoker   . Smokeless tobacco: Never Used  . Alcohol Use: No  . Drug  Use: No  . Sexual Activity: Not Asked   Other Topics Concern  . None   Social History Narrative    Outpatient Encounter Prescriptions as of 08/30/2015  Medication Sig  . Aspirin 81 MG EC tablet Take 81 mg by mouth daily.  . citalopram (CELEXA) 20 MG tablet TAKE 1 TABLET (20 MG TOTAL) BY MOUTH DAILY.  Marland Kitchen clopidogrel (PLAVIX) 75 MG tablet TAKE 1 TABLET (75 MG TOTAL) BY MOUTH DAILY.  Marland Kitchen CRESTOR 20 MG tablet Take 20 mg by mouth daily.  . fexofenadine (ALLEGRA) 180 MG tablet Take 180 mg by mouth daily.  Marland Kitchen levothyroxine (SYNTHROID, LEVOTHROID) 112 MCG tablet TAKE 1 TABLET (112 MCG TOTAL) BY MOUTH DAILY.  Marland Kitchen lisinopril (PRINIVIL,ZESTRIL) 40 MG tablet TAKE 1 TABLET (40 MG TOTAL) BY MOUTH DAILY.  Marland Kitchen LORazepam (ATIVAN) 1 MG tablet TAKE 1 TABLET BY MOUTH AT BEDTIME AS NEEDED FOR ANXIETY  . metoprolol tartrate (LOPRESSOR) 25 MG tablet Take 1 tablet (25 mg total) by mouth 2 (two) times daily.  . naproxen sodium (ANAPROX) 220 MG tablet Take 220 mg by mouth as needed (knee pain).  Marland Kitchen  nitroGLYCERIN (NITROSTAT) 0.4 MG SL tablet Place 0.4 mg under the tongue every 5 (five) minutes as needed. If a third tablet is needed please call 911.  . pantoprazole (PROTONIX) 40 MG tablet TAKE 1 TABLET (40 MG TOTAL) BY MOUTH DAILY.  . [DISCONTINUED] citalopram (CELEXA) 20 MG tablet TAKE 1 TABLET (20 MG TOTAL) BY MOUTH DAILY.  . [DISCONTINUED] levothyroxine (SYNTHROID, LEVOTHROID) 112 MCG tablet TAKE 1 TABLET BY MOUTH DAILY  . [DISCONTINUED] lisinopril (PRINIVIL,ZESTRIL) 40 MG tablet TAKE 1 TABLET (40 MG TOTAL) BY MOUTH DAILY.  . [DISCONTINUED] lisinopril (PRINIVIL,ZESTRIL) 40 MG tablet TAKE 1 TABLET (40 MG TOTAL) BY MOUTH DAILY.   No facility-administered encounter medications on file as of 08/30/2015.    Review of Systems  Constitutional: Negative for appetite change and unexpected weight change.  HENT: Negative for congestion and sinus pressure.   Eyes: Negative for pain and visual disturbance.  Respiratory: Negative  for cough, chest tightness and shortness of breath.   Cardiovascular: Negative for chest pain, palpitations and leg swelling.  Gastrointestinal: Negative for nausea, vomiting, abdominal pain and diarrhea.  Genitourinary: Negative for dysuria and difficulty urinating.  Musculoskeletal: Negative for back pain.       Bilateral knee pain.    Skin: Negative for color change and rash.  Neurological: Negative for dizziness, light-headedness and headaches.  Hematological: Negative for adenopathy. Does not bruise/bleed easily.  Psychiatric/Behavioral: Negative for dysphoric mood and agitation.       Objective:    Physical Exam  Constitutional: He is oriented to person, place, and time. He appears well-developed and well-nourished. No distress.  HENT:  Head: Normocephalic and atraumatic.  Nose: Nose normal.  Mouth/Throat: Oropharynx is clear and moist. No oropharyngeal exudate.  Eyes: Conjunctivae are normal. Right eye exhibits no discharge. Left eye exhibits no discharge.  Neck: Neck supple. No thyromegaly present.  Cardiovascular: Normal rate and regular rhythm.   Pulmonary/Chest: Breath sounds normal. No respiratory distress. He has no wheezes.  Abdominal: Soft. Bowel sounds are normal. There is no tenderness.  Genitourinary:  Rectal exam - no palpable prostate nodules.  Heme negative.    Musculoskeletal: He exhibits no edema or tenderness.  Lymphadenopathy:    He has no cervical adenopathy.  Neurological: He is alert and oriented to person, place, and time.  Skin: Skin is warm and dry. No rash noted. No erythema.  Psychiatric: He has a normal mood and affect. His behavior is normal.    BP 100/60 mmHg  Pulse 69  Temp(Src) 97.7 F (36.5 C) (Oral)  Resp 18  Ht 5' 7.5" (1.715 m)  Wt 200 lb 4 oz (90.833 kg)  BMI 30.88 kg/m2  SpO2 97% Wt Readings from Last 3 Encounters:  08/30/15 200 lb 4 oz (90.833 kg)  05/25/15 195 lb 4 oz (88.565 kg)  03/09/15 197 lb (89.359 kg)     Lab  Results  Component Value Date   WBC 4.3 08/26/2015   HGB 13.6 08/26/2015   HCT 41.4 08/26/2015   PLT 265.0 08/26/2015   GLUCOSE 101* 08/26/2015   CHOL 148 08/26/2015   TRIG 98.0 08/26/2015   HDL 48.80 08/26/2015   LDLCALC 80 08/26/2015   ALT 9 08/26/2015   AST 15 08/26/2015   NA 138 08/26/2015   K 4.5 08/26/2015   CL 101 08/26/2015   CREATININE 0.94 08/26/2015   BUN 10 08/26/2015   CO2 29 08/26/2015   TSH 1.03 05/23/2015   PSA 0.33 08/26/2015   HGBA1C 5.8 08/26/2015   MICROALBUR 1.6  05/25/2014       Assessment & Plan:   Problem List Items Addressed This Visit    Anemia    EDG and colonoscopy 2012.  Follow cbc.       Carotid artery stenosis    Had high grade stenosis.   S/p stent x 2.  Continues on aspirin and plavix.  Continue f/u with vascular surgery.        Relevant Medications   lisinopril (PRINIVIL,ZESTRIL) 40 MG tablet   Coronary atherosclerosis of native coronary artery    Cath 11/04/14 revealed three vessel CAD as outlined.  S/p stent to mid LAD.  On plavix.   Followed by Dr Saralyn Pilar.  Stable.  Continue medical management.        Relevant Medications   lisinopril (PRINIVIL,ZESTRIL) 40 MG tablet   Essential hypertension, benign - Primary    Blood pressure under good control.  Continue same medication regimen.  Follow pressures.  Follow metabolic panel.        Relevant Medications   lisinopril (PRINIVIL,ZESTRIL) 40 MG tablet   Other Relevant Orders   Basic metabolic panel   Health care maintenance    Physical today 08/29/14.  Follow psa.  PSA 08/26/15 - .33.  Colonoscopy 2012.        Hyperglycemia    Low carb diet and exercise.  Follow met b and a1c.        Relevant Orders   Hemoglobin A1c   Hypothyroidism    On thyroid replacement.  Follow tsh.        Knee pain, bilateral    Has OA.  Limits his ability to stand and walk.  Continue exercise.  Follow.        Pure hypercholesterolemia    Low cholesterol diet and exercise.  On crestor.  Follow  lipid panel  LDL just checked 80.        Relevant Medications   lisinopril (PRINIVIL,ZESTRIL) 40 MG tablet   Other Relevant Orders   Lipid panel   Hepatic function panel       Einar Pheasant, MD

## 2015-09-04 ENCOUNTER — Encounter: Payer: Self-pay | Admitting: Internal Medicine

## 2015-09-04 DIAGNOSIS — Z0000 Encounter for general adult medical examination without abnormal findings: Secondary | ICD-10-CM | POA: Insufficient documentation

## 2015-09-04 NOTE — Assessment & Plan Note (Signed)
Cath 11/04/14 revealed three vessel CAD as outlined.  S/p stent to mid LAD.  On plavix.   Followed by Dr Saralyn Pilar.  Stable.  Continue medical management.

## 2015-09-04 NOTE — Assessment & Plan Note (Signed)
Has OA.  Limits his ability to stand and walk.  Continue exercise.  Follow.

## 2015-09-04 NOTE — Assessment & Plan Note (Addendum)
Physical today 08/29/14.  Follow psa.  PSA 08/26/15 - .33.  Colonoscopy 2012.

## 2015-09-04 NOTE — Assessment & Plan Note (Signed)
Low cholesterol diet and exercise.  On crestor.  Follow lipid panel  LDL just checked 80.

## 2015-09-04 NOTE — Assessment & Plan Note (Signed)
Had high grade stenosis.   S/p stent x 2.  Continues on aspirin and plavix.  Continue f/u with vascular surgery.

## 2015-09-04 NOTE — Assessment & Plan Note (Signed)
EDG and colonoscopy 2012.  Follow cbc.

## 2015-09-04 NOTE — Assessment & Plan Note (Signed)
On thyroid replacement.  Follow tsh.  

## 2015-09-04 NOTE — Assessment & Plan Note (Signed)
Blood pressure under good control.  Continue same medication regimen.  Follow pressures.  Follow metabolic panel.   

## 2015-09-04 NOTE — Assessment & Plan Note (Signed)
Low carb diet and exercise.  Follow met b and a1c.   

## 2015-09-13 ENCOUNTER — Ambulatory Visit (INDEPENDENT_AMBULATORY_CARE_PROVIDER_SITE_OTHER): Payer: Medicare Other | Admitting: Sports Medicine

## 2015-09-13 ENCOUNTER — Encounter: Payer: Self-pay | Admitting: Sports Medicine

## 2015-09-13 DIAGNOSIS — B351 Tinea unguium: Secondary | ICD-10-CM

## 2015-09-13 DIAGNOSIS — M21619 Bunion of unspecified foot: Secondary | ICD-10-CM

## 2015-09-13 DIAGNOSIS — M2040 Other hammer toe(s) (acquired), unspecified foot: Secondary | ICD-10-CM

## 2015-09-13 DIAGNOSIS — M79676 Pain in unspecified toe(s): Secondary | ICD-10-CM

## 2015-09-13 NOTE — Progress Notes (Signed)
Patient ID: AXAVIER PARTRIDGE, male   DOB: 12/30/1940, 74 y.o.   MRN: [id number] Subjective: Daniel Austin is a 74 y.o. male patient seen today in office with complaint of painful thickened and elongated toenails; unable to trim. Patient denies history of Diabetes, Neuropathy, or Known Vascular disease.Admits to stenosis of heart. Patient has no other pedal complaints at this time.   Patient Active Problem List   Diagnosis Date Noted  . Health care maintenance 09/04/2015  . Encounter for completion of form with patient 01/12/2015  . Left arm numbness 09/30/2014  . Change in vision 01/09/2014  . Knee pain, bilateral 01/09/2014  . Left knee pain 09/13/2013  . Hyperglycemia 11/05/2012  . Carotid artery stenosis 11/05/2012  . Hypothyroidism 10/31/2012  . Pure hypercholesterolemia 10/31/2012  . Dysphagia, unspecified(787.20) 10/31/2012  . Coronary atherosclerosis of native coronary artery 10/31/2012  . Anemia 10/31/2012  . Essential hypertension, benign 10/31/2012   Current Outpatient Prescriptions on File Prior to Visit  Medication Sig Dispense Refill  . Aspirin 81 MG EC tablet Take 81 mg by mouth daily.    . citalopram (CELEXA) 20 MG tablet TAKE 1 TABLET (20 MG TOTAL) BY MOUTH DAILY. 30 tablet 5  . clopidogrel (PLAVIX) 75 MG tablet TAKE 1 TABLET (75 MG TOTAL) BY MOUTH DAILY. 30 tablet 5  . CRESTOR 20 MG tablet Take 20 mg by mouth daily.  11  . fexofenadine (ALLEGRA) 180 MG tablet Take 180 mg by mouth daily.    Marland Kitchen levothyroxine (SYNTHROID, LEVOTHROID) 112 MCG tablet TAKE 1 TABLET (112 MCG TOTAL) BY MOUTH DAILY. 30 tablet 5  . lisinopril (PRINIVIL,ZESTRIL) 40 MG tablet TAKE 1 TABLET (40 MG TOTAL) BY MOUTH DAILY. 30 tablet 11  . LORazepam (ATIVAN) 1 MG tablet TAKE 1 TABLET BY MOUTH AT BEDTIME AS NEEDED FOR ANXIETY 30 tablet 1  . metoprolol tartrate (LOPRESSOR) 25 MG tablet Take 1 tablet (25 mg total) by mouth 2 (two) times daily. 180 tablet 3  . naproxen sodium (ANAPROX) 220 MG tablet Take  220 mg by mouth as needed (knee pain).    . nitroGLYCERIN (NITROSTAT) 0.4 MG SL tablet Place 0.4 mg under the tongue every 5 (five) minutes as needed. If a third tablet is needed please call 911.    . pantoprazole (PROTONIX) 40 MG tablet TAKE 1 TABLET (40 MG TOTAL) BY MOUTH DAILY. 30 tablet 5   No current facility-administered medications on file prior to visit.   Allergies  Allergen Reactions  . Beta Adrenergic Blockers Other (See Comments)    Second Degree Heart Block  . Lipitor [Atorvastatin] Other (See Comments)    Myalgia    Objective: Physical Exam  General: Well developed, nourished, no acute distress, awake, alert and oriented x 3  Vascular: Dorsalis pedis artery 1/4 bilateral, Posterior tibial artery 2/4 bilateral, skin temperature warm to warm proximal to distal bilateral lower extremities, no varicosities, pedal hair present bilateral.  Neurological: Gross sensation present via light touch bilateral.   Dermatological: Skin is warm, dry, and supple bilateral, Nails 1-10 are tender, long, thick, and discolored with mild subungal debris, no webspace macerations present bilateral, no open lesions present bilateral, no callus/corns/hyperkeratotic tissue present bilateral. No signs of infection bilateral.  Musculoskeletal:Asymptomatic bunion and hammertoe boney deformities noted bilateral. Muscular strength within normal limits without pain or limitation on range of motion. No pain with calf compression bilateral.  Assessment and Plan:  Problem List Items Addressed This Visit    None    Visit Diagnoses  Dermatophytosis of nail    -  Primary    Pain of toe, unspecified laterality        Bunion        Hammer toe, unspecified laterality          -Examined patient.  -Discussed treatment options for painful mycotic nails. -Mechanically debrided and reduced mycotic nails with sterile nail nipper and dremel nail file without incident. -Recommend good supportive shoes for foot  type daily.  -Patient to return in 3 months for follow up evaluation or sooner if symptoms worsen.  Landis Martins, DPM

## 2015-10-20 DIAGNOSIS — I420 Dilated cardiomyopathy: Secondary | ICD-10-CM | POA: Diagnosis not present

## 2015-11-03 DIAGNOSIS — I5022 Chronic systolic (congestive) heart failure: Secondary | ICD-10-CM | POA: Diagnosis not present

## 2015-11-03 DIAGNOSIS — I472 Ventricular tachycardia: Secondary | ICD-10-CM | POA: Diagnosis not present

## 2015-11-03 DIAGNOSIS — I341 Nonrheumatic mitral (valve) prolapse: Secondary | ICD-10-CM | POA: Diagnosis not present

## 2015-11-03 DIAGNOSIS — I2510 Atherosclerotic heart disease of native coronary artery without angina pectoris: Secondary | ICD-10-CM | POA: Diagnosis not present

## 2015-11-03 DIAGNOSIS — I255 Ischemic cardiomyopathy: Secondary | ICD-10-CM | POA: Diagnosis not present

## 2015-11-03 DIAGNOSIS — I340 Nonrheumatic mitral (valve) insufficiency: Secondary | ICD-10-CM | POA: Diagnosis not present

## 2015-11-03 DIAGNOSIS — E7800 Pure hypercholesterolemia, unspecified: Secondary | ICD-10-CM | POA: Diagnosis not present

## 2015-11-03 DIAGNOSIS — I6521 Occlusion and stenosis of right carotid artery: Secondary | ICD-10-CM | POA: Diagnosis not present

## 2015-11-03 DIAGNOSIS — Z9889 Other specified postprocedural states: Secondary | ICD-10-CM | POA: Diagnosis not present

## 2015-11-03 DIAGNOSIS — I10 Essential (primary) hypertension: Secondary | ICD-10-CM | POA: Diagnosis not present

## 2015-11-06 ENCOUNTER — Other Ambulatory Visit: Payer: Self-pay | Admitting: Internal Medicine

## 2015-11-07 ENCOUNTER — Other Ambulatory Visit: Payer: Self-pay | Admitting: Internal Medicine

## 2015-11-08 DIAGNOSIS — I6523 Occlusion and stenosis of bilateral carotid arteries: Secondary | ICD-10-CM | POA: Diagnosis not present

## 2015-11-08 DIAGNOSIS — E785 Hyperlipidemia, unspecified: Secondary | ICD-10-CM | POA: Diagnosis not present

## 2015-11-08 DIAGNOSIS — I10 Essential (primary) hypertension: Secondary | ICD-10-CM | POA: Diagnosis not present

## 2015-11-09 ENCOUNTER — Encounter
Admission: RE | Admit: 2015-11-09 | Discharge: 2015-11-09 | Disposition: A | Discharge status: Home / Self Care | Payer: Medicare Other | Source: Ambulatory Visit | Attending: Cardiology | Admitting: Cardiology

## 2015-11-09 ENCOUNTER — Ambulatory Visit
Admission: RE | Admit: 2015-11-09 | Discharge: 2015-11-09 | Disposition: A | Discharge status: Home / Self Care | Payer: Medicare Other | Source: Ambulatory Visit | Attending: Cardiology | Admitting: Cardiology

## 2015-11-09 DIAGNOSIS — Z01818 Encounter for other preprocedural examination: Secondary | ICD-10-CM | POA: Diagnosis not present

## 2015-11-09 DIAGNOSIS — Z01811 Encounter for preprocedural respiratory examination: Secondary | ICD-10-CM

## 2015-11-09 DIAGNOSIS — Z01812 Encounter for preprocedural laboratory examination: Secondary | ICD-10-CM | POA: Insufficient documentation

## 2015-11-09 DIAGNOSIS — I441 Atrioventricular block, second degree: Secondary | ICD-10-CM | POA: Insufficient documentation

## 2015-11-09 HISTORY — DX: Gastro-esophageal reflux disease without esophagitis: K21.9

## 2015-11-09 HISTORY — DX: Reserved for inherently not codable concepts without codable children: IMO0001

## 2015-11-09 HISTORY — DX: Unspecified osteoarthritis, unspecified site: M19.90

## 2015-11-09 HISTORY — DX: Pain in unspecified knee: M25.569

## 2015-11-09 HISTORY — DX: Conduction disorder, unspecified: I45.9

## 2015-11-09 LAB — BASIC METABOLIC PANEL
ANION GAP: 7 (ref 5–15)
BUN: 9 mg/dL (ref 6–20)
CALCIUM: 9.3 mg/dL (ref 8.9–10.3)
CO2: 28 mmol/L (ref 22–32)
Chloride: 102 mmol/L (ref 101–111)
Creatinine, Ser: 0.87 mg/dL (ref 0.61–1.24)
GFR calc Af Amer: >60 mL/min (ref >60)
GFR calc non Af Amer: >60 mL/min (ref >60)
GLUCOSE: 103 mg/dL — AB (ref 65–99)
POTASSIUM: 4.5 mmol/L (ref 3.5–5.1)
Sodium: 137 mmol/L (ref 135–145)

## 2015-11-09 LAB — CBC
HEMATOCRIT: 39.9 % — AB (ref 40.0–52.0)
HEMOGLOBIN: 13.1 g/dL (ref 13.0–18.0)
MCH: 28.8 pg (ref 26.0–34.0)
MCHC: 32.9 g/dL (ref 32.0–36.0)
MCV: 87.8 fL (ref 80.0–100.0)
Platelets: 227 10*3/uL (ref 150–440)
RBC: 4.55 MIL/uL (ref 4.40–5.90)
RDW: 13.7 % (ref 11.5–14.5)
WBC: 4.1 10*3/uL (ref 3.8–10.6)

## 2015-11-09 LAB — PROTIME-INR
INR: 1.08
Prothrombin Time: 14.2 seconds (ref 11.4–15.0)

## 2015-11-09 LAB — SURGICAL PCR SCREEN
MRSA, PCR: NEGATIVE
STAPHYLOCOCCUS AUREUS: POSITIVE — AB

## 2015-11-09 LAB — APTT: aPTT: 34 seconds (ref 24–36)

## 2015-11-09 NOTE — Patient Instructions (Signed)
  Your procedure is scheduled on: 11/16/15 Wed Report to Day Surgery.2nd floor medical mall To find out your arrival time please call [phone] between 1PM - 3PM on 11/15/15 Tues.  Remember: Instructions that are not followed completely may result in serious medical risk, up to and including death, or upon the discretion of your surgeon and anesthesiologist your surgery may need to be rescheduled.    _x___ 1. Do not eat food or drink liquids after midnight. No gum chewing or hard candies.     ____ 2. No Alcohol for 24 hours before or after surgery.   ____ 3. Bring all medications with you on the day of surgery if instructed.    _x___ 4. Notify your doctor if there is any change in your medical condition     (cold, fever, infections).     Do not wear jewelry, make-up, hairpins, clips or nail polish.  Do not wear lotions, powders, or perfumes. You may wear deodorant.  Do not shave 48 hours prior to surgery. Men may shave face and neck.  Do not bring valuables to the hospital.    [facility] is not responsible for any belongings or valuables.               Contacts, dentures or bridgework may not be worn into surgery.  Leave your suitcase in the car. After surgery it may be brought to your room.  For patients admitted to the hospital, discharge time is determined by your                treatment team.   Patients discharged the day of surgery will not be allowed to drive home.   Please read over the following fact sheets that you were given:   MRSA Information   _x__ Take these medicines the morning of surgery with A SIP OF WATER:    1. citalopram (CELEXA) 20 MG tablet  2. levothyroxine (SYNTHROID, LEVOTHROID) 112 MCG tablet  3. metoprolol tartrate (LOPRESSOR) 25 MG tablet  4.pantoprazole (PROTONIX) 40 MG tablet  5.  6.  ____ Fleet Enema (as directed)   _x___ Use CHG Soap as directed  ____ Use inhalers on the day of surgery  ____ Stop metformin 2 days prior to  surgery    ____ Take 1/2 of usual insulin dose the night before surgery and none on the morning of surgery.   _x___ Stop Coumadin/Plavix/aspirin on stop aspirin 3 days before surgery and plavix 5 days before surgery  _x___ Stop Anti-inflammatories on stop anaprox and ibuprofen today may take Tylenol as needed   ____ Stop supplements until after surgery.    ____ Bring C-Pap to the hospital.

## 2015-11-16 ENCOUNTER — Ambulatory Visit
Admission: RE | Admit: 2015-11-16 | Discharge: 2015-11-16 | Disposition: A | Discharge status: Home / Self Care | Payer: Medicare Other | Source: Ambulatory Visit | Attending: Cardiology | Admitting: Cardiology

## 2015-11-16 ENCOUNTER — Ambulatory Visit: Payer: Medicare Other | Admitting: Certified Registered"

## 2015-11-16 ENCOUNTER — Encounter: Payer: Self-pay | Admitting: *Deleted

## 2015-11-16 ENCOUNTER — Encounter
Admission: RE | Disposition: A | Discharge status: Home / Self Care | Payer: Self-pay | Source: Ambulatory Visit | Attending: Cardiology

## 2015-11-16 DIAGNOSIS — E079 Disorder of thyroid, unspecified: Secondary | ICD-10-CM | POA: Diagnosis not present

## 2015-11-16 DIAGNOSIS — I110 Hypertensive heart disease with heart failure: Secondary | ICD-10-CM | POA: Diagnosis not present

## 2015-11-16 DIAGNOSIS — Z955 Presence of coronary angioplasty implant and graft: Secondary | ICD-10-CM | POA: Insufficient documentation

## 2015-11-16 DIAGNOSIS — R0602 Shortness of breath: Secondary | ICD-10-CM | POA: Diagnosis not present

## 2015-11-16 DIAGNOSIS — Z8711 Personal history of peptic ulcer disease: Secondary | ICD-10-CM | POA: Insufficient documentation

## 2015-11-16 DIAGNOSIS — R1013 Epigastric pain: Secondary | ICD-10-CM | POA: Insufficient documentation

## 2015-11-16 DIAGNOSIS — I2510 Atherosclerotic heart disease of native coronary artery without angina pectoris: Secondary | ICD-10-CM | POA: Insufficient documentation

## 2015-11-16 DIAGNOSIS — Z809 Family history of malignant neoplasm, unspecified: Secondary | ICD-10-CM | POA: Diagnosis not present

## 2015-11-16 DIAGNOSIS — I441 Atrioventricular block, second degree: Secondary | ICD-10-CM | POA: Diagnosis not present

## 2015-11-16 DIAGNOSIS — Z8249 Family history of ischemic heart disease and other diseases of the circulatory system: Secondary | ICD-10-CM | POA: Insufficient documentation

## 2015-11-16 DIAGNOSIS — I255 Ischemic cardiomyopathy: Secondary | ICD-10-CM | POA: Diagnosis not present

## 2015-11-16 DIAGNOSIS — M25562 Pain in left knee: Secondary | ICD-10-CM | POA: Insufficient documentation

## 2015-11-16 DIAGNOSIS — I259 Chronic ischemic heart disease, unspecified: Secondary | ICD-10-CM | POA: Diagnosis not present

## 2015-11-16 DIAGNOSIS — I6521 Occlusion and stenosis of right carotid artery: Secondary | ICD-10-CM | POA: Diagnosis not present

## 2015-11-16 DIAGNOSIS — I472 Ventricular tachycardia: Secondary | ICD-10-CM | POA: Insufficient documentation

## 2015-11-16 DIAGNOSIS — Z79899 Other long term (current) drug therapy: Secondary | ICD-10-CM | POA: Insufficient documentation

## 2015-11-16 DIAGNOSIS — M25561 Pain in right knee: Secondary | ICD-10-CM | POA: Diagnosis not present

## 2015-11-16 DIAGNOSIS — I340 Nonrheumatic mitral (valve) insufficiency: Secondary | ICD-10-CM | POA: Diagnosis not present

## 2015-11-16 DIAGNOSIS — Z8371 Family history of colonic polyps: Secondary | ICD-10-CM | POA: Diagnosis not present

## 2015-11-16 DIAGNOSIS — E785 Hyperlipidemia, unspecified: Secondary | ICD-10-CM | POA: Insufficient documentation

## 2015-11-16 DIAGNOSIS — E7800 Pure hypercholesterolemia, unspecified: Secondary | ICD-10-CM | POA: Insufficient documentation

## 2015-11-16 DIAGNOSIS — I739 Peripheral vascular disease, unspecified: Secondary | ICD-10-CM | POA: Diagnosis not present

## 2015-11-16 DIAGNOSIS — I341 Nonrheumatic mitral (valve) prolapse: Secondary | ICD-10-CM | POA: Insufficient documentation

## 2015-11-16 DIAGNOSIS — I5022 Chronic systolic (congestive) heart failure: Secondary | ICD-10-CM | POA: Diagnosis not present

## 2015-11-16 DIAGNOSIS — I2584 Coronary atherosclerosis due to calcified coronary lesion: Secondary | ICD-10-CM | POA: Diagnosis not present

## 2015-11-16 DIAGNOSIS — Z951 Presence of aortocoronary bypass graft: Secondary | ICD-10-CM | POA: Diagnosis not present

## 2015-11-16 DIAGNOSIS — Z7982 Long term (current) use of aspirin: Secondary | ICD-10-CM | POA: Diagnosis not present

## 2015-11-16 DIAGNOSIS — Z823 Family history of stroke: Secondary | ICD-10-CM | POA: Diagnosis not present

## 2015-11-16 DIAGNOSIS — Z45018 Encounter for adjustment and management of other part of cardiac pacemaker: Secondary | ICD-10-CM | POA: Diagnosis not present

## 2015-11-16 DIAGNOSIS — T829XXA Unspecified complication of cardiac and vascular prosthetic device, implant and graft, initial encounter: Secondary | ICD-10-CM | POA: Diagnosis not present

## 2015-11-16 HISTORY — PX: PACEMAKER INSERTION: SHX728

## 2015-11-16 SURGERY — INSERTION, CARDIAC PACEMAKER
Anesthesia: General | Wound class: Clean

## 2015-11-16 MED ORDER — CEFAZOLIN SODIUM 1-5 GM-% IV SOLN
INTRAVENOUS | Status: AC
  Filled 2015-11-16: qty 50

## 2015-11-16 MED ORDER — FENTANYL CITRATE (PF) 100 MCG/2ML IJ SOLN: 25.0000 ug | INTRAMUSCULAR | Status: DC | PRN

## 2015-11-16 MED ORDER — GLYCOPYRROLATE 0.2 MG/ML IJ SOLN
INTRAMUSCULAR | Status: DC | PRN
  Administered 2015-11-16: 0.2 mg via INTRAVENOUS

## 2015-11-16 MED ORDER — PROPOFOL 500 MG/50ML IV EMUL
INTRAVENOUS | Status: DC | PRN
  Administered 2015-11-16: 50 ug/kg/min via INTRAVENOUS

## 2015-11-16 MED ORDER — SODIUM CHLORIDE 0.9 % IR SOLN
Status: DC | PRN
  Administered 2015-11-16: 200 mL

## 2015-11-16 MED ORDER — DEXTROSE 5 % IV SOLN: 1000.0000 mg | Freq: Once | INTRAVENOUS | Status: DC

## 2015-11-16 MED ORDER — ONDANSETRON HCL 4 MG/2ML IJ SOLN: 4.0000 mg | Freq: Once | INTRAMUSCULAR | Status: DC | PRN

## 2015-11-16 MED ORDER — MIDAZOLAM HCL 5 MG/5ML IJ SOLN
INTRAMUSCULAR | Status: DC | PRN
  Administered 2015-11-16 (×2): 1 mg via INTRAVENOUS

## 2015-11-16 MED ORDER — LIDOCAINE HCL (CARDIAC) 20 MG/ML IV SOLN
INTRAVENOUS | Status: DC | PRN
  Administered 2015-11-16: 50 mg via INTRAVENOUS

## 2015-11-16 MED ORDER — GENTAMICIN SULFATE 40 MG/ML IJ SOLN: Freq: Once | INTRAMUSCULAR | Status: DC

## 2015-11-16 MED ORDER — LIDOCAINE 1 % OPTIME INJ - NO CHARGE
INTRAMUSCULAR | Status: DC | PRN
  Administered 2015-11-16: 20 mL

## 2015-11-16 MED ORDER — CEFAZOLIN SODIUM 1-5 GM-% IV SOLN
1.0000 g | Freq: Once | INTRAVENOUS | Status: AC
  Administered 2015-11-16: 1 g via INTRAVENOUS

## 2015-11-16 MED ORDER — GENTAMICIN SULFATE 40 MG/ML IJ SOLN
INTRAMUSCULAR | Status: AC
  Filled 2015-11-16: qty 2

## 2015-11-16 MED ORDER — SODIUM CHLORIDE 0.9 % IJ SOLN
INTRAMUSCULAR | Status: AC
  Filled 2015-11-16: qty 50

## 2015-11-16 MED ORDER — KETAMINE HCL 10 MG/ML IJ SOLN
INTRAMUSCULAR | Status: DC | PRN
  Administered 2015-11-16: 20 mg via INTRAVENOUS

## 2015-11-16 MED ORDER — LACTATED RINGERS IV SOLN
INTRAVENOUS | Status: DC
  Administered 2015-11-16: 11:00:00 via INTRAVENOUS

## 2015-11-16 MED ORDER — CEPHALEXIN 250 MG PO CAPS: 250.0000 mg | ORAL_CAPSULE | Freq: Four times a day (QID) | ORAL | Status: DC

## 2015-11-16 MED ORDER — PHENYLEPHRINE HCL 10 MG/ML IJ SOLN
INTRAMUSCULAR | Status: DC | PRN
  Administered 2015-11-16 (×2): 100 ug via INTRAVENOUS

## 2015-11-16 SURGICAL SUPPLY — 32 items
BAG DECANTER FOR FLEXI CONT (MISCELLANEOUS) ×3 IMPLANT
BRUSH SCRUB 4% CHG (MISCELLANEOUS) ×3 IMPLANT
CABLE SURG 12 DISP A/V CHANNEL (MISCELLANEOUS) ×3 IMPLANT
CANISTER SUCT 1200ML W/VALVE (MISCELLANEOUS) ×3 IMPLANT
CHLORAPREP W/TINT 26ML (MISCELLANEOUS) ×3 IMPLANT
COVER LIGHT HANDLE STERIS (MISCELLANEOUS) ×6 IMPLANT
COVER MAYO STAND STRL (DRAPES) ×3 IMPLANT
DRAPE C-ARM XRAY 36X54 (DRAPES) ×3 IMPLANT
DRESSING TELFA 4X3 1S ST N-ADH (GAUZE/BANDAGES/DRESSINGS) ×3 IMPLANT
DRSG TEGADERM 4X4.75 (GAUZE/BANDAGES/DRESSINGS) ×3 IMPLANT
ELECT REM PT RETURN 9FT ADLT (ELECTROSURGICAL) ×3
ELECTRODE REM PT RTRN 9FT ADLT (ELECTROSURGICAL) ×1 IMPLANT
GLOVE BIO SURGEON STRL SZ7.5 (GLOVE) ×3 IMPLANT
GLOVE BIO SURGEON STRL SZ8 (GLOVE) ×3 IMPLANT
GOWN STRL REUS W/ TWL LRG LVL3 (GOWN DISPOSABLE) ×1 IMPLANT
GOWN STRL REUS W/ TWL XL LVL3 (GOWN DISPOSABLE) ×1 IMPLANT
GOWN STRL REUS W/TWL LRG LVL3 (GOWN DISPOSABLE) ×2
GOWN STRL REUS W/TWL XL LVL3 (GOWN DISPOSABLE) ×2
IMMOBILIZER SHDR MD LX WHT (SOFTGOODS) IMPLANT
IMMOBILIZER SHDR XL LX WHT (SOFTGOODS) IMPLANT
INTRO PACEMKR SHEATH II 7FR (MISCELLANEOUS) ×3
INTRODUCER PACEMKR SHTH II 7FR (MISCELLANEOUS) ×1 IMPLANT
IV NS 500ML (IV SOLUTION) ×2
IV NS 500ML BAXH (IV SOLUTION) ×1 IMPLANT
KIT RM TURNOVER STRD PROC AR (KITS) ×3 IMPLANT
LABEL OR SOLS (LABEL) ×3 IMPLANT
MARKER SKIN DUAL TIP RULER LAB (MISCELLANEOUS) ×3 IMPLANT
PACEMAKER ADAPTA DR ADDR01 (Pacemaker) ×1 IMPLANT
PACK PACE INSERTION (MISCELLANEOUS) ×3 IMPLANT
PAD STATPAD (MISCELLANEOUS) ×3 IMPLANT
PPM ADAPTA DR ADDR01 (Pacemaker) ×3 IMPLANT
SUT SILK 0 SH 30 (SUTURE) ×9 IMPLANT

## 2015-11-16 NOTE — Discharge Instructions (Addendum)
Restart plavix 11/18/2015. May remove outer bandage tomorrow 11/17/15. Leave steri-strips intact. May shower Friday. Do not allow water to directly hit incision. Pat dry. Do not lay on the surgical (left) side for 24 hours after surgery. Bedrest with bathroom privileges until tomorrow morning.  Pacemaker Implantation, Care After Refer to this sheet over the next few weeks. These instructions provide you with information on caring for yourself after the procedure. Your health care provider may also give you more specific instructions. Your treatment has been planned according to current medical practices, but problems sometimes occur. Call your health care provider if you have any problems or questions regarding your pacemaker.  WHAT TO EXPECT AFTER THE PROCEDURE  You may feel pain. Some pain is normal. It may last a few days.  A slight bump may be seen over the skin where the device was placed. Sometimes, it is possible to feel the device under the skin. This is normal.  In the months and years afterward, your health care provider will check the device, the leads, and the battery every few months. Eventually, when the battery is low, the device will be replaced. HOME CARE INSTRUCTIONS Medicines  Take medicines only as directed by your health care provider.  If you were prescribed an antibiotic medicine, finish it all even if you start to feel better.  Do not take any other medicines without asking your health care provider first. Some medicines, including certain painkillers, can cause bleeding in your stomach after surgery. Wound Care  Do not remove the bandage on your chest until directed to do so by your health care provider.   After your bandage is removed, you may see pieces of tape called skin adhesive strips over the area where the cut was made (incision site). Let them fall off on their own.  Check the incision site every day to make sure it is not infected, bleeding, or starting  to pull apart.  Do not use lotions or ointments near the incision site unless directed to do so.  Keep the incision area clean and dry for 2-3 days after the procedure or as directed by your health care provider. It takes several weeks for the incision site to completely heal.  Do not take baths, swim, or use a hot tub until your health care provider approves. Activities  Try to walk a little every day. Exercising is important after this procedure. It is also important to use your shoulder on the side of the pacemaker in daily tasks that do not require exaggerated motion.  Avoid sudden jerking, pulling, or chopping movements that pull your upper arm far away from your body for at least 6 weeks.  Do not lift your upper arm above your shoulders for at least 6 weeks. This means no tennis, golf, or swimming for this period of time. If you sleep with the arm above your head, use a restraint to prevent this from happening as you sleep.  You may go back to work when your health care provider says it is okay. Check with your health care provider before you start to drive or play sports. Other Instructions  Follow diet instructions if they were provided. You should be able to eat what you usually do right away, but you may need to limit your salt intake.  Weigh yourself every day. If you suddenly gain weight, fluid may be building up in your body.  Always carry your pacemaker identification card with you. The card should list the  implant date, device model, and manufacturer. Consider wearing a medical alert bracelet or necklace.  Tell all health care providers that you have a pacemaker. This may prevent them from giving you a magnetic resource imaging scan (MRI) because of the strong magnets used during that test.  If you must pass through a metal detector, quickly walk through it. Do not stop under the detector or stand near it.  Avoid places or objects with a strong electric or magnetic field,  including:  Engineer, maintenance. When at the airport, let officials know you have a pacemaker. Your ID card will let you be checked in a way that is safe for you and that will not damage your pacemaker. Also, do not let a security person wave a magnetic wand near your pacemaker. That can make it stop working.  Power plants.  Large electrical generators.  Radiofrequency transmission towers, such as cell phone and radio towers.  Do not use amateur (ham) radio equipment or electric (arc) welding torches. Some devices are safe to use if held at least 1 foot from your pacemaker. These include power tools, lawn mowers, and speakers. If you are unsure of whether something is safe to use, ask your health care provider.  You may safely use electric blankets, heating pads, computers, and microwave ovens.  When using your cell phone, hold it to the ear opposite the pacemaker. Do not leave your cell phone in a pocket over the pacemaker.  Keep all follow-up visits as directed by your health care provider. This is how your health care provider makes sure your chest is healing the way it should. Ask your health care provider when you should come back to have your stitches or staples taken out.  Have your pacemaker checked every 3-6 months or as directed by your health care provider. Most pacemakers last for 4-8 years before a new one is needed. SEEK MEDICAL CARE IF:  You gain weight suddenly.  Your legs or feet swell more than they have before.  It feels like your heart is fluttering or skipping beats (heart palpitations).  You have a fever. SEEK IMMEDIATE MEDICAL CARE IF:  You have chest pain.  You feel more short of breath than you have felt before.  You feel more light-headed than you have felt before.  You have problems with your incision site, such as swelling or bleeding, or it starts to open up.  You have drainage, redness, swelling, or pain at your incision site.   This  information is not intended to replace advice given to you by your health care provider. Make sure you discuss any questions you have with your health care provider.   Document Released: 03/30/2005 Document Revised: 10/01/2014 Document Reviewed: 01/11/2012 Elsevier Interactive Patient Education 2016 [place] Anesthesia, Adult, Care After Refer to this sheet in the next few weeks. These instructions provide you with information on caring for yourself after your procedure. Your health care provider may also give you more specific instructions. Your treatment has been planned according to current medical practices, but problems sometimes occur. Call your health care provider if you have any problems or questions after your procedure. WHAT TO EXPECT AFTER THE PROCEDURE After the procedure, it is typical to experience:  Sleepiness.  Nausea and vomiting. HOME CARE INSTRUCTIONS  For the first 24 hours after general anesthesia:  Have a responsible person with you.  Do not drive a car. If you are alone, do not take public transportation.  Do not drink alcohol.  Do not take medicine that has not been prescribed by your health care provider.  Do not sign important papers or make important decisions.  You may resume a normal diet and activities as directed by your health care provider.  Change bandages (dressings) as directed.  If you have questions or problems that seem related to general anesthesia, call the hospital and ask for the anesthetist or anesthesiologist on call. SEEK MEDICAL CARE IF:  You have nausea and vomiting that continue the day after anesthesia.  You develop a rash. SEEK IMMEDIATE MEDICAL CARE IF:   You have difficulty breathing.  You have chest pain.  You have any allergic problems.   This information is not intended to replace advice given to you by your health care provider. Make sure you discuss any questions you have with your health care  provider.   Document Released: 12/17/2000 Document Revised: 10/01/2014 Document Reviewed: 01/09/2012 Elsevier Interactive Patient Education [employer].

## 2015-11-16 NOTE — H&P (Signed)
Printout Information Document Contents Office Visit Document Received Date 11/16/2015 Document Source Organization [place] Patient Demographics  Reason for Visit  Encounter Details  Last Filed Vital Signs  Functional Status  Instructions  Progress Notes  Plan of Treatment  Procedures  Miscellaneous Results  Visit Diagnoses  Document Information Encounter Summary - Daniel Austin [id number] y.o. Daniel Austin of Feb. 15, 2017 Patient Demographics Patient Address Communication Language Race / Ethnicity  [address] [place], [place] [address]  [phone] [facility]) [phone] (Mobile) [email or web address]  English (Preferred) White / Not Hispanic or Latino  Reason for Visit Reason Comments  Follow-up 6 months  Encounter Details Date Type Department Care Team Description  11/03/2015 Office Visit [facility]  [place], [place] [address]  [phone]  Daniel Austin, Dundee Bagdad  [facility]  [place], [place] [address]  [phone]  [phone] (Fax)  Benign essential hypertension (Primary Dx);Chronic systolic CHF (congestive heart failure) (CMS-HCC);Atherosclerosis of native coronary artery of native heart without angina pectoris;Essential hypertension;Ischemic cardiomyopathy;Mild mitral regurgitation;Mild mitral valve prolapse;Nonsustained ventricular tachycardia (CMS-HCC);Stenosis of right carotid artery;H/O cardiac catheterization;Pure hypercholesterolemia**  Last Filed Vital Signs - in this encounter Vital Sign Reading Time Taken  Blood Pressure 160/80 11/03/2015 10:37 AM EST  Pulse 66 11/03/2015 10:37 AM EST  Temperature - -  Respiratory Rate - -  Oxygen Saturation - -  Inhaled Oxygen Concentration - -  Weight 90.7 kg (200 lb) 11/03/2015 10:37 AM EST  Height 180.3 cm (5\' 11" ) 11/03/2015 10:37 AM EST  Body Mass Index 27.89 11/03/2015 10:37 AM EST  Functional Status - as of this  encounter Functional Status Response Date of Assessment  Are you deaf or do you have serious difficulty hearing? No 11/04/2014  Are you blind or do you have serious difficulty seeing, even when wearing glasses? No 11/04/2014  Do you have serious difficulty walking or climbing stairs? (19 years old or older) Yes 11/04/2014  Do you have difficulty dressing or bathing? (70 years old or older) No 11/04/2014  Because of a physical, mental, or emotional condition, do you have difficulty doing errands alone such as visiting a doctor's office or shopping? (60 years old or older) No 11/04/2014   Cognitive Status Response Date of Assessment  Because of a physical, mental, or emotional condition, do you have serious difficulty concentrating, remembering, or making decisions? (47 years old or older) No 11/04/2014  Instructions - in this encounter Patient Instructions - Daniel Cowman, MD - 11/03/2015 10:30 AM EST DASH Eating Plan DASH stands for "Dietary Approaches to Stop Hypertension." The DASH eating plan is a healthy eating plan that has been shown to reduce high blood pressure (hypertension). Additional health benefits may include reducing the risk of type 2 diabetes mellitus, heart disease, and stroke. The DASH eating plan may also help with weight loss. WHAT DO I NEED TO KNOW ABOUT THE DASH EATING PLAN? For the DASH eating plan, you will follow these general guidelines:  Choose foods with a percent daily value for sodium of less than 5% (as listed on the food label).  Use salt-free seasonings or herbs instead of table salt or sea salt.  Check with your health care provider or pharmacist before using salt substitutes.  Eat lower-sodium products, often labeled as "lower sodium" or "no salt added."  Eat fresh foods.  Eat more vegetables, fruits, and low-fat dairy products.  Choose whole grains. Look for the word "whole" as the first word in the ingredient  list.  Choose fish and skinless  chicken or [place] more often than red meat. Limit fish, poultry, and meat to 6 oz (170 g) each day.  Limit sweets, desserts, sugars, and sugary drinks.  Choose heart-healthy fats.  Limit cheese to 1 oz (28 g) per day.  Eat more home-cooked food and less restaurant, buffet, and fast food.  Limit fried foods.  Cook foods using methods other than frying.  Limit canned vegetables. If you do use them, rinse them well to decrease the sodium.  When eating at a restaurant, ask that your food be prepared with less salt, or no salt if possible. WHAT FOODS CAN I EAT? Seek help from a dietitian for individual calorie needs. Grains Whole grain or whole wheat bread. Brown rice. Whole grain or whole wheat pasta. Quinoa, bulgur, and whole grain cereals. Low-sodium cereals. Corn or whole wheat flour tortillas. Whole grain cornbread. Whole grain crackers. Low-sodium crackers. Vegetables Fresh or frozen vegetables (raw, steamed, roasted, or grilled). Low-sodium or reduced-sodium tomato and vegetable juices. Low-sodium or reduced-sodium tomato sauce and paste. Low-sodium or reduced-sodium canned vegetables.  Fruits All fresh, canned (in natural juice), or frozen fruits. Meat and Other Protein Products Ground beef (85% or leaner), grass-fed beef, or beef trimmed of fat. Skinless chicken or [place]. Ground chicken or [place]. Pork trimmed of fat. All fish and seafood. Eggs. Dried beans, peas, or lentils. Unsalted nuts and seeds. Unsalted canned beans. Dairy Low-fat dairy products, such as skim or 1% milk, 2% or reduced-fat cheeses, low-fat ricotta or cottage cheese, or plain low-fat yogurt. Low-sodium or reduced-sodium cheeses. Fats and Oils Tub margarines without trans fats. Light or reduced-fat mayonnaise and salad dressings (reduced sodium). Avocado. Safflower, olive, or canola oils. Natural peanut or almond butter. Other Unsalted popcorn and pretzels. The items listed above may not be a complete list of  recommended foods or beverages. Contact your dietitian for more options. WHAT FOODS ARE NOT RECOMMENDED? Grains White bread. White pasta. White rice. Refined cornbread. Bagels and croissants. Crackers that contain trans fat. Vegetables Creamed or fried vegetables. Vegetables in a cheese sauce. Regular canned vegetables. Regular canned tomato sauce and paste. Regular tomato and vegetable juices. Fruits Dried fruits. Canned fruit in light or heavy syrup. Fruit juice. Meat and Other Protein Products Fatty cuts of meat. Ribs, chicken wings, bacon, sausage, bologna, salami, chitterlings, fatback, hot dogs, bratwurst, and packaged luncheon meats. Salted nuts and seeds. Canned beans with salt. Dairy Whole or 2% milk, cream, half-and-half, and cream cheese. Whole-fat or sweetened yogurt. Full-fat cheeses or blue cheese. Nondairy creamers and whipped toppings. Processed cheese, cheese spreads, or cheese curds. Condiments Onion and garlic salt, seasoned salt, table salt, and sea salt. Canned and packaged gravies. Worcestershire sauce. Tartar sauce. Barbecue sauce. Teriyaki sauce. Soy sauce, including reduced sodium. Steak sauce. Fish sauce. Oyster sauce. Cocktail sauce. Horseradish. Ketchup and mustard. Meat flavorings and tenderizers. Bouillon cubes. Hot sauce. Tabasco sauce. Marinades. Taco seasonings. Relishes. Fats and Oils Butter, stick margarine, lard, shortening, ghee, and bacon fat. Coconut, palm kernel, or palm oils. Regular salad dressings. Other Pickles and olives. Salted popcorn and pretzels. The items listed above may not be a complete list of foods and beverages to avoid. Contact your dietitian for more information. WHERE CAN I FIND MORE INFORMATION? National Heart, Lung, and Blood Institute: travelstabloid.com  This information is not intended to replace advice given to you by your health care provider. Make sure you discuss any questions you have with your  health care provider.  Document Released: 08/30/2011 Document Revised: 10/01/2014 Document Reviewed: 07/15/2013 Elsevier Interactive Patient Education 2016 [employer].   Progress Notes - in this encounter Daniel Cowman, MD - 11/03/2015 10:30 AM EST Formatting of this note may be different from the original. Established Patient Visit   Chief Complaint: Chief Complaint  Patient presents with  . Follow-up  6 months  Date of Service: 11/03/2015 Date of Birth: [birth year] PCP: Alisa Graff, MD, MD  History of Present Illness: Mr. Malpass is a 75 y.o.male patient who returns for  1. DES x 2 calcified mid LAD at [facility] 11/04/2014 2. Ischemic cardiomyopathy 3. Chronic systolic congestive heart failure 4. Essential hypertension 5. Hyperlipidemia 6. Dual-chamber pacemaker for type 2 second-degree AV block 08/2007 7. Nonsustained ventricular tachycardia 8. Left carotid stent  Patient denies chest pain shortness of breath. He is not experience any palpitations or heart racing. He denies peripheral edema. The patient is not active and does not exercise regularly primarily due to bilateral knee pain. Pacemaker interrogation performed/26/17 revealed the pacemaker was at elective replacement indication.  The patient has essential hypertension, systolic blood pressure elevated today, currently on lisinopril and metoprolol tartrate. The patient follows a low-salt, no added salt diet.  The patient has hyperlipidemia, LDL cholesterol 69 on 11/05/2014, currently on Crestor, which is well tolerated without apparent side effects, followed by his primary care provider. The patient follows a low-cholesterol, low-fat diet.  Past Medical and Surgical History  Past Medical History Past Medical History  Diagnosis Date  . 3-vessel coronary artery disease  With diffuse 80% stenosis distal LAD, 70% stenosis 1st diagonal branch, 80% stenosis RV posterolateral branch and occluded third obtuse marginal  branch by cardiac cath 09/01/07.  . Chronic systolic CHF (congestive heart failure) (CMS-HCC)  . Hyperlipidemia, unspecified  . Hypertension  . Ischemic cardiomyopathy  . Mild mitral regurgitation  . Mild mitral valve prolapse  . Nonsustained ventricular tachycardia (CMS-HCC)  . Stenosis of right carotid artery  high-grade symptomatic s/p stent times two  . Syncope  with second-degree heart block status post [employer] pacemaker placement in 12/08  . Thyroid disease   Past Surgical History He has a past surgical history that includes s/p carotid stent times two; status post [employer]. Jude's pacemaker placement in 12/08; Coronary artery bypass graft; and Inguinal hernia repair.   Medications and Allergies  Current Medications  Current Outpatient Prescriptions  Medication Sig Dispense Refill  . acetaminophen (TYLENOL) 500 MG tablet Take 1,000 mg by mouth as needed for Pain.  Marland Kitchen aspirin 81 MG EC tablet Take 81 mg by mouth once daily.  . citalopram (CELEXA) 20 MG tablet Take 20 mg by mouth once daily.  . clopidogrel (PLAVIX) 75 mg tablet Take 1 tablet (75 mg total) by mouth once daily. 30 tablet 11  . fexofenadine (ALLEGRA) 180 MG tablet Take 180 mg by mouth once daily.  Marland Kitchen levothyroxine (SYNTHROID, LEVOTHROID) 112 MCG tablet Take 112 mcg by mouth once daily. Take on an empty stomach with a glass of water at least 30-60 minutes before breakfast.  . lisinopril (PRINIVIL,ZESTRIL) 40 MG tablet Take 40 mg by mouth once daily.  Marland Kitchen LORazepam (ATIVAN) 1 MG tablet Take 1 mg by mouth nightly as needed for Anxiety.  . metoprolol tartrate (LOPRESSOR) 25 MG tablet Take 25 mg by mouth 2 (two) times daily.  . naproxen sodium (ALEVE) 220 MG tablet Take 440 mg by mouth as needed for Pain.  . niacin-lovastatin (ADVICOR) 500-20 mg 24 hr tablet Take by mouth.  Marland Kitchen  nitroGLYcerin (NITROSTAT) 0.4 MG SL tablet Place 1 tablet (0.4 mg total) under the tongue every 5 (five) minutes as needed for Chest pain. May take up to 3  doses. 25 tablet 11  . pantoprazole (PROTONIX) 40 MG DR tablet Take 40 mg by mouth once daily.  . rosuvastatin (CRESTOR) 20 MG tablet Take 1 tablet (20 mg total) by mouth nightly. 30 tablet 11   No current facility-administered medications for this visit.   Allergies: Beta-blockers (beta-adrenergic blocking agts); Lipitor [atorvastatin]; and Other  Social and Family History  Social History reports that he has never smoked. He has never used smokeless tobacco. He reports that he does not drink alcohol or use illicit drugs.  Family History Family History  Problem Relation Age of Onset  . Hypertension Mother  . Stroke Mother  . Heart failure Mother  . Heart failure Father  . Colon polyps Father  . Cancer Sister   Review of Systems   Review of Systems: The patient denies chest pain, shortness of breath, orthopnea, paroxysmal nocturnal dyspnea, pedal edema, palpitations, heart racing, presyncope, syncope. Review of 12 Systems is negative except as described above.  Physical Examination   Vitals: Visit Vitals  . BP 160/80  . Pulse 66  . Ht 180.3 cm (5\' 11" )  . Wt 90.7 kg (200 lb)  . BMI 27.89 kg/m2   Ht:180.3 cm (5\' 11" ) Wt:90.7 kg (200 lb) [id number] surface area is 2.13 meters squared. Body mass index is 27.89 kg/(m^2).  HEENT: Pupils equally reactive to light and accomodation  Neck: Supple without thyromegaly, carotid pulses 2+ Lungs: clear to auscultation bilaterally; no wheezes, rales, rhonchi Heart: Regular rate and rhythm. No gallops, murmurs or rub Abdomen: soft nontender, nondistended, with normal bowel sounds Extremities: no cyanosis, clubbing, or edema Peripheral Pulses: 2+ in all extremities, 2+ femoral pulses bilaterally  Assessment   75 y.o. male with  1. Benign essential hypertension  2. Chronic systolic CHF (congestive heart failure) (CMS-HCC)  3. Atherosclerosis of native coronary artery of native heart without angina pectoris  4. Essential hypertension    5. Ischemic cardiomyopathy  6. Mild mitral regurgitation  7. Mild mitral valve prolapse  8. Nonsustained ventricular tachycardia (CMS-HCC)  9. Stenosis of right carotid artery  10. H/O cardiac catheterization  11. Pure hypercholesterolemia  12. S/P cardiac catheterization   75 year old gentleman with known coronary artery disease, status post DES in mid LAD, currently without chest pain. Patient has essential hypertension, systolic blood pressure elevated today on current BP medications. Patient has hyperlipidemia with excellent control of LDL cholesterol on Crestor. Recent pacemaker interrogation revealed the pacemaker was at elective replacement indication.  Plan   1. Continue current medications 2. Continue dual antiplatelet therapy uninterrupted for 1 year 3. Counseled patient about low-sodium diet 4. DASH diet printed instructions given to the patient 5. Counseled patient about low-cholesterol diet 6. Continue Crestor for hyperlipidemia management  7. Low-fat and cholesterol diet printed instructions given to the patient 8. Dual-chamber pacemaker generator change out 9. Hold Plavix 5 days prior to change out 10. Return to clinic after change out  No orders of the defined types were placed in this encounter.  Return after ppm change out.  Daniel Cowman, MD    Plan of Treatment - as of this encounter Upcoming Encounters Upcoming Encounters  Date Type Specialty Care Team Description  12/22/2015 Appointment Cardiology Perlie Stene, Sheppard Coil, MD  [place]  [facility]  [place], [place] [address]  [phone]  [phone] (Fax)  Procedures - in this encounter Procedure Name Priority Date/Time Associated Diagnosis Comments  ORDER  11/03/2015 12:00 AM EST  Results for this procedure are in the results section.   Miscellaneous Results - in this encounter   ORDER (11/03/2015) ORDER (11/03/2015)  Narrative  Ordered by an  unspecified provider.   Visit Diagnoses - in this encounter Diagnosis  Benign essential hypertension - Primary  Essential hypertension, benign   Chronic systolic CHF (congestive heart failure) (CMS-HCC)  Atherosclerosis of native coronary artery of native heart without angina pectoris  Essential hypertension  Ischemic cardiomyopathy  Other specified forms of chronic ischemic heart disease   Mild mitral regurgitation  Mild mitral valve prolapse  Nonsustained ventricular tachycardia (CMS-HCC)  Stenosis of right carotid artery  Occlusion and stenosis of carotid artery without mention of cerebral infarction   H/O cardiac catheterization  Pure hypercholesterolemia  S/P cardiac catheterization  Other postprocedural status   Document Information Primary Care Provider Alisa Graff (Jan. 07, 2016 - Present) [phone] (Work) [phone] (Fax)  [id number] [place], [place] [address] Document Coverage Dates Feb. 09, 2017 - Feb. 09, 2017 [place] [phone] (Work) [place], [place] [address] Encounter Providers Daniel Austin (Attending) [phone] (Work) [phone] (Fax)  [place] [facility] [place], [place] [address] Encounter Date Feb. 09, 2017 - Feb. 09, 2017 Printout Information Document Contents Office Visit Document Received Date 11/16/2015 Document Source Organization [place] Patient Demographics  Reason for Visit  Encounter Details  Last Filed Vital Signs  Functional Status  Instructions  Progress Notes  Plan of Treatment  Procedures  Miscellaneous Results  Visit Diagnoses  Document Information Encounter Summary - Daniel Austin [id number] y.o. Daniel Austin of Feb. 15, 2017 Patient Demographics Patient Address Communication Language Race / Ethnicity  [address] [place], [place] [address]  [phone] [facility]) [phone]  (Mobile) [email or web address]  English (Preferred) White / Not Hispanic or Latino  Reason for Visit Reason Comments  Follow-up 6 months  Encounter Details Date Type Department Care Team Description  11/03/2015 Office Visit [facility]  [place], [place] [address]  [phone]  Daniel Cowman, MD  [place]  [facility]  [place], [place] [address]  [phone]  [phone] (Fax)  Benign essential hypertension (Primary Dx);Chronic systolic CHF (congestive heart failure) (CMS-HCC);Atherosclerosis of native coronary artery of native heart without angina pectoris;Essential hypertension;Ischemic cardiomyopathy;Mild mitral regurgitation;Mild mitral valve prolapse;Nonsustained ventricular tachycardia (CMS-HCC);Stenosis of right carotid artery;H/O cardiac catheterization;Pure hypercholesterolemia**  Last Filed Vital Signs - in this encounter Vital Sign Reading Time Taken  Blood Pressure 160/80 11/03/2015 10:37 AM EST  Pulse 66 11/03/2015 10:37 AM EST  Temperature - -  Respiratory Rate - -  Oxygen Saturation - -  Inhaled Oxygen Concentration - -  Weight 90.7 kg (200 lb) 11/03/2015 10:37 AM EST  Height 180.3 cm (5\' 11" ) 11/03/2015 10:37 AM EST  Body Mass Index 27.89 11/03/2015 10:37 AM EST  Functional Status - as of this encounter Functional Status Response Date of Assessment  Are you deaf or do you have serious difficulty hearing? No 11/04/2014  Are you blind or do you have serious difficulty seeing, even when wearing glasses? No 11/04/2014  Do you have serious difficulty walking or climbing stairs? (10 years old or older) Yes 11/04/2014  Do you have difficulty dressing or bathing? (23 years old or older) No 11/04/2014  Because of a physical, mental, or emotional condition, do you have difficulty doing errands  alone such as visiting a doctor's office or shopping? (49 years old or older) No 11/04/2014   Cognitive Status Response  Date of Assessment  Because of a physical, mental, or emotional condition, do you have serious difficulty concentrating, remembering, or making decisions? (74 years old or older) No 11/04/2014  Instructions - in this encounter Patient Instructions - Daniel Cowman, MD - 11/03/2015 10:30 AM EST DASH Eating Plan DASH stands for "Dietary Approaches to Stop Hypertension." The DASH eating plan is a healthy eating plan that has been shown to reduce high blood pressure (hypertension). Additional health benefits may include reducing the risk of type 2 diabetes mellitus, heart disease, and stroke. The DASH eating plan may also help with weight loss. WHAT DO I NEED TO KNOW ABOUT THE DASH EATING PLAN? For the DASH eating plan, you will follow these general guidelines:  Choose foods with a percent daily value for sodium of less than 5% (as listed on the food label).  Use salt-free seasonings or herbs instead of table salt or sea salt.  Check with your health care provider or pharmacist before using salt substitutes.  Eat lower-sodium products, often labeled as "lower sodium" or "no salt added."  Eat fresh foods.  Eat more vegetables, fruits, and low-fat dairy products.  Choose whole grains. Look for the word "whole" as the first word in the ingredient list.  Choose fish and skinless chicken or [place] more often than red meat. Limit fish, poultry, and meat to 6 oz (170 g) each day.  Limit sweets, desserts, sugars, and sugary drinks.  Choose heart-healthy fats.  Limit cheese to 1 oz (28 g) per day.  Eat more home-cooked food and less restaurant, buffet, and fast food.  Limit fried foods.  Cook foods using methods other than frying.  Limit canned vegetables. If you do use them, rinse them well to decrease the sodium.  When eating at a restaurant, ask that your food be prepared with less salt, or no salt if possible. WHAT FOODS CAN I EAT? Seek help from a dietitian for individual  calorie needs. Grains Whole grain or whole wheat bread. Brown rice. Whole grain or whole wheat pasta. Quinoa, bulgur, and whole grain cereals. Low-sodium cereals. Corn or whole wheat flour tortillas. Whole grain cornbread. Whole grain crackers. Low-sodium crackers. Vegetables Fresh or frozen vegetables (raw, steamed, roasted, or grilled). Low-sodium or reduced-sodium tomato and vegetable juices. Low-sodium or reduced-sodium tomato sauce and paste. Low-sodium or reduced-sodium canned vegetables.  Fruits All fresh, canned (in natural juice), or frozen fruits. Meat and Other Protein Products Ground beef (85% or leaner), grass-fed beef, or beef trimmed of fat. Skinless chicken or [place]. Ground chicken or [place]. Pork trimmed of fat. All fish and seafood. Eggs. Dried beans, peas, or lentils. Unsalted nuts and seeds. Unsalted canned beans. Dairy Low-fat dairy products, such as skim or 1% milk, 2% or reduced-fat cheeses, low-fat ricotta or cottage cheese, or plain low-fat yogurt. Low-sodium or reduced-sodium cheeses. Fats and Oils Tub margarines without trans fats. Light or reduced-fat mayonnaise and salad dressings (reduced sodium). Avocado. Safflower, olive, or canola oils. Natural peanut or almond butter. Other Unsalted popcorn and pretzels. The items listed above may not be a complete list of recommended foods or beverages. Contact your dietitian for more options. WHAT FOODS ARE NOT RECOMMENDED? Grains White bread. White pasta. White rice. Refined cornbread. Bagels and croissants. Crackers that contain trans fat. Vegetables Creamed or fried vegetables. Vegetables in a cheese sauce. Regular canned vegetables. Regular canned tomato  sauce and paste. Regular tomato and vegetable juices. Fruits Dried fruits. Canned fruit in light or heavy syrup. Fruit juice. Meat and Other Protein Products Fatty cuts of meat. Ribs, chicken wings, bacon, sausage, bologna, salami, chitterlings, fatback, hot dogs,  bratwurst, and packaged luncheon meats. Salted nuts and seeds. Canned beans with salt. Dairy Whole or 2% milk, cream, half-and-half, and cream cheese. Whole-fat or sweetened yogurt. Full-fat cheeses or blue cheese. Nondairy creamers and whipped toppings. Processed cheese, cheese spreads, or cheese curds. Condiments Onion and garlic salt, seasoned salt, table salt, and sea salt. Canned and packaged gravies. Worcestershire sauce. Tartar sauce. Barbecue sauce. Teriyaki sauce. Soy sauce, including reduced sodium. Steak sauce. Fish sauce. Oyster sauce. Cocktail sauce. Horseradish. Ketchup and mustard. Meat flavorings and tenderizers. Bouillon cubes. Hot sauce. Tabasco sauce. Marinades. Taco seasonings. Relishes. Fats and Oils Butter, stick margarine, lard, shortening, ghee, and bacon fat. Coconut, palm kernel, or palm oils. Regular salad dressings. Other Pickles and olives. Salted popcorn and pretzels. The items listed above may not be a complete list of foods and beverages to avoid. Contact your dietitian for more information. WHERE CAN I FIND MORE INFORMATION? [employer], [employer], [employer]: travelstabloid.com  This information is not intended to replace advice given to you by your health care provider. Make sure you discuss any questions you have with your health care provider.  Document Released: 08/30/2011 Document Revised: 10/01/2014 Document Reviewed: 07/15/2013 Elsevier Interactive Patient Education 2016 [employer].   Progress Notes - in this encounter Daniel Cowman, MD - 11/03/2015 10:30 AM EST Formatting of this note may be different from the original. Established Patient Visit   Chief Complaint: Chief Complaint  Patient presents with  . Follow-up  6 months  Date of Service: 11/03/2015 Date of Birth: 1940/10/07 PCP: Alisa Graff, MD, MD  History of Present Illness: Mr. Binz is a 75 y.o.male patient who returns for  1. DES  x 2 calcified mid LAD at [facility] 11/04/2014 2. Ischemic cardiomyopathy 3. Chronic systolic congestive heart failure 4. Essential hypertension 5. Hyperlipidemia 6. Dual-chamber pacemaker for type 2 second-degree AV block 08/2007 7. Nonsustained ventricular tachycardia 8. Left carotid stent  Patient denies chest pain shortness of breath. He is not experience any palpitations or heart racing. He denies peripheral edema. The patient is not active and does not exercise regularly primarily due to bilateral knee pain. Pacemaker interrogation performed/26/17 revealed the pacemaker was at elective replacement indication.  The patient has essential hypertension, systolic blood pressure elevated today, currently on lisinopril and metoprolol tartrate. The patient follows a low-salt, no added salt diet.  The patient has hyperlipidemia, LDL cholesterol 69 on 11/05/2014, currently on Crestor, which is well tolerated without apparent side effects, followed by his primary care provider. The patient follows a low-cholesterol, low-fat diet.  Past Medical and Surgical History  Past Medical History Past Medical History  Diagnosis Date  . 3-vessel coronary artery disease  With diffuse 80% stenosis distal LAD, 70% stenosis 1st diagonal branch, 80% stenosis RV posterolateral branch and occluded third obtuse marginal branch by cardiac cath 09/01/07.  . Chronic systolic CHF (congestive heart failure) (CMS-HCC)  . Hyperlipidemia, unspecified  . Hypertension  . Ischemic cardiomyopathy  . Mild mitral regurgitation  . Mild mitral valve prolapse  . Nonsustained ventricular tachycardia (CMS-HCC)  . Stenosis of right carotid artery  high-grade symptomatic s/p stent times two  . Syncope  with second-degree heart block status post St. Jude's pacemaker placement in 12/08  . Thyroid disease  Past Surgical History He has a past surgical history that includes s/p carotid stent times two; status post St. Jude's pacemaker  placement in 12/08; Coronary artery bypass graft; and Inguinal hernia repair.   Medications and Allergies  Current Medications  Current Outpatient Prescriptions  Medication Sig Dispense Refill  . acetaminophen (TYLENOL) 500 MG tablet Take 1,000 mg by mouth as needed for Pain.  Marland Kitchen aspirin 81 MG EC tablet Take 81 mg by mouth once daily.  . citalopram (CELEXA) 20 MG tablet Take 20 mg by mouth once daily.  . clopidogrel (PLAVIX) 75 mg tablet Take 1 tablet (75 mg total) by mouth once daily. 30 tablet 11  . fexofenadine (ALLEGRA) 180 MG tablet Take 180 mg by mouth once daily.  Marland Kitchen levothyroxine (SYNTHROID, LEVOTHROID) 112 MCG tablet Take 112 mcg by mouth once daily. Take on an empty stomach with a glass of water at least 30-60 minutes before breakfast.  . lisinopril (PRINIVIL,ZESTRIL) 40 MG tablet Take 40 mg by mouth once daily.  Marland Kitchen LORazepam (ATIVAN) 1 MG tablet Take 1 mg by mouth nightly as needed for Anxiety.  . metoprolol tartrate (LOPRESSOR) 25 MG tablet Take 25 mg by mouth 2 (two) times daily.  . naproxen sodium (ALEVE) 220 MG tablet Take 440 mg by mouth as needed for Pain.  . niacin-lovastatin (ADVICOR) 500-20 mg 24 hr tablet Take by mouth.  . nitroGLYcerin (NITROSTAT) 0.4 MG SL tablet Place 1 tablet (0.4 mg total) under the tongue every 5 (five) minutes as needed for Chest pain. May take up to 3 doses. 25 tablet 11  . pantoprazole (PROTONIX) 40 MG DR tablet Take 40 mg by mouth once daily.  . rosuvastatin (CRESTOR) 20 MG tablet Take 1 tablet (20 mg total) by mouth nightly. 30 tablet 11   No current facility-administered medications for this visit.   Allergies: Beta-blockers (beta-adrenergic blocking agts); Lipitor [atorvastatin]; and Other  Social and Family History  Social History reports that he has never smoked. He has never used smokeless tobacco. He reports that he does not drink alcohol or use illicit drugs.  Family History Family History  Problem Relation Age of Onset  .  Hypertension Mother  . Stroke Mother  . Heart failure Mother  . Heart failure Father  . Colon polyps Father  . Cancer Sister   Review of Systems   Review of Systems: The patient denies chest pain, shortness of breath, orthopnea, paroxysmal nocturnal dyspnea, pedal edema, palpitations, heart racing, presyncope, syncope. Review of 12 Systems is negative except as described above.  Physical Examination   Vitals: Visit Vitals  . BP 160/80  . Pulse 66  . Ht 180.3 cm (5\' 11" )  . Wt 90.7 kg (200 lb)  . BMI 27.89 kg/m2   Ht:180.3 cm (5\' 11" ) Wt:90.7 kg (200 lb) [id number] surface area is 2.13 meters squared. Body mass index is 27.89 kg/(m^2).  HEENT: Pupils equally reactive to light and accomodation  Neck: Supple without thyromegaly, carotid pulses 2+ Lungs: clear to auscultation bilaterally; no wheezes, rales, rhonchi Heart: Regular rate and rhythm. No gallops, murmurs or rub Abdomen: soft nontender, nondistended, with normal bowel sounds Extremities: no cyanosis, clubbing, or edema Peripheral Pulses: 2+ in all extremities, 2+ femoral pulses bilaterally  Assessment   75 y.o. male with  1. Benign essential hypertension  2. Chronic systolic CHF (congestive heart failure) (CMS-HCC)  3. Atherosclerosis of native coronary artery of native heart without angina pectoris  4. Essential hypertension  5. Ischemic cardiomyopathy  6. Mild mitral  regurgitation  7. Mild mitral valve prolapse  8. Nonsustained ventricular tachycardia (CMS-HCC)  9. Stenosis of right carotid artery  10. H/O cardiac catheterization  11. Pure hypercholesterolemia  12. S/P cardiac catheterization   75 year old gentleman with known coronary artery disease, status post DES in mid LAD, currently without chest pain. Patient has essential hypertension, systolic blood pressure elevated today on current BP medications. Patient has hyperlipidemia with excellent control of LDL cholesterol on Crestor. Recent pacemaker  interrogation revealed the pacemaker was at elective replacement indication.  Plan   1. Continue current medications 2. Continue dual antiplatelet therapy uninterrupted for 1 year 3. Counseled patient about low-sodium diet 4. DASH diet printed instructions given to the patient 5. Counseled patient about low-cholesterol diet 6. Continue Crestor for hyperlipidemia management  7. Low-fat and cholesterol diet printed instructions given to the patient 8. Dual-chamber pacemaker generator change out 9. Hold Plavix 5 days prior to change out 10. Return to clinic after change out  No orders of the defined types were placed in this encounter.  Return after ppm change out.  Daniel Cowman, MD    Plan of Treatment - as of this encounter Upcoming Encounters Upcoming Encounters  Date Type Specialty Care Team Description  12/22/2015 Appointment Cardiology Brandin Stetzer, Sheppard Coil, MD  [place]  [facility]  [place], [place] [address]  [phone]  [phone] (Fax)    Procedures - in this encounter Procedure Name Priority Date/Time Associated Diagnosis Comments  ORDER  11/03/2015 12:00 AM EST  Results for this procedure are in the results section.   Miscellaneous Results - in this encounter   ORDER (11/03/2015) ORDER (11/03/2015)  Narrative  Ordered by an unspecified provider.   Visit Diagnoses - in this encounter Diagnosis  Benign essential hypertension - Primary  Essential hypertension, benign   Chronic systolic CHF (congestive heart failure) (CMS-HCC)  Atherosclerosis of native coronary artery of native heart without angina pectoris  Essential hypertension  Ischemic cardiomyopathy  Other specified forms of chronic ischemic heart disease   Mild mitral regurgitation  Mild mitral valve prolapse  Nonsustained ventricular tachycardia (CMS-HCC)  Stenosis of right carotid artery  Occlusion and stenosis of carotid artery without mention of  cerebral infarction   H/O cardiac catheterization  Pure hypercholesterolemia  S/P cardiac catheterization  Other postprocedural status   Document Information Primary Care Provider Alisa Graff (Jan. 07, 2016 - Present) [phone] (Work) [phone] (Fax)  [id number] [place], [place] [address] Document Coverage Dates Feb. 09, 2017 - Feb. 09, 2017 [place] [phone] (Work) [place], [place] [address] Encounter Providers Daniel Austin (Attending) [phone] (Work) [phone] (Fax)  [place] [facility] [place], [place] [address] Encounter Date Feb. 09, 2017 - Feb. 09, 2017

## 2015-11-16 NOTE — Anesthesia Postprocedure Evaluation (Signed)
Anesthesia Post Note  Patient: Daniel Austin  Procedure(s) Performed: Procedure(s) (LRB): INSERTION PACEMAKER/ PACEMAKER CHANGE OUT (N/A)  Patient location during evaluation: PACU Anesthesia Type: General Level of consciousness: awake and alert Pain management: pain level controlled Vital Signs Assessment: post-procedure vital signs reviewed and stable Respiratory status: spontaneous breathing, nonlabored ventilation, respiratory function stable and patient connected to nasal cannula oxygen Cardiovascular status: blood pressure returned to baseline and stable Postop Assessment: no signs of nausea or vomiting Anesthetic complications: no    Last Vitals:  Filed Vitals:   11/16/15 1348 11/16/15 1355  BP: 121/82 141/83  Pulse: 60 64  Temp: 36.1 C   Resp: 13 16    Last Pain: There were no vitals filed for this visit.               Martha Clan

## 2015-11-16 NOTE — Anesthesia Preprocedure Evaluation (Signed)
Anesthesia Evaluation  Patient identified by MRN, date of birth, ID band Patient awake    Reviewed: Allergy & Precautions, H&P , NPO status , Patient's Chart, lab work & pertinent test results, reviewed documented beta blocker date and time   History of Anesthesia Complications Negative for: history of anesthetic complications  Airway Mallampati: III  TM Distance: >3 FB Neck ROM: full    Dental no notable dental hx. (+) Missing, Chipped, Poor Dentition   Pulmonary shortness of breath and with exertion, neg sleep apnea, neg COPD, neg recent URI,    Pulmonary exam normal breath sounds clear to auscultation       Cardiovascular Exercise Tolerance: Good hypertension, (-) angina+ CAD, + Cardiac Stents and + Peripheral Vascular Disease  (-) Past MI and (-) CABG Normal cardiovascular exam+ dysrhythmias (2nd degree heart block) + pacemaker (-) Valvular Problems/Murmurs Rhythm:regular Rate:Normal     Neuro/Psych negative neurological ROS  negative psych ROS   GI/Hepatic Neg liver ROS, PUD, GERD  ,  Endo/Other  neg diabetesHypothyroidism   Renal/GU negative Renal ROS  negative genitourinary   Musculoskeletal   Abdominal   Peds  Hematology negative hematology ROS (+)   Anesthesia Other Findings Past Medical History:   Hypercholesterolemia                                         Hypothyroidism                                               Hypertension                                                 Fractures, multiple                                            Comment:Metal rod in left foot   CAD (coronary artery disease)                                Carotid artery stenosis                                        Comment:high-grade symptomatic right sided s/p stent               twice   Syncope                                                        Comment:second degree heart block s/p St Jude's               pacemaker  placement 12/08   Anemia  Ulcer of gastric fundus                                      Hyperglycemia                                                Heart block                                                  Shortness of breath dyspnea                                  GERD (gastroesophageal reflux disease)                       Arthritis                                                    Knee pain                                                    Reproductive/Obstetrics negative OB ROS                             Anesthesia Physical Anesthesia Plan  ASA: III  Anesthesia Plan: General   Post-op Pain Management:    Induction:   Airway Management Planned:   Additional Equipment:   Intra-op Plan:   Post-operative Plan:   Informed Consent: I have reviewed the patients History and Physical, chart, labs and discussed the procedure including the risks, benefits and alternatives for the proposed anesthesia with the patient or authorized representative who has indicated his/her understanding and acceptance.   Dental Advisory Given  Plan Discussed with: Anesthesiologist, CRNA and Surgeon  Anesthesia Plan Comments:         Anesthesia Quick Evaluation

## 2015-11-16 NOTE — Transfer of Care (Signed)
Immediate Anesthesia Transfer of Care Note  Patient: Daniel Austin  Procedure(s) Performed: Procedure(s): INSERTION PACEMAKER/ PACEMAKER CHANGE OUT (N/A)  Patient Location: PACU  Anesthesia Type:General  Level of Consciousness: awake  Airway & Oxygen Therapy: Patient Spontanous Breathing and Patient connected to face mask oxygen  Post-op Assessment: Report given to RN  Post vital signs: Reviewed  Last Vitals:  Filed Vitals:   11/16/15 1032 11/16/15 1318  BP: 163/80 121/88  Pulse: 66 64  Temp: 36.3 C 36.1 C  Resp: 16 15    Complications: No apparent anesthesia complications

## 2015-11-16 NOTE — Op Note (Signed)
[facility] Cardiology   11/16/2015                     1:10 PM  PATIENT:  Daniel Austin    PRE-OPERATIVE DIAGNOSIS:  type 2 second degree av block,pacemaker eri  POST-OPERATIVE DIAGNOSIS:  Same  PROCEDURE:  INSERTION PACEMAKER/ PACEMAKER CHANGE OUT  SURGEON:  Verdie Barrows, MD    ANESTHESIA:     PREOPERATIVE INDICATIONS:  Daniel Austin is a  75 y.o. male with a diagnosis of type 2 second degree av block,pacemaker eri who failed conservative measures and elected for surgical management.    The risks benefits and alternatives were discussed with the patient preoperatively including but not limited to the risks of infection, bleeding, cardiopulmonary complications, the need for revision surgery, among others, and the patient was willing to proceed.   OPERATIVE PROCEDURE: The patient was brought to the operating room the fasting state. The left pectoral region was prepped and draped in usual sterile manner. Anesthesia was obtained with 1% lidocaine locally. A 6 cm incision was performed a left pectoral region. The old pacemaker generator was retrieved by electrocautery and blunt dissection. The ventricular and atrial leads were disconnected from the old pacemaker generator, interrogated, and connected to a new dual-chamber rate responsive pacemaker generator (Medtronic [id number] ). The pacemaker pocket was irrigated with gentamicin solution. The pocket was closed with 20 and 4-0 Vicryl, respectively. Steri-Strips and a pressure dressing were applied.

## 2015-11-16 NOTE — Interval H&P Note (Signed)
History and Physical Interval Note:  11/16/2015 11:09 AM  Daniel Austin  has presented today for surgery, with the diagnosis of type 2 second degree av block,pacemaker eri  The various methods of treatment have been discussed with the patient and family. After consideration of risks, benefits and other options for treatment, the patient has consented to  Procedure(s): INSERTION PACEMAKER/ PACEMAKER CHANGE OUT (N/A) as a surgical intervention .  The patient's history has been reviewed, patient examined, no change in status, stable for surgery.  I have reviewed the patient's chart and labs.  Questions were answered to the patient's satisfaction.     Fatema Rabe

## 2015-11-24 DIAGNOSIS — I459 Conduction disorder, unspecified: Secondary | ICD-10-CM | POA: Diagnosis not present

## 2015-11-24 DIAGNOSIS — I10 Essential (primary) hypertension: Secondary | ICD-10-CM | POA: Diagnosis not present

## 2015-11-24 DIAGNOSIS — E7800 Pure hypercholesterolemia, unspecified: Secondary | ICD-10-CM | POA: Diagnosis not present

## 2015-11-24 DIAGNOSIS — I341 Nonrheumatic mitral (valve) prolapse: Secondary | ICD-10-CM | POA: Diagnosis not present

## 2015-11-24 DIAGNOSIS — I340 Nonrheumatic mitral (valve) insufficiency: Secondary | ICD-10-CM | POA: Diagnosis not present

## 2015-11-24 DIAGNOSIS — I472 Ventricular tachycardia: Secondary | ICD-10-CM | POA: Diagnosis not present

## 2015-11-24 DIAGNOSIS — I255 Ischemic cardiomyopathy: Secondary | ICD-10-CM | POA: Diagnosis not present

## 2015-11-24 DIAGNOSIS — I6521 Occlusion and stenosis of right carotid artery: Secondary | ICD-10-CM | POA: Diagnosis not present

## 2015-11-24 DIAGNOSIS — Z9889 Other specified postprocedural states: Secondary | ICD-10-CM | POA: Diagnosis not present

## 2015-11-24 DIAGNOSIS — I998 Other disorder of circulatory system: Secondary | ICD-10-CM | POA: Diagnosis not present

## 2015-11-24 DIAGNOSIS — I5022 Chronic systolic (congestive) heart failure: Secondary | ICD-10-CM | POA: Diagnosis not present

## 2015-11-29 ENCOUNTER — Other Ambulatory Visit: Payer: Self-pay | Admitting: Internal Medicine

## 2015-12-10 ENCOUNTER — Other Ambulatory Visit: Payer: Self-pay | Admitting: Internal Medicine

## 2015-12-13 ENCOUNTER — Encounter: Payer: Self-pay | Admitting: Sports Medicine

## 2015-12-13 ENCOUNTER — Ambulatory Visit (INDEPENDENT_AMBULATORY_CARE_PROVIDER_SITE_OTHER): Payer: Medicare Other | Admitting: Sports Medicine

## 2015-12-13 DIAGNOSIS — M79676 Pain in unspecified toe(s): Secondary | ICD-10-CM

## 2015-12-13 DIAGNOSIS — M21619 Bunion of unspecified foot: Secondary | ICD-10-CM | POA: Diagnosis not present

## 2015-12-13 DIAGNOSIS — I5022 Chronic systolic (congestive) heart failure: Secondary | ICD-10-CM | POA: Insufficient documentation

## 2015-12-13 DIAGNOSIS — I255 Ischemic cardiomyopathy: Secondary | ICD-10-CM | POA: Insufficient documentation

## 2015-12-13 DIAGNOSIS — I340 Nonrheumatic mitral (valve) insufficiency: Secondary | ICD-10-CM | POA: Insufficient documentation

## 2015-12-13 DIAGNOSIS — M2040 Other hammer toe(s) (acquired), unspecified foot: Secondary | ICD-10-CM

## 2015-12-13 DIAGNOSIS — I6529 Occlusion and stenosis of unspecified carotid artery: Secondary | ICD-10-CM | POA: Insufficient documentation

## 2015-12-13 DIAGNOSIS — I341 Nonrheumatic mitral (valve) prolapse: Secondary | ICD-10-CM | POA: Insufficient documentation

## 2015-12-13 DIAGNOSIS — E785 Hyperlipidemia, unspecified: Secondary | ICD-10-CM | POA: Insufficient documentation

## 2015-12-13 DIAGNOSIS — B351 Tinea unguium: Secondary | ICD-10-CM

## 2015-12-13 DIAGNOSIS — I10 Essential (primary) hypertension: Secondary | ICD-10-CM | POA: Insufficient documentation

## 2015-12-13 DIAGNOSIS — E079 Disorder of thyroid, unspecified: Secondary | ICD-10-CM | POA: Insufficient documentation

## 2015-12-13 DIAGNOSIS — R55 Syncope and collapse: Secondary | ICD-10-CM | POA: Insufficient documentation

## 2015-12-13 DIAGNOSIS — Z8679 Personal history of other diseases of the circulatory system: Secondary | ICD-10-CM | POA: Insufficient documentation

## 2015-12-13 NOTE — Progress Notes (Signed)
Patient ID: Daniel Austin, male   DOB: 08/15/1941, 74 y.o.   MRN: [id number]  Subjective: Daniel Austin is a 75 y.o. male patient seen today in office with complaint of painful thickened and elongated toenails; unable to trim. Patient denies history of Diabetes or Neuropathy. Admits to stenosis of heart; had a pacemaker placed. Patient has no other pedal complaints at this time.   Patient Active Problem List   Diagnosis Date Noted  . Chronic systolic heart failure ([place]) 12/13/2015  . HLD (hyperlipidemia) 12/13/2015  . BP (high blood pressure) 12/13/2015  . Cardiomyopathy, ischemic 12/13/2015  . MI (mitral incompetence) 12/13/2015  . Billowing mitral valve 12/13/2015  . Nonsustained ventricular tachycardia ([place]) 12/13/2015  . Carotid stenosis 12/13/2015  . Episode of syncope 12/13/2015  . Disease of thyroid gland 12/13/2015  . Health care maintenance 09/04/2015  . Encounter for completion of form with patient 01/12/2015  . Left arm numbness 09/30/2014  . Disturbance of skin sensation 09/30/2014  . Ischemia of upper extremity 09/30/2014  . Pins and needles sensation 09/30/2014  . Unspecified visual disturbance 09/30/2014  . Primary osteoarthritis of both knees 09/29/2014  . History of cardiac catheterization 05/12/2014  . Change in vision 01/09/2014  . Knee pain, bilateral 01/09/2014  . Arthralgia of lower leg 01/09/2014  . Left knee pain 09/13/2013  . Hyperglycemia 11/05/2012  . Carotid artery stenosis 11/05/2012  . Carotid artery narrowing 11/05/2012  . Carotid artery obstruction 11/05/2012  . Abnormal blood sugar 11/05/2012  . Hypothyroidism 10/31/2012  . Pure hypercholesterolemia 10/31/2012  . Dysphagia, unspecified(787.20) 10/31/2012  . Coronary atherosclerosis of native coronary artery 10/31/2012  . Anemia 10/31/2012  . Essential hypertension, benign 10/31/2012  . Absolute anemia 10/31/2012  . Benign essential HTN 10/31/2012  . Can't get food down 10/31/2012  .  Adult hypothyroidism 10/31/2012   Current Outpatient Prescriptions on File Prior to Visit  Medication Sig Dispense Refill  . Aspirin 81 MG EC tablet Take 81 mg by mouth daily.    . cephALEXin (KEFLEX) 250 MG capsule Take 1 capsule (250 mg total) by mouth 4 (four) times daily. 28 capsule 0  . citalopram (CELEXA) 20 MG tablet TAKE 1 TABLET (20 MG TOTAL) BY MOUTH DAILY. 30 tablet 5  . clopidogrel (PLAVIX) 75 MG tablet TAKE 1 TABLET BY MOUTH DAILY 30 tablet 5  . CRESTOR 20 MG tablet TAKE 1 TABLET (20 MG TOTAL) BY MOUTH NIGHTLY. 30 tablet 11  . fexofenadine (ALLEGRA) 180 MG tablet Take 180 mg by mouth daily.    Marland Kitchen levothyroxine (SYNTHROID, LEVOTHROID) 112 MCG tablet TAKE 1 TABLET (112 MCG TOTAL) BY MOUTH DAILY. 30 tablet 5  . lisinopril (PRINIVIL,ZESTRIL) 40 MG tablet TAKE 1 TABLET (40 MG TOTAL) BY MOUTH DAILY. (Patient taking differently: Take 40 mg by mouth at bedtime. TAKE 1 TABLET (40 MG TOTAL) BY MOUTH DAILY.) 30 tablet 11  . LORazepam (ATIVAN) 1 MG tablet TAKE 1 TABLET BY MOUTH AT BEDTIME AS NEEDED FOR ANXIETY 30 tablet 1  . metoprolol tartrate (LOPRESSOR) 25 MG tablet TAKE 1 TABLET BY MOUTH TWICE A DAY 180 tablet 3  . naproxen sodium (ANAPROX) 220 MG tablet Take 220 mg by mouth as needed (knee pain).    . nitroGLYCERIN (NITROSTAT) 0.4 MG SL tablet Place 0.4 mg under the tongue every 5 (five) minutes as needed. If a third tablet is needed please call 911.    . pantoprazole (PROTONIX) 40 MG tablet TAKE 1 TABLET (40 MG TOTAL) BY MOUTH DAILY. 30 tablet  5   No current facility-administered medications on file prior to visit.   Allergies  Allergen Reactions  . Beta Adrenergic Blockers Other (See Comments)    Second Degree Heart Block  . Lipitor [Atorvastatin] Other (See Comments)    Myalgia Other Reaction: myalgia Myalgia    Objective: Physical Exam  General: Well developed, nourished, no acute distress, awake, alert and oriented x 3  Vascular: Dorsalis pedis artery 1/4 bilateral,  Posterior tibial artery 2/4 bilateral, skin temperature warm to warm proximal to distal bilateral lower extremities, no varicosities, pedal hair present bilateral.  Neurological: Gross sensation present via light touch bilateral.   Dermatological: Skin is warm, dry, and supple bilateral, Nails 1-10 are tender, long, thick, and discolored with mild subungal debris, no webspace macerations present bilateral, no open lesions present bilateral, no callus/corns/hyperkeratotic tissue present bilateral. No signs of infection bilateral.  Musculoskeletal:Asymptomatic bunion and hammertoe boney deformities noted bilateral. Muscular strength within normal limits without pain or limitation on range of motion. No pain with calf compression bilateral.  Assessment and Plan:  Problem List Items Addressed This Visit    None    Visit Diagnoses    Dermatophytosis of nail    -  Primary    Pain of toe, unspecified laterality        Bunion        Hammer toe, unspecified laterality          -Examined patient.  -Discussed treatment options for painful mycotic nails. -Mechanically debrided and reduced mycotic nails with sterile nail nipper and dremel nail file without incident. -Recommend good supportive shoes for foot type daily.  -Patient to return in 3 months for follow up evaluation or sooner if symptoms worsen.  Landis Martins, DPM

## 2015-12-22 DIAGNOSIS — I420 Dilated cardiomyopathy: Secondary | ICD-10-CM | POA: Diagnosis not present

## 2015-12-22 DIAGNOSIS — M170 Bilateral primary osteoarthritis of knee: Secondary | ICD-10-CM | POA: Diagnosis not present

## 2015-12-22 DIAGNOSIS — M25562 Pain in left knee: Secondary | ICD-10-CM | POA: Diagnosis not present

## 2015-12-22 DIAGNOSIS — M25561 Pain in right knee: Secondary | ICD-10-CM | POA: Diagnosis not present

## 2015-12-27 ENCOUNTER — Other Ambulatory Visit (INDEPENDENT_AMBULATORY_CARE_PROVIDER_SITE_OTHER): Payer: Medicare Other

## 2015-12-27 DIAGNOSIS — I10 Essential (primary) hypertension: Secondary | ICD-10-CM | POA: Diagnosis not present

## 2015-12-27 DIAGNOSIS — E7800 Pure hypercholesterolemia, unspecified: Secondary | ICD-10-CM | POA: Diagnosis not present

## 2015-12-27 DIAGNOSIS — R739 Hyperglycemia, unspecified: Secondary | ICD-10-CM | POA: Diagnosis not present

## 2015-12-27 LAB — HEPATIC FUNCTION PANEL
ALBUMIN: 4.2 g/dL (ref 3.5–5.2)
ALK PHOS: 90 U/L (ref 39–117)
ALT: 9 U/L (ref 0–53)
AST: 17 U/L (ref 0–37)
BILIRUBIN TOTAL: 0.8 mg/dL (ref 0.2–1.2)
Bilirubin, Direct: 0.2 mg/dL (ref 0.0–0.3)
Total Protein: 7.1 g/dL (ref 6.0–8.3)

## 2015-12-27 LAB — BASIC METABOLIC PANEL
BUN: 10 mg/dL (ref 6–23)
CHLORIDE: 99 meq/L (ref 96–112)
CO2: 32 meq/L (ref 19–32)
CREATININE: 0.88 mg/dL (ref 0.40–1.50)
Calcium: 9.4 mg/dL (ref 8.4–10.5)
GFR: 89.58 mL/min (ref >60.00)
Glucose, Bld: 93 mg/dL (ref 70–99)
POTASSIUM: 4.4 meq/L (ref 3.5–5.1)
Sodium: 135 mEq/L (ref 135–145)

## 2015-12-27 LAB — LIPID PANEL
CHOL/HDL RATIO: 3
Cholesterol: 126 mg/dL (ref 0–200)
HDL: 41.3 mg/dL (ref >39.00)
LDL Cholesterol: 61 mg/dL (ref 0–99)
NONHDL: 84.64
Triglycerides: 118 mg/dL (ref 0.0–149.0)
VLDL: 23.6 mg/dL (ref 0.0–40.0)

## 2015-12-27 LAB — HEMOGLOBIN A1C: HEMOGLOBIN A1C: 6 % (ref 4.6–6.5)

## 2015-12-28 ENCOUNTER — Other Ambulatory Visit: Payer: Self-pay | Admitting: Internal Medicine

## 2015-12-29 ENCOUNTER — Ambulatory Visit (INDEPENDENT_AMBULATORY_CARE_PROVIDER_SITE_OTHER): Payer: Medicare Other | Admitting: Internal Medicine

## 2015-12-29 ENCOUNTER — Encounter: Payer: Self-pay | Admitting: Internal Medicine

## 2015-12-29 VITALS — BP 120/80 | HR 90 | Temp 97.6°F | Resp 18 | Ht 67.5 in | Wt 197.2 lb

## 2015-12-29 DIAGNOSIS — R739 Hyperglycemia, unspecified: Secondary | ICD-10-CM

## 2015-12-29 DIAGNOSIS — I2510 Atherosclerotic heart disease of native coronary artery without angina pectoris: Secondary | ICD-10-CM | POA: Diagnosis not present

## 2015-12-29 DIAGNOSIS — I10 Essential (primary) hypertension: Secondary | ICD-10-CM | POA: Diagnosis not present

## 2015-12-29 DIAGNOSIS — E039 Hypothyroidism, unspecified: Secondary | ICD-10-CM

## 2015-12-29 DIAGNOSIS — I6529 Occlusion and stenosis of unspecified carotid artery: Secondary | ICD-10-CM

## 2015-12-29 DIAGNOSIS — E7800 Pure hypercholesterolemia, unspecified: Secondary | ICD-10-CM

## 2015-12-29 DIAGNOSIS — M170 Bilateral primary osteoarthritis of knee: Secondary | ICD-10-CM

## 2015-12-29 MED ORDER — CITALOPRAM HYDROBROMIDE 20 MG PO TABS: ORAL_TABLET | ORAL | Status: DC

## 2015-12-29 MED ORDER — LORAZEPAM 1 MG PO TABS: ORAL_TABLET | ORAL | Status: DC

## 2015-12-29 MED ORDER — PANTOPRAZOLE SODIUM 40 MG PO TBEC: DELAYED_RELEASE_TABLET | ORAL | Status: DC

## 2015-12-29 NOTE — Progress Notes (Signed)
Patient ID: Daniel Austin, male   DOB: 07-Nov-1940, 75 y.o.   MRN: [id number]   Subjective:    Patient ID: Daniel Austin, male    DOB: [birth year], 75 y.o.   MRN: [id number]  HPI  Patient here for a scheduled follow up.  Is followed by Dr Saralyn Pilar.  Just had pacemaker changed.  No chest pain or tightness.  No sob.  Main complaint is that of knee pain.  Pain in both knees.  Has seen Dr Marry Guan.  States planning to have knee surgery.  Will do right first.  No nausea or vomiting.  Bowels stable.     Past Medical History  Diagnosis Date  . Hypercholesterolemia   . Hypothyroidism   . Hypertension   . Fractures, multiple     Metal rod in left foot  . CAD (coronary artery disease)   . Carotid artery stenosis     high-grade symptomatic right sided s/p stent twice  . Syncope     second degree heart block s/p St Jude's pacemaker placement 12/08  . Anemia   . Ulcer of gastric fundus   . Hyperglycemia   . Heart block   . Shortness of breath dyspnea   . GERD (gastroesophageal reflux disease)   . Arthritis   . Knee pain    Past Surgical History  Procedure Laterality Date  . Pacemaker insertion  08/2007  . Hernia repair      inguinal  . Percutaneous placement intravascular stent cervical carotid artery      twice, right carotid artery  . Heart stint  11/04/2014    [facility]  . Pacemaker insertion N/A 11/16/2015    Procedure: INSERTION PACEMAKER/ PACEMAKER CHANGE OUT;  Surgeon: Isaias Cowman, MD;  Location: [facility] ORS;  Service: Cardiovascular;  Laterality: N/A;   Family History  Problem Relation Age of Onset  . Heart disease Mother     CHF  . Heart disease Father     CHF  . Thyroid disease Father     hypothyroidism  . Diabetes Sister     diet controlled  . Thyroid disease Sister   . Thyroid disease Sister   . Colon polyps Father   . Colon cancer Neg Hx   . Prostate cancer Neg Hx    Social History   Social History  . Marital Status: Married    Spouse Name: N/A    . Number of Children: 1  . Years of Education: N/A   Social History Main Topics  . Smoking status: Never Smoker   . Smokeless tobacco: Never Used  . Alcohol Use: No  . Drug Use: No  . Sexual Activity: Not Asked   Other Topics Concern  . None   Social History Narrative    Outpatient Encounter Prescriptions as of 12/29/2015  Medication Sig  . Aspirin 81 MG EC tablet Take 81 mg by mouth daily.  . citalopram (CELEXA) 20 MG tablet TAKE 1 TABLET (20 MG TOTAL) BY MOUTH DAILY.  Marland Kitchen clopidogrel (PLAVIX) 75 MG tablet TAKE 1 TABLET BY MOUTH DAILY  . CRESTOR 20 MG tablet TAKE 1 TABLET (20 MG TOTAL) BY MOUTH NIGHTLY.  . fexofenadine (ALLEGRA) 180 MG tablet Take 180 mg by mouth daily.  Marland Kitchen levothyroxine (SYNTHROID, LEVOTHROID) 112 MCG tablet TAKE 1 TABLET (112 MCG TOTAL) BY MOUTH DAILY.  Marland Kitchen lisinopril (PRINIVIL,ZESTRIL) 40 MG tablet TAKE 1 TABLET (40 MG TOTAL) BY MOUTH DAILY. (Patient taking differently: Take 40 mg by mouth at bedtime. TAKE 1 TABLET (  40 MG TOTAL) BY MOUTH DAILY.)  . LORazepam (ATIVAN) 1 MG tablet TAKE 1 TABLET BY MOUTH AT BEDTIME AS NEEDED FOR ANXIETY  . metoprolol tartrate (LOPRESSOR) 25 MG tablet TAKE 1 TABLET BY MOUTH TWICE A DAY  . naproxen sodium (ANAPROX) 220 MG tablet Take 220 mg by mouth as needed (knee pain).  . niacin-lovastatin (ADVICOR) 500-20 MG 24 hr tablet Take by mouth.  . nitroGLYCERIN (NITROSTAT) 0.4 MG SL tablet Place 0.4 mg under the tongue every 5 (five) minutes as needed. If a third tablet is needed please call 911.  . pantoprazole (PROTONIX) 40 MG tablet TAKE 1 TABLET (40 MG TOTAL) BY MOUTH DAILY.  . [DISCONTINUED] citalopram (CELEXA) 20 MG tablet TAKE 1 TABLET (20 MG TOTAL) BY MOUTH DAILY.  . [DISCONTINUED] LORazepam (ATIVAN) 1 MG tablet TAKE 1 TABLET BY MOUTH AT BEDTIME AS NEEDED FOR ANXIETY  . [DISCONTINUED] pantoprazole (PROTONIX) 40 MG tablet TAKE 1 TABLET (40 MG TOTAL) BY MOUTH DAILY.  . [DISCONTINUED] cephALEXin (KEFLEX) 250 MG capsule Take 1 capsule (250  mg total) by mouth 4 (four) times daily.   No facility-administered encounter medications on file as of 12/29/2015.    Review of Systems  Constitutional: Negative for appetite change and unexpected weight change.  HENT: Negative for congestion and sinus pressure.   Respiratory: Negative for cough, chest tightness and shortness of breath.   Cardiovascular: Negative for chest pain, palpitations and leg swelling.  Gastrointestinal: Negative for nausea, vomiting, abdominal pain and diarrhea.  Genitourinary: Negative for dysuria and difficulty urinating.  Musculoskeletal: Negative for back pain.       Bilateral knee pain as outlined.   Skin: Negative for color change and rash.  Neurological: Negative for dizziness, light-headedness and headaches.  Psychiatric/Behavioral: Negative for dysphoric mood and agitation.       Objective:    Physical Exam  Constitutional: He appears well-developed and well-nourished. No distress.  HENT:  Nose: Nose normal.  Mouth/Throat: Oropharynx is clear and moist.  Neck: Neck supple. No thyromegaly present.  Cardiovascular: Normal rate and regular rhythm.   Pulmonary/Chest: Effort normal and breath sounds normal. No respiratory distress.  Abdominal: Soft. Bowel sounds are normal. There is no tenderness.  Musculoskeletal: He exhibits no edema or tenderness.  Lymphadenopathy:    He has no cervical adenopathy.  Skin: No rash noted. No erythema.  Psychiatric: He has a normal mood and affect. His behavior is normal.    BP 120/80 mmHg  Pulse 90  Temp(Src) 97.6 F (36.4 C) (Oral)  Resp 18  Ht 5' 7.5" (1.715 m)  Wt 197 lb 4 oz (89.472 kg)  BMI 30.42 kg/m2  SpO2 95% Wt Readings from Last 3 Encounters:  12/29/15 197 lb 4 oz (89.472 kg)  11/09/15 198 lb (89.812 kg)  08/30/15 200 lb 4 oz (90.833 kg)     Lab Results  Component Value Date   WBC 4.1 11/09/2015   HGB 13.1 11/09/2015   HCT 39.9* 11/09/2015   PLT 227 11/09/2015   GLUCOSE 93 12/27/2015     CHOL 126 12/27/2015   TRIG 118.0 12/27/2015   HDL 41.30 12/27/2015   LDLCALC 61 12/27/2015   ALT 9 12/27/2015   AST 17 12/27/2015   NA 135 12/27/2015   K 4.4 12/27/2015   CL 99 12/27/2015   CREATININE 0.88 12/27/2015   BUN 10 12/27/2015   CO2 32 12/27/2015   TSH 1.03 05/23/2015   PSA 0.33 08/26/2015   INR 1.08 11/09/2015   HGBA1C 6.0 12/27/2015  MICROALBUR 1.6 05/25/2014    Dg Chest 2 View  11/09/2015  CLINICAL DATA:  Pre-op for pacemaker battery replacement. Nonsmoker. EXAM: CHEST  2 VIEW COMPARISON:  Portable chest 12/06/2006. FINDINGS: Left subclavian pacemaker leads are present within the right atrium and right ventricle. The heart size and mediastinal contours are stable. There is aortic atherosclerosis and coronary artery stents. The lungs are clear. There is no pleural effusion or pneumothorax. Old rib fractures are present bilaterally. IMPRESSION: No active cardiopulmonary process. Electronically Signed   By: Richardean Sale M.D.   On: 11/09/2015 14:05       Assessment & Plan:   Problem List Items Addressed This Visit    Carotid artery stenosis    High grade stenosis.  S/p stent x 2.  Continues on apsirin and plavix.  Continue f/u with vascular surgery.        Coronary atherosclerosis of native coronary artery    On plavix.  Seeing cardiology.  Stable.        Essential hypertension, benign - Primary    Blood pressure under good control.  Continue same medication regimen.  Follow pressures.  Follow metabolic panel.        Relevant Orders   Basic metabolic panel   Hyperglycemia    Low carb diet and exercise.  Follow met b and a1c.       Relevant Orders   Hemoglobin A1c   Hypothyroidism    On thyroid replacement.  Follow tsh.        Primary osteoarthritis of both knees    Bilateral knee pain.  Seeing Dr Marry Guan.  Knees limit his activity.        Pure hypercholesterolemia    On crestor.  Low cholesterol diet and exercise.  Follow lipid panel and liver  function tests.       Relevant Orders   Lipid panel   Hepatic function panel       Einar Pheasant, MD

## 2015-12-29 NOTE — Progress Notes (Signed)
Pre-visit discussion using our clinic review tool. No additional management support is needed unless otherwise documented below in the visit note.  

## 2016-01-01 ENCOUNTER — Encounter: Payer: Self-pay | Admitting: Internal Medicine

## 2016-01-01 NOTE — Assessment & Plan Note (Signed)
On thyroid replacement.  Follow tsh.  

## 2016-01-01 NOTE — Assessment & Plan Note (Signed)
High grade stenosis.  S/p stent x 2.  Continues on apsirin and plavix.  Continue f/u with vascular surgery.

## 2016-01-01 NOTE — Assessment & Plan Note (Signed)
Bilateral knee pain.  Seeing Dr Marry Guan.  Knees limit his activity.

## 2016-01-01 NOTE — Assessment & Plan Note (Signed)
On crestor.  Low cholesterol diet and exercise.  Follow lipid panel and liver function tests.   

## 2016-01-01 NOTE — Assessment & Plan Note (Signed)
Blood pressure under good control.  Continue same medication regimen.  Follow pressures.  Follow metabolic panel.   

## 2016-01-01 NOTE — Assessment & Plan Note (Signed)
On plavix.  Seeing cardiology.  Stable.

## 2016-01-01 NOTE — Assessment & Plan Note (Signed)
Low carb diet and exercise.  Follow met b and a1c.  

## 2016-01-03 ENCOUNTER — Other Ambulatory Visit: Payer: Self-pay | Admitting: Internal Medicine

## 2016-01-10 DIAGNOSIS — M1711 Unilateral primary osteoarthritis, right knee: Secondary | ICD-10-CM | POA: Diagnosis not present

## 2016-01-25 ENCOUNTER — Encounter
Admission: RE | Admit: 2016-01-25 | Discharge: 2016-01-25 | Disposition: A | Discharge status: Home / Self Care | Payer: Medicare Other | Source: Ambulatory Visit | Attending: Orthopedic Surgery | Admitting: Orthopedic Surgery

## 2016-01-25 DIAGNOSIS — Z01812 Encounter for preprocedural laboratory examination: Secondary | ICD-10-CM | POA: Insufficient documentation

## 2016-01-25 DIAGNOSIS — I10 Essential (primary) hypertension: Secondary | ICD-10-CM | POA: Insufficient documentation

## 2016-01-25 DIAGNOSIS — I2510 Atherosclerotic heart disease of native coronary artery without angina pectoris: Secondary | ICD-10-CM | POA: Diagnosis not present

## 2016-01-25 HISTORY — DX: Depression, unspecified: F32.A

## 2016-01-25 HISTORY — DX: Cerebral infarction, unspecified: I63.9

## 2016-01-25 HISTORY — DX: Presence of cardiac pacemaker: Z95.0

## 2016-01-25 HISTORY — DX: Major depressive disorder, single episode, unspecified: F32.9

## 2016-01-25 HISTORY — DX: Cardiomyopathy, unspecified: I42.9

## 2016-01-25 HISTORY — DX: Heart failure, unspecified: I50.9

## 2016-01-25 LAB — ABO/RH: ABO/RH(D): O POS

## 2016-01-25 LAB — URINALYSIS COMPLETE WITH MICROSCOPIC (ARMC ONLY)
Bacteria, UA: NONE SEEN
Bilirubin Urine: NEGATIVE
Glucose, UA: NEGATIVE mg/dL
Ketones, ur: NEGATIVE mg/dL
Leukocytes, UA: NEGATIVE
Nitrite: NEGATIVE
PH: 5 (ref 5.0–8.0)
PROTEIN: NEGATIVE mg/dL
SPECIFIC GRAVITY, URINE: 1.008 (ref 1.005–1.030)
Squamous Epithelial / LPF: NONE SEEN

## 2016-01-25 LAB — SEDIMENTATION RATE: Sed Rate: 14 mm/hr (ref 0–20)

## 2016-01-25 LAB — COMPREHENSIVE METABOLIC PANEL
ALK PHOS: 91 U/L (ref 38–126)
ALT: 11 U/L — ABNORMAL LOW (ref 17–63)
ANION GAP: 9 (ref 5–15)
AST: 18 U/L (ref 15–41)
Albumin: 4.6 g/dL (ref 3.5–5.0)
BILIRUBIN TOTAL: 0.8 mg/dL (ref 0.3–1.2)
BUN: 13 mg/dL (ref 6–20)
CALCIUM: 9.5 mg/dL (ref 8.9–10.3)
CO2: 26 mmol/L (ref 22–32)
Chloride: 99 mmol/L — ABNORMAL LOW (ref 101–111)
Creatinine, Ser: 1.01 mg/dL (ref 0.61–1.24)
GFR calc non Af Amer: >60 mL/min (ref >60)
GLUCOSE: 102 mg/dL — AB (ref 65–99)
Potassium: 5.2 mmol/L — ABNORMAL HIGH (ref 3.5–5.1)
Sodium: 134 mmol/L — ABNORMAL LOW (ref 135–145)
TOTAL PROTEIN: 7.6 g/dL (ref 6.5–8.1)

## 2016-01-25 LAB — CBC
HEMATOCRIT: 38.4 % — AB (ref 40.0–52.0)
HEMOGLOBIN: 13.1 g/dL (ref 13.0–18.0)
MCH: 29.2 pg (ref 26.0–34.0)
MCHC: 34 g/dL (ref 32.0–36.0)
MCV: 85.9 fL (ref 80.0–100.0)
Platelets: 237 10*3/uL (ref 150–440)
RBC: 4.47 MIL/uL (ref 4.40–5.90)
RDW: 14.1 % (ref 11.5–14.5)
WBC: 5.7 10*3/uL (ref 3.8–10.6)

## 2016-01-25 LAB — MRSA PCR SCREENING: MRSA BY PCR: NEGATIVE

## 2016-01-25 LAB — APTT: aPTT: 36 seconds (ref 24–36)

## 2016-01-25 LAB — PROTIME-INR
INR: 1.14
Prothrombin Time: 14.8 seconds (ref 11.4–15.0)

## 2016-01-25 LAB — TYPE AND SCREEN
ABO/RH(D): O POS
Antibody Screen: NEGATIVE

## 2016-01-25 NOTE — Patient Instructions (Signed)
  Your procedure is scheduled on: 02/08/16 Report to Day Surgery. [facility] SECOND FLOOR To find out your arrival time please call [phone] between 1PM - 3PM on  02/07/16 Remember: Instructions that are not followed completely may result in serious medical risk, up to and including death, or upon the discretion of your surgeon and anesthesiologist your surgery may need to be rescheduled.    __X__ 1. Do not eat food or drink liquids after midnight. No gum chewing or hard candies.     _X___ 2. No Alcohol for 24 hours before or after surgery.   ____ 3. Bring all medications with you on the day of surgery if instructed.    _X___ 4. Notify your doctor if there is any change in your medical condition     (cold, fever, infections).     Do not wear jewelry, make-up, hairpins, clips or nail polish.  Do not wear lotions, powders, or perfumes. You may wear deodorant.  Do not shave 48 hours prior to surgery. Men may shave face and neck.  Do not bring valuables to the hospital.    [facility] is not responsible for any belongings or valuables.               Contacts, dentures or bridgework may not be worn into surgery.  Leave your suitcase in the car. After surgery it may be brought to your room.  For patients admitted to the hospital, discharge time is determined by your                treatment team.   Patients discharged the day of surgery will not be allowed to drive home.   Please read over the following fact sheets that you were given:   Surgical Site Infection Prevention/MRSA   _X___ Take these medicines the morning of surgery with A SIP OF WATER:    1. CELEXA  2. LEVOTHYROXINE  3. LISINOPRIL  4. METOPROLOL  5. PANTOPRAZOLE  6.  ____ Fleet Enema (as directed)   __X__ Use CHG Soap as directed  ____ Use inhalers on the day of surgery  ____ Stop metformin 2 days prior to surgery    ____ Take 1/2 of usual insulin dose the night before surgery and none on the morning of  surgery.   __X__ Stop Coumadin/Plavix/aspirin on  STOP PLAVIX  AND ASPIRIN  AS INSTRUCTED BY CARDIOLOGIST __X__ Stop Anti-inflammatories on   STOP NAPROXEN 7 DAYS BEFORE SURGERY   ____ Stop supplements until after surgery.    ____ Bring C-Pap to the hospital.   PRACTICE INCENTIVE SPIROMETRY AND BRING DAY OF SURGERY

## 2016-01-25 NOTE — Pre-Procedure Instructions (Addendum)
CLEARED BY DR Josefa Half 11/24/15.  LEFT MESSAGE TO VERIFY IF PATIENT CAN STOP ASPIRIN AND PLAVIX 7 DAYS PREOP PATIENT AWARE AND BRINGING BACK MEDICATIONS FOR VERIFICATION 01/26/16

## 2016-01-27 LAB — URINE CULTURE
CULTURE: NO GROWTH
Special Requests: NORMAL

## 2016-01-27 NOTE — Pre-Procedure Instructions (Signed)
Met B and CBC sent to Dr. Marry Guan and Anesthesia for review.  Also, UA results sent to Dr. Marry Guan for review.

## 2016-02-08 ENCOUNTER — Inpatient Hospital Stay: Payer: Medicare Other

## 2016-02-08 ENCOUNTER — Inpatient Hospital Stay: Payer: Medicare Other | Admitting: Anesthesiology

## 2016-02-08 ENCOUNTER — Telehealth: Payer: Self-pay | Admitting: Internal Medicine

## 2016-02-08 ENCOUNTER — Encounter: Payer: Self-pay | Admitting: Orthopedic Surgery

## 2016-02-08 ENCOUNTER — Inpatient Hospital Stay
Admission: RE | Admit: 2016-02-08 | Discharge: 2016-02-14 | DRG: 470 | Disposition: A | Discharge status: Skilled Nursing Facility | Payer: Medicare Other | Source: Ambulatory Visit | Attending: Orthopedic Surgery | Admitting: Orthopedic Surgery

## 2016-02-08 ENCOUNTER — Encounter
Admission: RE | Disposition: A | Discharge status: Skilled Nursing Facility | Payer: Self-pay | Source: Ambulatory Visit | Attending: Orthopedic Surgery

## 2016-02-08 DIAGNOSIS — R262 Difficulty in walking, not elsewhere classified: Secondary | ICD-10-CM | POA: Diagnosis not present

## 2016-02-08 DIAGNOSIS — I5022 Chronic systolic (congestive) heart failure: Secondary | ICD-10-CM | POA: Diagnosis present

## 2016-02-08 DIAGNOSIS — I110 Hypertensive heart disease with heart failure: Secondary | ICD-10-CM | POA: Diagnosis present

## 2016-02-08 DIAGNOSIS — R130 Aphagia: Secondary | ICD-10-CM | POA: Diagnosis not present

## 2016-02-08 DIAGNOSIS — M1711 Unilateral primary osteoarthritis, right knee: Secondary | ICD-10-CM | POA: Diagnosis present

## 2016-02-08 DIAGNOSIS — I2510 Atherosclerotic heart disease of native coronary artery without angina pectoris: Secondary | ICD-10-CM | POA: Diagnosis present

## 2016-02-08 DIAGNOSIS — E785 Hyperlipidemia, unspecified: Secondary | ICD-10-CM | POA: Diagnosis present

## 2016-02-08 DIAGNOSIS — M179 Osteoarthritis of knee, unspecified: Secondary | ICD-10-CM | POA: Diagnosis not present

## 2016-02-08 DIAGNOSIS — Z8673 Personal history of transient ischemic attack (TIA), and cerebral infarction without residual deficits: Secondary | ICD-10-CM

## 2016-02-08 DIAGNOSIS — I9581 Postprocedural hypotension: Secondary | ICD-10-CM | POA: Diagnosis not present

## 2016-02-08 DIAGNOSIS — M6281 Muscle weakness (generalized): Secondary | ICD-10-CM | POA: Diagnosis not present

## 2016-02-08 DIAGNOSIS — R6889 Other general symptoms and signs: Secondary | ICD-10-CM | POA: Diagnosis not present

## 2016-02-08 DIAGNOSIS — I9589 Other hypotension: Secondary | ICD-10-CM | POA: Diagnosis not present

## 2016-02-08 DIAGNOSIS — Z471 Aftercare following joint replacement surgery: Secondary | ICD-10-CM | POA: Diagnosis not present

## 2016-02-08 DIAGNOSIS — Z9861 Coronary angioplasty status: Secondary | ICD-10-CM

## 2016-02-08 DIAGNOSIS — Z7902 Long term (current) use of antithrombotics/antiplatelets: Secondary | ICD-10-CM | POA: Diagnosis not present

## 2016-02-08 DIAGNOSIS — Z96659 Presence of unspecified artificial knee joint: Secondary | ICD-10-CM

## 2016-02-08 DIAGNOSIS — Z79899 Other long term (current) drug therapy: Secondary | ICD-10-CM

## 2016-02-08 DIAGNOSIS — M1990 Unspecified osteoarthritis, unspecified site: Secondary | ICD-10-CM | POA: Diagnosis not present

## 2016-02-08 DIAGNOSIS — Z7401 Bed confinement status: Secondary | ICD-10-CM | POA: Diagnosis not present

## 2016-02-08 DIAGNOSIS — I509 Heart failure, unspecified: Secondary | ICD-10-CM | POA: Diagnosis not present

## 2016-02-08 DIAGNOSIS — Z96651 Presence of right artificial knee joint: Secondary | ICD-10-CM | POA: Diagnosis not present

## 2016-02-08 DIAGNOSIS — R1310 Dysphagia, unspecified: Secondary | ICD-10-CM | POA: Diagnosis not present

## 2016-02-08 DIAGNOSIS — Z7982 Long term (current) use of aspirin: Secondary | ICD-10-CM | POA: Diagnosis not present

## 2016-02-08 DIAGNOSIS — E039 Hypothyroidism, unspecified: Secondary | ICD-10-CM | POA: Diagnosis present

## 2016-02-08 DIAGNOSIS — E7800 Pure hypercholesterolemia, unspecified: Secondary | ICD-10-CM | POA: Diagnosis present

## 2016-02-08 DIAGNOSIS — Z741 Need for assistance with personal care: Secondary | ICD-10-CM | POA: Diagnosis not present

## 2016-02-08 DIAGNOSIS — D649 Anemia, unspecified: Secondary | ICD-10-CM

## 2016-02-08 DIAGNOSIS — R55 Syncope and collapse: Secondary | ICD-10-CM | POA: Diagnosis not present

## 2016-02-08 DIAGNOSIS — I429 Cardiomyopathy, unspecified: Secondary | ICD-10-CM | POA: Diagnosis present

## 2016-02-08 DIAGNOSIS — K5900 Constipation, unspecified: Secondary | ICD-10-CM | POA: Diagnosis present

## 2016-02-08 DIAGNOSIS — Z950 Presence of cardiac pacemaker: Secondary | ICD-10-CM

## 2016-02-08 DIAGNOSIS — I951 Orthostatic hypotension: Secondary | ICD-10-CM | POA: Diagnosis present

## 2016-02-08 DIAGNOSIS — D62 Acute posthemorrhagic anemia: Secondary | ICD-10-CM | POA: Diagnosis not present

## 2016-02-08 DIAGNOSIS — Z823 Family history of stroke: Secondary | ICD-10-CM

## 2016-02-08 DIAGNOSIS — Z8249 Family history of ischemic heart disease and other diseases of the circulatory system: Secondary | ICD-10-CM | POA: Diagnosis not present

## 2016-02-08 DIAGNOSIS — K219 Gastro-esophageal reflux disease without esophagitis: Secondary | ICD-10-CM | POA: Diagnosis present

## 2016-02-08 DIAGNOSIS — I739 Peripheral vascular disease, unspecified: Secondary | ICD-10-CM | POA: Diagnosis not present

## 2016-02-08 HISTORY — PX: KNEE ARTHROPLASTY: SHX992

## 2016-02-08 LAB — GLUCOSE, CAPILLARY: Glucose-Capillary: 114 mg/dL — ABNORMAL HIGH (ref 65–99)

## 2016-02-08 LAB — POCT I-STAT 4, (NA,K, GLUC, HGB,HCT)
GLUCOSE: 94 mg/dL (ref 65–99)
HEMATOCRIT: 40 % (ref 39.0–52.0)
Hemoglobin: 13.6 g/dL (ref 13.0–17.0)
POTASSIUM: 4 mmol/L (ref 3.5–5.1)
Sodium: 138 mmol/L (ref 135–145)

## 2016-02-08 SURGERY — ARTHROPLASTY, KNEE, TOTAL, USING IMAGELESS COMPUTER-ASSISTED NAVIGATION
Anesthesia: Spinal | Laterality: Right

## 2016-02-08 MED ORDER — NITROGLYCERIN 0.4 MG SL SUBL
0.4000 mg | SUBLINGUAL_TABLET | SUBLINGUAL | Status: DC | PRN
Start: 2016-02-08 — End: 2016-02-09

## 2016-02-08 MED ORDER — ACETAMINOPHEN 10 MG/ML IV SOLN
INTRAVENOUS | Status: AC
  Filled 2016-02-08: qty 100

## 2016-02-08 MED ORDER — PROPOFOL 500 MG/50ML IV EMUL
INTRAVENOUS | Status: DC | PRN
Start: 2016-02-08 — End: 2016-02-08
  Administered 2016-02-08: 50 ug/kg/min via INTRAVENOUS

## 2016-02-08 MED ORDER — TETRACAINE HCL 1 % IJ SOLN
INTRAMUSCULAR | Status: DC | PRN
  Administered 2016-02-08: 10 mg via INTRASPINAL

## 2016-02-08 MED ORDER — PHENOL 1.4 % MT LIQD
1.0000 | OROMUCOSAL | Status: DC | PRN
Start: 2016-02-08 — End: 2016-02-14

## 2016-02-08 MED ORDER — SODIUM CHLORIDE 0.9 % IV SOLN
INTRAVENOUS | Status: DC | PRN
  Administered 2016-02-08: 60 mL

## 2016-02-08 MED ORDER — PHENYLEPHRINE HCL 10 MG/ML IJ SOLN
INTRAMUSCULAR | Status: DC | PRN
  Administered 2016-02-08: 100 ug via INTRAVENOUS
  Administered 2016-02-08 (×2): 200 ug via INTRAVENOUS

## 2016-02-08 MED ORDER — CEFAZOLIN SODIUM-DEXTROSE 2-4 GM/100ML-% IV SOLN: 2.0000 g | INTRAVENOUS | Status: DC

## 2016-02-08 MED ORDER — KETAMINE HCL 10 MG/ML IJ SOLN
INTRAMUSCULAR | Status: DC | PRN
  Administered 2016-02-08: 50 mg via INTRAVENOUS

## 2016-02-08 MED ORDER — TETRACAINE HCL 1 % IJ SOLN
INTRAMUSCULAR | Status: AC
  Filled 2016-02-08: qty 2

## 2016-02-08 MED ORDER — FENTANYL CITRATE (PF) 100 MCG/2ML IJ SOLN: 25.0000 ug | INTRAMUSCULAR | Status: DC | PRN

## 2016-02-08 MED ORDER — METOCLOPRAMIDE HCL 10 MG PO TABS: 10.0000 mg | ORAL_TABLET | Freq: Three times a day (TID) | ORAL | Status: DC

## 2016-02-08 MED ORDER — OXYCODONE HCL 5 MG/5ML PO SOLN: 5.0000 mg | Freq: Once | ORAL | Status: DC | PRN

## 2016-02-08 MED ORDER — GLYCOPYRROLATE 0.2 MG/ML IJ SOLN
INTRAMUSCULAR | Status: DC | PRN
  Administered 2016-02-08: 0.2 mg via INTRAVENOUS

## 2016-02-08 MED ORDER — BISACODYL 10 MG RE SUPP
10.0000 mg | Freq: Every day | RECTAL | Status: DC | PRN
  Administered 2016-02-12: 10 mg via RECTAL
  Filled 2016-02-08: qty 1

## 2016-02-08 MED ORDER — ONDANSETRON HCL 4 MG PO TABS: 4.0000 mg | ORAL_TABLET | ORAL | Status: DC | PRN

## 2016-02-08 MED ORDER — LEVOTHYROXINE SODIUM 112 MCG PO TABS
112.0000 ug | ORAL_TABLET | Freq: Every day | ORAL | Status: DC
  Administered 2016-02-09 – 2016-02-14 (×6): 112 ug via ORAL
  Filled 2016-02-08 (×6): qty 1

## 2016-02-08 MED ORDER — ROSUVASTATIN CALCIUM 20 MG PO TABS
20.0000 mg | ORAL_TABLET | Freq: Every day | ORAL | Status: DC
  Administered 2016-02-09 – 2016-02-14 (×6): 20 mg via ORAL
  Filled 2016-02-08 (×6): qty 1

## 2016-02-08 MED ORDER — FENTANYL CITRATE (PF) 100 MCG/2ML IJ SOLN
INTRAMUSCULAR | Status: DC | PRN
  Administered 2016-02-08: 50 ug via INTRAVENOUS

## 2016-02-08 MED ORDER — LIDOCAINE HCL (PF) 2 % IJ SOLN
INTRAMUSCULAR | Status: DC | PRN
  Administered 2016-02-08: 50 mg

## 2016-02-08 MED ORDER — CEFAZOLIN SODIUM-DEXTROSE 2-4 GM/100ML-% IV SOLN
INTRAVENOUS | Status: AC
  Administered 2016-02-08: 2 g via INTRAVENOUS
  Filled 2016-02-08: qty 100

## 2016-02-08 MED ORDER — ACETAMINOPHEN 10 MG/ML IV SOLN
INTRAVENOUS | Status: DC | PRN
  Administered 2016-02-08: 1000 mg via INTRAVENOUS

## 2016-02-08 MED ORDER — CHLORHEXIDINE GLUCONATE 4 % EX LIQD: 60.0000 mL | Freq: Once | CUTANEOUS | Status: DC

## 2016-02-08 MED ORDER — ACETAMINOPHEN 10 MG/ML IV SOLN
1000.0000 mg | Freq: Four times a day (QID) | INTRAVENOUS | Status: AC
  Administered 2016-02-08 – 2016-02-09 (×3): 1000 mg via INTRAVENOUS
  Filled 2016-02-08 (×4): qty 100

## 2016-02-08 MED ORDER — FERROUS SULFATE 325 (65 FE) MG PO TABS
325.0000 mg | ORAL_TABLET | Freq: Two times a day (BID) | ORAL | Status: DC
  Administered 2016-02-09 – 2016-02-14 (×11): 325 mg via ORAL
  Filled 2016-02-08 (×11): qty 1

## 2016-02-08 MED ORDER — OXYCODONE HCL 5 MG PO TABS: 5.0000 mg | ORAL_TABLET | Freq: Once | ORAL | Status: DC | PRN

## 2016-02-08 MED ORDER — ALUM & MAG HYDROXIDE-SIMETH 200-200-20 MG/5ML PO SUSP: 30.0000 mL | ORAL | Status: DC | PRN

## 2016-02-08 MED ORDER — PANTOPRAZOLE SODIUM 40 MG PO TBEC
40.0000 mg | DELAYED_RELEASE_TABLET | Freq: Two times a day (BID) | ORAL | Status: DC
Start: 2016-02-08 — End: 2016-02-08

## 2016-02-08 MED ORDER — PANTOPRAZOLE SODIUM 40 MG PO TBEC
40.0000 mg | DELAYED_RELEASE_TABLET | Freq: Two times a day (BID) | ORAL | Status: DC
  Administered 2016-02-09 – 2016-02-14 (×11): 40 mg via ORAL
  Filled 2016-02-08 (×11): qty 1

## 2016-02-08 MED ORDER — METOCLOPRAMIDE HCL 10 MG PO TABS
10.0000 mg | ORAL_TABLET | Freq: Three times a day (TID) | ORAL | Status: DC
  Administered 2016-02-09 – 2016-02-14 (×19): 10 mg via ORAL
  Filled 2016-02-08 (×19): qty 1

## 2016-02-08 MED ORDER — SODIUM CHLORIDE 0.9 % IV SOLN
INTRAVENOUS | Status: DC
  Administered 2016-02-08 – 2016-02-09 (×5): via INTRAVENOUS

## 2016-02-08 MED ORDER — METOPROLOL TARTRATE 25 MG PO TABS: 25.0000 mg | ORAL_TABLET | Freq: Two times a day (BID) | ORAL | Status: DC

## 2016-02-08 MED ORDER — BUPIVACAINE-EPINEPHRINE 0.25% -1:200000 IJ SOLN
INTRAMUSCULAR | Status: DC | PRN
  Administered 2016-02-08: 30 mL

## 2016-02-08 MED ORDER — ENOXAPARIN SODIUM 30 MG/0.3ML ~~LOC~~ SOLN
30.0000 mg | Freq: Two times a day (BID) | SUBCUTANEOUS | Status: DC
  Administered 2016-02-09 – 2016-02-10 (×4): 30 mg via SUBCUTANEOUS
  Filled 2016-02-08 (×5): qty 0.3

## 2016-02-08 MED ORDER — SODIUM CHLORIDE 0.9 % IV SOLN
Freq: Once | INTRAVENOUS | Status: AC
  Administered 2016-02-08: 19:00:00 via INTRAVENOUS

## 2016-02-08 MED ORDER — MIDAZOLAM HCL 5 MG/5ML IJ SOLN
INTRAMUSCULAR | Status: DC | PRN
  Administered 2016-02-08: 1 mg via INTRAVENOUS

## 2016-02-08 MED ORDER — NEOMYCIN-POLYMYXIN B GU 40-200000 IR SOLN
Status: AC
  Filled 2016-02-08: qty 20

## 2016-02-08 MED ORDER — PANTOPRAZOLE SODIUM 40 MG IV SOLR
40.0000 mg | Freq: Two times a day (BID) | INTRAVENOUS | Status: DC
  Administered 2016-02-08: 40 mg via INTRAVENOUS
  Filled 2016-02-08 (×2): qty 40

## 2016-02-08 MED ORDER — ONDANSETRON HCL 4 MG PO TABS: 4.0000 mg | ORAL_TABLET | Freq: Four times a day (QID) | ORAL | Status: DC | PRN

## 2016-02-08 MED ORDER — LISINOPRIL 20 MG PO TABS: 40.0000 mg | ORAL_TABLET | Freq: Every day | ORAL | Status: DC

## 2016-02-08 MED ORDER — METOCLOPRAMIDE HCL 5 MG/ML IJ SOLN
10.0000 mg | Freq: Three times a day (TID) | INTRAMUSCULAR | Status: DC
  Administered 2016-02-08: 10 mg via INTRAVENOUS
  Filled 2016-02-08: qty 2

## 2016-02-08 MED ORDER — CLOPIDOGREL BISULFATE 75 MG PO TABS
75.0000 mg | ORAL_TABLET | Freq: Every day | ORAL | Status: DC
  Filled 2016-02-08: qty 1

## 2016-02-08 MED ORDER — LORAZEPAM 1 MG PO TABS
1.0000 mg | ORAL_TABLET | Freq: Every evening | ORAL | Status: DC | PRN
  Administered 2016-02-11 – 2016-02-13 (×3): 1 mg via ORAL
  Filled 2016-02-08 (×3): qty 1

## 2016-02-08 MED ORDER — ACETAMINOPHEN 650 MG RE SUPP: 650.0000 mg | Freq: Four times a day (QID) | RECTAL | Status: DC | PRN

## 2016-02-08 MED ORDER — CEFAZOLIN SODIUM-DEXTROSE 2-4 GM/100ML-% IV SOLN
2.0000 g | Freq: Four times a day (QID) | INTRAVENOUS | Status: AC
  Administered 2016-02-08 – 2016-02-09 (×4): 2 g via INTRAVENOUS
  Filled 2016-02-08 (×4): qty 100

## 2016-02-08 MED ORDER — DIPHENHYDRAMINE HCL 12.5 MG/5ML PO ELIX
12.5000 mg | ORAL_SOLUTION | ORAL | Status: DC | PRN
  Filled 2016-02-08: qty 10

## 2016-02-08 MED ORDER — ONDANSETRON HCL 4 MG/2ML IJ SOLN
4.0000 mg | Freq: Four times a day (QID) | INTRAMUSCULAR | Status: DC | PRN
  Administered 2016-02-08: 4 mg via INTRAVENOUS
  Filled 2016-02-08: qty 2

## 2016-02-08 MED ORDER — SENNOSIDES-DOCUSATE SODIUM 8.6-50 MG PO TABS
1.0000 | ORAL_TABLET | Freq: Two times a day (BID) | ORAL | Status: DC
  Administered 2016-02-09 – 2016-02-11 (×6): 1 via ORAL
  Filled 2016-02-08 (×6): qty 1

## 2016-02-08 MED ORDER — EPHEDRINE SULFATE 50 MG/ML IJ SOLN
INTRAMUSCULAR | Status: DC | PRN
  Administered 2016-02-08 (×2): 10 mg via INTRAVENOUS

## 2016-02-08 MED ORDER — SODIUM CHLORIDE 0.9 % IV BOLUS (SEPSIS)
500.0000 mL | Freq: Once | INTRAVENOUS | Status: AC
  Administered 2016-02-08: 500 mL via INTRAVENOUS

## 2016-02-08 MED ORDER — BUPIVACAINE LIPOSOME 1.3 % IJ SUSP
INTRAMUSCULAR | Status: AC
  Filled 2016-02-08: qty 20

## 2016-02-08 MED ORDER — SODIUM CHLORIDE 0.9 % IJ SOLN
INTRAMUSCULAR | Status: AC
  Filled 2016-02-08: qty 50

## 2016-02-08 MED ORDER — ACETAMINOPHEN 325 MG PO TABS
650.0000 mg | ORAL_TABLET | Freq: Four times a day (QID) | ORAL | Status: DC | PRN
  Administered 2016-02-09 – 2016-02-11 (×5): 650 mg via ORAL
  Filled 2016-02-08 (×5): qty 2

## 2016-02-08 MED ORDER — TRAMADOL HCL 50 MG PO TABS
50.0000 mg | ORAL_TABLET | ORAL | Status: DC | PRN
  Administered 2016-02-10 – 2016-02-12 (×7): 50 mg via ORAL
  Administered 2016-02-13: 100 mg via ORAL
  Administered 2016-02-13: 50 mg via ORAL
  Administered 2016-02-14 (×2): 100 mg via ORAL
  Filled 2016-02-08: qty 2
  Filled 2016-02-08: qty 1
  Filled 2016-02-08: qty 2
  Filled 2016-02-08 (×3): qty 1
  Filled 2016-02-08: qty 2
  Filled 2016-02-08 (×5): qty 1

## 2016-02-08 MED ORDER — MENTHOL 3 MG MT LOZG: 1.0000 | LOZENGE | OROMUCOSAL | Status: DC | PRN

## 2016-02-08 MED ORDER — LORATADINE 10 MG PO TABS
10.0000 mg | ORAL_TABLET | Freq: Every day | ORAL | Status: DC
  Administered 2016-02-09 – 2016-02-14 (×6): 10 mg via ORAL
  Filled 2016-02-08 (×6): qty 1

## 2016-02-08 MED ORDER — ONDANSETRON HCL 4 MG/2ML IJ SOLN
4.0000 mg | INTRAMUSCULAR | Status: DC | PRN
Start: 2016-02-08 — End: 2016-02-14

## 2016-02-08 MED ORDER — CITALOPRAM HYDROBROMIDE 20 MG PO TABS
20.0000 mg | ORAL_TABLET | Freq: Every day | ORAL | Status: DC
  Administered 2016-02-09 – 2016-02-14 (×6): 20 mg via ORAL
  Filled 2016-02-08 (×6): qty 1

## 2016-02-08 MED ORDER — BUPIVACAINE HCL (PF) 0.5 % IJ SOLN
INTRAMUSCULAR | Status: DC | PRN
  Administered 2016-02-08: 2 mL via INTRATHECAL

## 2016-02-08 MED ORDER — NEOMYCIN-POLYMYXIN B GU 40-200000 IR SOLN
Status: DC | PRN
  Administered 2016-02-08: 14 mL

## 2016-02-08 MED ORDER — MORPHINE SULFATE (PF) 2 MG/ML IV SOLN: 2.0000 mg | INTRAVENOUS | Status: DC | PRN

## 2016-02-08 MED ORDER — LACTATED RINGERS IV SOLN
INTRAVENOUS | Status: DC
  Administered 2016-02-08 (×2): via INTRAVENOUS

## 2016-02-08 MED ORDER — SODIUM CHLORIDE 0.9 % IV SOLN
10000.0000 ug | INTRAVENOUS | Status: DC | PRN
  Administered 2016-02-08: 25 ug/min via INTRAVENOUS

## 2016-02-08 MED ORDER — OXYCODONE HCL 5 MG PO TABS
5.0000 mg | ORAL_TABLET | ORAL | Status: DC | PRN
  Filled 2016-02-08: qty 1

## 2016-02-08 MED ORDER — MAGNESIUM HYDROXIDE 400 MG/5ML PO SUSP
30.0000 mL | Freq: Every day | ORAL | Status: DC | PRN
  Administered 2016-02-10: 30 mL via ORAL
  Filled 2016-02-08: qty 30

## 2016-02-08 MED ORDER — FLEET ENEMA 7-19 GM/118ML RE ENEM: 1.0000 | ENEMA | Freq: Once | RECTAL | Status: DC | PRN

## 2016-02-08 MED ORDER — BUPIVACAINE-EPINEPHRINE (PF) 0.25% -1:200000 IJ SOLN
INTRAMUSCULAR | Status: AC
  Filled 2016-02-08: qty 30

## 2016-02-08 SURGICAL SUPPLY — 58 items
AUTOTRANSFUS HAS 1/8 (MISCELLANEOUS) ×2
BATTERY INSTRU NAVIGATION (MISCELLANEOUS) ×8 IMPLANT
BLADE SAW 1 (BLADE) ×2 IMPLANT
BLADE SAW 1/2 (BLADE) ×2 IMPLANT
CANISTER SUCT 1200ML W/VALVE (MISCELLANEOUS) ×2 IMPLANT
CANISTER SUCT 3000ML (MISCELLANEOUS) ×4 IMPLANT
CAPT KNEE TOTAL 3 ATTUNE ×2 IMPLANT
CATH TRAY METER 16FR LF (MISCELLANEOUS) ×2 IMPLANT
CEMENT HV SMART SET (Cement) ×4 IMPLANT
COOLER POLAR GLACIER W/PUMP (MISCELLANEOUS) ×2 IMPLANT
CUFF TOURN 24 STER (MISCELLANEOUS) IMPLANT
CUFF TOURN 30 STER DUAL PORT (MISCELLANEOUS) IMPLANT
DRAPE SHEET LG 3/4 BI-LAMINATE (DRAPES) ×2 IMPLANT
DRSG DERMACEA 8X12 NADH (GAUZE/BANDAGES/DRESSINGS) ×2 IMPLANT
DRSG OPSITE POSTOP 4X14 (GAUZE/BANDAGES/DRESSINGS) ×2 IMPLANT
DRSG TEGADERM 4X4.75 (GAUZE/BANDAGES/DRESSINGS) ×2 IMPLANT
DURAPREP 26ML APPLICATOR (WOUND CARE) ×4 IMPLANT
ELECT CAUTERY BLADE 6.4 (BLADE) ×2 IMPLANT
ELECT REM PT RETURN 9FT ADLT (ELECTROSURGICAL) ×2
ELECTRODE REM PT RTRN 9FT ADLT (ELECTROSURGICAL) ×1 IMPLANT
EX-PIN ORTHOLOCK NAV 4X150 (PIN) ×4 IMPLANT
GLOVE BIOGEL M STRL SZ7.5 (GLOVE) ×4 IMPLANT
GLOVE INDICATOR 8.0 STRL GRN (GLOVE) ×2 IMPLANT
GLOVE SURG 9.0 ORTHO LTXF (GLOVE) ×2 IMPLANT
GLOVE SURG ORTHO 9.0 STRL STRW (GLOVE) ×2 IMPLANT
GOWN STRL REUS W/ TWL LRG LVL3 (GOWN DISPOSABLE) ×2 IMPLANT
GOWN STRL REUS W/TWL 2XL LVL3 (GOWN DISPOSABLE) ×2 IMPLANT
GOWN STRL REUS W/TWL LRG LVL3 (GOWN DISPOSABLE) ×2
HANDPIECE SUCTION TUBG SURGILV (MISCELLANEOUS) ×2 IMPLANT
HOLDER FOLEY CATH W/STRAP (MISCELLANEOUS) ×2 IMPLANT
HOOD PEEL AWAY FLYTE STAYCOOL (MISCELLANEOUS) ×4 IMPLANT
KIT RM TURNOVER STRD PROC AR (KITS) ×2 IMPLANT
KNIFE SCULPS 14X20 (INSTRUMENTS) ×2 IMPLANT
NDL SAFETY 18GX1.5 (NEEDLE) ×2 IMPLANT
NEEDLE SPNL 20GX3.5 QUINCKE YW (NEEDLE) ×2 IMPLANT
NS IRRIG 500ML POUR BTL (IV SOLUTION) ×2 IMPLANT
PACK TOTAL KNEE (MISCELLANEOUS) ×2 IMPLANT
PAD WRAPON POLAR KNEE (MISCELLANEOUS) ×1 IMPLANT
PIN DRILL QUICK PACK ×2 IMPLANT
PIN FIXATION 1/8DIA X 3INL (PIN) ×2 IMPLANT
SOL .9 NS 3000ML IRR  AL (IV SOLUTION) ×1
SOL .9 NS 3000ML IRR UROMATIC (IV SOLUTION) ×1 IMPLANT
SOL PREP PVP 2OZ (MISCELLANEOUS) ×2
SOLUTION PREP PVP 2OZ (MISCELLANEOUS) ×1 IMPLANT
SPONGE DRAIN TRACH 4X4 STRL 2S (GAUZE/BANDAGES/DRESSINGS) ×2 IMPLANT
STAPLER SKIN PROX 35W (STAPLE) ×2 IMPLANT
SUCTION FRAZIER HANDLE 10FR (MISCELLANEOUS) ×1
SUCTION TUBE FRAZIER 10FR DISP (MISCELLANEOUS) ×1 IMPLANT
SUT VIC AB 0 CT1 36 (SUTURE) ×2 IMPLANT
SUT VIC AB 1 CT1 36 (SUTURE) ×4 IMPLANT
SUT VIC AB 2-0 CT2 27 (SUTURE) ×2 IMPLANT
SYR 20CC LL (SYRINGE) ×2 IMPLANT
SYR 30ML LL (SYRINGE) ×2 IMPLANT
SYR 50ML LL SCALE MARK (SYRINGE) ×2 IMPLANT
SYSTEM AUTOTRANSFUS DUAL TROCR (MISCELLANEOUS) ×1 IMPLANT
TOWEL OR 17X26 4PK STRL BLUE (TOWEL DISPOSABLE) ×2 IMPLANT
TOWER CARTRIDGE SMART MIX (DISPOSABLE) ×2 IMPLANT
WRAPON POLAR PAD KNEE (MISCELLANEOUS) ×2

## 2016-02-08 NOTE — Anesthesia Procedure Notes (Signed)
Spinal Patient location during procedure: OR Staffing Anesthesiologist: PISCITELLO, JOSEPH K Resident/CRNA: Janari Gagner Performed by: resident/CRNA  Preanesthetic Checklist Completed: patient identified, site marked, surgical consent, pre-op evaluation, timeout performed, IV checked, risks and benefits discussed and monitors and equipment checked Spinal Block Patient position: sitting Prep: Betadine and site prepped and draped Patient monitoring: heart rate, continuous pulse ox, blood pressure and cardiac monitor Approach: midline Location: L4-5 Injection technique: single-shot Needle Needle type: Whitacre and Introducer  Needle gauge: 25 G Needle length: 12.7 cm Additional Notes Negative paresthesia. Negative blood return. Positive free-flowing CSF. Expiration date of kit checked and confirmed. Patient tolerated procedure well, without complications.     

## 2016-02-08 NOTE — Transfer of Care (Signed)
Immediate Anesthesia Transfer of Care Note  Patient: Daniel Austin  Procedure(s) Performed: Procedure(s): COMPUTER ASSISTED TOTAL KNEE ARTHROPLASTY (Right)  Patient Location: PACU  Anesthesia Type:Spinal  Level of Consciousness: awake and alert   Airway & Oxygen Therapy: Patient Spontanous Breathing and Patient connected to face mask oxygen  Post-op Assessment: Report given to RN  Post vital signs: Reviewed  Last Vitals:  Filed Vitals:   02/08/16 1020 02/08/16 1447  BP: 135/77 144/90  Pulse: 64 79  Temp: 36.8 C 36.8 C  Resp: 18 11    Last Pain: There were no vitals filed for this visit.       Complications: No apparent anesthesia complications

## 2016-02-08 NOTE — Telephone Encounter (Signed)
-----   Message from [place] sent at 02/08/2016  4:07 PM EDT ----- Regarding: Pt's surgery Daniel Austin"s daughter called. She wanted you to know that her father's surgery went well. He is in room 143. She wanted to thank you for everything.

## 2016-02-08 NOTE — Consult Note (Signed)
[facility] - [place] [facility]   PATIENT NAME: Daniel Austin    MR#:  [id number]  DATE OF BIRTH:  May 14, 1941  DATE OF ADMISSION:  02/08/2016  PRIMARY CARE PHYSICIAN: Dale Lula, MD   REQUESTING/REFERRING PHYSICIAN: dr Ernest Pine  CHIEF COMPLAINT:  Hypotension post-op  HISTORY OF PRESENT ILLNESS:  Daniel Austin  is a 75 y.o. male with a known history of DJD, coronary artery disease, anemia, hypertension, hyperlipidemia, was admitted on the order service for elective right total knee arthroplasty. Patient underwent surgery under spinal anesthesia. He received 1700 cc of IV fluids. He had about 200 cc of volume loss. Postop patient was brought to the operative room and his blood pressure dropped down to 88/54 --- 54/40 --- 60/36 --- 1 L IV bolus and blood products along with IV 10 mg of ephedrine brought his blood pressure up to 104 or 49  -Patient denies any symptoms of chest pain dizziness nausea or vomiting. Family in the room.  Internal medicine was consulted for postop hypotension.  According to patient's wife he had similar symptoms at [facility] after his surgery in the past.  PAST MEDICAL HISTORY:   Past Medical History  Diagnosis Date  . Hypercholesterolemia   . Hypothyroidism   . Hypertension   . Fractures, multiple     Metal rod in left foot  . CAD (coronary artery disease)   . Carotid artery stenosis     high-grade symptomatic right sided s/p stent twice  . Syncope     second degree heart block s/p St Jude's pacemaker placement 12/08  . Anemia   . Ulcer of gastric fundus   . Hyperglycemia   . Heart block   . Shortness of breath dyspnea   . GERD (gastroesophageal reflux disease)   . Arthritis   . Knee pain   . Presence of permanent cardiac pacemaker   . Depression   . CHF (congestive heart failure) (HCC)     chronic  . Cardiomyopathy (HCC)   . Stroke [facility])     clot in neck broke off left side weakness 2008  resolved    PAST  SURGICAL HISTOIRY:   Past Surgical History  Procedure Laterality Date  . Pacemaker insertion  08/2007  . Hernia repair      inguinal  . Percutaneous placement intravascular stent cervical carotid artery      twice, right carotid artery  . Heart stint  11/04/2014    [facility]  . Pacemaker insertion N/A 11/16/2015    Procedure: INSERTION PACEMAKER/ PACEMAKER CHANGE OUT;  Surgeon: Marcina Millard, MD;  Location: [facility] ORS;  Service: Cardiovascular;  Laterality: N/A;  . Insert / replace / remove pacemaker    . Cardiac catheterization    . Coronary angioplasty      stents x 3    SOCIAL HISTORY:   Social History  Substance Use Topics  . Smoking status: Never Smoker   . Smokeless tobacco: Never Used  . Alcohol Use: No    FAMILY HISTORY:   Family History  Problem Relation Age of Onset  . Heart disease Mother     CHF  . Heart disease Father     CHF  . Thyroid disease Father     hypothyroidism  . Diabetes Sister     diet controlled  . Thyroid disease Sister   . Thyroid disease Sister   . Colon polyps Father   . Colon cancer Neg Hx   . Prostate cancer Neg Hx  DRUG ALLERGIES:   Allergies  Allergen Reactions  . Beta Adrenergic Blockers Other (See Comments)    Second Degree Heart Block  . Lipitor [Atorvastatin] Other (See Comments)    Myalgia Other Reaction: myalgia Myalgia     REVIEW OF SYSTEMS:   Review of Systems  Constitutional: Negative for fever, chills and weight loss.  HENT: Negative for ear discharge, ear pain and nosebleeds.   Eyes: Negative for blurred vision, pain and discharge.  Respiratory: Negative for sputum production, shortness of breath, wheezing and stridor.   Cardiovascular: Negative for chest pain, palpitations, orthopnea and PND.  Gastrointestinal: Negative for nausea, vomiting, abdominal pain and diarrhea.  Genitourinary: Negative for urgency and frequency.  Musculoskeletal: Negative for back pain and joint pain.  Neurological:  Negative for sensory change, speech change, focal weakness and weakness.  Psychiatric/Behavioral: Negative for depression and hallucinations. The patient is not nervous/anxious.   All other systems reviewed and are negative.  MEDICATIONS AT HOME:   Prior to Admission medications   Medication Sig Start Date End Date Taking? Authorizing Provider  acetaminophen (TYLENOL) 500 MG tablet Take 500 mg by mouth every 6 (six) hours as needed.   Yes Historical Provider, MD  Aspirin 81 MG EC tablet Take 81 mg by mouth daily.   Yes Historical Provider, MD  citalopram (CELEXA) 20 MG tablet TAKE 1 TABLET (20 MG TOTAL) BY MOUTH DAILY. 12/29/15  Yes Einar Pheasant, MD  clopidogrel (PLAVIX) 75 MG tablet TAKE 1 TABLET BY MOUTH DAILY 12/12/15  Yes Einar Pheasant, MD  CRESTOR 20 MG tablet TAKE 1 TABLET (20 MG TOTAL) BY MOUTH NIGHTLY. 11/08/15  Yes Einar Pheasant, MD  fexofenadine (ALLEGRA) 180 MG tablet Take 180 mg by mouth daily. As needed   Yes Historical Provider, MD  levothyroxine (SYNTHROID, LEVOTHROID) 112 MCG tablet TAKE 1 TABLET (112 MCG TOTAL) BY MOUTH DAILY. 11/29/15  Yes Jackolyn Confer, MD  lisinopril (PRINIVIL,ZESTRIL) 40 MG tablet TAKE 1 TABLET (40 MG TOTAL) BY MOUTH DAILY. Patient taking differently: Take 40 mg by mouth at bedtime. TAKE 1 TABLET (40 MG TOTAL) BY MOUTH DAILY. 08/30/15  Yes Einar Pheasant, MD  LORazepam (ATIVAN) 1 MG tablet TAKE 1 TABLET BY MOUTH AT BEDTIME AS NEEDED FOR ANXIETY 12/29/15  Yes Einar Pheasant, MD  metoprolol tartrate (LOPRESSOR) 25 MG tablet TAKE 1 TABLET BY MOUTH TWICE A DAY 11/07/15  Yes Einar Pheasant, MD  naproxen sodium (ANAPROX) 220 MG tablet Take 220 mg by mouth as needed (knee pain).   Yes Historical Provider, MD  nitroGLYCERIN (NITROSTAT) 0.4 MG SL tablet Place 0.4 mg under the tongue every 5 (five) minutes as needed. If a third tablet is needed please call 911.   Yes Historical Provider, MD  pantoprazole (PROTONIX) 40 MG tablet TAKE 1 TABLET (40 MG TOTAL) BY MOUTH  DAILY. 01/03/16  Yes Einar Pheasant, MD      VITAL SIGNS:  Blood pressure 106/54, pulse 68, temperature 97.6 F (36.4 C), temperature source Oral, resp. rate 16, height 5\' 10"  (1.778 m), weight 90.719 kg (200 lb), SpO2 98 %.  PHYSICAL EXAMINATION:  GENERAL:  75 y.o.-year-old patient lying in the bed with no acute distress.  EYES: Pupils equal, round, reactive to light and accommodation. No scleral icterus. Extraocular muscles intact.  HEENT: Head atraumatic, normocephalic. Oropharynx and nasopharynx clear.  NECK:  Supple, no jugular venous distention. No thyroid enlargement, no tenderness.  LUNGS: Normal breath sounds bilaterally, no wheezing, rales,rhonchi or crepitation. No use of accessory muscles of respiration.  CARDIOVASCULAR:  S1, S2 normal. No murmurs, rubs, or gallops.  ABDOMEN: Soft, nontender, nondistended. Bowel sounds present. No organomegaly or mass.  EXTREMITIES: No pedal edema, cyanosis, or clubbing. Right knee surgical dressing + NEUROLOGIC: Cranial nerves II through XII are intact. Muscle strength 5/5 in all extremities. Sensation intact. Gait not checked.  PSYCHIATRIC: The patient is alert and oriented x 3.  SKIN: No obvious rash, lesion, or ulcer.   LABORATORY PANEL:   CBC  Recent Labs Lab 02/08/16 1017  HGB 13.6  HCT 40.0   ------------------------------------------------------------------------------------------------------------------  Chemistries   Recent Labs Lab 02/08/16 1017  NA 138  K 4.0  GLUCOSE 94   ------------------------------------------------------------------------------------------------------------------  Cardiac Enzymes No results for input(s): TROPONINI in the last 168 hours. ------------------------------------------------------------------------------------------------------------------  RADIOLOGY:  Dg Knee Right Port  02/08/2016  CLINICAL DATA:  Status post right knee replacement EXAM: PORTABLE RIGHT KNEE - 1-2 VIEW  COMPARISON:  None. FINDINGS: Right knee prosthesis is seen. No acute bony abnormality is noted. Surgical drains are noted in place. IMPRESSION: No acute abnormality noted. Electronically Signed   By: Inez Catalina M.D.   On: 02/08/2016 15:24    EKG:   Orders placed or performed during the hospital encounter of 11/09/15  . EKG 12-Lead  . EKG 12-Lead    IMPRESSION AND PLAN:  Daniel Austin  is a 75 y.o. male with a known history of DJD, coronary artery disease, anemia, hypertension, hyperlipidemia, was admitted on the order service for elective right total knee arthroplasty. Patient underwent surgery under spinal anesthesia.  1.Postop hypotension -Patient is status post right knee total arthroplasty elective surgery done under spinal anesthesia -Currently getting IV fluid bolus of 1000 cc along with Hemovac product, patient also received IV 10 mg of ephedrine by anesthesiologist. Blood pressure is 104/49 -Patient is asymptomatic we'll continue to monitor -If continues to drop again then we'll consider transferring to ICU for IV vasopressors -Hold off BP meds  2. History of CAD -Continue cardiac meds after blood pressure stable  3. Hypothyroidism on Synthroid  4. Hyperlipidemia on Crestor  5. DVT prophylaxis per ortho  All the records are reviewed and case discussed with Consulting provider. Management plans discussed with the patient, family and they are in agreement.  CODE STATUS:full  TOTAL TIME TAKING CARE OF THIS PATIENT: 45 minutes.    Daniel Austin M.D on 02/08/2016 at 5:54 PM  Between 7am to 6pm - Pager - [phone]  After 6pm go to [email or web address] - password [email or web address] [facility]  Office  [phone]  CC: Primary care Physician: Einar Pheasant, MD

## 2016-02-08 NOTE — Progress Notes (Signed)
Rn started IV in right forarm. Infusing 450 of autovac blood.

## 2016-02-08 NOTE — Progress Notes (Signed)
Dr. Rosey Bath gave patient 10 of ephedrine.

## 2016-02-08 NOTE — Telephone Encounter (Signed)
Called pt.  Doing well.  Will let us know if he needs anything.

## 2016-02-08 NOTE — Progress Notes (Signed)
Dr. Marry Guan ordered a internal med consult. Patient is now responding. Wife at bedside, she said this happened to patient at [facility] after surgery.

## 2016-02-08 NOTE — NC FL2 (Signed)
[place] LEVEL OF CARE SCREENING TOOL     IDENTIFICATION  Patient Name: Daniel Austin Birthdate: [birth year] Sex: male Admission Date (Current Location): 02/08/2016  [place] and [place] Number:  Engineering geologist and Address:  [facility], [address], [place], [place] [address]      Provider Number: [id number]  Attending Physician Name and Address:  Dereck Leep, MD  Relative Name and Phone Number:       Current Level of Care: Hospital Recommended Level of Care: [place] Prior Approval Number:    Date Approved/Denied:   PASRR Number:  ([id number])  Discharge Plan: SNF    Current Diagnoses: Patient Active Problem List   Diagnosis Date Noted  . S/P total knee arthroplasty 02/08/2016  . Chronic systolic heart failure ([place]) 12/13/2015  . HLD (hyperlipidemia) 12/13/2015  . BP (high blood pressure) 12/13/2015  . Cardiomyopathy, ischemic 12/13/2015  . MI (mitral incompetence) 12/13/2015  . Billowing mitral valve 12/13/2015  . Nonsustained ventricular tachycardia ([place]) 12/13/2015  . Carotid stenosis 12/13/2015  . Episode of syncope 12/13/2015  . Disease of thyroid gland 12/13/2015  . Health care maintenance 09/04/2015  . Encounter for completion of form with patient 01/12/2015  . Left arm numbness 09/30/2014  . Disturbance of skin sensation 09/30/2014  . Ischemia of upper extremity 09/30/2014  . Pins and needles sensation 09/30/2014  . Unspecified visual disturbance 09/30/2014  . Primary osteoarthritis of both knees 09/29/2014  . History of cardiac catheterization 05/12/2014  . Change in vision 01/09/2014  . Knee pain, bilateral 01/09/2014  . Arthralgia of lower leg 01/09/2014  . Left knee pain 09/13/2013  . Hyperglycemia 11/05/2012  . Carotid artery stenosis 11/05/2012  . Carotid artery narrowing 11/05/2012  . Carotid artery obstruction 11/05/2012  . Abnormal blood sugar 11/05/2012  .  Hypothyroidism 10/31/2012  . Pure hypercholesterolemia 10/31/2012  . Dysphagia, unspecified(787.20) 10/31/2012  . Coronary atherosclerosis of native coronary artery 10/31/2012  . Anemia 10/31/2012  . Essential hypertension, benign 10/31/2012  . Absolute anemia 10/31/2012  . Benign essential HTN 10/31/2012  . Can't get food down 10/31/2012  . Adult hypothyroidism 10/31/2012    Orientation RESPIRATION BLADDER Height & Weight     Self, Time, Situation, Place  Normal Incontinent, Indwelling catheter Weight: 200 lb (90.719 kg) Height:  5\' 10"  (177.8 cm)  BEHAVIORAL SYMPTOMS/MOOD NEUROLOGICAL BOWEL NUTRITION STATUS   (none )  (none ) Continent Diet (Regular )  AMBULATORY STATUS COMMUNICATION OF NEEDS Skin   Extensive Assist Verbally Surgical wounds (Incision: Right Knee )                       Personal Care Assistance Level of Assistance  Bathing, Feeding, Dressing Bathing Assistance: Limited assistance Feeding assistance: Independent Dressing Assistance: Limited assistance     Functional Limitations Info  Sight, Hearing, Speech Sight Info: Adequate Hearing Info: Adequate Speech Info: Adequate    SPECIAL CARE FACTORS FREQUENCY  PT (By licensed PT), OT (By licensed OT)     PT Frequency:  (5) OT Frequency:  (5)            Contractures      Additional Factors Info  Code Status, Allergies Code Status Info:  (Full Code. ) Allergies Info:  (Beta Adrenergic Blockers, Lipitor)           Current Medications (02/08/2016):  This is the current hospital active medication list Current Facility-Administered Medications  Medication Dose Route Frequency Provider  Last Rate Last Dose  . 0.9 %  sodium chloride infusion   Intravenous Continuous Dereck Leep, MD      . acetaminophen (OFIRMEV) IV 1,000 mg  1,000 mg Intravenous Q6H Dereck Leep, MD      . acetaminophen (TYLENOL) tablet 650 mg  650 mg Oral Q6H PRN Dereck Leep, MD       Or  . acetaminophen (TYLENOL)  suppository 650 mg  650 mg Rectal Q6H PRN Dereck Leep, MD      . alum & mag hydroxide-simeth (MAALOX/MYLANTA) 200-200-20 MG/5ML suspension 30 mL  30 mL Oral Q4H PRN Dereck Leep, MD      . bisacodyl (DULCOLAX) suppository 10 mg  10 mg Rectal Daily PRN Dereck Leep, MD      . ceFAZolin (ANCEF) IVPB 2g/100 mL premix  2 g Intravenous Q6H Dereck Leep, MD      . citalopram (CELEXA) tablet 20 mg  20 mg Oral Daily Dereck Leep, MD      . Derrill Memo ON 02/09/2016] clopidogrel (PLAVIX) tablet 75 mg  75 mg Oral Daily Dereck Leep, MD      . diphenhydrAMINE (BENADRYL) 12.5 MG/5ML elixir 12.5-25 mg  12.5-25 mg Oral Q4H PRN Dereck Leep, MD      . Derrill Memo ON 02/09/2016] enoxaparin (LOVENOX) injection 30 mg  30 mg Subcutaneous Q12H Dereck Leep, MD      . ferrous sulfate tablet 325 mg  325 mg Oral BID WC Dereck Leep, MD      . Derrill Memo ON 02/09/2016] levothyroxine (SYNTHROID, LEVOTHROID) tablet 112 mcg  112 mcg Oral QAC breakfast Dereck Leep, MD      . lisinopril (PRINIVIL,ZESTRIL) tablet 40 mg  40 mg Oral QHS Dereck Leep, MD      . loratadine (CLARITIN) tablet 10 mg  10 mg Oral Daily Dereck Leep, MD      . LORazepam (ATIVAN) tablet 1 mg  1 mg Oral QHS PRN Dereck Leep, MD      . magnesium hydroxide (MILK OF MAGNESIA) suspension 30 mL  30 mL Oral Daily PRN Dereck Leep, MD      . menthol-cetylpyridinium (CEPACOL) lozenge 3 mg  1 lozenge Oral PRN Dereck Leep, MD       Or  . phenol (CHLORASEPTIC) mouth spray 1 spray  1 spray Mouth/Throat PRN Dereck Leep, MD      . metoCLOPramide (REGLAN) tablet 10 mg  10 mg Oral TID AC & HS Dereck Leep, MD      . metoprolol tartrate (LOPRESSOR) tablet 25 mg  25 mg Oral BID Dereck Leep, MD      . morphine 2 MG/ML injection 2 mg  2 mg Intravenous Q2H PRN Dereck Leep, MD      . nitroGLYCERIN (NITROSTAT) SL tablet 0.4 mg  0.4 mg Sublingual Q5 min PRN Dereck Leep, MD      . ondansetron (ZOFRAN) tablet 4 mg  4 mg Oral Q6H PRN Dereck Leep,  MD       Or  . ondansetron (ZOFRAN) injection 4 mg  4 mg Intravenous Q6H PRN Dereck Leep, MD      . oxyCODONE (Oxy IR/ROXICODONE) immediate release tablet 5-10 mg  5-10 mg Oral Q4H PRN Dereck Leep, MD      . pantoprazole (PROTONIX) EC tablet 40 mg  40 mg Oral BID Dereck Leep, MD      .  rosuvastatin (CRESTOR) tablet 20 mg  20 mg Oral Daily Dereck Leep, MD      . senna-docusate (Senokot-S) tablet 1 tablet  1 tablet Oral BID Dereck Leep, MD      . sodium phosphate (FLEET) 7-19 GM/118ML enema 1 enema  1 enema Rectal Once PRN Dereck Leep, MD      . traMADol Veatrice Bourbon) tablet 50-100 mg  50-100 mg Oral Q4H PRN Dereck Leep, MD         Discharge Medications: Please see discharge summary for a list of discharge medications.  Relevant Imaging Results:  Relevant Lab Results:   Additional Information  (SSN: [id number])  Loralyn Freshwater, LCSW

## 2016-02-08 NOTE — Brief Op Note (Signed)
02/08/2016  2:53 PM  PATIENT:  Daniel Austin  75 y.o. male  PRE-OPERATIVE DIAGNOSIS:  OSTEOARTHRITIS RIGHT KNEE  POST-OPERATIVE DIAGNOSIS:  OSTEOARTHRITIS RIGHT KNEE  PROCEDURE:  Procedure(s): COMPUTER ASSISTED TOTAL KNEE ARTHROPLASTY (Right)  SURGEON:  Surgeon(s) and Role:    * Dereck Leep, MD - Primary  ASSISTANTS: Vance Peper, PA   ANESTHESIA:   spinal  EBL:  Total I/O In: 1000 [I.V.:1000] Out: 400 [Urine:200; Blood:200]  BLOOD ADMINISTERED:none  DRAINS: 2 medium drains to a reinfusion system.   LOCAL MEDICATIONS USED:  MARCAINE    and OTHER Exparel  SPECIMEN:  No Specimen  DISPOSITION OF SPECIMEN:  N/A  COUNTS:  YES  TOURNIQUET:   100 minutes  DICTATION: .Dragon Dictation  PLAN OF CARE: Admit to inpatient   PATIENT DISPOSITION:  PACU - hemodynamically stable.   Delay start of Pharmacological VTE agent (>24hrs) due to surgical blood loss or risk of bleeding: yes

## 2016-02-08 NOTE — Op Note (Signed)
OPERATIVE NOTE  DATE OF SURGERY:  02/08/2016  PATIENT NAME:  [name]   DOB: [birth year]  MRN: [id number]  PRE-OPERATIVE DIAGNOSIS: Degenerative arthrosis of the right knee, primary  POST-OPERATIVE DIAGNOSIS:  Same  PROCEDURE:  Right total knee arthroplasty using computer-assisted navigation  SURGEON:  Marciano Sequin. M.D.  ASSISTANT:  Vance Peper, PA (present and scrubbed throughout the case, critical for assistance with exposure, retraction, instrumentation, and closure)  ANESTHESIA: spinal  ESTIMATED BLOOD LOSS: 200 mL  FLUIDS REPLACED: 1700 mL of crystalloid  TOURNIQUET TIME: 100 minutes  DRAINS: 2 medium drains to a reinfusion system  SOFT TISSUE RELEASES: Anterior cruciate ligament, posterior cruciate ligament, deep and superficial medial collateral ligament, patellofemoral ligament   IMPLANTS UTILIZED: DePuy Attune size 8 posterior stabilized femoral component (cemented), size 8 rotating platform tibial component (cemented), 41 mm medialized dome patella (cemented), and a 41 mm stabilized rotating platform polyethylene insert.  INDICATIONS FOR SURGERY: IVOR Austin is a 75 y.o. year old male with a long history of progressive knee pain. X-rays demonstrated severe degenerative changes in tricompartmental fashion. The patient had not seen any significant improvement despite conservative nonsurgical intervention. After discussion of the risks and benefits of surgical intervention, the patient expressed understanding of the risks benefits and agree with plans for total knee arthroplasty.   The risks, benefits, and alternatives were discussed at length including but not limited to the risks of infection, bleeding, nerve injury, stiffness, blood clots, the need for revision surgery, cardiopulmonary complications, among others, and they were willing to proceed.  PROCEDURE IN DETAIL: The patient was brought into the operating room and, after adequate spinal anesthesia was  achieved, a tourniquet was placed on the patient's upper thigh. The patient's knee and leg were cleaned and prepped with alcohol and DuraPrep and draped in the usual sterile fashion. A "timeout" was performed as per usual protocol. The lower extremity was exsanguinated using an Esmarch, and the tourniquet was inflated to 300 mmHg. An anterior longitudinal incision was made followed by a standard mid vastus approach. The deep fibers of the medial collateral ligament were elevated in a subperiosteal fashion off of the medial flare of the tibia so as to maintain a continuous soft tissue sleeve. The patella was subluxed laterally and the patellofemoral ligament was incised. Inspection of the knee demonstrated severe degenerative changes with full-thickness loss of articular cartilage. Osteophytes were debrided using a rongeur. Anterior and posterior cruciate ligaments were excised. Two 4.0 mm Schanz pins were inserted in the femur and into the tibia for attachment of the array of trackers used for computer-assisted navigation. Hip center was identified using a circumduction technique. Distal landmarks were mapped using the computer. The distal femur and proximal tibia were mapped using the computer. The distal femoral cutting guide was positioned using computer-assisted navigation so as to achieve a 5 distal valgus cut. The femur was sized and it was felt that a size 8 femoral component was appropriate. A size 8 femoral cutting guide was positioned and the anterior cut was performed and verified using the computer. This was followed by completion of the posterior and chamfer cuts. Femoral cutting guide for the central box was then positioned in the center box cut was performed.  Attention was then directed to the proximal tibia. Medial and lateral menisci were excised. The extramedullary tibial cutting guide was positioned using computer-assisted navigation so as to achieve a 0 varus-valgus alignment and 3  posterior slope. The cut was performed and verified  using the computer. The proximal tibia was sized and it was felt that a size 8 tibial tray was appropriate. Tibial and femoral trials were inserted followed by insertion of a 5 mm polyethylene insert. The knee was felt to be tight medially. A Cobb elevator was used to elevate the superficial fibers of the medial collateral ligament. This allowed for excellent mediolateral soft tissue balancing both in flexion and in full extension. Finally, the patella was cut and prepared so as to accommodate a 41 mm medialized dome patella. A patella trial was placed and the knee was placed through a range of motion with excellent patellar tracking appreciated. The femoral trial was removed after debridement of posterior osteophytes. The central post-hole for the tibial component was reamed followed by insertion of a keel punch. Tibial trials were then removed. Cut surfaces of bone were irrigated with copious amounts of normal saline with antibiotic solution using pulsatile lavage and then suctioned dry. Polymethylmethacrylate cement was prepared in the usual fashion using a vacuum mixer. Cement was applied to the cut surface of the proximal tibia as well as along the undersurface of a size 8 rotating platform tibial component. Tibial component was positioned and impacted into place. Excess cement was removed using [employer] fast streamer. Cement was then applied to the cut surfaces of the femur as well as along the posterior flanges of the size 8 femoral component. The femoral component was positioned and impacted into place. Excess cement was removed using [employer] fast streamer. A 5 mm polyethylene trial was inserted and the knee was brought into full extension with steady axial compression applied. Finally, cement was applied to the backside of a 41 mm medialized dome patella and the patellar component was positioned and patellar clamp applied. Excess cement was removed using Air cabin crew. After adequate curing of the cement, the tourniquet was deflated after a total tourniquet time of 100 minutes. Hemostasis was achieved using electrocautery. The knee was irrigated with copious amounts of normal saline with antibiotic solution using pulsatile lavage and then suctioned dry. 20 mL of 1.3% Exparel in 40 mL of normal saline was injected along the posterior capsule, medial and lateral gutters, and along the arthrotomy site. A 5 mm stabilized rotating platform polyethylene insert was inserted and the knee was placed through a range of motion with excellent mediolateral soft tissue balancing appreciated and excellent patellar tracking noted. 2 medium drains were placed in the wound bed and brought out through separate stab incisions to be attached to a reinfusion system. The medial parapatellar portion of the incision was reapproximated using interrupted sutures of #1 Vicryl. Subcutaneous tissue was then injected with a total of 30 cc of 0.25% Marcaine with epinephrine. Subcutaneous tissue was approximated in layers using first #0 Vicryl followed #2-0 Vicryl. The skin was approximated with skin staples. A sterile dressing was applied.  The patient tolerated the procedure well and was transported to the recovery room in stable condition.    James P. Holley Bouche., M.D.

## 2016-02-08 NOTE — Progress Notes (Signed)
Spoke with Dr. Marry Guan to give update on patient. Patient 1st Autovac infiltrated in right arm. 2nd Autovac will be transfused. PO Protonix and Reglan will be changed to IV. Zofran will be changed to q4hr due to patient nausea/vomiting continuing. Will place patient on telemetry to monitor. Dr. Marry Guan to come to bedside.

## 2016-02-08 NOTE — H&P (Signed)
The patient has been re-examined, and the chart reviewed, and there have been no interval changes to the documented history and physical.    The risks, benefits, and alternatives have been discussed at length. The patient expressed understanding of the risks benefits and agreed with plans for surgical intervention.  James P. Hooten, Jr. M.D.    

## 2016-02-08 NOTE — Anesthesia Preprocedure Evaluation (Signed)
Anesthesia Evaluation  Patient identified by MRN, date of birth, ID band Patient awake    Reviewed: Allergy & Precautions, H&P , NPO status , Patient's Chart, lab work & pertinent test results  History of Anesthesia Complications Negative for: history of anesthetic complications  Airway Mallampati: II  TM Distance: >3 FB Neck ROM: limited    Dental  (+) Poor Dentition, Chipped   Pulmonary shortness of breath and with exertion,    Pulmonary exam normal breath sounds clear to auscultation       Cardiovascular Exercise Tolerance: Good hypertension, (-) angina+ CAD, + Cardiac Stents, + Peripheral Vascular Disease, +CHF and + DOE  Normal cardiovascular exam+ dysrhythmias + pacemaker II Rhythm:regular Rate:Normal     Neuro/Psych PSYCHIATRIC DISORDERS CVA, Residual Symptoms    GI/Hepatic Neg liver ROS, PUD, GERD  Controlled,  Endo/Other  Hypothyroidism   Renal/GU negative Renal ROS  negative genitourinary   Musculoskeletal  (+) Arthritis ,   Abdominal   Peds  Hematology negative hematology ROS (+)   Anesthesia Other Findings Past Medical History:   Hypercholesterolemia                                         Hypothyroidism                                               Hypertension                                                 Fractures, multiple                                            Comment:Metal rod in left foot   CAD (coronary artery disease)                                Carotid artery stenosis                                        Comment:high-grade symptomatic right sided s/p stent               twice   Syncope                                                        Comment:second degree heart block s/p St Jude's               pacemaker placement 12/08   Anemia  Ulcer of gastric fundus                                      Hyperglycemia                                                 Heart block                                                  Shortness of breath dyspnea                                  GERD (gastroesophageal reflux disease)                       Arthritis                                                    Knee pain                                                    Presence of permanent cardiac pacemaker                      Depression                                                   CHF (congestive heart failure) ([place])                           Comment:chronic   Cardiomyopathy ([place])                                         Stroke ([place])                                                   Comment:clot in neck broke off left side weakness 2008               resolved  Past Surgical History:   PACEMAKER INSERTION                              08/2007      HERNIA REPAIR  Comment:inguinal   PERCUTANEOUS PLACEMENT INTRAVASCULAR STENT CER*                 Comment:twice, right carotid artery   heart stint                                      11/04/2014      Comment:[facility]   PACEMAKER INSERTION                             N/A 11/16/2015      Comment:Procedure: INSERTION PACEMAKER/ PACEMAKER               CHANGE OUT;  Surgeon: Isaias Cowman, MD;               Location: [facility] ORS;  Service: Cardiovascular;                Laterality: N/A;   INSERT / REPLACE / [place]                                            Comment:stents x 3  BMI    Body Mass Index   28.69 kg/m 2      Reproductive/Obstetrics negative OB ROS                             Anesthesia Physical Anesthesia Plan  ASA: IV  Anesthesia Plan: Spinal   Post-op Pain Management:    Induction:   Airway Management Planned:   Additional Equipment:   Intra-op  Plan:   Post-operative Plan:   Informed Consent: I have reviewed the patients History and Physical, chart, labs and discussed the procedure including the risks, benefits and alternatives for the proposed anesthesia with the patient or authorized representative who has indicated his/her understanding and acceptance.   Dental Advisory Given  Plan Discussed with: Anesthesiologist, CRNA and Surgeon  Anesthesia Plan Comments: (Patient reports that he has not taken any plavix in the last 7 days.  Patient informed that they are higher risk for complications from anesthesia during this procedure due to their medical history.  Patient voiced understanding. )        Anesthesia Quick Evaluation

## 2016-02-08 NOTE — Progress Notes (Signed)
RN spoke with Dr. Clydell Hakim OR nurse about patient's BP 88/54 and HR of 71. Dr. Marry Guan ordered 500cc bolus of normal saline.   Deri Fuelling, RN

## 2016-02-08 NOTE — Progress Notes (Signed)
RN spoke wit internal medicine, they are on their wa up.

## 2016-02-08 NOTE — Progress Notes (Signed)
Patient had BP 83/53, Dr. Marry Guan was notifies, he ordered a bolus of normal saline. RN administered. Patient was talking and getting ready to order food tray. Patient complained of being dizzy. Patient wen unresponsive. Making gasping noises. Rapid response called. Blood sugar 114. BP 80/43.         1712 BP  54/37

## 2016-02-09 ENCOUNTER — Encounter: Payer: Self-pay | Admitting: Orthopedic Surgery

## 2016-02-09 LAB — BASIC METABOLIC PANEL
Anion gap: 5 (ref 5–15)
BUN: 10 mg/dL (ref 6–20)
CALCIUM: 8.1 mg/dL — AB (ref 8.9–10.3)
CHLORIDE: 102 mmol/L (ref 101–111)
CO2: 25 mmol/L (ref 22–32)
CREATININE: 0.88 mg/dL (ref 0.61–1.24)
GFR calc non Af Amer: >60 mL/min (ref >60)
Glucose, Bld: 143 mg/dL — ABNORMAL HIGH (ref 65–99)
Potassium: 4.1 mmol/L (ref 3.5–5.1)
SODIUM: 132 mmol/L — AB (ref 135–145)

## 2016-02-09 LAB — CBC
HCT: 28.1 % — ABNORMAL LOW (ref 40.0–52.0)
HEMOGLOBIN: 9.6 g/dL — AB (ref 13.0–18.0)
MCH: 29.2 pg (ref 26.0–34.0)
MCHC: 34 g/dL (ref 32.0–36.0)
MCV: 85.9 fL (ref 80.0–100.0)
Platelets: 168 10*3/uL (ref 150–440)
RBC: 3.27 MIL/uL — ABNORMAL LOW (ref 4.40–5.90)
RDW: 13.6 % (ref 11.5–14.5)
WBC: 5.4 10*3/uL (ref 3.8–10.6)

## 2016-02-09 NOTE — Evaluation (Signed)
Physical Therapy Evaluation Patient Details Name: Daniel Austin MRN: [id number] DOB: 01/25/1941 Today's Date: 02/09/2016   History of Present Illness  Pt is a 75 y.o. male s/p elective R TKA secondary to OA 02/08/16.  Pt with low BP post-up and rapid response called (pt was dizzy and went unresponsive) 02/08/16 PM.  PMH includes SOB with exertion, CVA, L sided weakness (from 2008), metal rod L foot/ankle, CAD, pacemaker, heart block.  Clinical Impression  Prior to admission, pt was modified independent ambulating with B SPC's.  Pt lives with his wife in 1 level home with stairs to enter.  Pt reports h/o "passing out" after medical procedures/surgeries.  Currently pt is min assist supine to/from sit.  Nursing requesting to hold transfers at this time d/t BP/medical concerns (will attempt further functional mobility assessment this afternoon as medically appropriate).  Pt would benefit from skilled PT to address noted impairments and functional limitations.  Recommend pt discharge to STR (pending pt's progress and further assessment) when medically appropriate.     Follow Up Recommendations  (STR pending pt's progress/further assessment)    Equipment Recommendations       Recommendations for Other Services       Precautions / Restrictions Precautions Precautions: Knee;Fall Precaution Booklet Issued: Yes (comment) Restrictions Weight Bearing Restrictions: Yes RLE Weight Bearing: Weight bearing as tolerated      Mobility  Bed Mobility Overal bed mobility: Needs Assistance Bed Mobility: Supine to Sit;Sit to Supine     Supine to sit: Min assist;HOB elevated Sit to supine: Min assist;HOB elevated   General bed mobility comments: assist for R LE and vc's to breath required  Transfers                 General transfer comment: deferred (nursing requesting to hold transfers at this time d/t BP concerns)  Ambulation/Gait                Stairs             Wheelchair Mobility    Modified Rankin (Stroke Patients Only)       Balance Overall balance assessment: Needs assistance Sitting-balance support: Bilateral upper extremity supported;Feet supported Sitting balance-Leahy Scale: Good Sitting balance - Comments: pt sat edge of bed x8 minutes SBA to CGA for safety (no significant change in BP noted 1 minute after sitting up to about 5 minutes after sitting up)                                     Pertinent Vitals/Pain Pain Assessment: 0-10 Pain Score: 2  Pain Location: R knee pain Pain Descriptors / Indicators: Sore;Tender;Operative site guarding Pain Intervention(s): Limited activity within patient's tolerance;Monitored during session;Premedicated before session;Repositioned;Ice applied  See flow sheet for HR, O2 and BP vitals.    Home Living Family/patient expects to be discharged to:: Private residence Living Arrangements: Spouse/significant other Available Help at Discharge: Family Type of Home: House Home Access: Stairs to enter Entrance Stairs-Rails: Psychiatric nurse of Steps: [place]: One level Home Equipment: Environmental consultant - 2 wheels;Walker - 4 wheels;Shower seat - built in;Grab bars - toilet      Prior Function Level of Independence: Independent with assistive device(s)         Comments: Pt ambulates with B SPC's.  Pt reports h/o "passing out" especially after medical procedures.     Hand Dominance  Extremity/Trunk Assessment   Upper Extremity Assessment: Defer to OT evaluation           Lower Extremity Assessment: RLE deficits/detail;LLE deficits/detail RLE Deficits / Details: R hip flexion at least 3+/5; R knee flexion/extension at least 3/5; R DF at least 4/5; able to perform x10 R LE SLR independently LLE Deficits / Details: L LE strength and ROM WFL  Cervical / Trunk Assessment: Normal  Communication   Communication: No difficulties  Cognition  Arousal/Alertness: Awake/alert Behavior During Therapy: WFL for tasks assessed/performed Overall Cognitive Status: Within Functional Limits for tasks assessed                      General Comments General comments (skin integrity, edema, etc.): R knee dressings and hemovac in place; R UE elevated on pillows and being iced (pt reports d/t being infiltrated last night); R UE swelling noted.    Exercises Total Joint Exercises Ankle Circles/Pumps: AROM;Strengthening;Both;10 reps;Supine Quad Sets: AROM;Strengthening;Both;10 reps;Supine Short Arc Quad: AROM;Strengthening;Both;10 reps;Supine Heel Slides: Strengthening;Both;10 reps;Supine (AAROM R; AROM L) Hip ABduction/ADduction: AROM;Strengthening;Both;10 reps;Supine Straight Leg Raises: AROM;Strengthening;Both;10 reps;Supine Goniometric ROM: R knee extension 15 degrees short of neutral and R knee flexion 77 degrees (all semi-supine in bed)      Assessment/Plan    PT Assessment Patient needs continued PT services  PT Diagnosis Difficulty walking;Acute pain   PT Problem List Decreased strength;Decreased range of motion;Decreased activity tolerance;Decreased balance;Decreased mobility;Pain  PT Treatment Interventions DME instruction;Gait training;Stair training;Functional mobility training;Therapeutic activities;Therapeutic exercise;Patient/family education;Balance training   PT Goals (Current goals can be found in the Care Plan section) Acute Rehab PT Goals Patient Stated Goal: to go home PT Goal Formulation: With patient Time For Goal Achievement: 02/23/16 Potential to Achieve Goals: Fair    Frequency BID   Barriers to discharge        Co-evaluation               End of Session Equipment Utilized During Treatment: Gait belt;Oxygen (2 L/min via nasal cannula) Activity Tolerance: Patient tolerated treatment well Patient left: in bed;with call bell/phone within reach;with bed alarm set;with nursing/sitter in room;with  family/visitor present;with SCD's reapplied (B heels elevated via towel rolls; polar care in place and activated) Nurse Communication: Mobility status;Precautions;Weight bearing status         Time: [id number] PT Time Calculation (min) (ACUTE ONLY): 42 min   Charges:   PT Evaluation $PT Eval Moderate Complexity: 1 Procedure PT Treatments $Therapeutic Exercise: 8-22 mins $Therapeutic Activity: 8-22 mins   PT G CodesLeitha Bleak March 03, 2016, 9:55 AM [name], [place]

## 2016-02-09 NOTE — Progress Notes (Signed)
[facility] [place] NAME: Cadyn Shadowens    MR#:  [id number]  DATE OF BIRTH:  [birth year]  SUBJECTIVE:  CHIEF COMPLAINT:  No chief complaint on file. No complaints, minimal pain at the surgical site  REVIEW OF SYSTEMS:  Review of Systems  Constitutional: Negative for fever, weight loss, malaise/fatigue and diaphoresis.  HENT: Negative for ear discharge, ear pain, hearing loss, nosebleeds, sore throat and tinnitus.   Eyes: Negative for blurred vision and pain.  Respiratory: Negative for cough, hemoptysis, shortness of breath and wheezing.   Cardiovascular: Negative for chest pain, palpitations, orthopnea and leg swelling.  Gastrointestinal: Negative for heartburn, nausea, vomiting, abdominal pain, diarrhea, constipation and blood in stool.  Genitourinary: Negative for dysuria, urgency and frequency.  Musculoskeletal: Negative for myalgias and back pain.  Skin: Negative for itching and rash.  Neurological: Negative for dizziness, tingling, tremors, focal weakness, seizures, weakness and headaches.  Psychiatric/Behavioral: Negative for depression. The patient is not nervous/anxious.     DRUG ALLERGIES:   Allergies  Allergen Reactions  . Beta Adrenergic Blockers Other (See Comments)    Second Degree Heart Block  . Lipitor [Atorvastatin] Other (See Comments)    Myalgia Other Reaction: myalgia Myalgia    VITALS:  Blood pressure 122/58, pulse 92, temperature 97.8 F (36.6 C), temperature source Oral, resp. rate 15, height 5\' 10"  (1.778 m), weight 90.719 kg (200 lb), SpO2 98 %. PHYSICAL EXAMINATION:  Physical Exam  Constitutional: He is oriented to person, place, and time and well-developed, well-nourished, and in no distress.  HENT:  Head: Normocephalic and atraumatic.  Eyes: Conjunctivae and EOM are normal. Pupils are equal, round, and reactive to light.  Neck: Normal range of motion. Neck supple. No tracheal deviation  present. No thyromegaly present.  Cardiovascular: Normal rate, regular rhythm and normal heart sounds.   Pulmonary/Chest: Effort normal and breath sounds normal. No respiratory distress. He has no wheezes. He exhibits no tenderness.  Abdominal: Soft. Bowel sounds are normal. He exhibits no distension. There is no tenderness.  Musculoskeletal: Normal range of motion.  Neurological: He is alert and oriented to person, place, and time. No cranial nerve deficit.  Skin: Skin is warm and dry. No rash noted.     Over Rt Knee - dressing clean, dry, no drainage,  Hemovac intact.  Psychiatric: Mood and affect normal.   LABORATORY PANEL:   CBC  Recent Labs Lab 02/09/16 0348  WBC 5.4  HGB 9.6*  HCT 28.1*  PLT 168   ------------------------------------------------------------------------------------------------------------------ Chemistries   Recent Labs Lab 02/09/16 0348  NA 132*  K 4.1  CL 102  CO2 25  GLUCOSE 143*  BUN 10  CREATININE 0.88  CALCIUM 8.1*   RADIOLOGY:  Dg Knee Right Port  02/08/2016  CLINICAL DATA:  Status post right knee replacement EXAM: PORTABLE RIGHT KNEE - 1-2 VIEW COMPARISON:  None. FINDINGS: Right knee prosthesis is seen. No acute bony abnormality is noted. Surgical drains are noted in place. IMPRESSION: No acute abnormality noted. Electronically Signed   By: Inez Catalina M.D.   On: 02/08/2016 15:24   ASSESSMENT AND PLAN:  Maggie Krippner is a 75 y.o. male with a known history of DJD, coronary artery disease, anemia, hypertension, hyperlipidemia, was admitted on the order service for elective right total knee arthroplasty. Patient underwent surgery under spinal anesthesia.  1. Persistent hypotension -Patient is status post right knee total arthroplasty elective surgery done under spinal anesthesia - BP in 90's. -Patient is  asymptomatic we'll continue to monitor -Hold off BP meds, stopped metoprolol, lisinopril and nitroglycerin  2. History of  CAD -Continue cardiac meds after blood pressure stable  3. Hypothyroidism on Synthroid  4. Hyperlipidemia on Crestor  5. DVT prophylaxis per ortho     All the records are reviewed and case discussed with Care Management/Social Worker. Management plans discussed with the patient, family and they are in agreement.  CODE STATUS: Full code  TOTAL TIME TAKING CARE OF THIS PATIENT: 15 minutes.   More than 50% of the time was spent in counseling/coordination of care: YES  D/C per primary team   [facility], Sender Rueb M.D on 02/09/2016 at 11:07 AM  Between 7am to 6pm - Pager - [phone]  After 6pm go to [email or web address] - password [email or web address] [facility]  Office  [phone]  CC: Primary care physician; Einar Pheasant, MD  Note: This dictation was prepared with Dragon dictation along with smaller phrase technology. Any transcriptional errors that result from this process are unintentional.

## 2016-02-09 NOTE — Plan of Care (Signed)
Problem: Physical Regulation: Goal: Ability to maintain clinical measurements within normal limits will improve Outcome: Progressing Able to maintain blood pressure.  Problem: Bowel/Gastric: Goal: Will not experience complications related to bowel motility Outcome: Not Progressing Still pending bowel movement post surgery  Problem: Bowel/Gastric: Goal: Gastrointestinal status for postoperative course will improve Outcome: Not Progressing Pending bowel movement post surgery

## 2016-02-09 NOTE — Evaluation (Signed)
Occupational Therapy Evaluation Patient Details Name: Daniel Austin MRN: [id number] DOB: 1940/12/01 Today's Date: 02/09/2016    History of Present Illness Pt is a 75 y.o. male s/p elective R TKA secondary to OA 02/08/16.  Pt with low BP post-op and rapid response called (pt was dizzy and went unresponsive) 02/08/16 PM.  PMH includes SOB with exertion, CVA, L sided weakness (from 2008), metal rod L foot/ankle, CAD, pacemaker, heart block.   Clinical Impression   Pt is 75 year old male s/p R TKR.  Pt was independent in all ADLs prior to surgery and is eager to return to PLOF.  Pt currently requires moderate assist for LB dressing while in seated position due to limited AROM of R knee.  Pt would benefit from instruction in dressing techniques with or without assistive devices for dressing and bathing skills.  Pt would also benefit from recommendations for home modifications to increase safety in the bathroom and prevent falls. 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 Pt reports "not sure if or when I'll go back to work." Will assess for STR needs as pt progresses in therapy.      Follow Up Recommendations   (STR pending pt progress)    Equipment Recommendations   (recommend possible AE, reacher, sock aid, pending pt progress and family/friends may have AE he can borrow)    Recommendations for Other Services       Precautions / Restrictions Precautions Precautions: Knee;Fall Restrictions Weight Bearing Restrictions: Yes RLE Weight Bearing: Weight bearing as tolerated      Mobility Bed Mobility Overal bed mobility: Needs Assistance Bed Mobility: Supine to Sit     Supine to sit: HOB elevated;Min guard     General bed mobility comments: assist for R LE and VC for breathing, educated pt on hand placement on bed rails  Transfers                  General transfer comment: deferred d/t BP concerns this am, PT to assess    Balance Overall balance assessment: Needs assistance Sitting-balance support: Bilateral upper extremity supported;Feet supported Sitting balance-Leahy Scale: Good Sitting balance - Comments: Pt sat EOB x15 min SBA for safety with no significant changes in BP noted                                    ADL Overall ADL's : Needs assistance/impaired     Grooming: Oral care;Set up;Sitting           Upper Body Dressing : Set up   Lower Body Dressing: Moderate assistance;Cueing for compensatory techniques Lower Body Dressing Details (indicate cue type and reason): Would benefit from instruction in use of AE to improve safety and performance with LB self care             Functional mobility during ADLs:  (Did not attempt OOB mobility d/t previous low BP, PT to assess)       Vision Vision Assessment?: No apparent visual deficits   Perception     Praxis      Pertinent Vitals/Pain Pain Assessment: No/denies pain Pain Location: R knee Pain Intervention(s): Limited activity within patient's tolerance;Monitored during session;Ice applied     Hand Dominance Right   Extremity/Trunk Assessment Upper Extremity Assessment  Upper Extremity Assessment: Overall WFL for tasks assessed   Lower Extremity Assessment Lower Extremity Assessment: Defer to PT evaluation   Cervical / Trunk Assessment Cervical / Trunk Assessment: Normal   Communication Communication Communication: No difficulties   Cognition Arousal/Alertness: Awake/alert Behavior During Therapy: WFL for tasks assessed/performed Overall Cognitive Status: Within Functional Limits for tasks assessed                     General Comments       Exercises       Shoulder Instructions      Home Living Family/patient expects to be discharged to:: Private residence Living Arrangements: Spouse/significant  other Available Help at Discharge: Family Type of Home: House Home Access: Stairs to enter Technical brewer of Steps: 4 Entrance Stairs-Rails: Right;Left Home Layout: One level     Bathroom Shower/Tub: Walk-in shower;Curtain (handicap accessible shower, built in seat in front and back of shower)   Bathroom Toilet: Standard Bathroom Accessibility: Yes   Home Equipment: Environmental consultant - 2 wheels;Walker - 4 wheels;Shower seat - built in;Grab bars - toilet;Toilet riser (Toilet riser with bilateral arm rails)          Prior Functioning/Environment Level of Independence: Independent with assistive device(s)        Comments: Pt ambulates with B SPC's.  Pt reports h/o "passing out" especially after medical procedures. Was working and driving     OT Diagnosis: Generalized weakness   OT Problem List: Decreased range of motion;Decreased activity tolerance;Impaired balance (sitting and/or standing);Decreased knowledge of use of DME or AE;Cardiopulmonary status limiting activity;Increased edema (edema in RUE d/t being infiltrated last night per pt report)   OT Treatment/Interventions: Self-care/ADL training;Energy conservation;DME and/or AE instruction;Patient/family education;Therapeutic activities    OT Goals(Current goals can be found in the care plan section) Acute Rehab OT Goals Patient Stated Goal: to go home OT Goal Formulation: With patient/family Time For Goal Achievement: 02/23/16 Potential to Achieve Goals: Good  OT Frequency: Min 1X/week   Barriers to D/C:            Co-evaluation              OT Treatment   End of Session Pt instructed in pursed lip breathing techniques with demonstrated understanding and pt/caregiver education on energy conservation strategies to improve safety and performance in ADL with pt/caregiver verbalizing understanding.  Equipment Utilized During Treatment: Oxygen  Activity Tolerance: Patient tolerated treatment well Patient left:  in bed;with call bell/phone within reach;with bed alarm set;with family/visitor present (Pt left sitting EOB with family and PT present for PT session)   Time: 1350-1445 OT Time Calculation (min): 55 min Charges:  OT General Charges $OT Visit: 1 Procedure OT Evaluation $OT Eval Moderate Complexity: 1 Procedure OT Treatments $Therapeutic Activity: 8-22 mins G-Codes:    Corky Sox, OTR/L 02/09/2016, 3:10 PM

## 2016-02-09 NOTE — Care Management Note (Signed)
Case Management Note  Patient Details  Name: Daniel Austin MRN: [id number] Date of Birth: Jun 11, 1941  Subjective/Objective:     75yo [name] received a right TKA by Dr Marry Guan on 02/08/16. Resides at home with his wife. PCP=Charlene Scott. Pharmacy=[facility] in [place]. Has a RW, crutches, and a raised toilet seat at home. No home oxygen.No home health services. [place]. Wife will provide transportation to appointments. A referral was called to Corliss Blacker at [place]. Case management will follow for discharge planning.                  Action/Plan:   Expected Discharge Date:                  Expected Discharge Plan:     In-House Referral:     Discharge planning Services     Post Acute Care Choice:    Choice offered to:     DME Arranged:    DME Agency:     HH Arranged:    [place] Agency:     Status of Service:     Medicare Important Message Given:  Yes Date Medicare IM Given:    Medicare IM give by:    Date Additional Medicare IM Given:    Additional Medicare Important Message give by:     If discussed at [place] of Stay Meetings, dates discussed:    Additional Comments:  Ashe Graybeal A, RN 02/09/2016, 2:47 PM

## 2016-02-09 NOTE — Anesthesia Postprocedure Evaluation (Signed)
Anesthesia Post Note  Patient: Daniel Austin  Procedure(s) Performed: Procedure(s) (LRB): COMPUTER ASSISTED TOTAL KNEE ARTHROPLASTY (Right)  Patient location during evaluation: Other Anesthesia Type: Spinal Level of consciousness: oriented and awake and alert Pain management: pain level controlled Vital Signs Assessment: post-procedure vital signs reviewed and stable Respiratory status: spontaneous breathing, respiratory function stable and patient connected to nasal cannula oxygen Cardiovascular status: blood pressure returned to baseline and stable Postop Assessment: no headache and no backache Anesthetic complications: no    Last Vitals:  Filed Vitals:   02/09/16 0540 02/09/16 0542  BP: 96/47 92/50  Pulse: 87 85  Temp: 36.8 C   Resp: 18     Last Pain:  Filed Vitals:   02/09/16 0553  PainSc: 2                  Alison Stalling

## 2016-02-09 NOTE — Care Management Important Message (Signed)
Important Message  Patient Details  Name: Daniel Austin MRN: [id number] Date of Birth: September 04, 1941   Medicare Important Message Given:  Yes    Jacole Capley A, RN 02/09/2016, 7:01 AM

## 2016-02-09 NOTE — Clinical Social Work Note (Addendum)
Clinical Social Work Assessment  Patient Details  Name: Daniel Austin MRN: [id number] Date of Birth: Jul 13, 1941  Date of referral:  02/09/16               Reason for consult:  Facility Placement                Permission sought to share information with:  Chartered certified accountant granted to share information::  Yes, Verbal Permission Granted  Name::      [place]::   [place]   Relationship::     Contact Information:     Housing/Transportation Living arrangements for the past 2 months:  [place] of Information:  Patient, Spouse Patient Interpreter Needed:  None Criminal Activity/Legal Involvement Pertinent to Current Situation/Hospitalization:  No - Comment as needed Significant Relationships:  Adult Children, Spouse Lives with:  Spouse Do you feel safe going back to the place where you live?  Yes Need for family participation in patient care:  Yes (Comment)  Care giving concerns:  Patient lives in [place] with his wife Glenda [phone].    Social Worker assessment / plan:  Holiday representative (CSW) received SNF consult. PT is recommending SNF. CSW met with patient and his wife Holley Raring was at bedside. CSW introduced self and explained role of CSW department. Patient was alert and oriented and was sitting up in the bed. Per patient he lives with his wife Holley Raring in [place] and they are from [place]. Per patient his daughter Cephus Richer is at [facility] eating lunch and will be up shortly. CSW explained that PT is recommending SNF. Patient and wife stated that they prefer for patient to come home. CSW explained that PT is recommending SNF based on patient's limited mobility and if he improves and can walk around the nurses station then he can likely go home. Patient and wife are agreeable to SNF search however they prefer for patient to come home. CSW explained that ultimately patient can go home however patient  was encouraged to consider PT and MD's recommendation.    FL2 complete and faxed out. CSW presented bed offers to patient and wife. Per patient he will discuss offers with his daughter and get to [place]. CSW will continue to follow and assist as needed.   Employment status:  Retired Forensic scientist:  Medicare PT Recommendations:  [place] / Referral to community resources:  [place]  Patient/Family's Response to care: Patient prefers to go home however he is agreeable to [facility] in [place].   Patient/Family's Understanding of and Emotional Response to Diagnosis, Current Treatment, and Prognosis:  Patient and wife were pleasant and thanked CSW for visit.   Emotional Assessment Appearance:  Appears stated age Attitude/Demeanor/Rapport:    Affect (typically observed):  Accepting, Adaptable, Pleasant Orientation:  Oriented to Self, Oriented to Place, Oriented to  Time, Oriented to Situation Alcohol / Substance use:  Not Applicable Psych involvement (Current and /or in the community):  No (Comment)  Discharge Needs  Concerns to be addressed:  Discharge Planning Concerns Readmission within the last 30 days:  No Current discharge risk:  Dependent with Mobility Barriers to Discharge:  Continued Medical Work up   Loralyn Freshwater, LCSW 02/09/2016, 1:39 PM

## 2016-02-09 NOTE — Progress Notes (Signed)
Patient states that a doctor came in and told him that he would stop the blood thinners.  Spoke with Dr. Manuella Ghazi, Rolena Infante and also Dr. Marry Guan. Per Dr. Marry Guan suspend Plavix until first dressing change and proceed with giving Lovenox.

## 2016-02-09 NOTE — Clinical Social Work Placement (Signed)
   CLINICAL SOCIAL WORK PLACEMENT  NOTE  Date:  02/09/2016  Patient Details  Name: Daniel Austin MRN: [id number] Date of Birth: January 05, 1941  Clinical Social Work is seeking post-discharge placement for this patient at the [place] level of care (*CSW will initial, date and re-position this form in  chart as items are completed):  Yes   Patient/family provided with [place] Work Department's list of facilities offering this level of care within the geographic area requested by the patient (or if unable, by the patient's family).  Yes   Patient/family informed of their freedom to choose among providers that offer the needed level of care, that participate in Medicare, Medicaid or managed care program needed by the patient, have an available bed and are willing to accept the patient.  Yes   Patient/family informed of [place]'s ownership interest in [facility] : [facility] and [facility], as well as of the fact that they are under no obligation to receive care at these facilities.  PASRR submitted to EDS on 02/09/16     PASRR number received on 02/09/16     Existing PASRR number confirmed on       FL2 transmitted to all facilities in geographic area requested by pt/family on 02/09/16     FL2 transmitted to all facilities within larger geographic area on       Patient informed that his/her managed care company has contracts with or will negotiate with certain facilities, including the following:            Patient/family informed of bed offers received.  Patient chooses bed at       Physician recommends and patient chooses bed at      Patient to be transferred to   on  .  Patient to be transferred to facility by       Patient family notified on   of transfer.  Name of family member notified:        PHYSICIAN       Additional Comment:    _______________________________________________ Loralyn Freshwater, LCSW 02/09/2016, 1:38 PM

## 2016-02-09 NOTE — Progress Notes (Signed)
Discussed with Dr. Manuella Ghazi patients blood pressure variations from lying to sitting and that patient became diaphoretic while sitting on side of bed with PT.  Questioned if fluids should continue since blood pressure is still low and patient has history of CHF.  Advised to decrease rate to 60 ml/hr to aid in blood pressure.

## 2016-02-09 NOTE — Progress Notes (Signed)
Subjective: 1 Day Post-Op Procedure(s) (LRB): COMPUTER ASSISTED TOTAL KNEE ARTHROPLASTY (Right) Patient reports pain as moderate.   Patient seen in rounds with Dr. Marry Guan. Patient is well, and has had no acute complaints or problems. The patient is being treated for postoperative hypotension. Medicine is following. The patient does not experience any lightheadedness while in bed. The patient did become faint with standing yesterday. Plan is to go Home after hospital stay. Negative for chest pain and shortness of breath Fever: no Gastrointestinal: Negative for nausea and vomiting  Objective: Vital signs in last 24 hours: Temp:  [97.6 F (36.4 C)-98.3 F (36.8 C)] 98.3 F (36.8 C) (05/18 0540) Pulse Rate:  [60-93] 85 (05/18 0542) Resp:  [11-18] 18 (05/18 0540) BP: (54-144)/(36-97) 92/50 mmHg (05/18 0542) SpO2:  [94 %-100 %] 98 % (05/18 0540) Weight:  [90.719 kg (200 lb)] 90.719 kg (200 lb) (05/17 1020)  Intake/Output from previous day:  Intake/Output Summary (Last 24 hours) at 02/09/16 0616 Last data filed at 02/09/16 0559  Gross per 24 hour  Intake 3241.33 ml  Output   2135 ml  Net 1106.33 ml    Intake/Output this shift: Total I/O In: 2241.3 [I.V.:1303.3; Blood:571; Other:367] Out: [id number] [Urine:600; Drains:585]  Labs:  Recent Labs  02/08/16 1017 02/09/16 0348  HGB 13.6 9.6*    Recent Labs  02/08/16 1017 02/09/16 0348  WBC  --  5.4  RBC  --  3.27*  HCT 40.0 28.1*  PLT  --  168    Recent Labs  02/08/16 1017 02/09/16 0348  NA 138 132*  K 4.0 4.1  CL  --  102  CO2  --  25  BUN  --  10  CREATININE  --  0.88  GLUCOSE 94 143*  CALCIUM  --  8.1*   No results for input(s): LABPT, INR in the last 72 hours.   EXAM General - Patient is Alert and Oriented Extremity - Sensation intact distally Dorsiflexion/Plantar flexion intact Compartment soft Dressing/Incision - clean, dry, no drainage, the patient does have a Hemovac intact. He received 2 Autovac  transfusions yesterday with his first one infiltrating in his arm. The were able to give him the remaining 150 cc for the second transfusion with Autovac. Motor Function - intact, moving foot and toes well on exam. The patient is able to do a straight leg raise independently.  Past Medical History  Diagnosis Date  . Hypercholesterolemia   . Hypothyroidism   . Hypertension   . Fractures, multiple     Metal rod in left foot  . CAD (coronary artery disease)   . Carotid artery stenosis     high-grade symptomatic right sided s/p stent twice  . Syncope     second degree heart block s/p St Jude's pacemaker placement 12/08  . Anemia   . Ulcer of gastric fundus   . Hyperglycemia   . Heart block   . Shortness of breath dyspnea   . GERD (gastroesophageal reflux disease)   . Arthritis   . Knee pain   . Presence of permanent cardiac pacemaker   . Depression   . CHF (congestive heart failure) (HCC)     chronic  . Cardiomyopathy ([place])   . Stroke [facility])     clot in neck broke off left side weakness 2008  resolved    Assessment/Plan: 1 Day Post-Op Procedure(s) (LRB): COMPUTER ASSISTED TOTAL KNEE ARTHROPLASTY (Right) Active Problems:   S/P total knee arthroplasty  Estimated body mass index is 28.7 kg/(m^2)  as calculated from the following:   Height as of this encounter: 5\' 10"  (1.778 m).   Weight as of this encounter: 90.719 kg (200 lb). Advance diet Up with therapy D/C IV fluids Plan for discharge tomorrow , but we will continue to watch for hypertension with medicine following. Presently only receiving IV Tylenol for pain control and no blood pressure medication because of his hypertension.  DVT Prophylaxis - Lovenox, Foot Pumps and TED hose Weight-Bearing as tolerated to right leg  Reche Dixon, PA-C Orthopaedic Surgery 02/09/2016, 6:16 AM

## 2016-02-09 NOTE — Progress Notes (Signed)
Physical Therapy Treatment Patient Details Name: Daniel Austin MRN: [id number] DOB: May 07, 1941 Today's Date: 02/09/2016    History of Present Illness Pt is a 75 y.o. male s/p elective R TKA secondary to OA 02/08/16.  Pt with low BP post-op and rapid response called (pt was dizzy and went unresponsive) 02/08/16 PM.  PMH includes SOB with exertion, CVA, L sided weakness (from 2008), metal rod L foot/ankle, CAD, pacemaker, heart block.    PT Comments    Pt received sitting on edge of bed after OT session.  Prior to initiating standing, pt performed LE sitting ex's sitting edge of bed and pt became SOB (pt reports this is his baseline).  Pt stayed SOB and suddenly reporting needing to lay down (pt hot and sweaty and pt's daughter reports pt gets this way just prior to passing out).  Pt's BP decreased from 110/80 sitting to 97/47 laying in bed.  Pt's HR decreased from 98 bpm sitting to 83 bpm laying in bed.  Pt's BP improved to 101-113/52-72 and HR in 90's (bpm) rest of session laying in bed and pt appeared to tolerate LE ex's in bed fairly well with minimal SOB noted.  Nursing notified of all of the vitals above and pt's tolerance to therapy activity.  Next session will attempt standing and progress to ambulation per pt tolerance and as medically appropriate.   Follow Up Recommendations   (STR pending further assessment/progress)     Equipment Recommendations       Recommendations for Other Services       Precautions / Restrictions Precautions Precautions: Knee;Fall Precaution Booklet Issued: Yes (comment) Precaution Comments: h/o syncope post surgery/procedure Restrictions Weight Bearing Restrictions: Yes RLE Weight Bearing: Weight bearing as tolerated    Mobility  Bed Mobility Overal bed mobility: Needs Assistance Bed Mobility: Sit to Supine     Sit to supine: Min assist;HOB elevated   General bed mobility comments: assist for R LE and VC for breathing  Transfers                  General transfer comment: deferred d/t pt feeling hot and sweaty and requesting to lay down  Ambulation/Gait                 Stairs            Wheelchair Mobility    Modified Rankin (Stroke Patients Only)       Balance Overall balance assessment: Needs assistance Sitting-balance support: Bilateral upper extremity supported;Feet supported Sitting balance-Leahy Scale: Good Sitting balance - Comments: Pt sat EOB x10 minutes before needing to lay down.                            Cognition Arousal/Alertness: Awake/alert Behavior During Therapy: WFL for tasks assessed/performed Overall Cognitive Status: Within Functional Limits for tasks assessed                      Exercises Total Joint Exercises Ankle Circles/Pumps: AROM;Strengthening;Both;Supine (2x10 reps) Quad Sets: AROM;Strengthening;Both;Supine (2x10 reps) Short Arc Quad: AROM;Strengthening;Both;Supine (2x10 reps) Heel Slides: Strengthening;Both;Supine (2x10 reps; AAROM R; AROM L) Hip ABduction/ADduction: AROM;Strengthening;Both;Supine (2x10 reps) Straight Leg Raises: AROM;Strengthening;Both;Supine (2x10 reps) General Exercises - Lower Extremity Ankle Circles/Pumps: AROM;Strengthening;Both;10 reps;Seated Long Arc Quad: AROM;Strengthening;Both;10 reps;Seated Hip Flexion/Marching: AROM;Strengthening;Both;10 reps;Seated    General Comments General comments (skin integrity, edema, etc.): R knee dressings and hemovac in place; R UE swelling  Pt agreeable to PT  session and pt's daughter and wife present during session.      Pertinent Vitals/Pain Pain Assessment: 0-10 Pain Score: 2  Pain Location: R knee Pain Descriptors / Indicators: Sore;Tender;Operative site guarding Pain Intervention(s): Limited activity within patient's tolerance;Monitored during session;Ice applied;Repositioned    Home Living Family/patient expects to be discharged to:: Private residence Living  Arrangements: Spouse/significant other Available Help at Discharge: Family Type of Home: House Home Access: Stairs to enter Entrance Stairs-Rails: Right;Left Home Layout: One level Home Equipment: Environmental consultant - 2 wheels;Walker - 4 wheels;Shower seat - built in;Grab bars - toilet;Toilet riser (Toilet riser with bilateral arm rails)      Prior Function Level of Independence: Independent with assistive device(s)      Comments: Pt ambulates with B SPC's.  Pt reports h/o "passing out" especially after medical procedures. Was working and driving    PT Goals (current goals can now be found in the care plan section) Acute Rehab PT Goals Patient Stated Goal: to go home PT Goal Formulation: With patient Time For Goal Achievement: 02/23/16 Potential to Achieve Goals: Fair Progress towards PT goals: PT to reassess next treatment    Frequency  BID    PT Plan Current plan remains appropriate    Co-evaluation             End of Session Equipment Utilized During Treatment: Gait belt;Oxygen (2 L/min via nasal cannula) Activity Tolerance: Other (comment) (Limited functional mobility d/t not feeling well) Patient left: in bed;with call bell/phone within reach;with bed alarm set;with family/visitor present;with SCD's reapplied (B heels elevated via towel rolls; polar care in place and activated)     Time: [id number] PT Time Calculation (min) (ACUTE ONLY): 40 min  Charges:  $Therapeutic Exercise: 38-52 mins                    G CodesLeitha Bleak 2016/02/10, 4:03 PM [name], [place]

## 2016-02-10 LAB — BASIC METABOLIC PANEL
Anion gap: 5 (ref 5–15)
BUN: 8 mg/dL (ref 6–20)
CHLORIDE: 107 mmol/L (ref 101–111)
CO2: 25 mmol/L (ref 22–32)
CREATININE: 0.77 mg/dL (ref 0.61–1.24)
Calcium: 8.2 mg/dL — ABNORMAL LOW (ref 8.9–10.3)
GFR calc Af Amer: >60 mL/min (ref >60)
Glucose, Bld: 114 mg/dL — ABNORMAL HIGH (ref 65–99)
Potassium: 3.7 mmol/L (ref 3.5–5.1)
SODIUM: 137 mmol/L (ref 135–145)

## 2016-02-10 LAB — CBC
HCT: 26.1 % — ABNORMAL LOW (ref 40.0–52.0)
Hemoglobin: 8.8 g/dL — ABNORMAL LOW (ref 13.0–18.0)
MCH: 29.4 pg (ref 26.0–34.0)
MCHC: 33.5 g/dL (ref 32.0–36.0)
MCV: 87.8 fL (ref 80.0–100.0)
PLATELETS: 151 10*3/uL (ref 150–440)
RBC: 2.98 MIL/uL — ABNORMAL LOW (ref 4.40–5.90)
RDW: 13.9 % (ref 11.5–14.5)
WBC: 5.1 10*3/uL (ref 3.8–10.6)

## 2016-02-10 MED ORDER — TRAMADOL HCL 50 MG PO TABS: 50.0000 mg | ORAL_TABLET | ORAL | Status: DC | PRN

## 2016-02-10 MED ORDER — ENOXAPARIN SODIUM 40 MG/0.4ML ~~LOC~~ SOLN: 40.0000 mg | Freq: Two times a day (BID) | SUBCUTANEOUS | Status: DC

## 2016-02-10 MED ORDER — OXYCODONE HCL 5 MG PO TABS: 5.0000 mg | ORAL_TABLET | ORAL | Status: DC | PRN

## 2016-02-10 NOTE — Discharge Summary (Signed)
Physician Discharge Summary  Subjective: 2 Days Post-Op Procedure(s) (LRB): COMPUTER ASSISTED TOTAL KNEE ARTHROPLASTY (Right) Patient reports pain as mild.   Patient seen in rounds with Dr. Marry Guan. Patient is well, but has had some minor complaints of Hypotension and dizziness with standing Patient is ready to go to rehabilitation for physical therapy and blood pressure control.  Physician Discharge Summary  Patient ID: Daniel Austin MRN: [id number] DOB/AGE: 1941-07-02 75 y.o.  Admit date: 02/08/2016 Discharge date: 02/11/2016.  Admission Diagnoses: Right knee osteoarthritis. Discharge Diagnoses:  Active Problems:   S/P total knee arthroplasty   Discharged Condition: fair  Hospital Course: The patient is postop day 3 from a right total knee replacement. The patient has been treated for hypertension postoperatively. He did have a hemoglobin level that dropped to around 8.8 on postop day 2. The patient was managed by internal medicine as they held his blood pressure medication. The patient did receive IV Tylenol only, to limit his blood pressure lowering with any other pain medication. The patient did some physical therapy in the bed and was slow for ambulation because of lightheadedness and fatigue. The patient did have a syncopal episode on postop day 1 with physical therapy.  Treatments: surgery:  PROCEDURE: Right total knee arthroplasty using computer-assisted navigation  SURGEON: Marciano Sequin. M.D.  ASSISTANT: Vance Peper, PA (present and scrubbed throughout the case, critical for assistance with exposure, retraction, instrumentation, and closure)  ANESTHESIA: spinal  ESTIMATED BLOOD LOSS: 200 mL  FLUIDS REPLACED: 1700 mL of crystalloid  TOURNIQUET TIME: 100 minutes  DRAINS: 2 medium drains to a reinfusion system  SOFT TISSUE RELEASES: Anterior cruciate ligament, posterior cruciate ligament, deep and superficial medial collateral ligament, patellofemoral  ligament   IMPLANTS UTILIZED: DePuy Attune size 8 posterior stabilized femoral component (cemented), size 8 rotating platform tibial component (cemented), 41 mm medialized dome patella (cemented), and a 41 mm stabilized rotating platform polyethylene insert.  Discharge Exam: Blood pressure 128/69, pulse 94, temperature 98.4 F (36.9 C), temperature source Oral, resp. rate 19, height 5\' 10"  (1.778 m), weight 90.719 kg (200 lb), SpO2 99 %.   Disposition: 01-Home or Self Care     Medication List    TAKE these medications        acetaminophen 500 MG tablet  Commonly known as:  TYLENOL  Take 500 mg by mouth every 6 (six) hours as needed.     Aspirin 81 MG EC tablet  Take 81 mg by mouth daily.     citalopram 20 MG tablet  Commonly known as:  CELEXA  TAKE 1 TABLET (20 MG TOTAL) BY MOUTH DAILY.     clopidogrel 75 MG tablet  Commonly known as:  PLAVIX  TAKE 1 TABLET BY MOUTH DAILY     CRESTOR 20 MG tablet  Generic drug:  rosuvastatin  TAKE 1 TABLET (20 MG TOTAL) BY MOUTH NIGHTLY.     enoxaparin 40 MG/0.4ML injection  Commonly known as:  LOVENOX  Inject 0.4 mLs (40 mg total) into the skin every 12 (twelve) hours.     fexofenadine 180 MG tablet  Commonly known as:  ALLEGRA  Take 180 mg by mouth daily. As needed     levothyroxine 112 MCG tablet  Commonly known as:  SYNTHROID, LEVOTHROID  TAKE 1 TABLET (112 MCG TOTAL) BY MOUTH DAILY.     lisinopril 40 MG tablet  Commonly known as:  PRINIVIL,ZESTRIL  TAKE 1 TABLET (40 MG TOTAL) BY MOUTH DAILY.     LORazepam 1  MG tablet  Commonly known as:  ATIVAN  TAKE 1 TABLET BY MOUTH AT BEDTIME AS NEEDED FOR ANXIETY     metoprolol tartrate 25 MG tablet  Commonly known as:  LOPRESSOR  TAKE 1 TABLET BY MOUTH TWICE A DAY     naproxen sodium 220 MG tablet  Commonly known as:  ANAPROX  Take 220 mg by mouth as needed (knee pain).     nitroGLYCERIN 0.4 MG SL tablet  Commonly known as:  NITROSTAT  Place 0.4 mg under the tongue every 5  (five) minutes as needed. If a third tablet is needed please call 911.     oxyCODONE 5 MG immediate release tablet  Commonly known as:  Oxy IR/ROXICODONE  Take 1-2 tablets (5-10 mg total) by mouth every 4 (four) hours as needed for severe pain or breakthrough pain (If the patient is hypotensive, hold any oxycodone.).     pantoprazole 40 MG tablet  Commonly known as:  PROTONIX  TAKE 1 TABLET (40 MG TOTAL) BY MOUTH DAILY.     traMADol 50 MG tablet  Commonly known as:  ULTRAM  Take 1-2 tablets (50-100 mg total) by mouth every 4 (four) hours as needed for moderate pain.           Follow-up Information    Follow up with [facility] R., PA On 02/23/2016.   Specialty:  Physician Assistant   Why:  at 9:15am   Contact information:   [address] [facility] [place] [address] [phone]       Follow up with Dereck Leep, MD On 03/22/2016.   Specialty:  Orthopedic Surgery   Why:  at 9:45am   Contact information:   [place] [address] [phone]       Signed: Prescott Parma, Bradley Bostelman 02/10/2016, 6:35 AM   Objective: Vital signs in last 24 hours: Temp:  [97.7 F (36.5 C)-99.5 F (37.5 C)] 98.4 F (36.9 C) (05/19 0400) Pulse Rate:  [83-110] 94 (05/19 0400) Resp:  [15-20] 19 (05/19 0400) BP: (97-149)/(47-80) 128/69 mmHg (05/19 0400) SpO2:  [96 %-100 %] 99 % (05/19 0400)  Intake/Output from previous day:  Intake/Output Summary (Last 24 hours) at 02/10/16 0635 Last data filed at 02/10/16 0455  Gross per 24 hour  Intake 2057.33 ml  Output   1620 ml  Net 437.33 ml    Intake/Output this shift: Total I/O In: 1369 [P.O.:120; I.V.:1249] Out: 510 [Urine:400; Drains:110]  Labs:  Recent Labs  02/08/16 1017 02/09/16 0348 02/10/16 0448  HGB 13.6 9.6* 8.8*    Recent Labs  02/09/16 0348 02/10/16 0448  WBC 5.4 5.1  RBC 3.27* 2.98*  HCT 28.1* 26.1*  PLT 168 151    Recent Labs  02/09/16 0348  02/10/16 0448  NA 132* 137  K 4.1 3.7  CL 102 107  CO2 25 25  BUN 10 8  CREATININE 0.88 0.77  GLUCOSE 143* 114*  CALCIUM 8.1* 8.2*   No results for input(s): LABPT, INR in the last 72 hours.  EXAM: General - Patient is Alert and Oriented Extremity - Neurovascular intact Sensation intact distally No cellulitis present Compartment soft Incision - clean, dry, no drainage. The surgical honeycomb dressing was saturated but drying. With application of a new honeycomb dressing there was no active bleeding or drainage. Motor Function -  the patient could do a straight leg raise independently. He can plantarflex and dorsiflex his foot.  Assessment/Plan: 2 Days Post-Op Procedure(s) (LRB): COMPUTER ASSISTED  TOTAL KNEE ARTHROPLASTY (Right) Procedure(s) (LRB): COMPUTER ASSISTED TOTAL KNEE ARTHROPLASTY (Right) Past Medical History  Diagnosis Date  . Hypercholesterolemia   . Hypothyroidism   . Hypertension   . Fractures, multiple     Metal rod in left foot  . CAD (coronary artery disease)   . Carotid artery stenosis     high-grade symptomatic right sided s/p stent twice  . Syncope     second degree heart block s/p St Jude's pacemaker placement 12/08 with his history is limited with any  . Anemia   . Ulcer of gastric fundus   . Hyperglycemia   . Heart block   . Shortness of breath dyspnea   . GERD (gastroesophageal reflux disease)   . Arthritis   . Knee pain   . Presence of permanent cardiac pacemaker   . Depression   . CHF (congestive heart failure) (HCC)     chronic movement fluid and history of CHF he noticed in the   . Cardiomyopathy ([place])   . Stroke [facility])     clot in neck broke off left side weakness 2008  resolved   Active Problems:   S/P total knee arthroplasty  Estimated body mass index is 28.7 kg/(m^2) as calculated from the following:   Height as of this encounter: 5\' 10"  (1.778 m).   Weight as of this encounter: 90.719 kg (200 lb). Plan for discharge  tomorrow Discharge to SNF Diet - Regular diet Follow up - in 2 weeks Activity - WBAT Disposition - Rehab Condition Upon Discharge - Stable DVT Prophylaxis - Lovenox, TED hose and Plavix  Reche Dixon, PA-C Orthopaedic Surgery 02/10/2016, 6:35 AM

## 2016-02-10 NOTE — Discharge Instructions (Signed)

## 2016-02-10 NOTE — Plan of Care (Signed)
Problem: Activity: Goal: Risk for activity intolerance will decrease Outcome: Progressing Progressing but slowly.

## 2016-02-10 NOTE — Consult Note (Signed)
   [facility] CM Inpatient Consult   02/10/2016  Daniel Austin [birth year] [id number]   Patient screened for potential [facility] Management services. Patient is eligible for [place]. Electronic medical record reveals patient's discharge plan is SNF. [facility] Care Management services not appropriate at this time. If patient's post hospital needs change please place a [facility] Care Management consult. For questions please contact:   Delrose Rohwer RN, [facility] Liaison  [phone]) Business Mobile [phone]) Toll free office

## 2016-02-10 NOTE — Progress Notes (Signed)
PT continues to recommend SNF today. Clinical Social Worker (CSW) met with patient and his wife to follow up on SNF choice. Patient and wife chose [facility].   Plan is for patient to D/C to [facility] tomorrow 02/11/16 pending medical clearance. Per Bill admissions coordinator at [facility] patient will go to room E-9. CSW sent D/C Summary, FL2 and D/C Packet to [employer] via [place]. CSW also faxed H&P and prescriptions to [place]. CSW will continue to follow and assist as needed.   Blima Rich, LCSW [phone]

## 2016-02-10 NOTE — Progress Notes (Signed)
Physical Therapy Treatment Patient Details Name: Daniel Austin MRN: [id number] DOB: January 22, 1941 Today's Date: 02/10/2016    History of Present Illness Pt is a 76 y.o. male s/p elective R TKA secondary to OA 02/08/16.  Pt with low BP post-op and rapid response called (pt was dizzy and went unresponsive) 02/08/16 PM.  PMH includes SOB with exertion, CVA, L sided weakness (from 2008), metal rod L foot/ankle, CAD, pacemaker, heart block.    PT Comments    Pt con't to demonstrate orthostatic episodes.  MinA bed mobility with assist for RLE positioning.  BP 119/64 sitting at EOB, upon standing pt able to take several small steps to recliner chair.  Then c/o increased dizziness BP upon sitting 71/40 which resolved to 119/60 within 3 minutes.  Pt then able to tolerate seated LE therex with no further complaints.  He would con't to benefit from skilled PT to improve LE strength and functional mobility tolerance.   Follow Up Recommendations        Equipment Recommendations       Recommendations for Other Services       Precautions / Restrictions Precautions Precautions: Knee;Fall Restrictions Weight Bearing Restrictions: Yes RLE Weight Bearing: Weight bearing as tolerated    Mobility  Bed Mobility Overal bed mobility: Needs Assistance Bed Mobility: Supine to Sit     Supine to sit: HOB elevated     General bed mobility comments: Min cues for sequencing   Transfers Overall transfer level: Needs assistance Equipment used: Rolling walker (2 wheeled)             General transfer comment: Cues for PHP, pt demonstrated good safety with transfer  Ambulation/Gait Ambulation/Gait assistance: Min assist Ambulation Distance (Feet): 3 Feet Assistive device: Rolling walker (2 wheeled) Gait Pattern/deviations: Step-to pattern         Stairs            Wheelchair Mobility    Modified Rankin (Stroke Patients Only)       Balance Overall balance assessment: Needs  assistance Sitting-balance support: Bilateral upper extremity supported Sitting balance-Leahy Scale: Good     Standing balance support: Bilateral upper extremity supported Standing balance-Leahy Scale: Fair Standing balance comment: Cues for erect postuer                    Cognition Arousal/Alertness: Awake/alert Behavior During Therapy: WFL for tasks assessed/performed Overall Cognitive Status: Within Functional Limits for tasks assessed                      Exercises Total Joint Exercises Ankle Circles/Pumps: AROM;Both;15 reps Heel Slides: AROM;Strengthening;Right;10 reps;Seated Hip ABduction/ADduction: AROM;Strengthening;Right;10 reps;Seated Straight Leg Raises: AROM;Strengthening;Right;10 reps;Seated    General Comments        Pertinent Vitals/Pain Pain Assessment: 0-10 Pain Score: 1  Pain Location: R knee Pain Descriptors / Indicators: Aching Pain Intervention(s): Limited activity within patient's tolerance;Monitored during session    Home Living                      Prior Function            PT Goals (current goals can now be found in the care plan section) Acute Rehab PT Goals Patient Stated Goal: to go home PT Goal Formulation: With patient Time For Goal Achievement: 02/23/16 Potential to Achieve Goals: Fair Progress towards PT goals: Progressing toward goals    Frequency  BID    PT Plan Current plan remains appropriate  Co-evaluation             End of Session Equipment Utilized During Treatment: Gait belt Activity Tolerance: No increased pain;Other (comment) (Pt con't to have orthosatic episodes ) Patient left: in chair;with call bell/phone within reach;with chair alarm set;with family/visitor present     Time: [id number] PT Time Calculation (min) (ACUTE ONLY): 28 min  Charges:  $Therapeutic Exercise: 8-22 mins $Therapeutic Activity: 8-22 mins                    G Codes:      Ray Gervasi 2016-02-15,  12:05 PM  Toba Claudio, PTA

## 2016-02-10 NOTE — Progress Notes (Signed)
[facility] [place] NAME: Nesanel Nykaza    MR#:  [id number]  DATE OF BIRTH:  April 23, 1941  SUBJECTIVE:  CHIEF COMPLAINT:  No chief complaint on file. working with PT, has significant orthostasis. REVIEW OF SYSTEMS:  Review of Systems  Constitutional: Negative for fever, weight loss, malaise/fatigue and diaphoresis.  HENT: Negative for ear discharge, ear pain, hearing loss, nosebleeds, sore throat and tinnitus.   Eyes: Negative for blurred vision and pain.  Respiratory: Negative for cough, hemoptysis, shortness of breath and wheezing.   Cardiovascular: Negative for chest pain, palpitations, orthopnea and leg swelling.  Gastrointestinal: Negative for heartburn, nausea, vomiting, abdominal pain, diarrhea, constipation and blood in stool.  Genitourinary: Negative for dysuria, urgency and frequency.  Musculoskeletal: Negative for myalgias and back pain.  Skin: Negative for itching and rash.  Neurological: Negative for dizziness, tingling, tremors, focal weakness, seizures, weakness and headaches.  Psychiatric/Behavioral: Negative for depression. The patient is not nervous/anxious.    DRUG ALLERGIES:   Allergies  Allergen Reactions  . Beta Adrenergic Blockers Other (See Comments)    Second Degree Heart Block  . Lipitor [Atorvastatin] Other (See Comments)    Myalgia Other Reaction: myalgia Myalgia    VITALS:  Blood pressure 130/60, pulse 108, temperature 98.6 F (37 C), temperature source Oral, resp. rate 16, height 5\' 10"  (1.778 m), weight 90.719 kg (200 lb), SpO2 96 %. PHYSICAL EXAMINATION:  Physical Exam  Constitutional: He is oriented to person, place, and time and well-developed, well-nourished, and in no distress.  HENT:  Head: Normocephalic and atraumatic.  Eyes: Conjunctivae and EOM are normal. Pupils are equal, round, and reactive to light.  Neck: Normal range of motion. Neck supple. No tracheal deviation present. No  thyromegaly present.  Cardiovascular: Normal rate, regular rhythm and normal heart sounds.   Pulmonary/Chest: Effort normal and breath sounds normal. No respiratory distress. He has no wheezes. He exhibits no tenderness.  Abdominal: Soft. Bowel sounds are normal. He exhibits no distension. There is no tenderness.  Musculoskeletal: Normal range of motion.  Neurological: He is alert and oriented to person, place, and time. No cranial nerve deficit.  Skin: Skin is warm and dry. No rash noted.     Over Rt Knee - dressing clean, dry, no drainage,  Hemovac intact.  Psychiatric: Mood and affect normal.   LABORATORY PANEL:   CBC  Recent Labs Lab 02/10/16 0448  WBC 5.1  HGB 8.8*  HCT 26.1*  PLT 151   ------------------------------------------------------------------------------------------------------------------ Chemistries   Recent Labs Lab 02/10/16 0448  NA 137  K 3.7  CL 107  CO2 25  GLUCOSE 114*  BUN 8  CREATININE 0.77  CALCIUM 8.2*   RADIOLOGY:  No results found. ASSESSMENT AND PLAN:  Zam Ent is a 75 y.o. male with a known history of DJD, coronary artery disease, anemia, hypertension, hyperlipidemia, was admitted on the order service for elective right total knee arthroplasty. Patient underwent surgery under spinal anesthesia.  1. Orthostatic hypotension - stopped metoprolol, lisinopril and nitroglycerin - can be due to SSRIs (on celexa) - he follows with Dr Saralyn Pilar - further w/up as an outpt   2. History of CAD - continue cardiac meds after blood pressure stable  3. Hypothyroidism on Synthroid  4. Hyperlipidemia on Crestor  5. DVT prophylaxis per ortho     All the records are reviewed and case discussed with Care Management/Social Worker. Management plans discussed with the patient, family and they are in agreement.  CODE STATUS: Full code  TOTAL TIME TAKING CARE OF THIS PATIENT: 25 minutes.   More than 50% of the time was spent in  counseling/coordination of care: YES  D/C per primary team - likely tomorrow   [facility], Atha Starks M.D on 02/10/2016 at 4:38 PM  Between 7am to 6pm - Pager - [phone]  After 6pm go to [email or web address] - password [email or web address] [facility]  Office  [phone]  CC: Primary care physician; Einar Pheasant, MD  Note: This dictation was prepared with Dragon dictation along with smaller phrase technology. Any transcriptional errors that result from this process are unintentional.

## 2016-02-10 NOTE — Progress Notes (Signed)
Subjective: 2 Days Post-Op Procedure(s) (LRB): COMPUTER ASSISTED TOTAL KNEE ARTHROPLASTY (Right) Patient reports pain as moderate.   Patient seen in rounds with Dr. Marry Guan. Patient is well, and has had no acute complaints or problems. The patient is being treated for postoperative hypotension. Medicine is following. The patient does not experience any lightheadedness while in bed. The patient did become faint with standing on postop day 1 and became hypotensive with sitting at the bed on postop day 2. Plan is to go to rehabilitation after hospital stay. The plan is to go to rehabilitation on Saturday. Negative for chest pain and shortness of breath Fever: no Gastrointestinal: Negative for nausea and vomiting  Objective: Vital signs in last 24 hours: Temp:  [97.7 F (36.5 C)-99.5 F (37.5 C)] 98.4 F (36.9 C) (05/19 0400) Pulse Rate:  [83-110] 94 (05/19 0400) Resp:  [15-20] 19 (05/19 0400) BP: (97-149)/(47-80) 128/69 mmHg (05/19 0400) SpO2:  [96 %-100 %] 99 % (05/19 0400)  Intake/Output from previous day:  Intake/Output Summary (Last 24 hours) at 02/10/16 0628 Last data filed at 02/10/16 0455  Gross per 24 hour  Intake 2057.33 ml  Output   1620 ml  Net 437.33 ml    Intake/Output this shift: Total I/O In: 1369 [P.O.:120; I.V.:1249] Out: 510 [Urine:400; Drains:110]  Labs:  Recent Labs  02/08/16 1017 02/09/16 0348 02/10/16 0448  HGB 13.6 9.6* 8.8*    Recent Labs  02/09/16 0348 02/10/16 0448  WBC 5.4 5.1  RBC 3.27* 2.98*  HCT 28.1* 26.1*  PLT 168 151    Recent Labs  02/09/16 0348 02/10/16 0448  NA 132* 137  K 4.1 3.7  CL 102 107  CO2 25 25  BUN 10 8  CREATININE 0.88 0.77  GLUCOSE 143* 114*  CALCIUM 8.1* 8.2*   No results for input(s): LABPT, INR in the last 72 hours.   EXAM General - Patient is Alert and Oriented Extremity - Sensation intact distally Dorsiflexion/Plantar flexion intact Compartment soft Dressing/Incision - The surgical bandage  was removed. The honeycomb dressing was saturated with blood, but dry. A new honeycomb dressing was applied. There was no active bleeding. The Hemovac was removed with no complication and minimal drainage. Motor Function - intact, moving foot and toes well on exam. The patient is able to do a straight leg raise independently. He was unable to ambulate with physical therapy based on hypotension and lightheadedness.  Past Medical History  Diagnosis Date  . Hypercholesterolemia   . Hypothyroidism   . Hypertension   . Fractures, multiple     Metal rod in left foot  . CAD (coronary artery disease)   . Carotid artery stenosis     high-grade symptomatic right sided s/p stent twice  . Syncope     second degree heart block s/p [employer]'s pacemaker placement 12/08 with his history is limited with any  . Anemia   . Ulcer of gastric fundus   . Hyperglycemia   . Heart block   . Shortness of breath dyspnea   . GERD (gastroesophageal reflux disease)   . Arthritis   . Knee pain   . Presence of permanent cardiac pacemaker   . Depression   . CHF (congestive heart failure) (HCC)     chronic movement fluid and history of CHF he noticed in the   . Cardiomyopathy ([place])   . Stroke [facility])     clot in neck broke off left side weakness 2008  resolved    Assessment/Plan: 2 Days Post-Op  Procedure(s) (LRB): COMPUTER ASSISTED TOTAL KNEE ARTHROPLASTY (Right) Active Problems:   S/P total knee arthroplasty  Estimated body mass index is 28.7 kg/(m^2) as calculated from the following:   Height as of this encounter: 5\' 10"  (1.778 m).   Weight as of this encounter: 90.719 kg (200 lb). Plan to discharge on Saturday to rehabilitation.  Bowel management. Up with physical therapy as his blood pressure allows. Presently only receiving POTylenol for pain control and no blood pressure medication because of his hypertension.  DVT Prophylaxis - Lovenox, Foot Pumps and TED hose Weight-Bearing as tolerated to right  leg  Reche Dixon, PA-C Orthopaedic Surgery 02/10/2016, 6:28 AM

## 2016-02-10 NOTE — Progress Notes (Signed)
Patient cooeprated with physical therapy this shift.  Was able to ambulate out into hall to the nurses station but heart rate increased to 120s and oxygen levels dropped, upon sitting in chair in hallway patient oxygen levels recovered quickly.  Patient plans to discharge to [facility] at time of discharge.

## 2016-02-10 NOTE — Progress Notes (Signed)
Physical Therapy Treatment Patient Details Name: Daniel Austin MRN: [id number] DOB: 29-Sep-1940 Today's Date: 02/10/2016    History of Present Illness Pt is a 75 y.o. male s/p elective R TKA secondary to OA 02/08/16.  Pt with low BP post-op and rapid response called (pt was dizzy and went unresponsive) 02/08/16 PM.  PMH includes SOB with exertion, CVA, L sided weakness (from 2008), metal rod L foot/ankle, CAD, pacemaker, heart block.    PT Comments    Pt with improved tolerance to activities this pm.  Seated BP 120/60, standing one minute 94/40 however no c/o increased dizziness.  Improved sit to stand transfers with good demonstration of safety and technique.  Pt able to ambulate approx 21ft x 1 with RW BP after gait sitting in chair 133/63.  Reviewed HEP packed and performed therex in handout x 10.  Will continue to benefit from skilled PT for strengthening, ROM, and functional activity as tolerated.   Follow Up Recommendations        Equipment Recommendations       Recommendations for Other Services       Precautions / Restrictions Precautions Precautions: Knee;Fall Restrictions Weight Bearing Restrictions: Yes RLE Weight Bearing: Weight bearing as tolerated    Mobility  Bed Mobility                  Transfers Overall transfer level: Needs assistance Equipment used: Rolling walker (2 wheeled)             General transfer comment: Improved technique with transfer  Ambulation/Gait Ambulation/Gait assistance: Min guard Ambulation Distance (Feet): 25 Feet Assistive device: Rolling walker (2 wheeled) Gait Pattern/deviations: Step-to pattern;Step-through pattern;Decreased step length - left;Decreased stance time - right         Stairs            Wheelchair Mobility    Modified Rankin (Stroke Patients Only)       Balance Overall balance assessment: Needs assistance Sitting-balance support: Bilateral upper extremity supported Sitting  balance-Leahy Scale: Good     Standing balance support: Bilateral upper extremity supported Standing balance-Leahy Scale: Fair                      Cognition Arousal/Alertness: Awake/alert Behavior During Therapy: WFL for tasks assessed/performed Overall Cognitive Status: Within Functional Limits for tasks assessed                      Exercises Total Joint Exercises Ankle Circles/Pumps: AROM;Both;20 reps;Seated Quad Sets: AROM;Strengthening;Right;10 reps;Seated Heel Slides: AROM;Strengthening;Right;10 reps;Seated Hip ABduction/ADduction: AROM;Strengthening;Right;10 reps;Seated Straight Leg Raises: AROM;Strengthening;Right;10 reps;Seated Goniometric ROM: 80 active 90 passive    General Comments        Pertinent Vitals/Pain Pain Assessment: 0-10 Pain Score: 2  Pain Location: R Knee (R knee) Pain Descriptors / Indicators: Aching Pain Intervention(s): Monitored during session    Home Living                      Prior Function            PT Goals (current goals can now be found in the care plan section) Acute Rehab PT Goals Patient Stated Goal: to go home PT Goal Formulation: With patient Time For Goal Achievement: 02/23/16 Potential to Achieve Goals: Fair Progress towards PT goals: Progressing toward goals    Frequency  BID    PT Plan Current plan remains appropriate    Co-evaluation  End of Session Equipment Utilized During Treatment: Gait belt Activity Tolerance: Patient tolerated treatment well Patient left: in chair;with call bell/phone within reach;with chair alarm set;with family/visitor present     Time: 1340-1410 PT Time Calculation (min) (ACUTE ONLY): 30 min  Charges:  $Gait Training: 8-22 mins $Therapeutic Exercise: 8-22 mins                    G Codes:      Zigmond Trela 2016/02/25, 4:15 PM  Danalee Flath, PTA

## 2016-02-11 LAB — CBC
HCT: 24.5 % — ABNORMAL LOW (ref 40.0–52.0)
Hemoglobin: 8.1 g/dL — ABNORMAL LOW (ref 13.0–18.0)
MCH: 28.7 pg (ref 26.0–34.0)
MCHC: 33.1 g/dL (ref 32.0–36.0)
MCV: 86.6 fL (ref 80.0–100.0)
PLATELETS: 150 10*3/uL (ref 150–440)
RBC: 2.83 MIL/uL — ABNORMAL LOW (ref 4.40–5.90)
RDW: 13.7 % (ref 11.5–14.5)
WBC: 5.4 10*3/uL (ref 3.8–10.6)

## 2016-02-11 MED ORDER — ENOXAPARIN SODIUM 40 MG/0.4ML ~~LOC~~ SOLN
40.0000 mg | SUBCUTANEOUS | Status: DC
  Administered 2016-02-12 – 2016-02-14 (×3): 40 mg via SUBCUTANEOUS
  Filled 2016-02-11 (×3): qty 0.4

## 2016-02-11 MED ORDER — LACTULOSE 10 GM/15ML PO SOLN
10.0000 g | Freq: Two times a day (BID) | ORAL | Status: DC | PRN
  Administered 2016-02-11 – 2016-02-14 (×4): 10 g via ORAL
  Filled 2016-02-11 (×4): qty 30

## 2016-02-11 NOTE — Care Management Note (Signed)
Case Management Note  Patient Details  Name: Daniel Austin MRN: [id number] Date of Birth: 1940-10-13  Subjective/Objective:        Dr Rudene Christians evaluated Daniel Austin for possible discharge to SNF today but reports no discharge today per excessive wound drainage. CSW updated.             Action/Plan:   Expected Discharge Date:                  Expected Discharge Plan:     In-House Referral:     Discharge planning Services     Post Acute Care Choice:    Choice offered to:     DME Arranged:    DME Agency:     HH Arranged:    [place] Agency:     Status of Service:     Medicare Important Message Given:  Yes Date Medicare IM Given:    Medicare IM give by:    Date Additional Medicare IM Given:    Additional Medicare Important Message give by:     If discussed at [place] of Stay Meetings, dates discussed:    Additional Comments:  Venie Montesinos A, RN 02/11/2016, 11:56 AM

## 2016-02-11 NOTE — Progress Notes (Signed)
Physical Therapy Treatment Patient Details Name: Daniel Austin MRN: [id number] DOB: 23-Dec-1940 Today's Date: 02/11/2016    History of Present Illness Pt is a 75 y.o. male s/p elective R TKA secondary to OA 02/08/16.  Pt with low BP post-op and rapid response called (pt was dizzy and went unresponsive) 02/08/16 PM.  PMH includes SOB with exertion, CVA, L sided weakness (from 2008), metal rod L foot/ankle, CAD, pacemaker, heart block.    PT Comments    Pt's BP decreased (and HR increased) from 111/66 supine (HR 103 bpm), to 110/70 sitting (HR 116 bpm), to 91/55 standing (HR 124 bpm).  Pt took steps to recliner with RW and after sitting down in chair pt's BP noted to be 82/52 with HR 102 bpm.  After about 3 minutes of rest in chair pt's BP increased back to 125/66.  Nursing notified regarding all of these vitals.  Deferred ambulation d/t decreased BP with standing activities.  Will attempt to progress pt with functional mobility next session as able/appropriate.   Follow Up Recommendations  SNF     Equipment Recommendations       Recommendations for Other Services       Precautions / Restrictions Precautions Precautions: Knee;Fall Precaution Booklet Issued: Yes (comment) Precaution Comments: h/o syncope post surgery/procedure Restrictions Weight Bearing Restrictions: Yes RLE Weight Bearing: Weight bearing as tolerated    Mobility  Bed Mobility Overal bed mobility: Needs Assistance Bed Mobility: Supine to Sit     Supine to sit: Min assist;HOB elevated     General bed mobility comments: assist for R LE  Transfers Overall transfer level: Needs assistance Equipment used: Rolling walker (2 wheeled) Transfers: Sit to/from Stand Sit to Stand: Min assist         General transfer comment: increased effort and time to stand with assist  Ambulation/Gait Ambulation/Gait assistance: Min guard Ambulation Distance (Feet): 5 Feet (bed to chair) Assistive device: Rolling walker  (2 wheeled) Gait Pattern/deviations: Antalgic;Step-to pattern;Decreased step length - left;Decreased stance time - right Gait velocity: decreased       Stairs            Wheelchair Mobility    Modified Rankin (Stroke Patients Only)       Balance Overall balance assessment: Needs assistance Sitting-balance support: Bilateral upper extremity supported;Feet supported Sitting balance-Leahy Scale: Good     Standing balance support: Bilateral upper extremity supported (on RW) Standing balance-Leahy Scale: Fair                      Cognition Arousal/Alertness: Awake/alert Behavior During Therapy: WFL for tasks assessed/performed Overall Cognitive Status: Within Functional Limits for tasks assessed                      Exercises Total Joint Exercises Ankle Circles/Pumps: AROM;Strengthening;Both;10 reps;Seated Short Arc Quad: AROM;Strengthening;Both;10 reps;Seated Knee Flexion: AROM;Strengthening;Both;10 reps;Seated (Limited range R knee flexion)  B hip marching x10 reps AROM.    General Comments General comments (skin integrity, edema, etc.): R knee ace bandage noted to have small amount of red drainage anterior knee (nursing notified)  Pt agreeable to PT session.  Pt's wife present during session.      Pertinent Vitals/Pain Pain Assessment: 0-10 Pain Score: 4  Pain Location: R knee Pain Descriptors / Indicators: Aching;Sore;Tender;Operative site guarding Pain Intervention(s): Limited activity within patient's tolerance;Monitored during session;Premedicated before session;Repositioned;Ice applied    Home Living  Prior Function            PT Goals (current goals can now be found in the care plan section) Acute Rehab PT Goals Patient Stated Goal: to go home PT Goal Formulation: With patient Time For Goal Achievement: 02/23/16 Potential to Achieve Goals: Fair Progress towards PT goals: Progressing toward goals     Frequency  BID    PT Plan Current plan remains appropriate    Co-evaluation             End of Session Equipment Utilized During Treatment: Gait belt Activity Tolerance: Patient tolerated treatment well Patient left: in chair;with call bell/phone within reach;with chair alarm set;with family/visitor present;with SCD's reapplied (B heels elevated via towel rolls; polar care in place and activated)     Time: [id number] PT Time Calculation (min) (ACUTE ONLY): 29 min  Charges:  $Therapeutic Exercise: 8-22 mins $Therapeutic Activity: 8-22 mins                    G CodesLeitha Bleak 03-Mar-2016, 4:24 PM [name], [place]

## 2016-02-11 NOTE — Progress Notes (Signed)
Patient noted with drainage to right knee, paged MD, awaiting response.

## 2016-02-11 NOTE — Progress Notes (Addendum)
Subjective: 3 Days Post-Op Procedure(s) (LRB): COMPUTER ASSISTED TOTAL KNEE ARTHROPLASTY (Right) Patient reports pain as mild.   Patient is well, and has had no acute complaints or problems. The patient is being treated for postoperative hypotension. Medicine is following. The patient does not experience any lightheadedness while in bed. The patient did become faint with standing on postop day 1 and became hypotensive with sitting at the bed on postop day 2. Plan is to go to rehabilitation after hospital stay. The plan is to go to rehabilitation on Saturday. Negative for chest pain and shortness of breath Fever: no Gastrointestinal: Negative for nausea and vomiting  Objective: Vital signs in last 24 hours: Temp:  [98.2 F (36.8 C)-98.6 F (37 C)] 98.5 F (36.9 C) (05/20 0814) Pulse Rate:  [89-117] 90 (05/20 0814) Resp:  [16-20] 18 (05/20 0814) BP: (71-139)/(40-66) 139/66 mmHg (05/20 0814) SpO2:  [94 %-98 %] 95 % (05/20 0814)  Intake/Output from previous day:  Intake/Output Summary (Last 24 hours) at 02/11/16 0827 Last data filed at 02/11/16 0730  Gross per 24 hour  Intake    360 ml  Output    950 ml  Net   -590 ml    Intake/Output this shift:    Labs:  Recent Labs  02/08/16 1017 02/09/16 0348 02/10/16 0448 02/11/16 0352  HGB 13.6 9.6* 8.8* 8.1*    Recent Labs  02/10/16 0448 02/11/16 0352  WBC 5.1 5.4  RBC 2.98* 2.83*  HCT 26.1* 24.5*  PLT 151 150    Recent Labs  02/09/16 0348 02/10/16 0448  NA 132* 137  K 4.1 3.7  CL 102 107  CO2 25 25  BUN 10 8  CREATININE 0.88 0.77  GLUCOSE 143* 114*  CALCIUM 8.1* 8.2*   No results for input(s): LABPT, INR in the last 72 hours.   EXAM General - Patient is Alert and Oriented Extremity - Sensation intact distally Dorsiflexion/Plantar flexion intact Compartment soft Dressing/Incision - [place] was placed this AM, moderate bloody drainage already visualized to the right knee. Motor Function - intact,  moving foot and toes well on exam. The patient is able to do a straight leg raise independently. Pt ambulated 25 feet yesterday with PT.  Past Medical History  Diagnosis Date  . Hypercholesterolemia   . Hypothyroidism   . Hypertension   . Fractures, multiple     Metal rod in left foot  . CAD (coronary artery disease)   . Carotid artery stenosis     high-grade symptomatic right sided s/p stent twice  . Syncope     second degree heart block s/p St Jude's pacemaker placement 12/08 with his history is limited with any  . Anemia   . Ulcer of gastric fundus   . Hyperglycemia   . Heart block   . Shortness of breath dyspnea   . GERD (gastroesophageal reflux disease)   . Arthritis   . Knee pain   . Presence of permanent cardiac pacemaker   . Depression   . CHF (congestive heart failure) (HCC)     chronic movement fluid and history of CHF he noticed in the   . Cardiomyopathy ([place])   . Stroke [facility])     clot in neck broke off left side weakness 2008  resolved    Assessment/Plan: 3 Days Post-Op Procedure(s) (LRB): COMPUTER ASSISTED TOTAL KNEE ARTHROPLASTY (Right) Active Problems:   S/P total knee arthroplasty  Estimated body mass index is 28.7 kg/(m^2) as calculated from the following:   Height  as of this encounter: 5\' 10"  (1.778 m).   Weight as of this encounter: 90.719 kg (200 lb). Plan to discharge today following BM.  Lactulose added to medications, FLEET enema on board Up with physical therapy Medicine stated that cardiac meds can be restarted when BP stable.   HG 8.1 this AM, moderate bloody drainage continuing to the right knee.  Will hold lovenox this AM, will discuss possible discharge with Dr. Rudene Christians. Presently only receiving PO Tylenol for pain control and no blood pressure medication because of his hypertension.  DVT Prophylaxis - Lovenox, Foot Pumps and TED hose Weight-Bearing as tolerated to right leg  J. Cameron Proud, PA-C Orthopaedic Surgery 02/11/2016, 8:27 AM     Has excessive drainage since bandage applied this am, applied ABDs, ACE wrap to compress Reassess tomorrow am for probable discharge then.

## 2016-02-11 NOTE — Clinical Social Work Note (Signed)
Pt is not ready for discharge today, per MD. CSW notified facility. CSW will continue to follow to facilitate discharge when pt is stable.   Darden Dates, MSW, LCSW  Clinical Social Worker  [phone]

## 2016-02-11 NOTE — Progress Notes (Signed)
Physical Therapy Treatment Patient Details Name: Daniel Austin MRN: [id number] DOB: 06-Feb-1941 Today's Date: 02/11/2016    History of Present Illness Pt is a 75 y.o. male s/p elective R TKA secondary to OA 02/08/16.  Pt with low BP post-op and rapid response called (pt was dizzy and went unresponsive) 02/08/16 PM.  PMH includes SOB with exertion, CVA, L sided weakness (from 2008), metal rod L foot/ankle, CAD, pacemaker, heart block.    PT Comments    Pt's BP decreased from 123/57 supine to 109/58 sitting to 81/60 standing with RW (pt symptomatic) so pt assisted back supine and BP increased back to 122/60.  Deferred further OOB mobility d/t BP concerns.  Will attempt to progress pt with transfers and ambulation next session per pt tolerance.   Follow Up Recommendations  SNF     Equipment Recommendations       Recommendations for Other Services       Precautions / Restrictions Precautions Precautions: Knee;Fall Precaution Comments: h/o syncope post surgery/procedure Restrictions Weight Bearing Restrictions: Yes RLE Weight Bearing: Weight bearing as tolerated    Mobility  Bed Mobility Overal bed mobility: Needs Assistance Bed Mobility: Supine to Sit;Sit to Supine     Supine to sit: Min assist;HOB elevated Sit to supine: Min assist;HOB elevated   General bed mobility comments: assist for R LE  Transfers Overall transfer level: Needs assistance Equipment used: Rolling walker (2 wheeled) Transfers: Sit to/from Stand Sit to Stand: Min assist;Mod assist         General transfer comment: increased effort and time to stand with assist  Ambulation/Gait             General Gait Details: not appropriate at this time d/t low BP in standing   Stairs            Wheelchair Mobility    Modified Rankin (Stroke Patients Only)       Balance Overall balance assessment: Needs assistance Sitting-balance support: Bilateral upper extremity supported;Feet  supported Sitting balance-Leahy Scale: Good                              Cognition Arousal/Alertness: Awake/alert Behavior During Therapy: WFL for tasks assessed/performed Overall Cognitive Status: Within Functional Limits for tasks assessed                      Exercises Total Joint Exercises Ankle Circles/Pumps: AROM;Strengthening;Both;10 reps;Supine Quad Sets: AROM;Strengthening;Both;10 reps;Supine Hip ABduction/ADduction: AROM;Strengthening;Both;10 reps;Supine Straight Leg Raises: AROM;Strengthening;Both;10 reps;Supine Goniometric ROM: R knee ROM:  extension 15 degrees short of neutral semi-supine in bed; flexion94 degrees semi-supine in bed General Exercises - Lower Extremity Hip Flexion/Marching: AROM;Strengthening;Both;10 reps;Standing (UE support on RW)    General Comments General comments (skin integrity, edema, etc.): R knee dressing with moderate drainage noted  Pt agreeable to PT session.  Pt's wife present during session.      Pertinent Vitals/Pain Pain Assessment: 0-10 Pain Score: 4  Pain Location: R knee Pain Descriptors / Indicators: Aching;Sore;Tender;Operative site guarding Pain Intervention(s): Limited activity within patient's tolerance;Monitored during session;Premedicated before session;Repositioned;Ice applied    Home Living                      Prior Function            PT Goals (current goals can now be found in the care plan section) Acute Rehab PT Goals Patient Stated Goal: to go home  PT Goal Formulation: With patient Time For Goal Achievement: 02/23/16 Potential to Achieve Goals: Fair Progress towards PT goals: PT to reassess next treatment    Frequency  BID    PT Plan Current plan remains appropriate    Co-evaluation             End of Session Equipment Utilized During Treatment: Gait belt Activity Tolerance: Treatment limited secondary to medical complications (Comment) (Decreased BP with  standing) Patient left: in bed;with call bell/phone within reach;with bed alarm set;with family/visitor present;with SCD's reapplied (B heels elevated via towel rolls; polar care in place and activated)     Time: 1000-1038 PT Time Calculation (min) (ACUTE ONLY): 38 min  Charges:  $Therapeutic Exercise: 23-37 mins $Therapeutic Activity: 8-22 mins                    G CodesLeitha Bleak Mar 09, 2016, 12:12 PM [name], [place]

## 2016-02-11 NOTE — Progress Notes (Signed)
Occupational Therapy Treatment Patient Details Name: Daniel Austin MRN: [id number] DOB: 02-22-1941 Today's Date: 02/11/2016    History of present illness Pt is a 75 y.o. male s/p elective R TKA secondary to OA 02/08/16.  Pt with low BP post-op and rapid response called (pt was dizzy and went unresponsive) 02/08/16 PM.  PMH includes SOB with exertion, CVA, L sided weakness (from 2008), metal rod L foot/ankle, CAD, pacemaker, heart block.   OT comments  Patient was supine in bed when OT arrived. PT had just completed session a few minutes prior to OT arriving, stated that patient BP dropped with upright activity including sitting EOB. Patient stated he was feeling better now that he was lying back down. Focused on education and activities that could be adjusted for management of orthostatic BP at this time. Wife and daughter present at bedside. Daughter actively participating in education. Patient educated on use of AE for lower body dressing. Family and patient state patient will need L TKA soon after recovery from this operation, and think use of AE will be needed for a little while. Patient able to participate in portions of task from bed level with assist. Daughter also able to perform, per request to understand and demonstrate for patient.    Follow Up Recommendations  SNF    Equipment Recommendations       Recommendations for Other Services      Precautions / Restrictions Precautions Precautions: Knee;Fall Precaution Booklet Issued: Yes (comment) Precaution Comments: h/o syncope post surgery/procedure Restrictions Weight Bearing Restrictions: Yes RLE Weight Bearing: Weight bearing as tolerated       Mobility Bed Mobility Overal bed mobility: Needs Assistance Bed Mobility: Supine to Sit;Sit to Supine     Supine to sit: Min assist;HOB elevated Sit to supine: Min assist;HOB elevated   General bed mobility comments: assist for R LE  Transfers Overall transfer level: Needs  assistance Equipment used: Rolling walker (2 wheeled) Transfers: Sit to/from Stand Sit to Stand: Min assist;Mod assist         General transfer comment: increased effort and time to stand with assist    Balance Overall balance assessment: Needs assistance Sitting-balance support: Bilateral upper extremity supported;Feet supported Sitting balance-Leahy Scale: Good                             ADL Overall ADL's : Needs assistance/impaired                     Lower Body Dressing: Moderate assistance;Cueing for compensatory techniques Lower Body Dressing Details (indicate cue type and reason): Educated patient and daughter in use of AE for LB dressing.                      Vision                     Perception     Praxis      Cognition   Behavior During Therapy: [facility] for tasks assessed/performed Overall Cognitive Status: Within Functional Limits for tasks assessed                       Extremity/Trunk Assessment               Exercises Total Joint Exercises Ankle Circles/Pumps: AROM;Strengthening;Both;10 reps;Supine Quad Sets: AROM;Strengthening;Both;10 reps;Supine Hip ABduction/ADduction: AROM;Strengthening;Both;10 reps;Supine Straight Leg Raises: AROM;Strengthening;Both;10 reps;Supine Goniometric ROM: R knee ROM:  extension 15 degrees short of neutral semi-supine in bed; flexion94 degrees semi-supine in bed General Exercises - Lower Extremity Hip Flexion/Marching: AROM;Strengthening;Both;10 reps;Standing (UE support on RW)   Shoulder Instructions       General Comments      Pertinent Vitals/ Pain       Pain Assessment: 0-10 Pain Score: 4  Pain Location: R Knee Pain Descriptors / Indicators: Aching;Sore;Tender;Operative site guarding Pain Intervention(s): Monitored during session  Home Living                                          Prior Functioning/Environment              Frequency  Min 1X/week     Progress Toward Goals  OT Goals(current goals can now be found in the care plan section)  Progress towards OT goals: Progressing toward goals  Acute Rehab OT Goals Patient Stated Goal: to go home OT Goal Formulation: With patient/family Time For Goal Achievement: 02/23/16 Potential to Achieve Goals: Good  Plan Discharge plan remains appropriate    Co-evaluation                 End of Session     Activity Tolerance Patient tolerated treatment well;Treatment limited secondary to medical complications (Comment) (Standing and upright activity limited due to orthostatic BP drop during PT session just prior to OT session.)   Patient Left in bed;with call bell/phone within reach;with bed alarm set;with family/visitor present   Nurse Communication          Time: [id number] OT Time Calculation (min): 18 min  Charges: OT General Charges $OT Visit: 1 Procedure OT Treatments $Self Care/Home Management : 8-22 mins  Palin Tristan L 02/11/2016, 1:48 PM  Amie [place], OTR/L

## 2016-02-11 NOTE — Progress Notes (Signed)
Per report from AM shift RN Anderson Malta, pt had drainage on the operative site. On call Dr. Rudene Christians was paged and made aware. Dr Rudene Christians came and wound vac was placed. No drainage seen in the wound vac as of this this time. Will continue to monitor.

## 2016-02-11 NOTE — Progress Notes (Signed)
[place] [facility] [place] NAME: Daniel Austin    MR#:  [id number]  DATE OF BIRTH:  [birth year]  SUBJECTIVE:  CHIEF COMPLAINT:  No chief complaint on file. doing well, waiting for BM today before D/C REVIEW OF SYSTEMS:  Review of Systems  Constitutional: Negative for fever, weight loss, malaise/fatigue and diaphoresis.  HENT: Negative for ear discharge, ear pain, hearing loss, nosebleeds, sore throat and tinnitus.   Eyes: Negative for blurred vision and pain.  Respiratory: Negative for cough, hemoptysis, shortness of breath and wheezing.   Cardiovascular: Negative for chest pain, palpitations, orthopnea and leg swelling.  Gastrointestinal: Positive for constipation. Negative for heartburn, nausea, vomiting, abdominal pain, diarrhea and blood in stool.  Genitourinary: Negative for dysuria, urgency and frequency.  Musculoskeletal: Negative for myalgias and back pain.  Skin: Negative for itching and rash.  Neurological: Negative for dizziness, tingling, tremors, focal weakness, seizures, weakness and headaches.  Psychiatric/Behavioral: Negative for depression. The patient is not nervous/anxious.    DRUG ALLERGIES:   Allergies  Allergen Reactions  . Beta Adrenergic Blockers Other (See Comments)    Second Degree Heart Block  . Lipitor [Atorvastatin] Other (See Comments)    Myalgia Other Reaction: myalgia Myalgia    VITALS:  Blood pressure 117/64, pulse 89, temperature 98.2 F (36.8 C), temperature source Oral, resp. rate 20, height 5\' 10"  (1.778 m), weight 90.719 kg (200 lb), SpO2 98 %. PHYSICAL EXAMINATION:  Physical Exam  Constitutional: He is oriented to person, place, and time and well-developed, well-nourished, and in no distress.  HENT:  Head: Normocephalic and atraumatic.  Eyes: Conjunctivae and EOM are normal. Pupils are equal, round, and reactive to light.  Neck: Normal range of motion. Neck supple. No tracheal deviation  present. No thyromegaly present.  Cardiovascular: Normal rate, regular rhythm and normal heart sounds.   Pulmonary/Chest: Effort normal and breath sounds normal. No respiratory distress. He has no wheezes. He exhibits no tenderness.  Abdominal: Soft. Bowel sounds are normal. He exhibits no distension. There is no tenderness.  Musculoskeletal: Normal range of motion.  Neurological: He is alert and oriented to person, place, and time. No cranial nerve deficit.  Skin: Skin is warm and dry. No rash noted.  Rt Knee - dressing clean, dry, no/minimal drainage,  Hemovac intact.  Psychiatric: Mood and affect normal.   LABORATORY PANEL:   CBC  Recent Labs Lab 02/11/16 0352  WBC 5.4  HGB 8.1*  HCT 24.5*  PLT 150   ------------------------------------------------------------------------------------------------------------------ Chemistries   Recent Labs Lab 02/10/16 0448  NA 137  K 3.7  CL 107  CO2 25  GLUCOSE 114*  BUN 8  CREATININE 0.77  CALCIUM 8.2*   RADIOLOGY:  No results found. ASSESSMENT AND PLAN:  Daniel Austin is a 75 y.o. male with a known history of DJD, coronary artery disease, anemia, hypertension, hyperlipidemia, was admitted on the order service for elective right total knee arthroplasty. Patient underwent surgery under spinal anesthesia.  1. Orthostatic hypotension - much better - stopped metoprolol, lisinopril and nitroglycerin - can be due to SSRIs (on celexa) - he follows with Dr Saralyn Pilar - further w/up as an outpt   2. History of CAD - continue cardiac meds after blood pressure stable - can resume plavix per Ortho if no further bleeding at surgical site.  3. Hypothyroidism on Synthroid  4. Hyperlipidemia on Crestor  5. DVT prophylaxis per ortho     All the records are reviewed and case discussed with  Care Management/Social Worker. Management plans discussed with the patient, family and they are in agreement.  CODE STATUS: Full code  TOTAL  TIME TAKING CARE OF THIS PATIENT: 15 minutes.   More than 50% of the time was spent in counseling/coordination of care: YES  D/C per primary team - likely today if BM +   [facility], Jude Naclerio M.D on 02/11/2016 at 8:07 AM  Between 7am to 6pm - Pager - [phone]  After 6pm go to [email or web address] - password [email or web address] [place]  Office  [phone]  CC: Primary care physician; Einar Pheasant, MD  Note: This dictation was prepared with Dragon dictation along with smaller phrase technology. Any transcriptional errors that result from this process are unintentional.

## 2016-02-11 NOTE — Progress Notes (Signed)
Patient has had persisting drainage today through his bandage. Because of this I have placed a wound VAC over the incision. The drainage appears to be coming from his 2 small incisions for pin sites for navigation. Application of the wound VAC there is no apparent drainage. I discussed with him and his wife that if there is no drainage with the wound VAC in place its putting compression on and getting the skin the seal and he might be able go to the rehabilitation facility tomorrow with a Provena attachment, but if there is significant drainage underwent to wait another day with the wound VAC on prior to discharge. Additionally CBC is pending as he has had some orthostatic a hypotension with PT getting him up.

## 2016-02-12 LAB — CBC
HEMATOCRIT: 22.6 % — AB (ref 40.0–52.0)
HEMOGLOBIN: 7.7 g/dL — AB (ref 13.0–18.0)
MCH: 29.6 pg (ref 26.0–34.0)
MCHC: 34.3 g/dL (ref 32.0–36.0)
MCV: 86.2 fL (ref 80.0–100.0)
Platelets: 160 10*3/uL (ref 150–440)
RBC: 2.62 MIL/uL — AB (ref 4.40–5.90)
RDW: 13.8 % (ref 11.5–14.5)
WBC: 5.6 10*3/uL (ref 3.8–10.6)

## 2016-02-12 MED ORDER — SENNOSIDES-DOCUSATE SODIUM 8.6-50 MG PO TABS
2.0000 | ORAL_TABLET | Freq: Two times a day (BID) | ORAL | Status: DC
  Administered 2016-02-12 – 2016-02-14 (×5): 2 via ORAL
  Filled 2016-02-12 (×5): qty 2

## 2016-02-12 NOTE — Clinical Social Work Note (Signed)
CSW spoke with MD and pt will not be discharged today. CSW updated facility. CSW will continue to follow to facilitate discharge to [facility].   Darden Dates, MSW, LCSW Clinical Social Worker  [phone]

## 2016-02-12 NOTE — Progress Notes (Addendum)
Subjective: 4 Days Post-Op Procedure(s) (LRB): COMPUTER ASSISTED TOTAL KNEE ARTHROPLASTY (Right) Patient reports pain as mild.   Patient is well but is having issues with constipation and continued right knee drainage.. Plan is to go to rehabilitation after hospital stay. Negative for chest pain and shortness of breath Fever: no Gastrointestinal: Negative for nausea and vomiting  Objective: Vital signs in last 24 hours: Temp:  [97.7 F (36.5 C)-98.7 F (37.1 C)] 97.8 F (36.6 C) (05/21 0901) Pulse Rate:  [95-106] 102 (05/21 0901) Resp:  [16-22] 16 (05/21 0901) BP: (110-125)/(60-66) 117/60 mmHg (05/21 0901) SpO2:  [97 %-98 %] 98 % (05/21 0901)  Intake/Output from previous day:  Intake/Output Summary (Last 24 hours) at 02/12/16 0928 Last data filed at 02/12/16 [id number]  Gross per 24 hour  Intake    600 ml  Output    250 ml  Net    350 ml    Intake/Output this shift:    Labs:  Recent Labs  02/10/16 0448 02/11/16 0352 02/12/16 0325  HGB 8.8* 8.1* 7.7*    Recent Labs  02/11/16 0352 02/12/16 0325  WBC 5.4 5.6  RBC 2.83* 2.62*  HCT 24.5* 22.6*  PLT 150 160    Recent Labs  02/10/16 0448  NA 137  K 3.7  CL 107  CO2 25  BUN 8  CREATININE 0.77  GLUCOSE 114*  CALCIUM 8.2*   No results for input(s): LABPT, INR in the last 72 hours.   EXAM General - Patient is Alert and Oriented Extremity - Sensation intact distally Dorsiflexion/Plantar flexion intact Compartment soft Dressing/Incision - Woundvac in place to the right knee.  Mild bloody drainage noted from the right knee Motor Function - intact, moving foot and toes well on exam. The patient is able to do a straight leg raise independently. Pt ambulated 25 two days ago and 5 feet yesterday.  Abdomen is slightly distended, normal BS without tympany.  Past Medical History  Diagnosis Date  . Hypercholesterolemia   . Hypothyroidism   . Hypertension   . Fractures, multiple     Metal rod in left foot  .  CAD (coronary artery disease)   . Carotid artery stenosis     high-grade symptomatic right sided s/p stent twice  . Syncope     second degree heart block s/p St Jude's pacemaker placement 12/08 with his history is limited with any  . Anemia   . Ulcer of gastric fundus   . Hyperglycemia   . Heart block   . Shortness of breath dyspnea   . GERD (gastroesophageal reflux disease)   . Arthritis   . Knee pain   . Presence of permanent cardiac pacemaker   . Depression   . CHF (congestive heart failure) (HCC)     chronic movement fluid and history of CHF he noticed in the   . Cardiomyopathy ([place])   . Stroke [facility])     clot in neck broke off left side weakness 2008  resolved    Assessment/Plan: 4 Days Post-Op Procedure(s) (LRB): COMPUTER ASSISTED TOTAL KNEE ARTHROPLASTY (Right) Active Problems:   S/P total knee arthroplasty  Estimated body mass index is 28.7 kg/(m^2) as calculated from the following:   Height as of this encounter: 5\' 10"  (1.778 m).   Weight as of this encounter: 90.719 kg (200 lb). Pt still has not had a BM, will move on to a FLEET enema for today. Up with physical therapy today. Medicine recommending following up as a outpatient for orthostatic  hypotension   HG 7.7 this AM, moderate bloody drainage continuing to the right knee.  Will check CBC in the AM for potential transfusion. Will keep Woundvac in place on the right knee for today.  DVT Prophylaxis - Lovenox, Foot Pumps and TED hose Weight-Bearing as tolerated to right leg  J. Cameron Proud, PA-C Orthopaedic Surgery 02/12/2016, 9:28 AM  Discussed with patient and his family that I don't feel comfortable letting him leave the amount of drainage is currently having will continue wound VAC reassess tomorrow with CBC tomorrow with his hemoglobin 7.7 today

## 2016-02-12 NOTE — Progress Notes (Addendum)
[place] [facility] [place] NAME: Daniel Austin    MR#:  [id number]  DATE OF BIRTH:  [birth year]  SUBJECTIVE:  CHIEF COMPLAINT:  No chief complaint on file. wound vac placed last night due to persistent drainage from the wound. Hb slowly trending down (9.6->7.7), slept well after ativan REVIEW OF SYSTEMS:  Review of Systems  Constitutional: Negative for fever, weight loss, malaise/fatigue and diaphoresis.  HENT: Negative for ear discharge, ear pain, hearing loss, nosebleeds, sore throat and tinnitus.   Eyes: Negative for blurred vision and pain.  Respiratory: Negative for cough, hemoptysis, shortness of breath and wheezing.   Cardiovascular: Negative for chest pain, palpitations, orthopnea and leg swelling.  Gastrointestinal: Positive for constipation. Negative for heartburn, nausea, vomiting, abdominal pain, diarrhea and blood in stool.  Genitourinary: Negative for dysuria, urgency and frequency.  Musculoskeletal: Negative for myalgias and back pain.  Skin: Negative for itching and rash.  Neurological: Negative for dizziness, tingling, tremors, focal weakness, seizures, weakness and headaches.  Psychiatric/Behavioral: Negative for depression. The patient is not nervous/anxious.    DRUG ALLERGIES:   Allergies  Allergen Reactions  . Beta Adrenergic Blockers Other (See Comments)    Second Degree Heart Block  . Lipitor [Atorvastatin] Other (See Comments)    Myalgia Other Reaction: myalgia Myalgia    VITALS:  Blood pressure 110/62, pulse 96, temperature 97.7 F (36.5 C), temperature source Oral, resp. rate 18, height 5\' 10"  (1.778 m), weight 90.719 kg (200 lb), SpO2 98 %. PHYSICAL EXAMINATION:  Physical Exam  Constitutional: He is oriented to person, place, and time and well-developed, well-nourished, and in no distress.  HENT:  Head: Normocephalic and atraumatic.  Eyes: Conjunctivae and EOM are normal. Pupils are equal, round, and  reactive to light.  Neck: Normal range of motion. Neck supple. No tracheal deviation present. No thyromegaly present.  Cardiovascular: Normal rate, regular rhythm and normal heart sounds.   Pulmonary/Chest: Effort normal and breath sounds normal. No respiratory distress. He has no wheezes. He exhibits no tenderness.  Abdominal: Soft. Bowel sounds are normal. He exhibits no distension. There is no tenderness.  Musculoskeletal: Normal range of motion.  Neurological: He is alert and oriented to person, place, and time. No cranial nerve deficit.  Skin: Skin is warm and dry. No rash noted.  Rt Knee - wound vac in place  Psychiatric: Mood and affect normal.   LABORATORY PANEL:   CBC  Recent Labs Lab 02/12/16 0325  WBC 5.6  HGB 7.7*  HCT 22.6*  PLT 160   ------------------------------------------------------------------------------------------------------------------ Chemistries   Recent Labs Lab 02/10/16 0448  NA 137  K 3.7  CL 107  CO2 25  GLUCOSE 114*  BUN 8  CREATININE 0.77  CALCIUM 8.2*   RADIOLOGY:  No results found. ASSESSMENT AND PLAN:  Daniel Austin is a 75 y.o. male with a known history of DJD, coronary artery disease, anemia, hypertension, hyperlipidemia, was admitted on the order service for elective right total knee arthroplasty.   * Anemia - acute blood loss intra and post-op - Hemoglobin slowly trending down (9.6-> 8.8-> 8.1-> 7.7) - Consider blood transfusion before discharge.  If continues to drop  1. Orthostatic hypotension - much better - stopped metoprolol, lisinopril and nitroglycerin - can be due to SSRIs (on celexa) - he follows with Dr Saralyn Pilar - further w/up as an outpt   2. History of CAD - continue cardiac meds after blood pressure stable - can resume plavix per Ortho if no  further bleeding at surgical site.  3. Hypothyroidism on Synthroid  4. Hyperlipidemia on Crestor  5. Rt TKA: POD 4 - required wound-vac application y'day due to  slow bleed from wound  6. Constipation - Likely due to narcotics - On Dulcolax, lactulose, milk of magnesia, Fleet and Senokot-s - Increased dose of Senokot-S to 2 tablets twice a day   DVT prophylaxis per ortho  Discontinue tele  We will sign off, please call us with any questions  All the records are reviewed and case discussed with Care Management/Social Worker. Management plans discussed with the patient, family and they are in agreement.  CODE STATUS: Full code  TOTAL TIME TAKING CARE OF THIS PATIENT: 15 minutes.   More than 50% of the time was spent in counseling/coordination of care: YES  D/C per primary team - likely today if BM +   [facility], Deyna Carbon M.D on 02/12/2016 at 8:53 AM  Between 7am to 6pm - Pager - [phone]  After 6pm go to [email or web address] - password [email or web address]  Office  [phone]  CC: Primary care physician; Einar Pheasant, MD  Note: This dictation was prepared with Dragon dictation along with smaller phrase technology. Any transcriptional errors that result from this process are unintentional.

## 2016-02-12 NOTE — Progress Notes (Signed)
During change of shift rounds, pt's operative site has been noted to have a serosanguinous drainage leaking towards the edges of the transparent dressing. Dr Rudene Christians came and changed the dressing. Wound vac in place with cycling every 2 minutes. Will continue to monitor.

## 2016-02-12 NOTE — Care Management Important Message (Signed)
Important Message  Patient Details  Name: Daniel Austin MRN: [id number] Date of Birth: 07-15-1941   Medicare Important Message Given:  Yes    Shaya Reddick A, RN 02/12/2016, 1:31 PM

## 2016-02-12 NOTE — Progress Notes (Signed)
Physical Therapy Treatment Patient Details Name: Daniel Austin MRN: [id number] DOB: 08-04-1941 Today's Date: 02/12/2016    History of Present Illness Pt is Daniel 75 y.o. male s/p elective R TKA secondary to OA 02/08/16.  Pt with low BP post-op and rapid response called (pt was dizzy and went unresponsive) 02/08/16 PM.  PMH includes SOB with exertion, CVA, L sided weakness (from 2008), metal rod L foot/ankle, CAD, pacemaker, heart block.    PT Comments    BP: 120/52 after first 25' amb, supine in recliner; 76/38 after 3rd 25' amb seated upright; 118/57 after reclined and rested end of session. Pt with increased gait distance this date, but unable to tolerate distances greater than 25' before becoming symptomatic.  Pt with good awareness of BP changes and verbalizes need to sit and rest.  Pt continues with good R LE strength and ROM.  Wound vac intact throughout with increased output noted after ambulation.  Cont with POC.    Follow Up Recommendations  SNF     Equipment Recommendations       Recommendations for Other Services       Precautions / Restrictions Precautions Precautions: Knee;Fall Precaution Comments: h/o syncope post surgery/procedure Restrictions Weight Bearing Restrictions: Yes RLE Weight Bearing: Weight bearing as tolerated    Mobility  Bed Mobility Overal bed mobility: Needs Assistance Bed Mobility: Supine to Sit     Supine to sit: Min assist;HOB elevated     General bed mobility comments: Assit for R LE and to scoot towards edge, using rails exiting R side  Transfers Overall transfer level: Needs assistance Equipment used: Rolling walker (2 wheeled) Transfers: Sit to/from Stand Sit to Stand: Mod assist         General transfer comment: Pushing up with B UE's on arm rests on chair, extra effort for push off and to get feet positioned under hips; Mod Daniel for balance with hand transfer to RW  Ambulation/Gait Ambulation/Gait assistance: Min  guard Ambulation Distance (Feet): 75 Feet Assistive device: Rolling walker (2 wheeled) Gait Pattern/deviations: Antalgic;Step-to pattern Gait velocity: decreased   General Gait Details: 25' x3 with seated rest break for 3-5 min in between bouts   Stairs            Wheelchair Mobility    Modified Rankin (Stroke Patients Only)       Balance                                    Cognition Arousal/Alertness: Awake/alert Behavior During Therapy: WFL for tasks assessed/performed Overall Cognitive Status: Within Functional Limits for tasks assessed                      Exercises Total Joint Exercises Ankle Circles/Pumps: AROM;Strengthening;Both;10 reps;Seated Quad Sets: AROM;Strengthening;Both;10 reps;Supine Short Arc Quad: AROM;Strengthening;Both;10 reps;Seated Heel Slides: AROM;Strengthening;Right;10 reps;Seated Hip ABduction/ADduction: AROM;Strengthening;Both;10 reps;Supine Straight Leg Raises: AROM;Strengthening;Both;10 reps;Supine Knee Flexion: AROM;Strengthening;Both;10 reps;Seated Goniometric ROM: R knee 15-85 (without overpressure due to bleeding)    General Comments General comments (skin integrity, edema, etc.): R knee wound vac intact; increased output after walking      Pertinent Vitals/Pain Pain Assessment: 0-10 Pain Score: 3  Pain Location: R knee Pain Descriptors / Indicators: Aching;Sore Pain Intervention(s): Limited activity within patient's tolerance    Home Living  Prior Function            PT Goals (current goals can now be found in the care plan section) Acute Rehab PT Goals Patient Stated Goal: to go home PT Goal Formulation: With patient Time For Goal Achievement: 02/23/16 Potential to Achieve Goals: Fair Progress towards PT goals: Progressing toward goals    Frequency  BID    PT Plan Current plan remains appropriate    Co-evaluation             End of Session Equipment  Utilized During Treatment: Gait belt Activity Tolerance: Patient tolerated treatment well;Other (comment) (low BP) Patient left: in chair;with call bell/phone within reach;with family/visitor present;with SCD's reapplied     Time: 0905-1003 PT Time Calculation (min) (ACUTE ONLY): 58 min  Charges:  $Gait Training: 23-37 mins $Therapeutic Exercise: 8-22 mins $Therapeutic Activity: 8-22 mins                    G Codes:      Daniel Austin Daniel Austin, PT Feb 15, 2016, 10:53 AM

## 2016-02-12 NOTE — Progress Notes (Signed)
Patient cooperative with care today.  During physical therapy patient had to sit during ambulation, became diaphoretic and blood pressure was checked after sitting and dropped to 76/38.  Patient blood pressure returned to normal following rest.  Had positive bowel movement.  Wound vac and wound vac dressing was found to be leaking blood, reinforced dressing for good seal and blood appears to continue to be going downward toward edge of clear dressing.  Per Dr. Rudene Christians changed dressing from continuous to cycling every 2 minutes.  Will continue to monitor.

## 2016-02-12 NOTE — Plan of Care (Signed)
Problem: Physical Regulation: Goal: Ability to maintain clinical measurements within normal limits will improve Outcome: Not Progressing Blood pressure dropped and patient became diaphoretic during physical therapy  Problem: Activity: Goal: Ability to tolerate increased activity will improve Outcome: Not Progressing Patient diaphoretic and blood pressure dropped during physical therapy  Problem: Physical Regulation: Goal: Postoperative complications will be avoided or minimized Outcome: Not Progressing Surgical site excessive draining even under wound vac dressing.

## 2016-02-12 NOTE — Progress Notes (Signed)
Made Dr. Rudene Christians aware of drainage, no new orders at this time, he will continue to monitor Labs and reassess. Drainage measures at 1"x1" through ace wrap. Discussed with patient and spouse, with verbal understanding.                       Note made on 02/11/16, chart error

## 2016-02-13 LAB — PREPARE RBC (CROSSMATCH)

## 2016-02-13 LAB — BASIC METABOLIC PANEL
ANION GAP: 6 (ref 5–15)
Anion gap: 5 (ref 5–15)
BUN: 15 mg/dL (ref 6–20)
BUN: 14 mg/dL (ref 6–20)
CALCIUM: 7.8 mg/dL — AB (ref 8.9–10.3)
CALCIUM: 7.8 mg/dL — AB (ref 8.9–10.3)
CO2: 28 mmol/L (ref 22–32)
CO2: 29 mmol/L (ref 22–32)
CREATININE: 0.8 mg/dL (ref 0.61–1.24)
CREATININE: 0.8 mg/dL (ref 0.61–1.24)
Chloride: 97 mmol/L — ABNORMAL LOW (ref 101–111)
Chloride: 98 mmol/L — ABNORMAL LOW (ref 101–111)
GFR calc non Af Amer: >60 mL/min (ref >60)
Glucose, Bld: 114 mg/dL — ABNORMAL HIGH (ref 65–99)
Glucose, Bld: 113 mg/dL — ABNORMAL HIGH (ref 65–99)
Potassium: 3.4 mmol/L — ABNORMAL LOW (ref 3.5–5.1)
Potassium: 3.5 mmol/L (ref 3.5–5.1)
SODIUM: 131 mmol/L — AB (ref 135–145)
SODIUM: 132 mmol/L — AB (ref 135–145)

## 2016-02-13 LAB — CBC
HCT: 22 % — ABNORMAL LOW (ref 40.0–52.0)
Hemoglobin: 7.5 g/dL — ABNORMAL LOW (ref 13.0–18.0)
MCH: 29.4 pg (ref 26.0–34.0)
MCHC: 34.2 g/dL (ref 32.0–36.0)
MCV: 85.9 fL (ref 80.0–100.0)
Platelets: 182 10*3/uL (ref 150–440)
RBC: 2.56 MIL/uL — ABNORMAL LOW (ref 4.40–5.90)
RDW: 13.7 % (ref 11.5–14.5)
WBC: 5.6 10*3/uL (ref 3.8–10.6)

## 2016-02-13 MED ORDER — SODIUM CHLORIDE 0.9 % IV SOLN
Freq: Once | INTRAVENOUS | Status: AC
  Administered 2016-02-13: 11:00:00 via INTRAVENOUS

## 2016-02-13 NOTE — Progress Notes (Signed)
ORTHOPAEDICS PROGRESS NOTE  PATIENT NAME: Daniel Austin DOB: 1940/11/10  MRN: [id number]  POD # 5: Right total knee arthroplasty Acute blood loss anemia Coronary artery disease Ischemic cardiomyopathy Carotid artery stenosis Hypothyroidism   The patient was transfused with 1 unit of packed red blood cells today without difficulty. The wound VAC was changed as per nursing and had apparently occluded due to a large clot. Blood pressure has remained stable today, although the patient is still somewhat tachycardic. He denies any chest pain or shortness of breath.  The patient is in good spirits tonight. The dressing is now dry and intact and the wound VAC is functioning without difficulty. Scant fluid is present in the wound VAC reservoir. Homans test is negative.  Repeat labs in the morning. (Posttransfusion hemoglobin was not drawn.) Findings were discussed with the patient and his wife. Possible transfer to skilled nursing tomorrow.   James P. Holley Bouche M.D.

## 2016-02-13 NOTE — Progress Notes (Signed)
   Subjective: 5 Days Post-Op Procedure(s) (LRB): COMPUTER ASSISTED TOTAL KNEE ARTHROPLASTY (Right) Patient reports pain as mild.   Patient is well, and has had no acute complaints or problems Continue with  therapy today.  Plan is to go Rehab after hospital stay. no nausea and no vomiting Patient denies any chest pains or shortness of breath. Symptomatic during therapy yesterday 2/2 decreased hgb and hypotension.  Objective: Vital signs in last 24 hours: Temp:  [97.8 F (36.6 C)-98.2 F (36.8 C)] 98.2 F (36.8 C) (05/22 0504) Pulse Rate:  [97-106] 97 (05/22 0504) Resp:  [16-20] 20 (05/22 0504) BP: (76-130)/(38-70) 121/69 mmHg (05/22 0504) SpO2:  [95 %-98 %] 98 % (05/22 0504) Wound vac in place Heels are non tender and elevated off the bed using rolled towels Intake/Output from previous day: 05/21 0701 - 05/22 0700 In: 720 [P.O.:720] Out: 640 [Urine:550; Drains:90] Intake/Output this shift:     Recent Labs  02/11/16 0352 02/12/16 0325 02/13/16 0339  HGB 8.1* 7.7* 7.5*    Recent Labs  02/12/16 0325 02/13/16 0339  WBC 5.6 5.6  RBC 2.62* 2.56*  HCT 22.6* 22.0*  PLT 160 182    Recent Labs  02/13/16 0339  NA 131*  K 3.4*  CL 97*  CO2 28  BUN 15  CREATININE 0.80  GLUCOSE 114*  CALCIUM 7.8*   No results for input(s): LABPT, INR in the last 72 hours.  EXAM General - Patient is Alert, Appropriate and Oriented Extremity - Neurologically intact Neurovascular intact Sensation intact distally Intact pulses distally Dorsiflexion/Plantar flexion intact Dressing - no change in dressing. wound vac in place. Motor Function - intact, moving foot and toes well on exam.    Past Medical History  Diagnosis Date  . Hypercholesterolemia   . Hypothyroidism   . Hypertension   . Fractures, multiple     Metal rod in left foot  . CAD (coronary artery disease)   . Carotid artery stenosis     high-grade symptomatic right sided s/p stent twice  . Syncope     second  degree heart block s/p [employer]'s pacemaker placement 12/08 with his history is limited with any  . Anemia   . Ulcer of gastric fundus   . Hyperglycemia   . Heart block   . Shortness of breath dyspnea   . GERD (gastroesophageal reflux disease)   . Arthritis   . Knee pain   . Presence of permanent cardiac pacemaker   . Depression   . CHF (congestive heart failure) (HCC)     chronic movement fluid and history of CHF he noticed in the   . Cardiomyopathy ([place])   . Stroke [facility])     clot in neck broke off left side weakness 2008  resolved    Assessment/Plan: 5 Days Post-Op Procedure(s) (LRB): COMPUTER ASSISTED TOTAL KNEE ARTHROPLASTY (Right) Active Problems:   S/P total knee arthroplasty  Estimated body mass index is 28.7 kg/(m^2) as calculated from the following:   Height as of this encounter: '5\' 10"'$  (1.778 m).   Weight as of this encounter: 90.719 kg (200 lb). Up with therapy Discharge to SNF possibly this pm after transfusion if BP, HR and hgb in stable  Labs: reviewed. hgb down to 7.5  DVT Prophylaxis - Lovenox, Foot Pumps and TED hose Weight-Bearing as tolerated to right leg Transfuse one unit of prbc. Met b after transfusion   [name]. [place] 02/13/2016, 7:11 AM

## 2016-02-13 NOTE — Progress Notes (Addendum)
Called Dr. Marry Guan to advise that dressing was leaking a substantial amount.  The wound vac was showing a seal with minimal leaking, but blood was finding a way out.  He advised to take down wound vac and replace with a brand new system, wrap with ABD pads and then ace bandage.  During removal of old dressing found large clot underneath wound vac.  Dr. Marry Guan will round later to reassess.

## 2016-02-13 NOTE — Progress Notes (Signed)
Physical Therapy Treatment Patient Details Name: Daniel Austin MRN: [id number] DOB: 1940-09-28 Today's Date: 02/13/2016    History of Present Illness Pt is a 75 y.o. male s/p elective R TKA secondary to OA 02/08/16.  Pt with low BP post-op and rapid response called (pt was dizzy and went unresponsive) 02/08/16 PM.  PMH includes SOB with exertion, CVA, L sided weakness (from 2008), metal rod L foot/ankle, CAD, pacemaker, heart block.    PT Comments    Pt agreeable to PT; pt currently being transfused, so PT held to bed exercises only. Pt/family educated on importance of right knee extension and position in bed with corrections made to position pt found in (knee flexed with Right lower extremity externally rotated out from hip) Pt participates well with all exercises progressing R knee extension rage. Plan to see pt this afternoon if not discharged to progress range, strength and all mobility to improve function.   Follow Up Recommendations  SNF     Equipment Recommendations       Recommendations for Other Services       Precautions / Restrictions Precautions Precautions: Knee;Fall Restrictions Weight Bearing Restrictions: Yes RLE Weight Bearing: Weight bearing as tolerated    Mobility  Bed Mobility               General bed mobility comments: Not tested; pt currently receiving transfusion  Transfers                    Ambulation/Gait                 Stairs            Wheelchair Mobility    Modified Rankin (Stroke Patients Only)       Balance                                    Cognition Arousal/Alertness: Awake/alert Behavior During Therapy: WFL for tasks assessed/performed Overall Cognitive Status: Within Functional Limits for tasks assessed                      Exercises Total Joint Exercises Ankle Circles/Pumps: AROM;Both;20 reps;Supine Quad Sets: Strengthening;Both;20 reps;Supine Gluteal Sets:  Strengthening;Both;20 reps;Supine Short Arc Quad: AROM;Both;20 reps;Supine Heel Slides: AAROM;Right;20 reps;Supine (L AROM) Hip ABduction/ADduction: AROM;Both;20 reps;Supine Straight Leg Raises: AROM;Right;10 reps;Supine (2 sets) Knee Flexion: AAROM;Right;10 reps (for stretch) Goniometric ROM: -4 to 75 degrees Other Exercises Other Exercises: education on importance of R knee extension and positioning as such in bed, chair. Pt initially found with RLE externally rotated at hip with knee in partial flexion. Repositioned in hip neutral on double ankle roll and pillow lateral knee.     General Comments General comments (skin integrity, edema, etc.): wound vac/bandage, CP. Bloody drainage on bandage and blanket under RLE      Pertinent Vitals/Pain Pain Assessment: 0-10 Pain Score: 2  (3 with bend) Pain Location: R knee Pain Intervention(s): Ice applied;Limited activity within patient's tolerance    Home Living                      Prior Function            PT Goals (current goals can now be found in the care plan section) Progress towards PT goals: Progressing toward goals    Frequency  BID    PT Plan Current plan remains appropriate  Co-evaluation             End of Session   Activity Tolerance: Patient tolerated treatment well Patient left: in bed;with call bell/phone within reach;with bed alarm set;with SCD's reapplied;Other (comment);with family/visitor present (Polar care in place)     Time: [phone] PT Time Calculation (min) (ACUTE ONLY): 29 min  Charges:  $Therapeutic Exercise: 23-37 mins                    G Codes:      Charlaine Dalton, PTA 02/13/2016, 1:51 PM

## 2016-02-13 NOTE — Progress Notes (Signed)
PT Cancellation Note  Patient Details Name: Daniel Austin MRN: [id number] DOB: 06-19-1941   Cancelled Treatment:    Reason Eval/Treat Not Completed: Other (comment). Treatment attempted 2 times. Initially pt noted to have significant bleeding at operative site; pt notes nursing is to return shortly to change dressing. Second attempt nursing in room nearly finished changing dressing and notes they wish to watch dressing before pt participates. Nursing to call therapist if pt able to be seen this afternoon; otherwise, PT will see pt tomorrow.   [place], [place] 02/13/2016, 3:21 PM

## 2016-02-13 NOTE — Progress Notes (Signed)
[place] [facility] [place] NAME: Daniel Austin    MR#:  [id number]  DATE OF BIRTH:  1941/07/15  SUBJECTIVE:  CHIEF COMPLAINT:  No chief complaint on file.  Patient felt lightheaded and dizzy on standing up with physical therapy. He still has leaking around the wound VAC on the right knee area. Getting 1 unit blood transfusion today. Blood pressure in the normal range now that his medications have been held.  REVIEW OF SYSTEMS:  Review of Systems  Constitutional: Positive for malaise/fatigue. Negative for fever, weight loss and diaphoresis.  HENT: Negative for ear discharge, ear pain, hearing loss, nosebleeds, sore throat and tinnitus.   Eyes: Negative for blurred vision and pain.  Respiratory: Negative for cough, hemoptysis, shortness of breath and wheezing.   Cardiovascular: Negative for chest pain, palpitations, orthopnea and leg swelling.  Gastrointestinal: Positive for constipation. Negative for heartburn, nausea, vomiting, abdominal pain, diarrhea and blood in stool.  Genitourinary: Negative for dysuria, urgency and frequency.  Musculoskeletal: Positive for joint pain. Negative for myalgias and back pain.  Skin: Negative for itching and rash.  Neurological: Positive for dizziness. Negative for tingling, tremors, focal weakness, seizures, weakness and headaches.  Psychiatric/Behavioral: Negative for depression. The patient is not nervous/anxious.    DRUG ALLERGIES:   Allergies  Allergen Reactions  . Beta Adrenergic Blockers Other (See Comments)    Second Degree Heart Block  . Lipitor [Atorvastatin] Other (See Comments)    Myalgia Other Reaction: myalgia Myalgia    VITALS:  Blood pressure 127/61, pulse 96, temperature 98.6 F (37 C), temperature source Oral, resp. rate 24, height 5\' 10"  (1.778 m), weight 90.719 kg (200 lb), SpO2 96 %. PHYSICAL EXAMINATION:  Physical Exam  Constitutional: He is oriented to person, place, and  time and well-developed, well-nourished, and in no distress.  HENT:  Head: Normocephalic and atraumatic.  Eyes: Conjunctivae and EOM are normal. Pupils are equal, round, and reactive to light.  Neck: Normal range of motion. Neck supple. No tracheal deviation present. No thyromegaly present.  Cardiovascular: Normal rate, regular rhythm and normal heart sounds.   Pulmonary/Chest: Effort normal and breath sounds normal. No respiratory distress. He has no wheezes. He exhibits no tenderness.  Abdominal: Soft. Bowel sounds are normal. He exhibits no distension. There is no tenderness.  Musculoskeletal: Normal range of motion.  Neurological: He is alert and oriented to person, place, and time. No cranial nerve deficit.  Skin: Skin is warm and dry. No rash noted.  Rt Knee - wound vac in place. Bloodsoaked dressing and sheets  Psychiatric: Mood and affect normal.   LABORATORY PANEL:   CBC  Recent Labs Lab 02/13/16 0339  WBC 5.6  HGB 7.5*  HCT 22.0*  PLT 182   ------------------------------------------------------------------------------------------------------------------ Chemistries   Recent Labs Lab 02/13/16 0339  NA 131*  K 3.4*  CL 97*  CO2 28  GLUCOSE 114*  BUN 15  CREATININE 0.80  CALCIUM 7.8*   RADIOLOGY:  No results found. ASSESSMENT AND PLAN:  Daniel Austin is a 75 y.o. male with a known history of DJD, coronary artery disease, anemia, hypertension, hyperlipidemia, was admitted on the order service for elective right total knee arthroplasty.   * Acute blood loss Anemia - Hemoglobin slowly trending down (9.6-> 8.8-> 8.1-> 7.7->7.5) - Patient receiving 1 unit packed RBC today. Follow hemoglobin.  * Orthostatic hypotension - stopped metoprolol, lisinopril and nitroglycerin - This seems to be a chronic problem worsened by his anemia.  *  History of CAD - continue cardiac meds after blood pressure stable - can resume plavix per Ortho if no further bleeding at  surgical site.  * Hypothyroidism on Synthroid  * Hyperlipidemia on Crestor  * Rt TKA: POD#5 - As per orthopedics  * Constipation - Likely due to narcotics - On Dulcolax, lactulose, milk of magnesia, Fleet and Senokot-s  DVT prophylaxis per ortho  All the records are reviewed and case discussed with Care Management/Social Worker. Management plans discussed with the patient, family and they are in agreement.  CODE STATUS: Full code  TOTAL TIME TAKING CARE OF THIS PATIENT: 25 minutes.   Possible discharge in 1-2 days.   Hillary Bow R M.D on 02/13/2016 at 1:18 PM  Between 7am to 6pm - Pager - [phone]  After 6pm go to [email or web address] - password [email or web address] [facility]  Office  [phone]  CC: Primary care physician; Einar Pheasant, MD  Note: This dictation was prepared with Dragon dictation along with smaller phrase technology. Any transcriptional errors that result from this process are unintentional.

## 2016-02-13 NOTE — Progress Notes (Signed)
Per MD and RN patient is likely not ready for D/C today. Per RN in progression rounds patient will discharge with a provena wound vac. Clinical Education officer, museum (CSW) contacted [employer] and made Crystal aware of above. Plan is for patient to D/C to [facility] when medically stable. CSW will continue to follow and assist as needed.   Blima Rich, LCSW [phone]

## 2016-02-13 NOTE — Progress Notes (Signed)
New wound vac seal completely intact, no drainage and dressing clean.

## 2016-02-13 NOTE — Progress Notes (Signed)
Physical Therapy Treatment Patient Details Name: Daniel Austin MRN: [id number] DOB: 07/18/1941 Today's Date: 02/13/2016    History of Present Illness Pt is a 75 y.o. male s/p elective R TKA secondary to OA 02/08/16.  Pt with low BP post-op and rapid response called (pt was dizzy and went unresponsive) 02/08/16 PM.  PMH includes SOB with exertion, CVA, L sided weakness (from 2008), metal rod L foot/ankle, CAD, pacemaker, heart block.    PT Comments    Rechecked with nursing as to whether or not pt was able to participate with PT. Nursing re checked new bandage with no visible bleeding currently. Nursing agreeable to light bed exercises. Pt with mild increase in pain at this time, but wishes to manage without additional pain medications. Pt participated in light bed exercises bilaterally avoiding any significant stress to R knee and no increased pain. Continue PT to progress range, strength, and all mobility as medically appropriate to improve function.   Follow Up Recommendations  SNF     Equipment Recommendations       Recommendations for Other Services       Precautions / Restrictions Precautions Precautions: Knee;Fall Restrictions Weight Bearing Restrictions: Yes RLE Weight Bearing: Weight bearing as tolerated    Mobility  Bed Mobility               General bed mobility comments: Not tested. Per nursing bed exercises only at this time, as they are monitoring re dressing and bleeding  Transfers                    Ambulation/Gait                 Stairs            Wheelchair Mobility    Modified Rankin (Stroke Patients Only)       Balance                                    Cognition Arousal/Alertness: Awake/alert Behavior During Therapy: WFL for tasks assessed/performed Overall Cognitive Status: Within Functional Limits for tasks assessed                      Exercises Total Joint Exercises Ankle  Circles/Pumps: AROM;Both;20 reps;Supine Quad Sets: Strengthening;Both;20 reps;Supine Gluteal Sets: Strengthening;Both;20 reps;Supine Short Arc Quad: AROM;Left;20 reps;Supine Heel Slides: AAROM;Right;20 reps;Supine (AROM on L 20x) Hip ABduction/ADduction: Strengthening;Left;20 reps;Supine Straight Leg Raises: AROM;Right;10 reps;Supine (2 sets) Knee Flexion: AAROM;Right;10 reps (for stretch) Goniometric ROM: -4 to 75 degrees Other Exercises Other Exercises: reinforced education on position    General Comments General comments (skin integrity, edema, etc.): new dressing/bandage at this time free of bleeding. Nursing rechecked beginning of session. Wound vac and polar care in place      Pertinent Vitals/Pain Pain Assessment: 0-10 Pain Score: 3  Pain Location: R knee/shin Pain Descriptors / Indicators: Aching;Tightness;Tender Pain Intervention(s): Limited activity within patient's tolerance;Monitored during session;Ice applied (pt trying to wait until next med dose vs. adding additional )    Home Living                      Prior Function            PT Goals (current goals can now be found in the care plan section) Progress towards PT goals: Progressing toward goals (slow due to several complications)  Frequency  BID    PT Plan Current plan remains appropriate    Co-evaluation             End of Session   Activity Tolerance: Patient limited by pain;Patient limited by fatigue Patient left: in bed;with call bell/phone within reach;with bed alarm set;with family/visitor present;with SCD's reapplied;Other (comment) (polar care)     Time: [id number] PT Time Calculation (min) (ACUTE ONLY): 15 min  Charges:  $Therapeutic Exercise: 8-22 mins                    G Codes:      Charlaine Dalton, PTA 02/13/2016, 4:56 PM

## 2016-02-14 DIAGNOSIS — I5022 Chronic systolic (congestive) heart failure: Secondary | ICD-10-CM | POA: Diagnosis not present

## 2016-02-14 DIAGNOSIS — I9589 Other hypotension: Secondary | ICD-10-CM | POA: Diagnosis not present

## 2016-02-14 DIAGNOSIS — R262 Difficulty in walking, not elsewhere classified: Secondary | ICD-10-CM | POA: Diagnosis not present

## 2016-02-14 DIAGNOSIS — Z471 Aftercare following joint replacement surgery: Secondary | ICD-10-CM | POA: Diagnosis not present

## 2016-02-14 DIAGNOSIS — E039 Hypothyroidism, unspecified: Secondary | ICD-10-CM | POA: Diagnosis not present

## 2016-02-14 DIAGNOSIS — R55 Syncope and collapse: Secondary | ICD-10-CM | POA: Diagnosis not present

## 2016-02-14 DIAGNOSIS — M1990 Unspecified osteoarthritis, unspecified site: Secondary | ICD-10-CM | POA: Diagnosis not present

## 2016-02-14 DIAGNOSIS — R130 Aphagia: Secondary | ICD-10-CM | POA: Diagnosis not present

## 2016-02-14 DIAGNOSIS — K219 Gastro-esophageal reflux disease without esophagitis: Secondary | ICD-10-CM | POA: Diagnosis not present

## 2016-02-14 DIAGNOSIS — R1310 Dysphagia, unspecified: Secondary | ICD-10-CM | POA: Diagnosis not present

## 2016-02-14 DIAGNOSIS — R6889 Other general symptoms and signs: Secondary | ICD-10-CM | POA: Diagnosis not present

## 2016-02-14 DIAGNOSIS — I10 Essential (primary) hypertension: Secondary | ICD-10-CM | POA: Diagnosis not present

## 2016-02-14 DIAGNOSIS — Z96651 Presence of right artificial knee joint: Secondary | ICD-10-CM | POA: Diagnosis not present

## 2016-02-14 DIAGNOSIS — D649 Anemia, unspecified: Secondary | ICD-10-CM | POA: Diagnosis not present

## 2016-02-14 DIAGNOSIS — E119 Type 2 diabetes mellitus without complications: Secondary | ICD-10-CM | POA: Diagnosis not present

## 2016-02-14 DIAGNOSIS — Z96659 Presence of unspecified artificial knee joint: Secondary | ICD-10-CM | POA: Diagnosis not present

## 2016-02-14 DIAGNOSIS — Z741 Need for assistance with personal care: Secondary | ICD-10-CM | POA: Diagnosis not present

## 2016-02-14 DIAGNOSIS — D62 Acute posthemorrhagic anemia: Secondary | ICD-10-CM | POA: Diagnosis not present

## 2016-02-14 DIAGNOSIS — Z7401 Bed confinement status: Secondary | ICD-10-CM | POA: Diagnosis not present

## 2016-02-14 DIAGNOSIS — I2510 Atherosclerotic heart disease of native coronary artery without angina pectoris: Secondary | ICD-10-CM | POA: Diagnosis not present

## 2016-02-14 DIAGNOSIS — M6281 Muscle weakness (generalized): Secondary | ICD-10-CM | POA: Diagnosis not present

## 2016-02-14 LAB — TYPE AND SCREEN
ABO/RH(D): O POS
Antibody Screen: NEGATIVE
Unit division: 0

## 2016-02-14 LAB — HEMOGLOBIN: HEMOGLOBIN: 8.6 g/dL — AB (ref 13.0–18.0)

## 2016-02-14 MED ORDER — ENOXAPARIN SODIUM 40 MG/0.4ML ~~LOC~~ SOLN: 40.0000 mg | SUBCUTANEOUS | Status: DC

## 2016-02-14 NOTE — Progress Notes (Signed)
   Subjective: 6 Days Post-Op Procedure(s) (LRB): COMPUTER ASSISTED TOTAL KNEE ARTHROPLASTY (Right) Patient reports pain as mild.   Patient is well, and has had no acute complaints or problems Continue with physical therapy today.  Plan is to go Rehab after hospital stay. no nausea and no vomiting Patient denies any chest pains or shortness of breath.  Objective: Vital signs in last 24 hours: Temp:  [98.1 F (36.7 C)-99 F (37.2 C)] 98.3 F (36.8 C) (05/23 0736) Pulse Rate:  [94-112] 94 (05/23 0736) Resp:  [16-24] 20 (05/23 0736) BP: (106-131)/(47-65) 120/65 mmHg (05/23 0736) SpO2:  [94 %-100 %] 95 % (05/23 0736) Heels are non tender and elevated off the bed using rolled towels Wound VAC to right knee is in place and functioning well. No evidence of any leakage.  Intake/Output from previous day: 05/22 0701 - 05/23 0700 In: 611 [P.O.:240; Blood:371] Out: 1200 [Urine:1200] Intake/Output this shift:     Recent Labs  02/12/16 0325 02/13/16 0339 02/14/16 0555  HGB 7.7* 7.5* 8.6*    Recent Labs  02/12/16 0325 02/13/16 0339  WBC 5.6 5.6  RBC 2.62* 2.56*  HCT 22.6* 22.0*  PLT 160 182    Recent Labs  02/13/16 0339 02/13/16 1546  NA 131* 132*  K 3.4* 3.5  CL 97* 98*  CO2 28 29  BUN 15 14  CREATININE 0.80 0.80  GLUCOSE 114* 113*  CALCIUM 7.8* 7.8*   No results for input(s): LABPT, INR in the last 72 hours.  EXAM General - Patient is Alert, Appropriate and Oriented Extremity - Neurologically intact Neurovascular intact Sensation intact distally Intact pulses distally Dorsiflexion/Plantar flexion intact Compartment soft Dressing - dressing C/D/I Motor Function - intact, moving foot and toes well on exam.    Past Medical History  Diagnosis Date  . Hypercholesterolemia   . Hypothyroidism   . Hypertension   . Fractures, multiple     Metal rod in left foot  . CAD (coronary artery disease)   . Carotid artery stenosis     high-grade symptomatic right  sided s/p stent twice  . Syncope     second degree heart block s/p St Jude's pacemaker placement 12/08 with his history is limited with any  . Anemia   . Ulcer of gastric fundus   . Hyperglycemia   . Heart block   . Shortness of breath dyspnea   . GERD (gastroesophageal reflux disease)   . Arthritis   . Knee pain   . Presence of permanent cardiac pacemaker   . Depression   . CHF (congestive heart failure) (HCC)     chronic movement fluid and history of CHF he noticed in the   . Cardiomyopathy ([place])   . Stroke [facility])     clot in neck broke off left side weakness 2008  resolved    Assessment/Plan: 6 Days Post-Op Procedure(s) (LRB): COMPUTER ASSISTED TOTAL KNEE ARTHROPLASTY (Right) Active Problems:   S/P total knee arthroplasty  Estimated body mass index is 28.7 kg/(m^2) as calculated from the following:   Height as of this encounter: 5\' 10"  (1.778 m).   Weight as of this encounter: 90.719 kg (200 lb). Up with therapy Discharge to SNF  Labs: Were reviewed. Hemoglobin 8.6. DVT Prophylaxis - Lovenox, Foot Pumps and TED hose Weight-Bearing as tolerated to right leg Patient needs to have a bowel movement today prior to being discharged  Tima Curet R. [place] 02/14/2016, 7:50 AM

## 2016-02-14 NOTE — Progress Notes (Signed)
Patient is medically stable for D/C to [facility] today. Per Bill admissions coordinator at [facility] patient will go to room E-9. RN will call report and arrange EMS for transport. Clinical Education officer, museum (CSW) sent D/C Summary, FL2 and D/C Packet to [employer] via [employer]. Per RN patient will D/C with a provena disposable wound vac. [facility] is aware of provena. Patient is aware of above. Patient's daughter Loma Sousa is at bedside and aware of above. Please reconsult if future social work needs arise. CSW signing off.   Blima Rich, LCSW [phone]

## 2016-02-14 NOTE — Discharge Summary (Signed)
Physician Discharge Summary  Patient ID: Daniel Austin MRN: [id number] DOB/AGE: August 22, 1941 75 y.o.  Admit date: 02/08/2016 Discharge date: 02/13/2016 Admission Diagnoses:  OSTEOARTHRITIS RIGHT KNEE   Discharge Diagnoses: Patient Active Problem List   Diagnosis Date Noted  . S/P total knee arthroplasty 02/08/2016  . Chronic systolic heart failure ([place]) 12/13/2015  . HLD (hyperlipidemia) 12/13/2015  . BP (high blood pressure) 12/13/2015  . Cardiomyopathy, ischemic 12/13/2015  . MI (mitral incompetence) 12/13/2015  . Billowing mitral valve 12/13/2015  . Nonsustained ventricular tachycardia ([place]) 12/13/2015  . Carotid stenosis 12/13/2015  . Episode of syncope 12/13/2015  . Disease of thyroid gland 12/13/2015  . Health care maintenance 09/04/2015  . Encounter for completion of form with patient 01/12/2015  . Left arm numbness 09/30/2014  . Disturbance of skin sensation 09/30/2014  . Ischemia of upper extremity 09/30/2014  . Pins and needles sensation 09/30/2014  . Unspecified visual disturbance 09/30/2014  . Primary osteoarthritis of both knees 09/29/2014  . History of cardiac catheterization 05/12/2014  . Change in vision 01/09/2014  . Knee pain, bilateral 01/09/2014  . Arthralgia of lower leg 01/09/2014  . Left knee pain 09/13/2013  . Hyperglycemia 11/05/2012  . Carotid artery stenosis 11/05/2012  . Carotid artery narrowing 11/05/2012  . Carotid artery obstruction 11/05/2012  . Abnormal blood sugar 11/05/2012  . Hypothyroidism 10/31/2012  . Pure hypercholesterolemia 10/31/2012  . Dysphagia, unspecified(787.20) 10/31/2012  . Coronary atherosclerosis of native coronary artery 10/31/2012  . Anemia 10/31/2012  . Essential hypertension, benign 10/31/2012  . Absolute anemia 10/31/2012  . Benign essential HTN 10/31/2012  . Can't get food down 10/31/2012  . Adult hypothyroidism 10/31/2012    Past Medical History  Diagnosis Date  . Hypercholesterolemia   .  Hypothyroidism   . Hypertension   . Fractures, multiple     Metal rod in left foot  . CAD (coronary artery disease)   . Carotid artery stenosis     high-grade symptomatic right sided s/p stent twice  . Syncope     second degree heart block s/p St Jude's pacemaker placement 12/08 with his history is limited with any  . Anemia   . Ulcer of gastric fundus   . Hyperglycemia   . Heart block   . Shortness of breath dyspnea   . GERD (gastroesophageal reflux disease)   . Arthritis   . Knee pain   . Presence of permanent cardiac pacemaker   . Depression   . CHF (congestive heart failure) (HCC)     chronic movement fluid and history of CHF he noticed in the   . Cardiomyopathy ([place])   . Stroke [facility])     clot in neck broke off left side weakness 2008  resolved     Transfusion: Autovac transfusions given the first 6 hours postoperatively. Patient also received 1 unit of packed RBCs on day #4 secondary to hemoglobin of 7.5 with patient being hypertensive, tachycardia and dizzy.   Consultants (if any): Treatment Team:  Fritzi Mandes, MD: Case management for assistance with placement  Discharged Condition: Improved  Hospital Course: Daniel Austin is an 75 y.o. male who was admitted 02/08/2016 with a diagnosis of degenerative arthrosis right knee and went to the operating room on 02/08/2016 and underwent the above named procedures.    Surgeries:Procedure(s): COMPUTER ASSISTED TOTAL KNEE ARTHROPLASTY on 02/08/2016  PRE-OPERATIVE DIAGNOSIS: Degenerative arthrosis of the right knee, primary  POST-OPERATIVE DIAGNOSIS: Same  PROCEDURE: Right total knee arthroplasty using computer-assisted navigation  SURGEON: Dereck Leep,  Brooke Bonito. M.D.  ASSISTANT: Vance Peper, PA (present and scrubbed throughout the case, critical for assistance with exposure, retraction, instrumentation, and closure)  ANESTHESIA: spinal  ESTIMATED BLOOD LOSS: 200 mL  FLUIDS REPLACED: 1700 mL of crystalloid  TOURNIQUET  TIME: 100 minutes  DRAINS: 2 medium drains to a reinfusion system  SOFT TISSUE RELEASES: Anterior cruciate ligament, posterior cruciate ligament, deep and superficial medial collateral ligament, patellofemoral ligament   IMPLANTS UTILIZED: DePuy Attune size 8 posterior stabilized femoral component (cemented), size 8 rotating platform tibial component (cemented), 41 mm medialized dome patella (cemented), and a 41 mm stabilized rotating platform polyethylene insert.  INDICATIONS FOR SURGERY: Daniel Austin is a 75 y.o. year old male with a long history of progressive knee pain. X-rays demonstrated severe degenerative changes in tricompartmental fashion. The patient had not seen any significant improvement despite conservative nonsurgical intervention. After discussion of the risks and benefits of surgical intervention, the patient expressed understanding of the risks benefits and agree with plans for total knee arthroplasty.   The risks, benefits, and alternatives were discussed at length including but not limited to the risks of infection, bleeding, nerve injury, stiffness, blood clots, the need for revision surgery, cardiopulmonary complications, among others, and they were willing to proceed. Patient tolerated the surgery well. No complications .Patient was taken to PACU where she was stabilized and then transferred to the orthopedic floor.  Patient started on Lovenox 30 q 12 hrs. Foot pumps applied bilaterally at 80 mm hg. Heels elevated off bed with rolled towels. No evidence of DVT. Calves non tender. Negative Homan. Physical therapy started on day #1 for gait training and transfer with OT starting on  day #1 for ADL and assisted devices. Patient has done well with therapy. Ambulated only short distance upon being discharged secondary to patient being dizzy and lightheaded and hypertensive as well some tachycardia.  Patient's IV and Foley were discontinued on day #1 with Hemovac being  discontinued on day #2. However due to increased drainage a wound VAC was applied. Hemovac developed a blood clot secondarily there was seepage around the serial of the wound VAC and had to be reapplied. Upon being discharged home with looking very good and drainage had significantly reduced.  Patient did receive 1 unit of packed RBCs on day #4 secondary to hemoglobin of and 7.5 and patient being hypertensive, dizzy and tachycardia. Internal medicine was also initiated on day 1 due to patient having a hypotensive episode and became unresponsive. Medical workup did not reveal any problems other than the hemoglobin.   He was given perioperative antibiotics:  Anti-infectives    Start     Dose/Rate Route Frequency Ordered Stop   02/08/16 1730  ceFAZolin (ANCEF) IVPB 2g/100 mL premix     2 g 200 mL/hr over 30 Minutes Intravenous Every 6 hours 02/08/16 1604 02/09/16 1212   02/08/16 0932  ceFAZolin (ANCEF) 2-4 GM/100ML-% IVPB    Comments:  STOLLEY, LORI: cabinet override      02/08/16 0932 02/08/16 1145   02/08/16 0459  ceFAZolin (ANCEF) IVPB 2g/100 mL premix  Status:  Discontinued     2 g 200 mL/hr over 30 Minutes Intravenous On call to O.R. 02/08/16 [id number] 02/08/16 0959    .  He was Fitted with AV 1 compression foot pump devices, instructed on heel pumps, early ambulation, and fitted with TED stockings bilaterally for DVT prophylaxis.  He benefited maximally from the hospital stay and there were no complications.    Recent vital signs:  Filed Vitals:   02/14/16 0420 02/14/16 0736  BP: 131/63 120/65  Pulse: 97 94  Temp: 98.1 F (36.7 C) 98.3 F (36.8 C)  Resp: 20 20    Recent laboratory studies:  Lab Results  Component Value Date   HGB 8.6* 02/14/2016   HGB 7.5* 02/13/2016   HGB 7.7* 02/12/2016   Lab Results  Component Value Date   WBC 5.6 02/13/2016   PLT 182 02/13/2016   Lab Results  Component Value Date   INR 1.14 01/25/2016   Lab Results  Component Value Date   NA  132* 02/13/2016   K 3.5 02/13/2016   CL 98* 02/13/2016   CO2 29 02/13/2016   BUN 14 02/13/2016   CREATININE 0.80 02/13/2016   GLUCOSE 113* 02/13/2016    Discharge Medications:     Medication List    TAKE these medications        acetaminophen 500 MG tablet  Commonly known as:  TYLENOL  Take 500 mg by mouth every 6 (six) hours as needed.     Aspirin 81 MG EC tablet  Take 81 mg by mouth daily.     citalopram 20 MG tablet  Commonly known as:  CELEXA  TAKE 1 TABLET (20 MG TOTAL) BY MOUTH DAILY.     clopidogrel 75 MG tablet  Commonly known as:  PLAVIX  TAKE 1 TABLET BY MOUTH DAILY     CRESTOR 20 MG tablet  Generic drug:  rosuvastatin  TAKE 1 TABLET (20 MG TOTAL) BY MOUTH NIGHTLY.     enoxaparin 40 MG/0.4ML injection  Commonly known as:  LOVENOX  Inject 0.4 mLs (40 mg total) into the skin every 12 (twelve) hours.     enoxaparin 40 MG/0.4ML injection  Commonly known as:  LOVENOX  Inject 0.4 mLs (40 mg total) into the skin daily.     fexofenadine 180 MG tablet  Commonly known as:  ALLEGRA  Take 180 mg by mouth daily. As needed     levothyroxine 112 MCG tablet  Commonly known as:  SYNTHROID, LEVOTHROID  TAKE 1 TABLET (112 MCG TOTAL) BY MOUTH DAILY.     lisinopril 40 MG tablet  Commonly known as:  PRINIVIL,ZESTRIL  TAKE 1 TABLET (40 MG TOTAL) BY MOUTH DAILY.     LORazepam 1 MG tablet  Commonly known as:  ATIVAN  TAKE 1 TABLET BY MOUTH AT BEDTIME AS NEEDED FOR ANXIETY     metoprolol tartrate 25 MG tablet  Commonly known as:  LOPRESSOR  TAKE 1 TABLET BY MOUTH TWICE A DAY     naproxen sodium 220 MG tablet  Commonly known as:  ANAPROX  Take 220 mg by mouth as needed (knee pain).     nitroGLYCERIN 0.4 MG SL tablet  Commonly known as:  NITROSTAT  Place 0.4 mg under the tongue every 5 (five) minutes as needed. If a third tablet is needed please call 911.     oxyCODONE 5 MG immediate release tablet  Commonly known as:  Oxy IR/ROXICODONE  Take 1-2 tablets (5-10 mg  total) by mouth every 4 (four) hours as needed for severe pain or breakthrough pain (If the patient is hypotensive, hold any oxycodone.).     pantoprazole 40 MG tablet  Commonly known as:  PROTONIX  TAKE 1 TABLET (40 MG TOTAL) BY MOUTH DAILY.     traMADol 50 MG tablet  Commonly known as:  ULTRAM  Take 1-2 tablets (50-100 mg total) by mouth every 4 (four) hours as needed  for moderate pain.        Diagnostic Studies: Dg Knee Right Port  02/08/2016  CLINICAL DATA:  Status post right knee replacement EXAM: PORTABLE RIGHT KNEE - 1-2 VIEW COMPARISON:  None. FINDINGS: Right knee prosthesis is seen. No acute bony abnormality is noted. Surgical drains are noted in place. IMPRESSION: No acute abnormality noted. Electronically Signed   By: Inez Catalina M.D.   On: 02/08/2016 15:24    Disposition: 01-Home or Self Care      Discharge Instructions    Diet - low sodium heart healthy    Complete by:  As directed      Increase activity slowly    Complete by:  As directed            Follow-up Information    Follow up with [facility] R[facility] PA On 02/23/2016.   Specialty:  Physician Assistant   Why:  at 9:15am   Contact information:   [address] [facility] [place] [address] [phone]       Follow up with Dereck Leep, MD On 03/22/2016.   Specialty:  Orthopedic Surgery   Why:  at 9:45am   Contact information:   [place] [address] [phone]       Follow up with [facility] .   Specialties:  [place], [place]   Contact information:   [address] [place]       Signed: Watt Climes. 02/14/2016, 8:03 AM

## 2016-02-14 NOTE — Progress Notes (Signed)
Physical Therapy Treatment Patient Details Name: Daniel Austin MRN: [id number] DOB: 17-Aug-1941 Today's Date: 02/14/2016    History of Present Illness Pt is a 75 y.o. male s/p elective R TKA secondary to OA 02/08/16.  Pt with low BP post-op and rapid response called (pt was dizzy and went unresponsive) 02/08/16 PM.  PMH includes SOB with exertion, CVA, L sided weakness (from 2008), metal rod L foot/ankle, CAD, pacemaker, heart block.    PT Comments    Pt notes feeling better than yesterday; bleeding from surgical site has ceased. Pt to be transferred to skilled nursing facility today. Pt participates in up in bed for right knee stretching with improved knee flexion range. Supine right knee extension proves better than standing extension. Educated on/performed quad set exercises in both supine and stand. Pt tolerates stand for for several minutes. Pt does fatigue easily, but definite improvement of function today. Pt is ready and prepared for discharge when EMS arrives. Pt will discharge to skilled nursing facility for continued work on strength, stretch, bed mobility, transfers and gait to improve functional mobility.   Follow Up Recommendations  SNF     Equipment Recommendations       Recommendations for Other Services       Precautions / Restrictions Precautions Precautions: Knee;Fall Restrictions Weight Bearing Restrictions: Yes RLE Weight Bearing: Weight bearing as tolerated    Mobility  Bed Mobility Overal bed mobility: Needs Assistance Bed Mobility: Supine to Sit     Supine to sit: Min assist;HOB elevated Sit to supine: Min assist;HOB elevated   General bed mobility comments: Increased time, assist at LEs and trunk; Min A x 2 and pt use of LEs and trapeze to reposition upward in bed  Transfers Overall transfer level: Needs assistance Equipment used: Rolling walker (2 wheeled) Transfers: Sit to/from Stand Sit to Stand: Min assist         General transfer comment:  slowl to rise; decreased use of RLE; difficulty initially placing R heel on ground and continues difficulty with R knee extension  Ambulation/Gait             General Gait Details: Not tested; only stand weight shift and QS   Stairs            Wheelchair Mobility    Modified Rankin (Stroke Patients Only)       Balance Overall balance assessment: Needs assistance Sitting-balance support: Bilateral upper extremity supported;Feet supported Sitting balance-Leahy Scale: Good     Standing balance support: Bilateral upper extremity supported Standing balance-Leahy Scale: Fair                      Cognition Arousal/Alertness: Awake/alert Behavior During Therapy: WFL for tasks assessed/performed Overall Cognitive Status: Within Functional Limits for tasks assessed                      Exercises Total Joint Exercises Ankle Circles/Pumps: AROM;Both;20 reps;Supine Quad Sets: Strengthening;Both;20 reps;Supine (also in stand 10x ) Knee Flexion: AAROM;Right;10 reps;Seated (3 positions with 10 sec hold eac position) Goniometric ROM: -4 supine to 80 flex seated; -8 with standing QS and more so without QS    General Comments General comments (skin integrity, edema, etc.): portable wound vac; ace clean and dty. R distal LE swollen, tight, shiny      Pertinent Vitals/Pain Pain Assessment: 0-10 Pain Score: 3  Pain Location: R knee/shin Pain Descriptors / Indicators: Aching;Tightness Pain Intervention(s): Limited activity within patient's tolerance;Monitored during  session;Premedicated before session    Home Living                      Prior Function            PT Goals (current goals can now be found in the care plan section) Progress towards PT goals: Progressing toward goals    Frequency  BID    PT Plan Current plan remains appropriate    Co-evaluation             End of Session Equipment Utilized During Treatment: Gait  belt Activity Tolerance: Patient limited by fatigue;Patient tolerated treatment well Patient left: in bed;with call bell/phone within reach;with bed alarm set;with family/visitor present     Time: [id number] PT Time Calculation (min) (ACUTE ONLY): 34 min  Charges:  $Therapeutic Exercise: 8-22 mins $Therapeutic Activity: 8-22 mins                    G Codes:      Charlaine Dalton, PTA 02/14/2016, 11:31 AM

## 2016-02-14 NOTE — Care Management Important Message (Signed)
Important Message  Patient Details  Name: Daniel Austin MRN: [id number] Date of Birth: 11-12-1940   Medicare Important Message Given:  Yes    Daniel Austin 02/14/2016, 11:32 AM

## 2016-02-14 NOTE — Progress Notes (Signed)
Per Hospitalist patient needs to stop lisinopril. Clinical Education officer, museum (CSW) paged Ortho PA and made him aware of above. Per Ortho PA he cannot change the D/C Summary after patient has discharged and he will call [facility] and give them a verbal order to stop the lisinopril. CSW also contacted Bill admissions coordinator at [facility] and made him aware of above. Per Judithann Sheen PA did speak with staff member Crystal at [facility]. Per Bill lisinopril will be stopped.   Blima Rich, LCSW [phone]

## 2016-02-14 NOTE — Progress Notes (Signed)
[name]    MR#:  [id number]  DATE OF BIRTH:  09/04/1941  SUBJECTIVE:  CHIEF COMPLAINT:  No chief complaint on file.  Patient feels better today. Drainage from around the wound has stopped.  REVIEW OF SYSTEMS:  Review of Systems  Constitutional: Negative for fever, weight loss, malaise/fatigue and diaphoresis.  HENT: Negative for ear discharge, ear pain, hearing loss, nosebleeds, sore throat and tinnitus.   Eyes: Negative for blurred vision and pain.  Respiratory: Negative for cough, hemoptysis, shortness of breath and wheezing.   Cardiovascular: Negative for chest pain, palpitations, orthopnea and leg swelling.  Gastrointestinal: Positive for constipation. Negative for heartburn, nausea, vomiting, abdominal pain, diarrhea and blood in stool.  Genitourinary: Negative for dysuria, urgency and frequency.  Musculoskeletal: Positive for joint pain. Negative for myalgias and back pain.  Skin: Negative for itching and rash.  Neurological: Negative for dizziness, tingling, tremors, focal weakness, seizures, weakness and headaches.  Psychiatric/Behavioral: Negative for depression. The patient is not nervous/anxious.    DRUG ALLERGIES:   Allergies  Allergen Reactions  . Beta Adrenergic Blockers Other (See Comments)    Second Degree Heart Block  . Lipitor [Atorvastatin] Other (See Comments)    Myalgia Other Reaction: myalgia Myalgia    VITALS:  Blood pressure 127/59, pulse 97, temperature 98.2 F (36.8 C), temperature source Oral, resp. rate 18, height 5\' 10"  (1.778 m), weight 90.719 kg (200 lb), SpO2 100 %. PHYSICAL EXAMINATION:  Physical Exam  Constitutional: He is oriented to person, place, and time and well-developed, well-nourished, and in no distress.  HENT:  Head: Normocephalic and atraumatic.  Eyes: Conjunctivae and EOM are normal. Pupils are equal, round, and reactive to light.  Neck:  Normal range of motion. Neck supple. No tracheal deviation present. No thyromegaly present.  Cardiovascular: Normal rate, regular rhythm and normal heart sounds.   Pulmonary/Chest: Effort normal and breath sounds normal. No respiratory distress. He has no wheezes. He exhibits no tenderness.  Abdominal: Soft. Bowel sounds are normal. He exhibits no distension. There is no tenderness.  Musculoskeletal: Normal range of motion.  Neurological: He is alert and oriented to person, place, and time. No cranial nerve deficit.  Skin: Skin is warm and dry. No rash noted.  Rt Knee - wound vac in place.  Psychiatric: Mood and affect normal.   LABORATORY PANEL:   CBC  Recent Labs Lab 02/13/16 0339 02/14/16 0555  WBC 5.6  --   HGB 7.5* 8.6*  HCT 22.0*  --   PLT 182  --    ------------------------------------------------------------------------------------------------------------------ Chemistries   Recent Labs Lab 02/13/16 1546  NA 132*  K 3.5  CL 98*  CO2 29  GLUCOSE 113*  BUN 14  CREATININE 0.80  CALCIUM 7.8*   RADIOLOGY:  No results found. ASSESSMENT AND PLAN:  Daniel Austin is a 75 y.o. male with a known history of DJD, coronary artery disease, anemia, hypertension, hyperlipidemia, was admitted on the order service for elective right total knee arthroplasty.   * Acute blood loss Anemia - Hemoglobin Improved - Patient receiving 1 unit packed RBC yesterday.  * Orthostatic hypotension - stopped metoprolol, lisinopril - This seems to be a chronic problem worsened by his anemia. - We will restart metoprolol. Discontinue lisinopril.  * History of CAD - continue cardiac meds after blood pressure stable - can resume plavix per Ortho  * Hypothyroidism on Synthroid  * Hyperlipidemia on Crestor  * Rt TKA:  POD#6 - As per orthopedics  * Constipation - Likely due to narcotics - On Dulcolax, lactulose, milk of magnesia, Fleet and Senokot-s  DVT prophylaxis per ortho  All  the records are reviewed and case discussed with Care Management/Social Worker. Management plans discussed with the patient, family and they are in agreement.  CODE STATUS: Full code  TOTAL TIME TAKING CARE OF THIS PATIENT: 25 minutes.   Discharged later today   Hillary Bow R M.D on 02/14/2016 at 10:38 AM  Between 7am to 6pm - Pager - [phone]  After 6pm go to [email or web address] - password [email or web address] [facility]  Office  [phone]  CC: Primary care physician; Einar Pheasant, MD  Note: This dictation was prepared with Dragon dictation along with smaller phrase technology. Any transcriptional errors that result from this process are unintentional.

## 2016-02-14 NOTE — Clinical Social Work Placement (Signed)
   CLINICAL SOCIAL WORK PLACEMENT  NOTE  Date:  02/14/2016  Patient Details  Name: Daniel Austin MRN: [id number] Date of Birth: 08/02/1941  Clinical Social Work is seeking post-discharge placement for this patient at the [place] level of care (*CSW will initial, date and re-position this form in  chart as items are completed):  Yes   Patient/family provided with [facility] Work Department's list of facilities offering this level of care within the geographic area requested by the patient (or if unable, by the patient's family).  Yes   Patient/family informed of their freedom to choose among providers that offer the needed level of care, that participate in Medicare, Medicaid or managed care program needed by the patient, have an available bed and are willing to accept the patient.  Yes   Patient/family informed of [facility]'s ownership interest in [facility] and [facility], as well as of the fact that they are under no obligation to receive care at these facilities.  PASRR submitted to EDS on 02/09/16     PASRR number received on 02/09/16     Existing PASRR number confirmed on       FL2 transmitted to all facilities in geographic area requested by pt/family on 02/09/16     FL2 transmitted to all facilities within larger geographic area on       Patient informed that his/her managed care company has contracts with or will negotiate with certain facilities, including the following:        Yes   Patient/family informed of bed offers received.  Patient chooses bed at  [facility] )     Physician recommends and patient chooses bed at      Patient to be transferred to  [facility] ) on 02/14/16.  Patient to be transferred to facility by  [facility] )     Patient family notified on 02/14/16 of transfer.  Name of family member notified:   (Patient's daughter Loma Sousa is at bedside and aware of D/C today. )     PHYSICIAN    Additional Comment:    _______________________________________________ Loralyn Freshwater, LCSW 02/14/2016, 8:51 AM

## 2016-02-21 DIAGNOSIS — D62 Acute posthemorrhagic anemia: Secondary | ICD-10-CM | POA: Diagnosis not present

## 2016-02-21 DIAGNOSIS — Z471 Aftercare following joint replacement surgery: Secondary | ICD-10-CM | POA: Diagnosis not present

## 2016-02-21 DIAGNOSIS — Z96651 Presence of right artificial knee joint: Secondary | ICD-10-CM | POA: Diagnosis not present

## 2016-02-21 DIAGNOSIS — E039 Hypothyroidism, unspecified: Secondary | ICD-10-CM | POA: Diagnosis not present

## 2016-02-21 DIAGNOSIS — I10 Essential (primary) hypertension: Secondary | ICD-10-CM | POA: Diagnosis not present

## 2016-02-21 DIAGNOSIS — I5022 Chronic systolic (congestive) heart failure: Secondary | ICD-10-CM | POA: Diagnosis not present

## 2016-02-21 DIAGNOSIS — I2510 Atherosclerotic heart disease of native coronary artery without angina pectoris: Secondary | ICD-10-CM | POA: Diagnosis not present

## 2016-02-21 DIAGNOSIS — E119 Type 2 diabetes mellitus without complications: Secondary | ICD-10-CM | POA: Diagnosis not present

## 2016-02-23 DIAGNOSIS — Z96651 Presence of right artificial knee joint: Secondary | ICD-10-CM | POA: Diagnosis not present

## 2016-02-27 DIAGNOSIS — Z96651 Presence of right artificial knee joint: Secondary | ICD-10-CM | POA: Diagnosis not present

## 2016-02-29 DIAGNOSIS — Z96651 Presence of right artificial knee joint: Secondary | ICD-10-CM | POA: Diagnosis not present

## 2016-03-01 ENCOUNTER — Telehealth: Payer: Self-pay | Admitting: Internal Medicine

## 2016-03-01 NOTE — Telephone Encounter (Signed)
Pt wife called about pt having knee replacement he is having some depression and aniexty which started yesterday morning and now feeling like that again today. Pt wants to know if he can take  citalopram (CELEXA) 20 MG tablet more of that medication?   Call pt @ [phone]. Thank you!

## 2016-03-01 NOTE — Telephone Encounter (Signed)
The dose can be increased, but does he feel he needs to be seen.  If so, let me know - will ned to work him in.

## 2016-03-02 DIAGNOSIS — Z96651 Presence of right artificial knee joint: Secondary | ICD-10-CM | POA: Diagnosis not present

## 2016-03-02 NOTE — Telephone Encounter (Signed)
Spoke with patient and he will try increasing by half tab and call back if needed. Noted on medication list.

## 2016-03-05 DIAGNOSIS — Z96651 Presence of right artificial knee joint: Secondary | ICD-10-CM | POA: Diagnosis not present

## 2016-03-06 DIAGNOSIS — E7800 Pure hypercholesterolemia, unspecified: Secondary | ICD-10-CM | POA: Diagnosis not present

## 2016-03-06 DIAGNOSIS — I5022 Chronic systolic (congestive) heart failure: Secondary | ICD-10-CM | POA: Diagnosis not present

## 2016-03-06 DIAGNOSIS — Z9889 Other specified postprocedural states: Secondary | ICD-10-CM | POA: Diagnosis not present

## 2016-03-06 DIAGNOSIS — I2510 Atherosclerotic heart disease of native coronary artery without angina pectoris: Secondary | ICD-10-CM | POA: Diagnosis not present

## 2016-03-06 DIAGNOSIS — I6521 Occlusion and stenosis of right carotid artery: Secondary | ICD-10-CM | POA: Diagnosis not present

## 2016-03-06 DIAGNOSIS — I459 Conduction disorder, unspecified: Secondary | ICD-10-CM | POA: Diagnosis not present

## 2016-03-06 DIAGNOSIS — I10 Essential (primary) hypertension: Secondary | ICD-10-CM | POA: Diagnosis not present

## 2016-03-06 DIAGNOSIS — I6522 Occlusion and stenosis of left carotid artery: Secondary | ICD-10-CM | POA: Diagnosis not present

## 2016-03-09 ENCOUNTER — Other Ambulatory Visit: Payer: Self-pay | Admitting: Internal Medicine

## 2016-03-09 DIAGNOSIS — Z96651 Presence of right artificial knee joint: Secondary | ICD-10-CM | POA: Diagnosis not present

## 2016-03-12 DIAGNOSIS — Z96651 Presence of right artificial knee joint: Secondary | ICD-10-CM | POA: Diagnosis not present

## 2016-03-13 NOTE — Telephone Encounter (Signed)
Pt is requesting a refill. Last filled on 12/29/15 #30 + 1, last OV 12/29/15. Ok to refill?

## 2016-03-14 ENCOUNTER — Other Ambulatory Visit: Payer: Self-pay | Admitting: Internal Medicine

## 2016-03-14 DIAGNOSIS — Z96651 Presence of right artificial knee joint: Secondary | ICD-10-CM | POA: Diagnosis not present

## 2016-03-14 NOTE — Telephone Encounter (Signed)
ok'd refill for lorazepam #30 with one refill.

## 2016-03-15 ENCOUNTER — Other Ambulatory Visit: Payer: Self-pay | Admitting: Internal Medicine

## 2016-03-15 NOTE — Telephone Encounter (Signed)
Pt lvm stating he needs to talk to a nurse about a rx refill. He did not state the name but wants cb to discuss.

## 2016-03-15 NOTE — Telephone Encounter (Signed)
Rs was called in this morning & left on voicemail.

## 2016-03-15 NOTE — Telephone Encounter (Signed)
Pt called back to follow up on the medication.   Call pt @ [phone]. Thank you!

## 2016-03-16 DIAGNOSIS — Z96651 Presence of right artificial knee joint: Secondary | ICD-10-CM | POA: Diagnosis not present

## 2016-03-19 DIAGNOSIS — Z96651 Presence of right artificial knee joint: Secondary | ICD-10-CM | POA: Diagnosis not present

## 2016-03-20 ENCOUNTER — Ambulatory Visit: Payer: Medicare Other | Admitting: Sports Medicine

## 2016-03-21 DIAGNOSIS — Z96651 Presence of right artificial knee joint: Secondary | ICD-10-CM | POA: Diagnosis not present

## 2016-03-22 DIAGNOSIS — Z96651 Presence of right artificial knee joint: Secondary | ICD-10-CM | POA: Diagnosis not present

## 2016-03-23 DIAGNOSIS — Z96651 Presence of right artificial knee joint: Secondary | ICD-10-CM | POA: Diagnosis not present

## 2016-03-24 DIAGNOSIS — Z96659 Presence of unspecified artificial knee joint: Secondary | ICD-10-CM | POA: Insufficient documentation

## 2016-03-26 DIAGNOSIS — Z96651 Presence of right artificial knee joint: Secondary | ICD-10-CM | POA: Diagnosis not present

## 2016-03-30 DIAGNOSIS — Z96651 Presence of right artificial knee joint: Secondary | ICD-10-CM | POA: Diagnosis not present

## 2016-04-03 ENCOUNTER — Encounter: Payer: Self-pay | Admitting: Sports Medicine

## 2016-04-03 ENCOUNTER — Ambulatory Visit (INDEPENDENT_AMBULATORY_CARE_PROVIDER_SITE_OTHER): Payer: Medicare Other | Admitting: Sports Medicine

## 2016-04-03 DIAGNOSIS — M79676 Pain in unspecified toe(s): Secondary | ICD-10-CM

## 2016-04-03 DIAGNOSIS — B351 Tinea unguium: Secondary | ICD-10-CM

## 2016-04-03 DIAGNOSIS — M2040 Other hammer toe(s) (acquired), unspecified foot: Secondary | ICD-10-CM

## 2016-04-03 DIAGNOSIS — M21619 Bunion of unspecified foot: Secondary | ICD-10-CM

## 2016-04-03 NOTE — Progress Notes (Signed)
Patient ID: [name], male   DOB: 28-Jan-1941, 75 y.o.   MRN: [id number]  Subjective: Daniel Austin is a 75 y.o. male patient seen today in office with complaint of painful thickened and elongated toenails; unable to trim. Patient denies any changes with medical history since last visit; reports had Right TKA and is doing well. Patient has no other pedal complaints at this time.   Patient Active Problem List   Diagnosis Date Noted  . H/O total knee replacement 03/24/2016  . S/P total knee arthroplasty 02/08/2016  . Chronic systolic heart failure ([place]) 12/13/2015  . HLD (hyperlipidemia) 12/13/2015  . BP (high blood pressure) 12/13/2015  . Cardiomyopathy, ischemic 12/13/2015  . MI (mitral incompetence) 12/13/2015  . Billowing mitral valve 12/13/2015  . Nonsustained ventricular tachycardia ([place]) 12/13/2015  . Carotid stenosis 12/13/2015  . Episode of syncope 12/13/2015  . Disease of thyroid gland 12/13/2015  . Health care maintenance 09/04/2015  . Encounter for completion of form with patient 01/12/2015  . Left arm numbness 09/30/2014  . Disturbance of skin sensation 09/30/2014  . Ischemia of upper extremity 09/30/2014  . Pins and needles sensation 09/30/2014  . Unspecified visual disturbance 09/30/2014  . Paresthesia of arm 09/30/2014  . Primary osteoarthritis of both knees 09/29/2014  . History of cardiac catheterization 05/12/2014  . Change in vision 01/09/2014  . Knee pain, bilateral 01/09/2014  . Arthralgia of lower leg 01/09/2014  . Left knee pain 09/13/2013  . Hyperglycemia 11/05/2012  . Carotid artery stenosis 11/05/2012  . Carotid artery narrowing 11/05/2012  . Carotid artery obstruction 11/05/2012  . Abnormal blood sugar 11/05/2012  . Hypothyroidism 10/31/2012  . Pure hypercholesterolemia 10/31/2012  . Dysphagia, unspecified(787.20) 10/31/2012  . Coronary atherosclerosis of native coronary artery 10/31/2012  . Anemia 10/31/2012  . Essential hypertension,  benign 10/31/2012  . Absolute anemia 10/31/2012  . Benign essential HTN 10/31/2012  . Can't get food down 10/31/2012  . Adult hypothyroidism 10/31/2012   Current Outpatient Prescriptions on File Prior to Visit  Medication Sig Dispense Refill  . acetaminophen (TYLENOL) 500 MG tablet Take 500 mg by mouth every 6 (six) hours as needed.    . Aspirin 81 MG EC tablet Take 81 mg by mouth daily.    . citalopram (CELEXA) 20 MG tablet TAKE 1 TABLET (20 MG TOTAL) BY MOUTH DAILY. (Patient taking differently: TAKE 1 1/2 TABLET (20 MG TOTAL) BY MOUTH DAILY.) 30 tablet 5  . clopidogrel (PLAVIX) 75 MG tablet TAKE 1 TABLET BY MOUTH DAILY 30 tablet 5  . CRESTOR 20 MG tablet TAKE 1 TABLET (20 MG TOTAL) BY MOUTH NIGHTLY. 30 tablet 11  . enoxaparin (LOVENOX) 40 MG/0.4ML injection Inject 0.4 mLs (40 mg total) into the skin every 12 (twelve) hours. 14 Syringe 0  . enoxaparin (LOVENOX) 40 MG/0.4ML injection Inject 0.4 mLs (40 mg total) into the skin daily. 14 Syringe 0  . fexofenadine (ALLEGRA) 180 MG tablet Take 180 mg by mouth daily. As needed    . levothyroxine (SYNTHROID, LEVOTHROID) 112 MCG tablet TAKE 1 TABLET (112 MCG TOTAL) BY MOUTH DAILY. 30 tablet 5  . LORazepam (ATIVAN) 1 MG tablet TAKE 1 TABLET BY MOUTH AT BEDTIME AS NEEDED FOR ANXIETY 30 tablet 1  . metoprolol tartrate (LOPRESSOR) 25 MG tablet TAKE 1 TABLET BY MOUTH TWICE A DAY 180 tablet 3  . naproxen sodium (ANAPROX) 220 MG tablet Take 220 mg by mouth as needed (knee pain).    . nitroGLYCERIN (NITROSTAT) 0.4 MG SL tablet Place  0.4 mg under the tongue every 5 (five) minutes as needed. If a third tablet is needed please call 911.    Marland Kitchen oxyCODONE (OXY IR/ROXICODONE) 5 MG immediate release tablet Take 1-2 tablets (5-10 mg total) by mouth every 4 (four) hours as needed for severe pain or breakthrough pain (If the patient is hypotensive, hold any oxycodone.). 30 tablet 0  . pantoprazole (PROTONIX) 40 MG tablet TAKE 1 TABLET (40 MG TOTAL) BY MOUTH DAILY. 30  tablet 11  . traMADol (ULTRAM) 50 MG tablet Take 1-2 tablets (50-100 mg total) by mouth every 4 (four) hours as needed for moderate pain. 30 tablet 0   No current facility-administered medications on file prior to visit.   Allergies  Allergen Reactions  . Beta Adrenergic Blockers Other (See Comments)    Second Degree Heart Block  . Lipitor [Atorvastatin] Other (See Comments)    Myalgia Other Reaction: myalgia Myalgia    Objective: Physical Exam  General: Well developed, nourished, no acute distress, awake, alert and oriented x 3  Vascular: Dorsalis pedis artery 1/4 bilateral, Posterior tibial artery 2/4 bilateral, skin temperature warm to warm proximal to distal bilateral lower extremities, no varicosities, pedal hair present bilateral.  Neurological: Gross sensation present via light touch bilateral.   Dermatological: Skin is warm, dry, and supple bilateral, Nails 1-10 are tender, long, thick, and discolored with mild subungal debris, no webspace macerations present bilateral, no open lesions present bilateral, no callus/corns/hyperkeratotic tissue present bilateral. No signs of infection bilateral.  Musculoskeletal:Asymptomatic bunion and hammertoe boney deformities noted bilateral. Muscular strength within normal limits without pain or limitation on range of motion. No pain with calf compression bilateral.  Assessment and Plan:  Problem List Items Addressed This Visit    None    Visit Diagnoses    Dermatophytosis of nail    -  Primary    Pain of toe, unspecified laterality        Bunion        Hammer toe, unspecified laterality          -Examined patient.  -Discussed treatment options for painful mycotic nails. -Mechanically debrided and reduced mycotic nails with sterile nail nipper and dremel nail file without incident. -Recommend good supportive shoes for foot type daily.  -Patient to return in 3 months for follow up evaluation or sooner if symptoms worsen.  Landis Martins, DPM

## 2016-04-04 DIAGNOSIS — Z96651 Presence of right artificial knee joint: Secondary | ICD-10-CM | POA: Diagnosis not present

## 2016-04-30 ENCOUNTER — Other Ambulatory Visit (INDEPENDENT_AMBULATORY_CARE_PROVIDER_SITE_OTHER): Payer: Medicare Other

## 2016-04-30 DIAGNOSIS — E7800 Pure hypercholesterolemia, unspecified: Secondary | ICD-10-CM | POA: Diagnosis not present

## 2016-04-30 DIAGNOSIS — I10 Essential (primary) hypertension: Secondary | ICD-10-CM | POA: Diagnosis not present

## 2016-04-30 DIAGNOSIS — R739 Hyperglycemia, unspecified: Secondary | ICD-10-CM

## 2016-04-30 LAB — HEPATIC FUNCTION PANEL
ALBUMIN: 4.1 g/dL (ref 3.5–5.2)
ALK PHOS: 90 U/L (ref 39–117)
ALT: 6 U/L (ref 0–53)
AST: 13 U/L (ref 0–37)
BILIRUBIN DIRECT: 0.1 mg/dL (ref 0.0–0.3)
TOTAL PROTEIN: 7 g/dL (ref 6.0–8.3)
Total Bilirubin: 0.6 mg/dL (ref 0.2–1.2)

## 2016-04-30 LAB — LIPID PANEL
CHOL/HDL RATIO: 3
CHOLESTEROL: 121 mg/dL (ref 0–200)
HDL: 45.7 mg/dL (ref >39.00)
LDL Cholesterol: 58 mg/dL (ref 0–99)
NonHDL: 75.25
TRIGLYCERIDES: 86 mg/dL (ref 0.0–149.0)
VLDL: 17.2 mg/dL (ref 0.0–40.0)

## 2016-04-30 LAB — BASIC METABOLIC PANEL
BUN: 7 mg/dL (ref 6–23)
CALCIUM: 9.4 mg/dL (ref 8.4–10.5)
CO2: 28 mEq/L (ref 19–32)
CREATININE: 0.88 mg/dL (ref 0.40–1.50)
Chloride: 103 mEq/L (ref 96–112)
GFR: 89.5 mL/min (ref >60.00)
GLUCOSE: 93 mg/dL (ref 70–99)
POTASSIUM: 4.2 meq/L (ref 3.5–5.1)
Sodium: 137 mEq/L (ref 135–145)

## 2016-04-30 LAB — HEMOGLOBIN A1C: Hgb A1c MFr Bld: 5.7 % (ref 4.6–6.5)

## 2016-05-03 ENCOUNTER — Ambulatory Visit (INDEPENDENT_AMBULATORY_CARE_PROVIDER_SITE_OTHER): Payer: Medicare Other | Admitting: Internal Medicine

## 2016-05-03 ENCOUNTER — Encounter: Payer: Self-pay | Admitting: Internal Medicine

## 2016-05-03 DIAGNOSIS — R739 Hyperglycemia, unspecified: Secondary | ICD-10-CM

## 2016-05-03 DIAGNOSIS — D649 Anemia, unspecified: Secondary | ICD-10-CM | POA: Diagnosis not present

## 2016-05-03 DIAGNOSIS — E039 Hypothyroidism, unspecified: Secondary | ICD-10-CM

## 2016-05-03 DIAGNOSIS — I2510 Atherosclerotic heart disease of native coronary artery without angina pectoris: Secondary | ICD-10-CM

## 2016-05-03 DIAGNOSIS — I6529 Occlusion and stenosis of unspecified carotid artery: Secondary | ICD-10-CM | POA: Diagnosis not present

## 2016-05-03 DIAGNOSIS — I10 Essential (primary) hypertension: Secondary | ICD-10-CM | POA: Diagnosis not present

## 2016-05-03 DIAGNOSIS — Z96651 Presence of right artificial knee joint: Secondary | ICD-10-CM

## 2016-05-03 MED ORDER — PANTOPRAZOLE SODIUM 40 MG PO TBEC: DELAYED_RELEASE_TABLET | ORAL | 11 refills | Status: DC

## 2016-05-03 MED ORDER — CLOPIDOGREL BISULFATE 75 MG PO TABS: 75.0000 mg | ORAL_TABLET | Freq: Every day | ORAL | 5 refills | Status: DC

## 2016-05-03 MED ORDER — LEVOTHYROXINE SODIUM 112 MCG PO TABS: ORAL_TABLET | ORAL | 5 refills | Status: DC

## 2016-05-03 MED ORDER — CITALOPRAM HYDROBROMIDE 20 MG PO TABS: ORAL_TABLET | ORAL | 5 refills | Status: DC

## 2016-05-03 MED ORDER — LORAZEPAM 1 MG PO TABS: ORAL_TABLET | ORAL | 1 refills | Status: DC

## 2016-05-03 NOTE — Progress Notes (Signed)
Pre-visit discussion using our clinic review tool. No additional management support is needed unless otherwise documented below in the visit note.  

## 2016-05-03 NOTE — Progress Notes (Signed)
Patient ID: Daniel Austin, male   DOB: 10-18-1940, 75 y.o.   MRN: [id number]   Subjective:    Patient ID: Daniel Austin, male    DOB: [birth year], 75 y.o.   MRN: [id number]  HPI  Patient here for a scheduled follow up.  He was admitted 02/08/16 for knee surgery.  Is s/p physical therapy.  Knee is doing well.  No pain.  Still with left knee pain.  Planning for f/u with Dr Marry Guan at the end of the month.  Wants to see when able to do the other knee.  No acid reflux.  No  abdominal pain or cramping.  Bowels stable.  Some minimal depression.  States not able to do what he wants.  Overall he feels he is doing relatively well.  No suicidal ideations.  Does not feel needs any further intervention.     Past Medical History:  Diagnosis Date  . Anemia   . Arthritis   . CAD (coronary artery disease)   . Cardiomyopathy ([place])   . Carotid artery stenosis    high-grade symptomatic right sided s/p stent twice  . CHF (congestive heart failure) (H[facility])    chronic movement fluid and history of CHF he noticed in the   . Depression   . Fractures, multiple    Metal rod in left foot  . GERD (gastroesophageal reflux disease)   . Heart block   . Hypercholesterolemia   . Hyperglycemia   . Hypertension   . Hypothyroidism   . Knee pain   . Presence of permanent cardiac pacemaker   . Shortness of breath dyspnea   . Stroke [facility])    clot in neck broke off left side weakness 2008  resolved  . Syncope    second degree heart block s/p St Jude's pacemaker placement 12/08 with his history is limited with any  . Ulcer of gastric fundus    Past Surgical History:  Procedure Laterality Date  . CARDIAC CATHETERIZATION    . CORONARY ANGIOPLASTY     stents x 3  . heart stint  11/04/2014   [facility]  . HERNIA REPAIR     inguinal  . INSERT / REPLACE / REMOVE PACEMAKER    . KNEE ARTHROPLASTY Right 02/08/2016   Procedure: COMPUTER ASSISTED TOTAL KNEE ARTHROPLASTY;  Surgeon: Dereck Leep, MD;  Location: [facility]  ORS;  Service: Orthopedics;  Laterality: Right;  . PACEMAKER INSERTION  08/2007  . PACEMAKER INSERTION N/A 11/16/2015   Procedure: INSERTION PACEMAKER/ PACEMAKER CHANGE OUT;  Surgeon: Isaias Cowman, MD;  Location: [facility] ORS;  Service: Cardiovascular;  Laterality: N/A;  . PERCUTANEOUS PLACEMENT INTRAVASCULAR STENT CERVICAL CAROTID ARTERY     twice, right carotid artery   Family History  Problem Relation Age of Onset  . Heart disease Mother     CHF  . Heart disease Father     CHF  . Thyroid disease Father     hypothyroidism  . Colon polyps Father   . Diabetes Sister     diet controlled  . Thyroid disease Sister   . Thyroid disease Sister   . Colon cancer Neg Hx   . Prostate cancer Neg Hx    Social History   Social History  . Marital status: Married    Spouse name: N/A  . Number of children: 1  . Years of education: N/A   Social History Main Topics  . Smoking status: Never Smoker  . Smokeless tobacco: Never Used  . Alcohol use  No  . Drug use: No  . Sexual activity: Not Currently   Other Topics Concern  . None   Social History Narrative  . None    Outpatient Encounter Prescriptions as of 05/03/2016  Medication Sig  . Aspirin 81 MG EC tablet Take 81 mg by mouth daily.  . citalopram (CELEXA) 20 MG tablet TAKE 1 TABLET (20 MG TOTAL) BY MOUTH DAILY.  Marland Kitchen clopidogrel (PLAVIX) 75 MG tablet Take 1 tablet (75 mg total) by mouth daily.  . CRESTOR 20 MG tablet TAKE 1 TABLET (20 MG TOTAL) BY MOUTH NIGHTLY.  . fexofenadine (ALLEGRA) 180 MG tablet Take 180 mg by mouth daily. As needed  . levothyroxine (SYNTHROID, LEVOTHROID) 112 MCG tablet TAKE 1 TABLET (112 MCG TOTAL) BY MOUTH DAILY.  Marland Kitchen lisinopril (PRINIVIL,ZESTRIL) 40 MG tablet Take 40 mg by mouth daily.  Marland Kitchen LORazepam (ATIVAN) 1 MG tablet TAKE 1 TABLET BY MOUTH AT BEDTIME AS NEEDED FOR ANXIETY  . metoprolol tartrate (LOPRESSOR) 25 MG tablet TAKE 1 TABLET BY MOUTH TWICE A DAY  . naproxen sodium (ANAPROX) 220 MG tablet Take 220  mg by mouth as needed (knee pain).  . nitroGLYCERIN (NITROSTAT) 0.4 MG SL tablet Place 0.4 mg under the tongue every 5 (five) minutes as needed. If a third tablet is needed please call 911.  . pantoprazole (PROTONIX) 40 MG tablet TAKE 1 TABLET (40 MG TOTAL) BY MOUTH DAILY.  . [DISCONTINUED] citalopram (CELEXA) 20 MG tablet TAKE 1 TABLET (20 MG TOTAL) BY MOUTH DAILY. (Patient taking differently: TAKE 1 1/2 TABLET (20 MG TOTAL) BY MOUTH DAILY.)  . [DISCONTINUED] clopidogrel (PLAVIX) 75 MG tablet TAKE 1 TABLET BY MOUTH DAILY  . [DISCONTINUED] levothyroxine (SYNTHROID, LEVOTHROID) 112 MCG tablet TAKE 1 TABLET (112 MCG TOTAL) BY MOUTH DAILY.  . [DISCONTINUED] LORazepam (ATIVAN) 1 MG tablet TAKE 1 TABLET BY MOUTH AT BEDTIME AS NEEDED FOR ANXIETY  . [DISCONTINUED] pantoprazole (PROTONIX) 40 MG tablet TAKE 1 TABLET (40 MG TOTAL) BY MOUTH DAILY.  . [DISCONTINUED] acetaminophen (TYLENOL) 500 MG tablet Take 500 mg by mouth every 6 (six) hours as needed.  . [DISCONTINUED] enoxaparin (LOVENOX) 40 MG/0.4ML injection Inject 0.4 mLs (40 mg total) into the skin every 12 (twelve) hours. (Patient not taking: Reported on 05/03/2016)  . [DISCONTINUED] enoxaparin (LOVENOX) 40 MG/0.4ML injection Inject 0.4 mLs (40 mg total) into the skin daily. (Patient not taking: Reported on 05/03/2016)  . [DISCONTINUED] oxyCODONE (OXY IR/ROXICODONE) 5 MG immediate release tablet Take 1-2 tablets (5-10 mg total) by mouth every 4 (four) hours as needed for severe pain or breakthrough pain (If the patient is hypotensive, hold any oxycodone.). (Patient not taking: Reported on 05/03/2016)  . [DISCONTINUED] traMADol (ULTRAM) 50 MG tablet Take 1-2 tablets (50-100 mg total) by mouth every 4 (four) hours as needed for moderate pain. (Patient not taking: Reported on 05/03/2016)   No facility-administered encounter medications on file as of 05/03/2016.     Review of Systems  Constitutional: Negative for appetite change and unexpected weight change.    HENT: Negative for congestion and sinus pressure.   Respiratory: Negative for cough, chest tightness and shortness of breath.   Cardiovascular: Negative for chest pain, palpitations and leg swelling.  Gastrointestinal: Negative for abdominal pain, diarrhea, nausea and vomiting.  Genitourinary: Negative for difficulty urinating and dysuria.  Musculoskeletal:       Right knee doing better s/p surgery.  Left knee pain.    Skin: Negative for color change and rash.  Neurological: Negative for dizziness, light-headedness and  headaches.  Psychiatric/Behavioral: Negative for agitation, dysphoric mood and suicidal ideas.       Minimal depression as outlined.         Objective:    Physical Exam  Constitutional: He appears well-developed and well-nourished. No distress.  HENT:  Nose: Nose normal.  Mouth/Throat: Oropharynx is clear and moist.  Neck: Neck supple. No thyromegaly present.  Cardiovascular: Normal rate and regular rhythm.   Pulmonary/Chest: Effort normal and breath sounds normal. No respiratory distress.  Abdominal: Soft. Bowel sounds are normal. There is no tenderness.  Musculoskeletal: He exhibits no edema or tenderness.  Welled healed incision site.    Lymphadenopathy:    He has no cervical adenopathy.  Skin: No rash noted. No erythema.  Psychiatric: He has a normal mood and affect. His behavior is normal.    BP 130/80   Pulse 93   Temp 97.9 F (36.6 C) (Oral)   Resp 18   Ht 5' 7.5" (1.715 m)   Wt 198 lb 4 oz (89.9 kg)   SpO2 94%   BMI 30.59 kg/m  Wt Readings from Last 3 Encounters:  05/03/16 198 lb 4 oz (89.9 kg)  02/08/16 200 lb (90.7 kg)  01/25/16 200 lb (90.7 kg)     Lab Results  Component Value Date   WBC 5.6 02/13/2016   HGB 8.6 (L) 02/14/2016   HCT 22.0 (L) 02/13/2016   PLT 182 02/13/2016   GLUCOSE 93 04/30/2016   CHOL 121 04/30/2016   TRIG 86.0 04/30/2016   HDL 45.70 04/30/2016   LDLCALC 58 04/30/2016   ALT 6 04/30/2016   AST 13 04/30/2016    NA 137 04/30/2016   K 4.2 04/30/2016   CL 103 04/30/2016   CREATININE 0.88 04/30/2016   BUN 7 04/30/2016   CO2 28 04/30/2016   TSH 1.03 05/23/2015   PSA 0.33 08/26/2015   INR 1.14 01/25/2016   HGBA1C 5.7 04/30/2016   MICROALBUR 1.6 05/25/2014    No results found.     Assessment & Plan:   Problem List Items Addressed This Visit    Anemia    Had EGD and colonoscopy 2012.  Needs f/u cbc.       Carotid artery stenosis    S/p stent x 2.  Continues on aspirin and plavix.  Continue f/u with vascular surgery.        Relevant Medications   lisinopril (PRINIVIL,ZESTRIL) 40 MG tablet   Coronary atherosclerosis of native coronary artery    Followed by cardiology.  On aspirin and plavix.  Currently doing well.  Continue risk factor modification.        Relevant Medications   lisinopril (PRINIVIL,ZESTRIL) 40 MG tablet   Essential hypertension, benign    Blood pressure under good control.  Continue same medication regimen.  Follow pressures.  Follow metabolic panel.        Relevant Medications   lisinopril (PRINIVIL,ZESTRIL) 40 MG tablet   H/O total knee replacement    Has done well s/p surgery.  Continue f/u with Dr Marry Guan.       Hyperglycemia    Low carb diet and exercise.  Follow met b and a1c.        Hypothyroidism    On thyroid replacement.  Follow tsh.        Relevant Medications   levothyroxine (SYNTHROID, LEVOTHROID) 112 MCG tablet    Other Visit Diagnoses   None.      Einar Pheasant, MD

## 2016-05-06 ENCOUNTER — Encounter: Payer: Self-pay | Admitting: Internal Medicine

## 2016-05-06 NOTE — Assessment & Plan Note (Signed)
Has done well s/p surgery.  Continue f/u with Dr Marry Guan.

## 2016-05-06 NOTE — Assessment & Plan Note (Signed)
Low carb diet and exercise.  Follow met b and a1c.

## 2016-05-06 NOTE — Assessment & Plan Note (Signed)
Followed by cardiology.  On aspirin and plavix.  Currently doing well.  Continue risk factor modification.

## 2016-05-06 NOTE — Assessment & Plan Note (Signed)
S/p stent x 2.  Continues on aspirin and plavix.  Continue f/u with vascular surgery.

## 2016-05-06 NOTE — Assessment & Plan Note (Signed)
On thyroid replacement.  Follow tsh.  

## 2016-05-06 NOTE — Assessment & Plan Note (Signed)
Blood pressure under good control.  Continue same medication regimen.  Follow pressures.  Follow metabolic panel.   

## 2016-05-06 NOTE — Assessment & Plan Note (Signed)
Had EGD and colonoscopy 2012.  Needs f/u cbc.

## 2016-05-24 DIAGNOSIS — M1712 Unilateral primary osteoarthritis, left knee: Secondary | ICD-10-CM | POA: Diagnosis not present

## 2016-05-24 DIAGNOSIS — Z96651 Presence of right artificial knee joint: Secondary | ICD-10-CM | POA: Diagnosis not present

## 2016-06-11 ENCOUNTER — Encounter
Admission: RE | Admit: 2016-06-11 | Discharge: 2016-06-11 | Disposition: A | Discharge status: Home / Self Care | Payer: Medicare Other | Source: Ambulatory Visit | Attending: Orthopedic Surgery | Admitting: Orthopedic Surgery

## 2016-06-11 DIAGNOSIS — Z95828 Presence of other vascular implants and grafts: Secondary | ICD-10-CM | POA: Insufficient documentation

## 2016-06-11 DIAGNOSIS — Z950 Presence of cardiac pacemaker: Secondary | ICD-10-CM | POA: Insufficient documentation

## 2016-06-11 DIAGNOSIS — Z01818 Encounter for other preprocedural examination: Secondary | ICD-10-CM | POA: Diagnosis not present

## 2016-06-11 HISTORY — DX: Anxiety disorder, unspecified: F41.9

## 2016-06-11 LAB — COMPREHENSIVE METABOLIC PANEL
ALBUMIN: 4.1 g/dL (ref 3.5–5.0)
ALK PHOS: 85 U/L (ref 38–126)
ALT: 9 U/L — ABNORMAL LOW (ref 17–63)
ANION GAP: 6 (ref 5–15)
AST: 17 U/L (ref 15–41)
BUN: 11 mg/dL (ref 6–20)
CALCIUM: 9.2 mg/dL (ref 8.9–10.3)
CHLORIDE: 101 mmol/L (ref 101–111)
CO2: 29 mmol/L (ref 22–32)
Creatinine, Ser: 0.94 mg/dL (ref 0.61–1.24)
GFR calc non Af Amer: >60 mL/min (ref >60)
GLUCOSE: 100 mg/dL — AB (ref 65–99)
POTASSIUM: 4.6 mmol/L (ref 3.5–5.1)
SODIUM: 136 mmol/L (ref 135–145)
Total Bilirubin: 0.6 mg/dL (ref 0.3–1.2)
Total Protein: 7.1 g/dL (ref 6.5–8.1)

## 2016-06-11 LAB — TYPE AND SCREEN
ABO/RH(D): O POS
ANTIBODY SCREEN: NEGATIVE

## 2016-06-11 LAB — URINALYSIS COMPLETE WITH MICROSCOPIC (ARMC ONLY)
BILIRUBIN URINE: NEGATIVE
Bacteria, UA: NONE SEEN
GLUCOSE, UA: NEGATIVE mg/dL
Hgb urine dipstick: NEGATIVE
KETONES UR: NEGATIVE mg/dL
Leukocytes, UA: NEGATIVE
Nitrite: NEGATIVE
PH: 6 (ref 5.0–8.0)
Protein, ur: NEGATIVE mg/dL
SQUAMOUS EPITHELIAL / LPF: NONE SEEN
Specific Gravity, Urine: 1.015 (ref 1.005–1.030)

## 2016-06-11 LAB — PROTIME-INR
INR: 1.13
Prothrombin Time: 14.6 seconds (ref 11.4–15.2)

## 2016-06-11 LAB — CBC
HCT: 36.9 % — ABNORMAL LOW (ref 40.0–52.0)
HEMOGLOBIN: 12.5 g/dL — AB (ref 13.0–18.0)
MCH: 26.7 pg (ref 26.0–34.0)
MCHC: 33.8 g/dL (ref 32.0–36.0)
MCV: 79.2 fL — ABNORMAL LOW (ref 80.0–100.0)
PLATELETS: 228 10*3/uL (ref 150–440)
RBC: 4.66 MIL/uL (ref 4.40–5.90)
RDW: 15.6 % — ABNORMAL HIGH (ref 11.5–14.5)
WBC: 4.4 10*3/uL (ref 3.8–10.6)

## 2016-06-11 LAB — APTT: APTT: 38 s — AB (ref 24–36)

## 2016-06-11 LAB — SURGICAL PCR SCREEN
MRSA, PCR: NEGATIVE
STAPHYLOCOCCUS AUREUS: POSITIVE — AB

## 2016-06-11 LAB — SEDIMENTATION RATE: Sed Rate: 17 mm/hr (ref 0–20)

## 2016-06-11 NOTE — Patient Instructions (Signed)
  Your procedure is scheduled on: 06/27/16 Wed Report to Same Day Surgery 2nd floor medical mall To find out your arrival time please call [phone] between 1PM - 3PM on 06/26/16 Tues Remember: Instructions that are not followed completely may result in serious medical risk, up to and including death, or upon the discretion of your surgeon and anesthesiologist your surgery may need to be rescheduled.    _x___ 1. Do not eat food or drink liquids after midnight. No gum chewing or hard candies.     __x__ 2. No Alcohol for 24 hours before or after surgery.   __x__3. No Smoking for 24 prior to surgery.   ____  4. Bring all medications with you on the day of surgery if instructed.    __x__ 5. Notify your doctor if there is any change in your medical condition     (cold, fever, infections).     Do not wear jewelry, make-up, hairpins, clips or nail polish.  Do not wear lotions, powders, or perfumes. You may wear deodorant.  Do not shave 48 hours prior to surgery. Men may shave face and neck.  Do not bring valuables to the hospital.    [facility] is not responsible for any belongings or valuables.               Contacts, dentures or bridgework may not be worn into surgery.  Leave your suitcase in the car. After surgery it may be brought to your room.  For patients admitted to the hospital, discharge time is determined by your treatment team.   Patients discharged the day of surgery will not be allowed to drive home.    Please read over the following fact sheets that you were given:   [facility] Preparing for Surgery and or MRSA Information   _x___ Take these medicines the morning of surgery with A SIP OF WATER:    1. citalopram (CELEXA) 20 MG tablet  2.levothyroxine (SYNTHROID, LEVOTHROID) 112 MCG tablet  3.lisinopril (PRINIVIL,ZESTRIL) 40 MG tablet  4.metoprolol tartrate (LOPRESSOR) 25 MG tablet  5.pantoprazole (PROTONIX) 40 MG tablet  6.  ____ Fleet Enema (as directed)   _x___  Use CHG Soap or sage wipes as directed on instruction sheet   ____ Use inhalers on the day of surgery and bring to hospital day of surgery  ____ Stop metformin 2 days prior to surgery    ____ Take 1/2 of usual insulin dose the night before surgery and none on the morning of           surgery.   x__ Stop aspirin or coumadin, or plavix Stop aspirin 1 week before surgery/ stop Plavix 5 days before surgery if OK with cardiologist __ _x__ Stop Anti-inflammatories such as Advil, Aleve, Ibuprofen, Motrin, Naproxen,          Naprosyn, Goodies powders or aspirin products. Ok to take Tylenol.   ____ Stop supplements until after surgery.    ____ Bring C-Pap to the hospital.

## 2016-06-12 DIAGNOSIS — I420 Dilated cardiomyopathy: Secondary | ICD-10-CM | POA: Diagnosis not present

## 2016-06-12 LAB — URINE CULTURE
CULTURE: NO GROWTH
Special Requests: NORMAL

## 2016-06-18 ENCOUNTER — Other Ambulatory Visit: Payer: Self-pay

## 2016-06-18 MED ORDER — LEVOTHYROXINE SODIUM 112 MCG PO TABS: ORAL_TABLET | ORAL | 5 refills | Status: DC

## 2016-06-19 DIAGNOSIS — M25562 Pain in left knee: Secondary | ICD-10-CM | POA: Diagnosis not present

## 2016-06-26 MED ORDER — TRANEXAMIC ACID 1000 MG/10ML IV SOLN
1000.0000 mg | INTRAVENOUS | Status: DC
  Filled 2016-06-26: qty 10

## 2016-06-26 MED ORDER — CEFAZOLIN SODIUM-DEXTROSE 2-4 GM/100ML-% IV SOLN
2.0000 g | INTRAVENOUS | Status: AC
  Administered 2016-06-27: 2 g via INTRAVENOUS

## 2016-06-27 ENCOUNTER — Inpatient Hospital Stay: Payer: Medicare Other | Admitting: Anesthesiology

## 2016-06-27 ENCOUNTER — Encounter: Payer: Self-pay | Admitting: *Deleted

## 2016-06-27 ENCOUNTER — Inpatient Hospital Stay: Payer: Medicare Other

## 2016-06-27 ENCOUNTER — Encounter
Admission: RE | Disposition: A | Discharge status: Home Health | Payer: Self-pay | Source: Ambulatory Visit | Attending: Orthopedic Surgery

## 2016-06-27 ENCOUNTER — Inpatient Hospital Stay
Admission: RE | Admit: 2016-06-27 | Discharge: 2016-06-29 | DRG: 470 | Disposition: A | Discharge status: Home Health | Payer: Medicare Other | Source: Ambulatory Visit | Attending: Orthopedic Surgery | Admitting: Orthopedic Surgery

## 2016-06-27 DIAGNOSIS — Z951 Presence of aortocoronary bypass graft: Secondary | ICD-10-CM

## 2016-06-27 DIAGNOSIS — E039 Hypothyroidism, unspecified: Secondary | ICD-10-CM | POA: Diagnosis present

## 2016-06-27 DIAGNOSIS — Z8249 Family history of ischemic heart disease and other diseases of the circulatory system: Secondary | ICD-10-CM | POA: Diagnosis not present

## 2016-06-27 DIAGNOSIS — Z950 Presence of cardiac pacemaker: Secondary | ICD-10-CM

## 2016-06-27 DIAGNOSIS — Z7982 Long term (current) use of aspirin: Secondary | ICD-10-CM | POA: Diagnosis not present

## 2016-06-27 DIAGNOSIS — K219 Gastro-esophageal reflux disease without esophagitis: Secondary | ICD-10-CM | POA: Diagnosis present

## 2016-06-27 DIAGNOSIS — I5022 Chronic systolic (congestive) heart failure: Secondary | ICD-10-CM | POA: Diagnosis present

## 2016-06-27 DIAGNOSIS — Z7902 Long term (current) use of antithrombotics/antiplatelets: Secondary | ICD-10-CM

## 2016-06-27 DIAGNOSIS — Z96659 Presence of unspecified artificial knee joint: Secondary | ICD-10-CM

## 2016-06-27 DIAGNOSIS — Z471 Aftercare following joint replacement surgery: Secondary | ICD-10-CM | POA: Diagnosis not present

## 2016-06-27 DIAGNOSIS — Z8673 Personal history of transient ischemic attack (TIA), and cerebral infarction without residual deficits: Secondary | ICD-10-CM | POA: Diagnosis not present

## 2016-06-27 DIAGNOSIS — R262 Difficulty in walking, not elsewhere classified: Secondary | ICD-10-CM

## 2016-06-27 DIAGNOSIS — I110 Hypertensive heart disease with heart failure: Secondary | ICD-10-CM | POA: Diagnosis present

## 2016-06-27 DIAGNOSIS — M1712 Unilateral primary osteoarthritis, left knee: Principal | ICD-10-CM | POA: Diagnosis present

## 2016-06-27 DIAGNOSIS — I2510 Atherosclerotic heart disease of native coronary artery without angina pectoris: Secondary | ICD-10-CM | POA: Diagnosis present

## 2016-06-27 DIAGNOSIS — Z96652 Presence of left artificial knee joint: Secondary | ICD-10-CM | POA: Diagnosis not present

## 2016-06-27 DIAGNOSIS — Z79899 Other long term (current) drug therapy: Secondary | ICD-10-CM | POA: Diagnosis not present

## 2016-06-27 DIAGNOSIS — M25562 Pain in left knee: Secondary | ICD-10-CM | POA: Diagnosis not present

## 2016-06-27 DIAGNOSIS — Z888 Allergy status to other drugs, medicaments and biological substances status: Secondary | ICD-10-CM | POA: Diagnosis not present

## 2016-06-27 DIAGNOSIS — I255 Ischemic cardiomyopathy: Secondary | ICD-10-CM | POA: Diagnosis present

## 2016-06-27 DIAGNOSIS — Z823 Family history of stroke: Secondary | ICD-10-CM | POA: Diagnosis not present

## 2016-06-27 DIAGNOSIS — E7800 Pure hypercholesterolemia, unspecified: Secondary | ICD-10-CM | POA: Diagnosis present

## 2016-06-27 DIAGNOSIS — I509 Heart failure, unspecified: Secondary | ICD-10-CM | POA: Diagnosis not present

## 2016-06-27 DIAGNOSIS — F419 Anxiety disorder, unspecified: Secondary | ICD-10-CM | POA: Diagnosis present

## 2016-06-27 DIAGNOSIS — Z96651 Presence of right artificial knee joint: Secondary | ICD-10-CM | POA: Diagnosis present

## 2016-06-27 DIAGNOSIS — I739 Peripheral vascular disease, unspecified: Secondary | ICD-10-CM | POA: Diagnosis not present

## 2016-06-27 HISTORY — PX: KNEE ARTHROPLASTY: SHX992

## 2016-06-27 LAB — TYPE AND SCREEN
ABO/RH(D): O POS
Antibody Screen: NEGATIVE

## 2016-06-27 SURGERY — ARTHROPLASTY, KNEE, TOTAL, USING IMAGELESS COMPUTER-ASSISTED NAVIGATION
Anesthesia: Spinal | Site: Knee | Laterality: Left | Wound class: Clean

## 2016-06-27 MED ORDER — MIDAZOLAM HCL 5 MG/5ML IJ SOLN
INTRAMUSCULAR | Status: DC | PRN
  Administered 2016-06-27 (×2): 1 mg via INTRAVENOUS

## 2016-06-27 MED ORDER — BUPIVACAINE HCL (PF) 0.25 % IJ SOLN
INTRAMUSCULAR | Status: DC | PRN
  Administered 2016-06-27: 30 mL

## 2016-06-27 MED ORDER — SODIUM CHLORIDE FLUSH 0.9 % IV SOLN
INTRAVENOUS | Status: AC
  Filled 2016-06-27: qty 10

## 2016-06-27 MED ORDER — METOCLOPRAMIDE HCL 10 MG PO TABS
10.0000 mg | ORAL_TABLET | Freq: Three times a day (TID) | ORAL | Status: DC
  Administered 2016-06-27 – 2016-06-29 (×6): 10 mg via ORAL
  Filled 2016-06-27 (×6): qty 1

## 2016-06-27 MED ORDER — ONDANSETRON HCL 4 MG PO TABS: 4.0000 mg | ORAL_TABLET | Freq: Four times a day (QID) | ORAL | Status: DC | PRN

## 2016-06-27 MED ORDER — MENTHOL 3 MG MT LOZG
1.0000 | LOZENGE | OROMUCOSAL | Status: DC | PRN
  Filled 2016-06-27: qty 9

## 2016-06-27 MED ORDER — BUPIVACAINE LIPOSOME 1.3 % IJ SUSP
INTRAMUSCULAR | Status: AC
  Filled 2016-06-27: qty 20

## 2016-06-27 MED ORDER — CEFAZOLIN SODIUM-DEXTROSE 2-4 GM/100ML-% IV SOLN
INTRAVENOUS | Status: AC
  Filled 2016-06-27: qty 100

## 2016-06-27 MED ORDER — FLEET ENEMA 7-19 GM/118ML RE ENEM: 1.0000 | ENEMA | Freq: Once | RECTAL | Status: DC | PRN

## 2016-06-27 MED ORDER — PHENOL 1.4 % MT LIQD
1.0000 | OROMUCOSAL | Status: DC | PRN
  Filled 2016-06-27: qty 177

## 2016-06-27 MED ORDER — PROPOFOL 500 MG/50ML IV EMUL
INTRAVENOUS | Status: DC | PRN
  Administered 2016-06-27: 25 ug/kg/min via INTRAVENOUS

## 2016-06-27 MED ORDER — ALUM & MAG HYDROXIDE-SIMETH 200-200-20 MG/5ML PO SUSP: 30.0000 mL | ORAL | Status: DC | PRN

## 2016-06-27 MED ORDER — TRANEXAMIC ACID 1000 MG/10ML IV SOLN
1000.0000 mg | Freq: Once | INTRAVENOUS | Status: AC
  Administered 2016-06-27: 1000 mg via INTRAVENOUS
  Filled 2016-06-27: qty 10

## 2016-06-27 MED ORDER — NEOMYCIN-POLYMYXIN B GU 40-200000 IR SOLN
Status: DC | PRN
  Administered 2016-06-27: 14 mL

## 2016-06-27 MED ORDER — CEFAZOLIN SODIUM-DEXTROSE 2-4 GM/100ML-% IV SOLN
2.0000 g | Freq: Four times a day (QID) | INTRAVENOUS | Status: AC
  Administered 2016-06-27 – 2016-06-28 (×3): 2 g via INTRAVENOUS
  Filled 2016-06-27 (×5): qty 100

## 2016-06-27 MED ORDER — PHENYLEPHRINE HCL 10 MG/ML IJ SOLN
INTRAMUSCULAR | Status: DC | PRN
  Administered 2016-06-27: 25 ug/min via INTRAVENOUS

## 2016-06-27 MED ORDER — OXYCODONE HCL 5 MG PO TABS: 5.0000 mg | ORAL_TABLET | Freq: Once | ORAL | Status: DC | PRN

## 2016-06-27 MED ORDER — BUPIVACAINE HCL (PF) 0.5 % IJ SOLN
INTRAMUSCULAR | Status: DC | PRN
  Administered 2016-06-27: 2.5 mL

## 2016-06-27 MED ORDER — DIPHENHYDRAMINE HCL 12.5 MG/5ML PO ELIX: 12.5000 mg | ORAL_SOLUTION | ORAL | Status: DC | PRN

## 2016-06-27 MED ORDER — BUPIVACAINE HCL (PF) 0.25 % IJ SOLN
INTRAMUSCULAR | Status: AC
  Filled 2016-06-27: qty 60

## 2016-06-27 MED ORDER — CHLORHEXIDINE GLUCONATE 4 % EX LIQD: 60.0000 mL | Freq: Once | CUTANEOUS | Status: DC

## 2016-06-27 MED ORDER — NEOMYCIN-POLYMYXIN B GU 40-200000 IR SOLN
Status: AC
  Filled 2016-06-27: qty 20

## 2016-06-27 MED ORDER — ENOXAPARIN SODIUM 30 MG/0.3ML ~~LOC~~ SOLN
30.0000 mg | Freq: Two times a day (BID) | SUBCUTANEOUS | Status: DC
  Administered 2016-06-28 – 2016-06-29 (×3): 30 mg via SUBCUTANEOUS
  Filled 2016-06-27 (×3): qty 0.3

## 2016-06-27 MED ORDER — SODIUM CHLORIDE 0.9 % IV SOLN
INTRAVENOUS | Status: DC | PRN
  Administered 2016-06-27: 60 mL

## 2016-06-27 MED ORDER — ACETAMINOPHEN 10 MG/ML IV SOLN
1000.0000 mg | Freq: Four times a day (QID) | INTRAVENOUS | Status: AC
  Administered 2016-06-27 – 2016-06-28 (×3): 1000 mg via INTRAVENOUS
  Filled 2016-06-27 (×4): qty 100

## 2016-06-27 MED ORDER — LEVOTHYROXINE SODIUM 112 MCG PO TABS
112.0000 ug | ORAL_TABLET | Freq: Every day | ORAL | Status: DC
  Administered 2016-06-28 – 2016-06-29 (×2): 112 ug via ORAL
  Filled 2016-06-27 (×2): qty 1

## 2016-06-27 MED ORDER — SENNOSIDES-DOCUSATE SODIUM 8.6-50 MG PO TABS
1.0000 | ORAL_TABLET | Freq: Two times a day (BID) | ORAL | Status: DC
  Administered 2016-06-27 – 2016-06-29 (×4): 1 via ORAL
  Filled 2016-06-27 (×4): qty 1

## 2016-06-27 MED ORDER — LACTATED RINGERS IV SOLN
INTRAVENOUS | Status: DC
  Administered 2016-06-27 (×3): via INTRAVENOUS

## 2016-06-27 MED ORDER — PANTOPRAZOLE SODIUM 40 MG PO TBEC
40.0000 mg | DELAYED_RELEASE_TABLET | Freq: Two times a day (BID) | ORAL | Status: DC
  Administered 2016-06-27 – 2016-06-29 (×4): 40 mg via ORAL
  Filled 2016-06-27 (×4): qty 1

## 2016-06-27 MED ORDER — NITROGLYCERIN 0.4 MG SL SUBL: 0.4000 mg | SUBLINGUAL_TABLET | SUBLINGUAL | Status: DC | PRN

## 2016-06-27 MED ORDER — SODIUM CHLORIDE 0.9 % IV SOLN
INTRAVENOUS | Status: DC
  Administered 2016-06-27: 18:00:00 via INTRAVENOUS
  Administered 2016-06-28: 100 mL/h via INTRAVENOUS

## 2016-06-27 MED ORDER — MORPHINE SULFATE (PF) 2 MG/ML IV SOLN
2.0000 mg | INTRAVENOUS | Status: DC | PRN
  Administered 2016-06-28: 2 mg via INTRAVENOUS
  Filled 2016-06-27: qty 1

## 2016-06-27 MED ORDER — ACETAMINOPHEN 10 MG/ML IV SOLN
INTRAVENOUS | Status: AC
  Filled 2016-06-27: qty 100

## 2016-06-27 MED ORDER — OXYCODONE HCL 5 MG/5ML PO SOLN: 5.0000 mg | Freq: Once | ORAL | Status: DC | PRN

## 2016-06-27 MED ORDER — ACETAMINOPHEN 650 MG RE SUPP: 650.0000 mg | Freq: Four times a day (QID) | RECTAL | Status: DC | PRN

## 2016-06-27 MED ORDER — KETAMINE HCL 50 MG/ML IJ SOLN
INTRAMUSCULAR | Status: DC | PRN
  Administered 2016-06-27 (×2): 25 mg via INTRAMUSCULAR

## 2016-06-27 MED ORDER — ONDANSETRON HCL 4 MG/2ML IJ SOLN
4.0000 mg | Freq: Four times a day (QID) | INTRAMUSCULAR | Status: DC | PRN
  Administered 2016-06-27 – 2016-06-28 (×3): 4 mg via INTRAVENOUS
  Filled 2016-06-27 (×4): qty 2

## 2016-06-27 MED ORDER — FENTANYL CITRATE (PF) 100 MCG/2ML IJ SOLN: 25.0000 ug | INTRAMUSCULAR | Status: DC | PRN

## 2016-06-27 MED ORDER — TRAMADOL HCL 50 MG PO TABS
50.0000 mg | ORAL_TABLET | ORAL | Status: DC | PRN
  Administered 2016-06-27: 50 mg via ORAL
  Administered 2016-06-27: 100 mg via ORAL
  Administered 2016-06-28 – 2016-06-29 (×4): 50 mg via ORAL
  Administered 2016-06-29: 100 mg via ORAL
  Filled 2016-06-27: qty 1
  Filled 2016-06-27: qty 2
  Filled 2016-06-27: qty 1
  Filled 2016-06-27: qty 2
  Filled 2016-06-27 (×3): qty 1

## 2016-06-27 MED ORDER — ACETAMINOPHEN 325 MG PO TABS
650.0000 mg | ORAL_TABLET | Freq: Four times a day (QID) | ORAL | Status: DC | PRN
  Filled 2016-06-27: qty 2

## 2016-06-27 MED ORDER — CITALOPRAM HYDROBROMIDE 20 MG PO TABS
20.0000 mg | ORAL_TABLET | Freq: Every day | ORAL | Status: DC
  Administered 2016-06-28 – 2016-06-29 (×2): 20 mg via ORAL
  Filled 2016-06-27 (×2): qty 1

## 2016-06-27 MED ORDER — ROSUVASTATIN CALCIUM 20 MG PO TABS
20.0000 mg | ORAL_TABLET | Freq: Every evening | ORAL | Status: DC
  Administered 2016-06-27 – 2016-06-28 (×2): 20 mg via ORAL
  Filled 2016-06-27 (×2): qty 1

## 2016-06-27 MED ORDER — LORATADINE 10 MG PO TABS
10.0000 mg | ORAL_TABLET | Freq: Every day | ORAL | Status: DC
  Administered 2016-06-28 – 2016-06-29 (×2): 10 mg via ORAL
  Filled 2016-06-27 (×3): qty 1

## 2016-06-27 MED ORDER — LISINOPRIL 20 MG PO TABS
40.0000 mg | ORAL_TABLET | Freq: Every day | ORAL | Status: DC
  Administered 2016-06-28 – 2016-06-29 (×2): 40 mg via ORAL
  Filled 2016-06-27 (×2): qty 2

## 2016-06-27 MED ORDER — BISACODYL 10 MG RE SUPP
10.0000 mg | Freq: Every day | RECTAL | Status: DC | PRN
  Administered 2016-06-29: 10 mg via RECTAL
  Filled 2016-06-27: qty 1

## 2016-06-27 MED ORDER — SODIUM CHLORIDE 0.9 % IJ SOLN
INTRAMUSCULAR | Status: AC
  Filled 2016-06-27: qty 50

## 2016-06-27 MED ORDER — FERROUS SULFATE 325 (65 FE) MG PO TABS
325.0000 mg | ORAL_TABLET | Freq: Two times a day (BID) | ORAL | Status: DC
  Administered 2016-06-27 – 2016-06-29 (×4): 325 mg via ORAL
  Filled 2016-06-27 (×4): qty 1

## 2016-06-27 MED ORDER — TETRACAINE HCL 1 % IJ SOLN
INTRAMUSCULAR | Status: DC | PRN
  Administered 2016-06-27: 5 mg via INTRASPINAL

## 2016-06-27 MED ORDER — TRANEXAMIC ACID 1000 MG/10ML IV SOLN
INTRAVENOUS | Status: DC | PRN
  Administered 2016-06-27: 1000 mg via INTRAVENOUS

## 2016-06-27 MED ORDER — OXYCODONE HCL 5 MG PO TABS
5.0000 mg | ORAL_TABLET | ORAL | Status: DC | PRN
  Administered 2016-06-27: 5 mg via ORAL
  Administered 2016-06-28: 10 mg via ORAL
  Administered 2016-06-28: 5 mg via ORAL
  Filled 2016-06-27: qty 1
  Filled 2016-06-27: qty 2
  Filled 2016-06-27 (×2): qty 1

## 2016-06-27 MED ORDER — METOPROLOL TARTRATE 25 MG PO TABS
25.0000 mg | ORAL_TABLET | Freq: Two times a day (BID) | ORAL | Status: DC
  Administered 2016-06-27 – 2016-06-29 (×4): 25 mg via ORAL
  Filled 2016-06-27 (×4): qty 1

## 2016-06-27 MED ORDER — MAGNESIUM HYDROXIDE 400 MG/5ML PO SUSP
30.0000 mL | Freq: Every day | ORAL | Status: DC | PRN
  Administered 2016-06-28 – 2016-06-29 (×2): 30 mL via ORAL
  Filled 2016-06-27 (×2): qty 30

## 2016-06-27 MED ORDER — LORAZEPAM 1 MG PO TABS
1.0000 mg | ORAL_TABLET | Freq: Every evening | ORAL | Status: DC | PRN
  Administered 2016-06-27 – 2016-06-28 (×2): 1 mg via ORAL
  Filled 2016-06-27 (×2): qty 1

## 2016-06-27 SURGICAL SUPPLY — 58 items
AUTOTRANSFUS HAS 1/8 (MISCELLANEOUS) ×3
BATTERY INSTRU NAVIGATION (MISCELLANEOUS) ×12 IMPLANT
BLADE SAW 1 (BLADE) ×3 IMPLANT
BLADE SAW 1/2 (BLADE) ×3 IMPLANT
CANISTER SUCT 1200ML W/VALVE (MISCELLANEOUS) ×3 IMPLANT
CANISTER SUCT 3000ML (MISCELLANEOUS) ×6 IMPLANT
CAPT KNEE TOTAL 3 ATTUNE ×3 IMPLANT
CATH TRAY METER 16FR LF (MISCELLANEOUS) ×3 IMPLANT
CEMENT HV SMART SET (Cement) ×6 IMPLANT
COOLER POLAR GLACIER W/PUMP (MISCELLANEOUS) ×3 IMPLANT
CUFF TOURN 24 STER (MISCELLANEOUS) IMPLANT
CUFF TOURN 30 STER DUAL PORT (MISCELLANEOUS) ×3 IMPLANT
DRAPE SHEET LG 3/4 BI-LAMINATE (DRAPES) ×3 IMPLANT
DRSG DERMACEA 8X12 NADH (GAUZE/BANDAGES/DRESSINGS) ×3 IMPLANT
DRSG OPSITE POSTOP 4X14 (GAUZE/BANDAGES/DRESSINGS) ×3 IMPLANT
DRSG TEGADERM 4X4.75 (GAUZE/BANDAGES/DRESSINGS) ×3 IMPLANT
DURAPREP 26ML APPLICATOR (WOUND CARE) ×6 IMPLANT
ELECT CAUTERY BLADE 6.4 (BLADE) ×3 IMPLANT
ELECT REM PT RETURN 9FT ADLT (ELECTROSURGICAL) ×3
ELECTRODE REM PT RTRN 9FT ADLT (ELECTROSURGICAL) ×1 IMPLANT
EX-PIN ORTHOLOCK NAV 4X150 (PIN) ×6 IMPLANT
GLOVE BIOGEL M STRL SZ7.5 (GLOVE) ×6 IMPLANT
GLOVE INDICATOR 8.0 STRL GRN (GLOVE) ×3 IMPLANT
GLOVE SURG 9.0 ORTHO LTXF (GLOVE) ×3 IMPLANT
GLOVE SURG ORTHO 9.0 STRL STRW (GLOVE) ×3 IMPLANT
GOWN STRL REUS W/ TWL LRG LVL3 (GOWN DISPOSABLE) ×2 IMPLANT
GOWN STRL REUS W/TWL 2XL LVL3 (GOWN DISPOSABLE) ×3 IMPLANT
GOWN STRL REUS W/TWL LRG LVL3 (GOWN DISPOSABLE) ×4
HANDPIECE INTERPULSE COAX TIP (DISPOSABLE) ×2
HOLDER FOLEY CATH W/STRAP (MISCELLANEOUS) ×3 IMPLANT
HOOD PEEL AWAY FLYTE STAYCOOL (MISCELLANEOUS) ×6 IMPLANT
KIT RM TURNOVER STRD PROC AR (KITS) ×3 IMPLANT
KNIFE SCULPS 14X20 (INSTRUMENTS) ×3 IMPLANT
LABEL OR SOLS (LABEL) ×3 IMPLANT
NDL SAFETY 18GX1.5 (NEEDLE) ×3 IMPLANT
NEEDLE SPNL 20GX3.5 QUINCKE YW (NEEDLE) ×3 IMPLANT
NS IRRIG 500ML POUR BTL (IV SOLUTION) ×3 IMPLANT
PACK TOTAL KNEE (MISCELLANEOUS) ×3 IMPLANT
PAD WRAPON POLAR KNEE (MISCELLANEOUS) ×1 IMPLANT
PIN FIXATION 1/8DIA X 3INL (PIN) ×3 IMPLANT
SET HNDPC FAN SPRY TIP SCT (DISPOSABLE) ×1 IMPLANT
SOL .9 NS 3000ML IRR  AL (IV SOLUTION) ×2
SOL .9 NS 3000ML IRR UROMATIC (IV SOLUTION) ×1 IMPLANT
SOL PREP PVP 2OZ (MISCELLANEOUS) ×3
SOLUTION PREP PVP 2OZ (MISCELLANEOUS) ×1 IMPLANT
SPONGE DRAIN TRACH 4X4 STRL 2S (GAUZE/BANDAGES/DRESSINGS) ×3 IMPLANT
STAPLER SKIN PROX 35W (STAPLE) ×3 IMPLANT
SUCTION FRAZIER HANDLE 10FR (MISCELLANEOUS) ×2
SUCTION TUBE FRAZIER 10FR DISP (MISCELLANEOUS) ×1 IMPLANT
SUT VIC AB 0 CT1 36 (SUTURE) ×3 IMPLANT
SUT VIC AB 1 CT1 36 (SUTURE) ×6 IMPLANT
SUT VIC AB 2-0 CT2 27 (SUTURE) ×3 IMPLANT
SYR 20CC LL (SYRINGE) ×3 IMPLANT
SYR 30ML LL (SYRINGE) ×6 IMPLANT
SYSTEM AUTOTRANSFUS DUAL TROCR (MISCELLANEOUS) ×1 IMPLANT
TOWEL OR 17X26 4PK STRL BLUE (TOWEL DISPOSABLE) ×3 IMPLANT
TOWER CARTRIDGE SMART MIX (DISPOSABLE) ×3 IMPLANT
WRAPON POLAR PAD KNEE (MISCELLANEOUS) ×3

## 2016-06-27 NOTE — Brief Op Note (Signed)
06/27/2016  4:17 PM  PATIENT:  Daniel Austin  75 y.o. male  PRE-OPERATIVE DIAGNOSIS:  Primary ostoearthritis of left knee  POST-OPERATIVE DIAGNOSIS:  Primary ostoearthritis of left knee  PROCEDURE:  Procedure(s): COMPUTER ASSISTED TOTAL KNEE ARTHROPLASTY (Left)  SURGEON:  Surgeon(s) and Role:    * Dereck Leep, MD - Primary  ASSISTANTS: Vance Peper, PA   ANESTHESIA:   spinal  EBL:  Total I/O In: 1200 [I.V.:1200] Out: 350 [Urine:250; Blood:100]  BLOOD ADMINISTERED:none  DRAINS: 2 medium drains to a reinfusion system   LOCAL MEDICATIONS USED:  MARCAINE    and OTHER Marcaine  SPECIMEN:  No Specimen  DISPOSITION OF SPECIMEN:  N/A  COUNTS:  YES  TOURNIQUET:  92 minutes  DICTATION: .Dragon Dictation  PLAN OF CARE: Admit to inpatient   PATIENT DISPOSITION:  PACU - hemodynamically stable.   Delay start of Pharmacological VTE agent (>24hrs) due to surgical blood loss or risk of bleeding: yes

## 2016-06-27 NOTE — H&P (Signed)
The patient has been re-examined, and the chart reviewed, and there have been no interval changes to the documented history and physical.    The risks, benefits, and alternatives have been discussed at length. The patient expressed understanding of the risks benefits and agreed with plans for surgical intervention.  Sherrill Mckamie P. Quinlee Sciarra, Jr. M.D.    

## 2016-06-27 NOTE — Anesthesia Procedure Notes (Signed)
Spinal  Patient location during procedure: OR Start time: 06/27/2016 12:30 PM End time: 06/27/2016 12:35 PM Staffing Anesthesiologist: Alvin Critchley Performed: anesthesiologist  Preanesthetic Checklist Completed: patient identified, site marked, surgical consent, pre-op evaluation, timeout performed, IV checked, risks and benefits discussed and monitors and equipment checked Spinal Block Patient position: sitting Prep: Betadine and site prepped and draped Patient monitoring: heart rate, cardiac monitor, continuous pulse ox and blood pressure Approach: right paramedian Location: L3-4 Injection technique: single-shot Needle Needle type: Quincke  Needle gauge: 25 G Assessment Sensory level: T8 Additional Notes Time out called.  Patient placed in sitting position.  Back prepped and draped in sterile fashion.  A skin wheal was made with 1% Lidocaine plain in the L3-L4 interspace in the paramedian position.  A 25 G Quincke needle was used  In the right paramedian position with the return of clear, colorless CSF in all 4 quadrants.  12.5 mg of Marcaine + 5 mg of tetracaine + epi wash.  Patient tolerated the procedure well

## 2016-06-27 NOTE — Transfer of Care (Signed)
Immediate Anesthesia Transfer of Care Note  Patient: Daniel Austin  Procedure(s) Performed: Procedure(s): COMPUTER ASSISTED TOTAL KNEE ARTHROPLASTY (Left)  Patient Location: PACU  Anesthesia Type:Spinal  Level of Consciousness: awake, alert  and oriented  Airway & Oxygen Therapy: Patient Spontanous Breathing and Patient connected to face mask oxygen  Post-op Assessment: Report given to RN and Post -op Vital signs reviewed and stable  Post vital signs: Reviewed and stable  Last Vitals:  Vitals:   06/27/16 1025  BP: (!) 145/88  Pulse: 72  Resp: 20  Temp: 36.6 C    Last Pain:  Vitals:   06/27/16 1025  TempSrc: Oral      Patients Stated Pain Goal: 3 ([id number])  Complications: No apparent anesthesia complications

## 2016-06-27 NOTE — Anesthesia Procedure Notes (Signed)
Date/Time: 06/27/2016 1:14 PM Performed by: Nelda Marseille Pre-anesthesia Checklist: Patient identified, Emergency Drugs available, Suction available, Patient being monitored and Timeout performed Oxygen Delivery Method: Simple face mask

## 2016-06-27 NOTE — Anesthesia Preprocedure Evaluation (Signed)
Anesthesia Evaluation  Patient identified by MRN, date of birth, ID band Patient awake    Reviewed: Allergy & Precautions, H&P , NPO status , Patient's Chart, lab work & pertinent test results  History of Anesthesia Complications Negative for: history of anesthetic complications  Airway Mallampati: II  TM Distance: >3 FB Neck ROM: limited    Dental  (+) Poor Dentition, Chipped   Pulmonary shortness of breath and with exertion,    Pulmonary exam normal breath sounds clear to auscultation       Cardiovascular Exercise Tolerance: Good hypertension, (-) angina+ CAD, + Past MI, + Cardiac Stents, + Peripheral Vascular Disease, +CHF and + DOE  Normal cardiovascular exam+ dysrhythmias + pacemaker II+ Valvular Problems/Murmurs MR  Rhythm:regular Rate:Normal     Neuro/Psych PSYCHIATRIC DISORDERS Anxiety Depression CVA, Residual Symptoms    GI/Hepatic Neg liver ROS, PUD, GERD  Controlled,  Endo/Other  Hypothyroidism   Renal/GU negative Renal ROS  negative genitourinary   Musculoskeletal  (+) Arthritis ,   Abdominal   Peds  Hematology negative hematology ROS (+)   Anesthesia Other Findings Past Medical History:   Hypercholesterolemia                                         Hypothyroidism                                               Hypertension                                                 Fractures, multiple                                            Comment:Metal rod in left foot   CAD (coronary artery disease)                                Carotid artery stenosis                                        Comment:high-grade symptomatic right sided s/p stent               twice   Syncope                                                        Comment:second degree heart block s/p St Jude's               pacemaker placement 12/08   Anemia  Ulcer of gastric fundus                                       Hyperglycemia                                                Heart block                                                  Shortness of breath dyspnea                                  GERD (gastroesophageal reflux disease)                       Arthritis                                                    Knee pain                                                    Presence of permanent cardiac pacemaker                      Depression                                                   CHF (congestive heart failure) ([place])                           Comment:chronic   Cardiomyopathy ([place])                                         Stroke ([place])                                                   Comment:clot in neck broke off left side weakness 2008               resolved  Past Surgical History:   PACEMAKER INSERTION                              08/2007      HERNIA REPAIR  Comment:inguinal   PERCUTANEOUS PLACEMENT INTRAVASCULAR STENT CER*                 Comment:twice, right carotid artery   heart stint                                      11/04/2014      Comment:[facility]   PACEMAKER INSERTION                             N/A 11/16/2015      Comment:Procedure: INSERTION PACEMAKER/ PACEMAKER               CHANGE OUT;  Surgeon: Isaias Cowman, MD;               Location: [facility] ORS;  Service: Cardiovascular;                Laterality: N/A;   INSERT / REPLACE / [place]                                            Comment:stents x 3  BMI    Body Mass Index   28.69 kg/m 2      Reproductive/Obstetrics negative OB ROS                             Anesthesia Physical  Anesthesia Plan  ASA: IV  Anesthesia Plan: Spinal   Post-op Pain Management:    Induction:    Airway Management Planned:   Additional Equipment:   Intra-op Plan: Utilization Of Total Body Hypothermia per surgeon request  Post-operative Plan:   Informed Consent: I have reviewed the patients History and Physical, chart, labs and discussed the procedure including the risks, benefits and alternatives for the proposed anesthesia with the patient or authorized representative who has indicated his/her understanding and acceptance.   Dental Advisory Given  Plan Discussed with: Anesthesiologist, CRNA and Surgeon  Anesthesia Plan Comments: (Patient reports that he has not taken any plavix in the last 7 days. EF from last ECHO (2016) was read as 45% with mild MR per cardiology clearance note for this procedure. Patient tolerated spinal in May so plan to use spinal as primary anesthetic again. Patient informed that they are higher risk for complications from anesthesia during this procedure due to their medical history.  Patient voiced understanding. )        Anesthesia Quick Evaluation

## 2016-06-27 NOTE — Discharge Summary (Signed)
Physician Discharge Summary  Patient ID: Daniel Austin MRN: [id number] DOB/AGE: November 10, 1940 75 y.o.  Admit date: 06/27/2016 Discharge date: 06/29/2016  Admission Diagnoses:  Primary ostoearthritis of left knee   Discharge Diagnoses: Patient Active Problem List   Diagnosis Date Noted  . H/O total knee replacement 03/24/2016  . S/P total knee arthroplasty 02/08/2016  . Chronic systolic heart failure ([place]) 12/13/2015  . HLD (hyperlipidemia) 12/13/2015  . BP (high blood pressure) 12/13/2015  . Cardiomyopathy, ischemic 12/13/2015  . MI (mitral incompetence) 12/13/2015  . Billowing mitral valve 12/13/2015  . Nonsustained ventricular tachycardia ([place]) 12/13/2015  . Carotid stenosis 12/13/2015  . Episode of syncope 12/13/2015  . Disease of thyroid gland 12/13/2015  . Health care maintenance 09/04/2015  . Encounter for completion of form with patient 01/12/2015  . Left arm numbness 09/30/2014  . Disturbance of skin sensation 09/30/2014  . Ischemia of upper extremity 09/30/2014  . Pins and needles sensation 09/30/2014  . Unspecified visual disturbance 09/30/2014  . Paresthesia of arm 09/30/2014  . Primary osteoarthritis of both knees 09/29/2014  . History of cardiac catheterization 05/12/2014  . Change in vision 01/09/2014  . Knee pain, bilateral 01/09/2014  . Arthralgia of lower leg 01/09/2014  . Left knee pain 09/13/2013  . Hyperglycemia 11/05/2012  . Carotid artery stenosis 11/05/2012  . Carotid artery narrowing 11/05/2012  . Carotid artery obstruction 11/05/2012  . Abnormal blood sugar 11/05/2012  . Hypothyroidism 10/31/2012  . Pure hypercholesterolemia 10/31/2012  . Dysphagia, unspecified(787.20) 10/31/2012  . Coronary atherosclerosis of native coronary artery 10/31/2012  . Anemia 10/31/2012  . Essential hypertension, benign 10/31/2012  . Absolute anemia 10/31/2012  . Benign essential HTN 10/31/2012  . Can't get food down 10/31/2012  . Adult hypothyroidism  10/31/2012    Past Medical History:  Diagnosis Date  . Anemia   . Anxiety   . Arthritis   . CAD (coronary artery disease)   . Cardiomyopathy ([place])   . Carotid artery stenosis    high-grade symptomatic right sided s/p stent twice  . CHF (congestive heart failure) (H[facility])    chronic movement fluid and history of CHF he noticed in the   . Depression   . Fractures, multiple    Metal rod in left foot  . GERD (gastroesophageal reflux disease)   . Heart block   . Hypercholesterolemia   . Hyperglycemia   . Hypertension   . Hypothyroidism   . Knee pain   . Presence of permanent cardiac pacemaker   . Shortness of breath dyspnea   . Stroke [facility])    clot in neck broke off left side weakness 2008  resolved  . Syncope    second degree heart block s/p St Jude's pacemaker placement 12/08 with his history is limited with any  . Ulcer of gastric fundus      Transfusion: Autotransfusion given the first 6 hrs. Post op   Consultants (if any): none  Discharged Condition: Improved  Hospital Course: [name] is an 75 y.o. male who was admitted 06/27/2016 with a diagnosis of degenerative arthrosis of left kne and went to the operating room on 06/27/2016 and underwent the above named procedures.    Surgeries:Procedure(s): COMPUTER ASSISTED TOTAL KNEE ARTHROPLASTY on 06/27/2016  PRE-OPERATIVE DIAGNOSIS: Degenerative arthrosis of the left knee, primary  POST-OPERATIVE DIAGNOSIS:  Same  PROCEDURE:  Left total knee arthroplasty  SURGEON:  Marciano Sequin. M.D.  ASSISTANT:  Vance Peper, PA (present and scrubbed throughout the case, critical for assistance with  exposure, retraction, instrumentation, and closure)  ANESTHESIA: spinal  ESTIMATED BLOOD LOSS: 100 mL  FLUIDS REPLACED: 1200 mL of crystalloid  TOURNIQUET TIME: 92 minutes  DRAINS: 2 medium drains to a reinfusion system  SOFT TISSUE RELEASES: Anterior cruciate ligament, posterior cruciate ligament, deep medial  collateral ligament, patellofemoral ligament  IMPLANTS UTILIZED: DePuy Attune size 8 posterior stabilized femoral component (cemented), size 8 rotating platform tibial component (cemented), 41 mm medialized dome patella (cemented), and a 8 mm stabilized rotating platform polyethylene insert.  INDICATIONS FOR SURGERY: ZAIDEN Austin is a 75 y.o. year old male with a long history of progressive knee pain. X-rays demonstrated severe degenerative changes in tricompartmental fashion. The patient had not seen any significant improvement despite conservative nonsurgical intervention. After discussion of the risks and benefits of surgical intervention, the patient expressed understanding of the risks benefits and agree with plans for total knee arthroplasty.   The risks, benefits, and alternatives were discussed at length including but not limited to the risks of infection, bleeding, nerve injury, stiffness, blood clots, the need for revision surgery, cardiopulmonary complications, among others, and they were willing to proceed   Patient tolerated the surgery well. No complications .Patient was taken to PACU where she was stabilized and then transferred to the orthopedic floor.  Patient started on Lovenox 30mg  q 12 hrs. Foot pumps applied bilaterally at 80 mm hgb. Heels elevated off bed with rolled towels. No evidence of DVT. Calves non tender. Negative [place]. Physical therapy started on day #1 for gait training and transfer with OT starting on  day #1 for ADL and assisted devices. Patient has done well with therapy. Ambulated greater than 200 feet upon being discharged.  Patient's IV and foley were d/c on day one and hemovac was d/c on day #2.   He was given perioperative antibiotics:  .  He was fitted with AV 1 compression foot pump devices, instructed on heel pumps, early ambulation, and TED stocking bilaterally  for DVT prophylaxis.  He benefited maximally from the hospital stay and there were  no complications.    Recent vital signs:  Vitals:   06/27/16 1832 06/27/16 1947  BP: 128/76 117/63  Pulse: 60 61  Resp: 14 18  Temp: 97.7 F (36.5 C) 98 F (36.7 C)    Recent laboratory studies:  Lab Results  Component Value Date   HGB 12.5 (L) 06/11/2016   HGB 8.6 (L) 02/14/2016   HGB 7.5 (L) 02/13/2016   Lab Results  Component Value Date   WBC 4.4 06/11/2016   PLT 228 06/11/2016   Lab Results  Component Value Date   INR 1.13 06/11/2016   Lab Results  Component Value Date   NA 136 06/11/2016   K 4.6 06/11/2016   CL 101 06/11/2016   CO2 29 06/11/2016   BUN 11 06/11/2016   CREATININE 0.94 06/11/2016   GLUCOSE 100 (H) 06/11/2016    Discharge Medications:     Medication List    STOP taking these medications   naproxen sodium 220 MG tablet Commonly known as:  ANAPROX     TAKE these medications   acetaminophen 500 MG tablet Commonly known as:  TYLENOL Take 1,000 mg by mouth every 6 (six) hours as needed.   Aspirin 81 MG EC tablet Take 81 mg by mouth daily.   citalopram 20 MG tablet Commonly known as:  CELEXA TAKE 1 TABLET (20 MG TOTAL) BY MOUTH DAILY.   clopidogrel 75 MG tablet Commonly known as:  PLAVIX  Take 1 tablet (75 mg total) by mouth daily.   CRESTOR 20 MG tablet Generic drug:  rosuvastatin TAKE 1 TABLET (20 MG TOTAL) BY MOUTH NIGHTLY.   fexofenadine 180 MG tablet Commonly known as:  ALLEGRA Take 180 mg by mouth daily. As needed   levothyroxine 112 MCG tablet Commonly known as:  SYNTHROID, LEVOTHROID TAKE 1 TABLET (112 MCG TOTAL) BY MOUTH DAILY.   lisinopril 40 MG tablet Commonly known as:  PRINIVIL,ZESTRIL Take 40 mg by mouth daily.   LORazepam 1 MG tablet Commonly known as:  ATIVAN TAKE 1 TABLET BY MOUTH AT BEDTIME AS NEEDED FOR ANXIETY   metoprolol tartrate 25 MG tablet Commonly known as:  LOPRESSOR TAKE 1 TABLET BY MOUTH TWICE A DAY   nitroGLYCERIN 0.4 MG SL tablet Commonly known as:  NITROSTAT Place 0.4 mg under the  tongue every 5 (five) minutes as needed. If a third tablet is needed please call 911.   oxyCODONE 5 MG immediate release tablet Commonly known as:  Oxy IR/ROXICODONE Take 1-2 tablets (5-10 mg total) by mouth every 4 (four) hours as needed for severe pain or breakthrough pain.   pantoprazole 40 MG tablet Commonly known as:  PROTONIX TAKE 1 TABLET (40 MG TOTAL) BY MOUTH DAILY.   traMADol 50 MG tablet Commonly known as:  ULTRAM Take 1-2 tablets (50-100 mg total) by mouth every 4 (four) hours as needed for moderate pain.       Diagnostic Studies: Dg Knee Left Port  Result Date: 06/27/2016 CLINICAL DATA:  Status post left knee replacement EXAM: PORTABLE LEFT KNEE - 1-2 VIEW COMPARISON:  None. FINDINGS: Left knee prosthesis is seen in satisfactory position. No acute bony abnormality is noted. Surgical drains are noted in place. IMPRESSION: Status post left knee replacement. Electronically Signed   By: Inez Catalina M.D.   On: 06/27/2016 18:39    Disposition: 03-Skilled [place]., [place] On 07/10/2016.   Specialty:  Physician Assistant Why:  at 9:45am Contact information: [place] [address] [phone]        Dereck Leep, MD On 08/07/2016.   Specialty:  Orthopedic Surgery Why:  at 9:15am Contact information: [place] [address] [phone]            Signed: Watt Climes. 06/27/2016, 8:07 PM

## 2016-06-27 NOTE — Progress Notes (Signed)
Pt dangled with assist, tolerated well. Education on incentive spirometry while awake reinforced, pt was able to achieve 2023ml. Heels kept elevated off bed, left foot with bone foam on. Nursing continues to monitor.

## 2016-06-27 NOTE — Anesthesia Postprocedure Evaluation (Signed)
Anesthesia Post Note  Patient: Daniel Austin  Procedure(s) Performed: Procedure(s) (LRB): COMPUTER ASSISTED TOTAL KNEE ARTHROPLASTY (Left)  Patient location during evaluation: PACU Anesthesia Type: Spinal Level of consciousness: oriented and awake and alert Pain management: pain level controlled Vital Signs Assessment: post-procedure vital signs reviewed and stable Respiratory status: spontaneous breathing, respiratory function stable and nonlabored ventilation Cardiovascular status: blood pressure returned to baseline and stable Postop Assessment: no headache and no backache Anesthetic complications: no    Last Vitals:  Vitals:   06/27/16 1658 06/27/16 1713  BP: 116/77 137/79  Pulse: 64 60  Resp: 12 14  Temp:  36.5 C    Last Pain:  Vitals:   06/27/16 1713  TempSrc:   PainSc: 0-No pain                 Akirra Lacerda

## 2016-06-27 NOTE — Discharge Instructions (Signed)

## 2016-06-27 NOTE — Op Note (Signed)
OPERATIVE NOTE  DATE OF SURGERY:  06/27/2016  PATIENT NAME:  Daniel Austin   DOB: March 31, 1941  MRN: [id number]  PRE-OPERATIVE DIAGNOSIS: Degenerative arthrosis of the left knee, primary  POST-OPERATIVE DIAGNOSIS:  Same  PROCEDURE:  Left total knee arthroplasty  SURGEON:  Marciano Sequin. M.D.  ASSISTANT:  Vance Peper, PA (present and scrubbed throughout the case, critical for assistance with exposure, retraction, instrumentation, and closure)  ANESTHESIA: spinal  ESTIMATED BLOOD LOSS: 100 mL  FLUIDS REPLACED: 1200 mL of crystalloid  TOURNIQUET TIME: 92 minutes  DRAINS: 2 medium drains to a reinfusion system  SOFT TISSUE RELEASES: Anterior cruciate ligament, posterior cruciate ligament, deep medial collateral ligament, patellofemoral ligament  IMPLANTS UTILIZED: DePuy Attune size 8 posterior stabilized femoral component (cemented), size 8 rotating platform tibial component (cemented), 41 mm medialized dome patella (cemented), and a 8 mm stabilized rotating platform polyethylene insert.  INDICATIONS FOR SURGERY: Daniel Austin is a 75 y.o. year old male with a long history of progressive knee pain. X-rays demonstrated severe degenerative changes in tricompartmental fashion. The patient had not seen any significant improvement despite conservative nonsurgical intervention. After discussion of the risks and benefits of surgical intervention, the patient expressed understanding of the risks benefits and agree with plans for total knee arthroplasty.   The risks, benefits, and alternatives were discussed at length including but not limited to the risks of infection, bleeding, nerve injury, stiffness, blood clots, the need for revision surgery, cardiopulmonary complications, among others, and they were willing to proceed.  PROCEDURE IN DETAIL: The patient was brought into the operating room and, after adequate spinal anesthesia was achieved, a tourniquet was placed on the patient's  upper thigh. The patient's knee and leg were cleaned and prepped with alcohol and DuraPrep and draped in the usual sterile fashion. A "timeout" was performed as per usual protocol. The lower extremity was exsanguinated using an Esmarch, and the tourniquet was inflated to 300 mmHg. An anterior longitudinal incision was made followed by a standard mid vastus approach. The deep fibers of the medial collateral ligament were elevated in a subperiosteal fashion off of the medial flare of the tibia so as to maintain a continuous soft tissue sleeve. The patella was subluxed laterally and the patellofemoral ligament was incised. Inspection of the knee demonstrated severe degenerative changes with full-thickness loss of articular cartilage. Osteophytes were debrided using a rongeur. Anterior and posterior cruciate ligaments were excised. A pilot hole for the intramedullary femoral guide rod was created just anterior to the attachment of the anterior cruciate ligament. The distal femoral cutting guide was positioned so as to achieve a 5 distal valgus cut. The femur was sized and it was felt that a size 8 femoral component was appropriate. A size 8 femoral cutting guide was positioned and the anterior cut was performed. This was followed by completion of the posterior and chamfer cuts. Femoral cutting guide for the central box was then positioned in the center box cut was performed.  Attention was then directed to the proximal tibia. Medial and lateral menisci were excised. The extramedullary tibial cutting guide was positioned so as to achieve a 0 varus-valgus alignment and 3 posterior slope..The proximal tibia was sized and it was felt that a size 8 tibial tray was appropriate. Tibial and femoral trials were inserted followed by insertion of a 8 mm polyethylene insert. This allowed for excellent mediolateral soft tissue balancing both in flexion and in full extension. Finally, the patella was cut and prepared so  as to  accommodate a 41 mm medialized dome patella. A patella trial was placed and the knee was placed through a range of motion with excellent patellar tracking appreciated. The femoral trial was removed after debridement of posterior osteophytes. The central post-hole for the tibial component was reamed followed by insertion of a keel punch. Tibial trials were then removed. Cut surfaces of bone were irrigated with copious amounts of normal saline with antibiotic solution using pulsatile lavage and then suctioned dry. Polymethylmethacrylate cement was prepared in the usual fashion using a vacuum mixer. Cement was applied to the cut surface of the proximal tibia as well as along the undersurface of a size 8 rotating platform tibial component. Tibial component was positioned and impacted into place. Excess cement was removed using [employer] fast streamer. Cement was then applied to the cut surfaces of the femur as well as along the posterior flanges of the size 8 femoral component. The femoral component was positioned and impacted into place. Excess cement was removed using [employer] fast streamer. An 8 mm polyethylene trial was inserted and the knee was brought into full extension with steady axial compression applied. Finally, cement was applied to the backside of a 41 mm medialized dome patella and the patellar component was positioned and patellar clamp applied. Excess cement was removed using [employer] fast streamer. After adequate curing of the cement, the tourniquet was deflated after a total tourniquet time of 92 minutes. Hemostasis was achieved using electrocautery. The knee was irrigated with copious amounts of normal saline with antibiotic solution using pulsatile lavage and then suctioned dry. 20 mL of 1.3% Exparel and 60 mL of 0.25% Marcaine in 40 mL of normal saline was injected along the posterior capsule, medial and lateral gutters, and along the arthrotomy site. An 8 mm stabilized rotating platform polyethylene insert was inserted  and the knee was placed through a range of motion with excellent mediolateral soft tissue balancing appreciated and excellent patellar tracking noted. 2 medium drains were placed in the wound bed and brought out through separate stab incisions to be attached to a reinfusion system. The medial parapatellar portion of the incision was reapproximated using interrupted sutures of #1 Vicryl. Subcutaneous tissue was approximated in layers using first #0 Vicryl followed #2-0 Vicryl. The skin was approximated with skin staples. A sterile dressing was applied.  The patient tolerated the procedure well and was transported to the recovery room in stable condition.    Jeweldean Drohan P. Holley Bouche., M.D.

## 2016-06-28 ENCOUNTER — Encounter: Payer: Self-pay | Admitting: Orthopedic Surgery

## 2016-06-28 LAB — CBC
HEMATOCRIT: 31.5 % — AB (ref 40.0–52.0)
HEMOGLOBIN: 10.2 g/dL — AB (ref 13.0–18.0)
MCH: 26.1 pg (ref 26.0–34.0)
MCHC: 32.3 g/dL (ref 32.0–36.0)
MCV: 80.6 fL (ref 80.0–100.0)
Platelets: 202 10*3/uL (ref 150–440)
RBC: 3.91 MIL/uL — AB (ref 4.40–5.90)
RDW: 15.1 % — ABNORMAL HIGH (ref 11.5–14.5)
WBC: 6.9 10*3/uL (ref 3.8–10.6)

## 2016-06-28 LAB — BASIC METABOLIC PANEL
Anion gap: 9 (ref 5–15)
BUN: 12 mg/dL (ref 6–20)
CHLORIDE: 105 mmol/L (ref 101–111)
CO2: 22 mmol/L (ref 22–32)
Calcium: 8.3 mg/dL — ABNORMAL LOW (ref 8.9–10.3)
Creatinine, Ser: 0.87 mg/dL (ref 0.61–1.24)
GFR calc Af Amer: >60 mL/min (ref >60)
GFR calc non Af Amer: >60 mL/min (ref >60)
Glucose, Bld: 148 mg/dL — ABNORMAL HIGH (ref 65–99)
POTASSIUM: 4.1 mmol/L (ref 3.5–5.1)
SODIUM: 136 mmol/L (ref 135–145)

## 2016-06-28 MED ORDER — CHLORHEXIDINE GLUCONATE CLOTH 2 % EX PADS
6.0000 | MEDICATED_PAD | Freq: Every day | CUTANEOUS | Status: DC
  Administered 2016-06-28: 6 via TOPICAL

## 2016-06-28 MED ORDER — MUPIROCIN 2 % EX OINT
1.0000 "application " | TOPICAL_OINTMENT | Freq: Two times a day (BID) | CUTANEOUS | Status: DC
  Administered 2016-06-29: 1 via NASAL
  Filled 2016-06-28: qty 22

## 2016-06-28 MED ORDER — SALINE SPRAY 0.65 % NA SOLN
1.0000 | NASAL | Status: DC | PRN
  Filled 2016-06-28: qty 44

## 2016-06-28 NOTE — Progress Notes (Signed)
   Subjective: 1 Day Post-Op Procedure(s) (LRB): COMPUTER ASSISTED TOTAL KNEE ARTHROPLASTY (Left) Patient reports pain as 4 on 0-10 scale.   Patient is well, and has had no acute complaints or problems We will start therapy today.  Plan is to go Home after hospital stay. nausea and vomiting. Pt thinks it is 2/2 sinus Patient denies any chest pains or shortness of breath. Objective: Vital signs in last 24 hours: Temp:  [97.6 F (36.4 C)-98.8 F (37.1 C)] 98.8 F (37.1 C) (10/05 0359) Pulse Rate:  [60-85] 83 (10/05 0359) Resp:  [12-20] 18 (10/05 0359) BP: (85-149)/(48-88) 110/58 (10/05 0359) SpO2:  [97 %-100 %] 97 % (10/05 0359) Weight:  [90.3 kg (199 lb)] 90.3 kg (199 lb) (10/04 1025) Heels are non tender and elevated off the bed using rolled towels Intake/Output from previous day: 10/04 0701 - 10/05 0700 In: 2401.7 [P.O.:120; I.V.:2081.7; IV Piggyback:200] Out: 1030 [Urine:750; Drains:180; Blood:100] Intake/Output this shift: No intake/output data recorded.   Recent Labs  06/28/16 0407  HGB 10.2*    Recent Labs  06/28/16 0407  WBC 6.9  RBC 3.91*  HCT 31.5*  PLT 202    Recent Labs  06/28/16 0407  NA 136  K 4.1  CL 105  CO2 22  BUN 12  CREATININE 0.87  GLUCOSE 148*  CALCIUM 8.3*   No results for input(s): LABPT, INR in the last 72 hours.  EXAM General - Patient is Alert, Appropriate and Oriented Extremity - Neurologically intact Neurovascular intact Sensation intact distally Intact pulses distally Dorsiflexion/Plantar flexion intact Compartment soft Dressing - dressing C/D/I Motor Function - intact, moving foot and toes well on exam.    Past Medical History:  Diagnosis Date  . Anemia   . Anxiety   . Arthritis   . CAD (coronary artery disease)   . Cardiomyopathy ([place])   . Carotid artery stenosis    high-grade symptomatic right sided s/p stent twice  . CHF (congestive heart failure) (H[facility])    chronic movement fluid and history of CHF he  noticed in the   . Depression   . Fractures, multiple    Metal rod in left foot  . GERD (gastroesophageal reflux disease)   . Heart block   . Hypercholesterolemia   . Hyperglycemia   . Hypertension   . Hypothyroidism   . Knee pain   . Presence of permanent cardiac pacemaker   . Shortness of breath dyspnea   . Stroke [facility])    clot in neck broke off left side weakness 2008  resolved  . Syncope    second degree heart block s/p St Jude's pacemaker placement 12/08 with his history is limited with any  . Ulcer of gastric fundus     Assessment/Plan: 1 Day Post-Op Procedure(s) (LRB): COMPUTER ASSISTED TOTAL KNEE ARTHROPLASTY (Left) Active Problems:   S/P total knee arthroplasty  Estimated body mass index is 27.75 kg/m as calculated from the following:   Height as of this encounter: 5\' 11"  (1.803 m).   Weight as of this encounter: 90.3 kg (199 lb). Advance diet Up with therapy D/C IV fluids Plan for discharge tomorrow Discharge home with home health  Labs: reviewed DVT Prophylaxis - Lovenox, Foot Pumps and TED hose Weight-Bearing as tolerated to left leg D/C O2 and Pulse OX and try on Room Air Begin working on a bowel movement Labs in am   [employer]. [place] Nora 06/28/2016, 7:35 AM

## 2016-06-28 NOTE — Progress Notes (Signed)
Clinical Social Worker (CSW) received SNF consult. PT is recommending home health. RN case manager is aware of above. Please reconsult if future social work needs arise. CSW signing off.   Miles Borkowski, LCSW [phone] 

## 2016-06-28 NOTE — Progress Notes (Signed)
Patient is A&O x4. Up with assist x1 and WW. Dressing is CD&I. Advanced diet, tolerated well. Drain in place. Gave Tramadol x1, with good relief. Up in hallway x2 with therapy. Family at bedside. Bed/chair alarm in place. Urinating via urinal at bedside.

## 2016-06-28 NOTE — Progress Notes (Signed)
Chaplain visited the patient to introduce chaplain service when he met the patient's family members who were with him. Patient recognized the chaplain and talk to chaplain about his family and things he is grateful for and then asked chaplain for prayers which the chaplain provided with presence.     06/28/16 1500  Clinical Encounter Type  Visited With Patient  Visit Type Spiritual support  Referral From Nurse  Spiritual Encounters  Spiritual Needs Prayer

## 2016-06-28 NOTE — Care Management Note (Addendum)
Case Management Note  Patient Details  Name: Daniel Austin MRN: [id number] Date of Birth: March 03, 1941  Subjective/Objective:                  Met with patient to discuss discharge planning. Patient would like to return home with his wife and family at discharge. He would like to use [facility] for home health physical therapy. He has a rolling walker and bedside commode available for use at home. He use [facility] [phone] for medications.   Action/Plan: List of home health agencies left with patient. Referral called to [facility]. Lovenox 58m #14 called in to [facility].   Expected Discharge Date:                  Expected Discharge Plan:     In-House Referral:     Discharge planning Services  CM Consult  Post Acute Care Choice:  Home Health Choice offered to:  Patient  DME Arranged:    DME Agency:     [facility]  [facility]  Status of Service:  In process, will continue to follow  If discussed at Long Length of Stay Meetings, dates discussed:    Additional Comments: Lovenox $ 28.53- patient informed.    AMarshell Garfinkel RN 06/28/2016, 10:32 AM

## 2016-06-28 NOTE — Progress Notes (Signed)
Physical Therapy Treatment Patient Details Name: Daniel Austin MRN: [id number] DOB: September 24, 1941 Today's Date: 06/28/2016    History of Present Illness 75 y/o male s/p L TKA.  He had his R knee replaced a few months ago and did relatively well.    PT Comments    Pt was able to increase ambulation distance while showing good confidence and safety.  He was able to tolerate exercises with very little increased pain and generally is doing better than expected POD1 milestones in all aspects.  Pt eager to work hard with PT and to go home.   Follow Up Recommendations  Home health PT     Equipment Recommendations       Recommendations for Other Services       Precautions / Restrictions Precautions Precautions: Fall Restrictions Weight Bearing Restrictions: Yes LLE Weight Bearing: Weight bearing as tolerated    Mobility  Bed Mobility Overal bed mobility: Independent             General bed mobility comments: Pt able to get LEs up into bed w/o direct assist  Transfers Overall transfer level: Modified independent Equipment used: Rolling walker (2 wheeled)             General transfer comment: Pt able to rise from recliner w/o assist, but does need extra effort to get to fully upright and is heavily reliant on UEs. Good balance once in standing  Ambulation/Gait Ambulation/Gait assistance: Modified independent (Device/Increase time) Ambulation Distance (Feet): 80 Feet Assistive device: Rolling walker (2 wheeled)       General Gait Details: Pt does well with ambulation and after some stiffness the first few steps he ultimately is able to increase cadence and speed showing good tolerance and essentially no hesitancy.   Stairs            Wheelchair Mobility    Modified Rankin (Stroke Patients Only)       Balance Overall balance assessment: Modified Independent                                  Cognition Arousal/Alertness:  Awake/alert Behavior During Therapy: WFL for tasks assessed/performed Overall Cognitive Status: Within Functional Limits for tasks assessed                      Exercises Total Joint Exercises Ankle Circles/Pumps: Strengthening;10 reps Quad Sets: 15 reps;Strengthening Gluteal Sets: Strengthening;15 reps Short Arc Quad: Strengthening;15 reps (AAROM with final few degrees of TKE) Heel Slides: Strengthening;10 reps Hip ABduction/ADduction: 15 reps;Strengthening Straight Leg Raises: 15 reps;Strengthening;AROM Knee Flexion: PROM;5 reps Goniometric ROM: >90, still lacking minimal TKE    General Comments        Pertinent Vitals/Pain Pain Assessment: 0-10 Pain Score: 2  (pain increases minimally with activity/ROM) Pain Location: L knee Pain Descriptors / Indicators: Aching;Tender Pain Intervention(s): Limited activity within patient's tolerance;Monitored during session;Premedicated before session    Home Living Family/patient expects to be discharged to:: Private residence Living Arrangements: Spouse/significant other;Children Available Help at Discharge: Family Type of Home: House Home Access: Stairs to enter Entrance Stairs-Rails: Left;Right Home Layout: Able to live on main level with bedroom/bathroom Home Equipment: Environmental consultant - 2 wheels;Walker - 4 wheels;Shower seat - built in;Grab bars - toilet;Toilet riser;Adaptive equipment;Hand held shower head      Prior Function Level of Independence: Independent with assistive device(s)      Comments: pt was using AD for  dressing since last knee surgery   PT Goals (current goals can now be found in the care plan section) Acute Rehab PT Goals Patient Stated Goal: "to go home this time, no nursing home rehab for me" Progress towards PT goals: Progressing toward goals    Frequency    BID      PT Plan Current plan remains appropriate    Co-evaluation             End of Session Equipment Utilized During Treatment:  Gait belt Activity Tolerance: Patient tolerated treatment well Patient left: with call bell/phone within reach;with family/visitor present;with bed alarm set     Time: 1432-1456 PT Time Calculation (min) (ACUTE ONLY): 24 min  Charges:  $Gait Training: 8-22 mins $Therapeutic Exercise: 8-22 mins                    G Codes:      Kreg Shropshire, DPT 06/28/2016, 3:47 PM

## 2016-06-28 NOTE — Progress Notes (Signed)
Foley catheter discontinued, tolerated well by pt. Pt seen doing incentive spirometry, achieved 2237ml.

## 2016-06-28 NOTE — Evaluation (Signed)
Physical Therapy Evaluation Patient Details Name: Daniel Austin MRN: [id number] DOB: 1941-09-12 Today's Date: 06/28/2016   History of Present Illness  75 y/o male s/p L TKA.  He had his R knee replaced a few months ago and did relatively well.  Clinical Impression  Pt is able to ambulate well with walker, shows good strength and confidence with SLRs, achieves >90 degrees of flexion and generally is doing very well for POD1 expectations.  He showed good strength and effort with 10 minutes of LE exercises apart from the eval and is pleasant and motivated the entire time.  Expect pt to continue to make good gains and be able to go home.      Follow Up Recommendations Home health PT    Equipment Recommendations       Recommendations for Other Services       Precautions / Restrictions Precautions Precautions: Fall Restrictions LLE Weight Bearing: Weight bearing as tolerated      Mobility  Bed Mobility Overal bed mobility: Modified Independent             General bed mobility comments: Pt is not heavily reliant on the rails and does not need direct physical assist  Transfers Overall transfer level: Modified independent Equipment used: Rolling walker (2 wheeled)             General transfer comment: Pt did well getting to standing, needed cuing for hand placement and LE set up cuing but with increased effort rises w/o assist  Ambulation/Gait Ambulation/Gait assistance: Min guard Ambulation Distance (Feet): 75 Feet Assistive device: Rolling walker (2 wheeled)       General Gait Details: Pt is able to ambulate with consistent walker motion showing no hesitation on L LE and had no dizziness/nausea as he did with his previous TKA.  Stairs            Wheelchair Mobility    Modified Rankin (Stroke Patients Only)       Balance                                             Pertinent Vitals/Pain Pain Assessment: 0-10 Pain Score: 3      Home Living Family/patient expects to be discharged to:: Private residence Living Arrangements: Spouse/significant other;Children Available Help at Discharge: Family Type of Home: House Home Access: Stairs to enter Entrance Stairs-Rails: Chemical engineer of Steps: 4 Home Layout: Able to live on main level with bedroom/bathroom        Prior Function Level of Independence: Independent with assistive device(s)         Comments: Apparently pt did need ADs, but was able to ambulate and generally be active.     Hand Dominance        Extremity/Trunk Assessment   Upper Extremity Assessment: Overall WFL for tasks assessed           Lower Extremity Assessment: Overall WFL for tasks assessed (able to do SLRs, good strength with leg ext, min post-op dec)         Communication   Communication: No difficulties  Cognition Arousal/Alertness: Awake/alert Behavior During Therapy: WFL for tasks assessed/performed Overall Cognitive Status: Within Functional Limits for tasks assessed                      General Comments  Exercises Total Joint Exercises Ankle Circles/Pumps: AROM;10 reps Quad Sets: Strengthening;10 reps Gluteal Sets: Strengthening;10 reps Heel Slides: Strengthening;10 reps Hip ABduction/ADduction: Strengthening;10 reps Straight Leg Raises: AROM;Strengthening;10 reps Knee Flexion: PROM;5 reps Goniometric ROM: 1-94   Assessment/Plan    PT Assessment Patient needs continued PT services  PT Problem List Decreased strength;Decreased range of motion;Decreased balance;Decreased activity tolerance;Decreased mobility;Decreased safety awareness;Decreased knowledge of use of DME          PT Treatment Interventions DME instruction;Gait training;Stair training;Functional mobility training;Therapeutic activities;Therapeutic exercise;Balance training    PT Goals (Current goals can be found in the Care Plan section)  Acute Rehab PT  Goals Patient Stated Goal: go home PT Goal Formulation: With patient/family Time For Goal Achievement: 07/12/16 Potential to Achieve Goals: Good    Frequency BID   Barriers to discharge        Co-evaluation               End of Session Equipment Utilized During Treatment: Gait belt Activity Tolerance: Patient tolerated treatment well Patient left: with chair alarm set;with call bell/phone within reach;with family/visitor present           Time: [id number] PT Time Calculation (min) (ACUTE ONLY): 28 min   Charges:   PT Evaluation $PT Eval Low Complexity: 1 Procedure PT Treatments $Therapeutic Exercise: 8-22 mins   PT G Codes:        Kreg Shropshire, DPT 06/28/2016, 10:01 AM

## 2016-06-28 NOTE — Evaluation (Signed)
Occupational Therapy Evaluation Patient Details Name: NICKALIS WARRELL MRN: [id number] DOB: Aug 08, 1941 Today's Date: 06/28/2016    History of Present Illness 75 y/o male s/p L TKA.  He had his R knee replaced a few months ago and did relatively well.   Clinical Impression   Pt is 75 year old male s/p L TKA.  Pt was independent in all ADLs prior to surgery using AD for dressing and is eager to return to PLOF.  Pt currently requires min assist for LB dressing while in seated position due to pain and limited AROM of L knee.  Pt would benefit from instruction in dressing techniques with or without assistive devices for dressing and bathing skills.  Pt would also benefit from recommendations for home modifications to increase safety in the bathroom and prevent falls. Rec OT HH to assess safety mainly with bathing and ensure follow through of use of AD for dressing.      Follow Up Recommendations  Home health OT    Equipment Recommendations       Recommendations for Other Services       Precautions / Restrictions Precautions Precautions: Fall Restrictions Weight Bearing Restrictions: Yes LLE Weight Bearing: Weight bearing as tolerated      Mobility Bed Mobility                  Transfers                      Balance                                            ADL Overall ADL's : Needs assistance/impaired Eating/Feeding: Independent;Set up   Grooming: Wash/dry hands;Wash/dry face;Oral care;Applying deodorant;Brushing hair;Independent;Set up           Upper Body Dressing : Independent;Set up   Lower Body Dressing: Set up;Minimal assistance;With adaptive equipment                 General ADL Comments: Pt able to demonstrate and state proper use of AD for dressing and has all AD at home and using it since prior knee surgery.     Vision     Perception     Praxis      Pertinent Vitals/Pain Pain Assessment: 0-10 Pain  Score: 2  Pain Location: L knee Pain Descriptors / Indicators: Aching;Tender Pain Intervention(s): Limited activity within patient's tolerance;Monitored during session;Premedicated before session     Hand Dominance Right   Extremity/Trunk Assessment Upper Extremity Assessment Upper Extremity Assessment: Overall WFL for tasks assessed   Lower Extremity Assessment Lower Extremity Assessment: Defer to PT evaluation       Communication Communication Communication: No difficulties   Cognition Arousal/Alertness: Awake/alert Behavior During Therapy: WFL for tasks assessed/performed Overall Cognitive Status: Within Functional Limits for tasks assessed                     General Comments       Exercises       Shoulder Instructions      Home Living Family/patient expects to be discharged to:: Private residence Living Arrangements: Spouse/significant other;Children Available Help at Discharge: Family Type of Home: House Home Access: Stairs to enter Technical brewer of Steps: 4 Entrance Stairs-Rails: Left;Right Home Layout: Able to live on main level with bedroom/bathroom     Bathroom  Shower/Tub: Walk-in Hydrologist: Handicapped height Bathroom Accessibility: Yes   Home Equipment: Environmental consultant - 2 wheels;Walker - 4 wheels;Shower seat - built in;Grab bars - toilet;Toilet riser;Adaptive equipment;Hand held shower head Adaptive Equipment: Reacher;Sock aid;Long-handled shoe horn;Long-handled sponge        Prior Functioning/Environment Level of Independence: Independent with assistive device(s)        Comments: pt was using AD for dressing since last knee surgery        OT Problem List: Decreased strength;Decreased range of motion;Decreased knowledge of use of DME or AE;Pain   OT Treatment/Interventions: Self-care/ADL training;Patient/family education;DME and/or AE instruction    OT Goals(Current goals can be found in the care plan  section) Acute Rehab OT Goals Patient Stated Goal: "to go home this time, no nursing home rehab for me" OT Goal Formulation: With patient/family Time For Goal Achievement: 07/12/16 Potential to Achieve Goals: Good ADL Goals Pt Will Perform Lower Body Dressing: with supervision;with adaptive equipment;sit to/from stand Pt Will Transfer to Toilet: with supervision;stand pivot transfer;bedside commode (BSC overt toilet to simulate handicapped height toilet at ho)  OT Frequency: Min 1X/week   Barriers to D/C:            Co-evaluation              End of Session    Activity Tolerance: Patient tolerated treatment well Patient left: in chair;with call bell/phone within reach;with chair alarm set;with family/visitor present   Time: [id number] OT Time Calculation (min): 32 min Charges:  OT General Charges $OT Visit: 1 Procedure OT Evaluation $OT Eval Low Complexity: 1 Procedure OT Treatments $Self Care/Home Management : 8-22 mins G-Codes:     Chrys Racer, OTR/L ascom [phone] 06/28/16, 3:07 PM

## 2016-06-29 LAB — BASIC METABOLIC PANEL
Anion gap: 5 (ref 5–15)
BUN: 14 mg/dL (ref 6–20)
CO2: 27 mmol/L (ref 22–32)
Calcium: 8.2 mg/dL — ABNORMAL LOW (ref 8.9–10.3)
Chloride: 102 mmol/L (ref 101–111)
Creatinine, Ser: 0.88 mg/dL (ref 0.61–1.24)
GFR calc Af Amer: >60 mL/min (ref >60)
GFR calc non Af Amer: >60 mL/min (ref >60)
Glucose, Bld: 131 mg/dL — ABNORMAL HIGH (ref 65–99)
Potassium: 3.9 mmol/L (ref 3.5–5.1)
Sodium: 134 mmol/L — ABNORMAL LOW (ref 135–145)

## 2016-06-29 LAB — CBC
HCT: 27.4 % — ABNORMAL LOW (ref 40.0–52.0)
Hemoglobin: 9.5 g/dL — ABNORMAL LOW (ref 13.0–18.0)
MCH: 27.5 pg (ref 26.0–34.0)
MCHC: 34.7 g/dL (ref 32.0–36.0)
MCV: 79.1 fL — ABNORMAL LOW (ref 80.0–100.0)
Platelets: 186 10*3/uL (ref 150–440)
RBC: 3.46 MIL/uL — ABNORMAL LOW (ref 4.40–5.90)
RDW: 15.5 % — ABNORMAL HIGH (ref 11.5–14.5)
WBC: 7.4 10*3/uL (ref 3.8–10.6)

## 2016-06-29 MED ORDER — TRAMADOL HCL 50 MG PO TABS: 50.0000 mg | ORAL_TABLET | ORAL | 1 refills | Status: DC | PRN

## 2016-06-29 MED ORDER — OXYCODONE HCL 5 MG PO TABS: 5.0000 mg | ORAL_TABLET | ORAL | 0 refills | Status: DC | PRN

## 2016-06-29 MED ORDER — LACTULOSE 10 GM/15ML PO SOLN
10.0000 g | Freq: Two times a day (BID) | ORAL | Status: DC | PRN
  Administered 2016-06-29: 10 g via ORAL
  Filled 2016-06-29: qty 30

## 2016-06-29 NOTE — Care Management (Signed)
Notified [facility] of discharge.  Lovenox was called to patient's pharmacy 10.5.

## 2016-06-29 NOTE — Progress Notes (Signed)
Physical Therapy Treatment Patient Details Name: Daniel Austin MRN: [id number] DOB: 29-Mar-1941 Today's Date: 06/29/2016    History of Present Illness 75 y/o male s/p L TKA.  He had his R knee replaced a few months ago and did relatively well.    PT Comments    Pt is able to ambulate fully around the nurses' station, showed good confidence with the steps and overall did well.  He continues to have PROM to essentially full knee extension, but still lacks a few degrees of TKE with against gravity AROM, he did have >100 degrees of flexion with relative ease.  Pt did well without a lot of pain and overall is making good gains.  Follow Up Recommendations  Home health PT     Equipment Recommendations       Recommendations for Other Services       Precautions / Restrictions Precautions Precautions: Fall Restrictions LLE Weight Bearing: Weight bearing as tolerated    Mobility  Bed Mobility Overal bed mobility: Independent             General bed mobility comments: Pt able to get sitting EOB w/o assist  Transfers Overall transfer level: Independent Equipment used: Rolling walker (2 wheeled)             General transfer comment: Pt able to rise w/o direct assist, needed very little cuing/set up  Ambulation/Gait Ambulation/Gait assistance: Modified independent (Device/Increase time) Ambulation Distance (Feet): 250 Feet Assistive device: Rolling walker (2 wheeled)       General Gait Details: Pt shows consistent speed and good confidence with ambulation.  He had only some initial siffness but then showed no hesitation for most of the effort.  He does struggle with TKE on the L but has no buckling or safety issues. Pt with only minimal fatigue after prolonged ambulation.    Stairs Stairs: Yes Stairs assistance: Supervision Stair Management: Two rails Number of Stairs: 8 General stair comments: Pt remembered proper technique and overall showed good safety and good  relative confidence with negotiating steps.   Wheelchair Mobility    Modified Rankin (Stroke Patients Only)       Balance Overall balance assessment: Modified Independent                                  Cognition Arousal/Alertness: Awake/alert Behavior During Therapy: WFL for tasks assessed/performed Overall Cognitive Status: Within Functional Limits for tasks assessed                      Exercises Total Joint Exercises Ankle Circles/Pumps: Strengthening;10 reps Quad Sets: 15 reps;Strengthening Gluteal Sets: Strengthening;15 reps Short Arc Quad: Strengthening;15 reps Heel Slides: Strengthening;10 reps Hip ABduction/ADduction: 15 reps;Strengthening Straight Leg Raises: 15 reps;Strengthening;AROM Knee Flexion: PROM;5 reps Goniometric ROM: 1-105    General Comments        Pertinent Vitals/Pain Pain Score: 3     Home Living                      Prior Function            PT Goals (current goals can now be found in the care plan section) Progress towards PT goals: Progressing toward goals    Frequency    BID      PT Plan Current plan remains appropriate    Co-evaluation  End of Session Equipment Utilized During Treatment: Gait belt Activity Tolerance: Patient tolerated treatment well Patient left: with family/visitor present;with chair alarm set;with call bell/phone within reach     Time: [id number] PT Time Calculation (min) (ACUTE ONLY): 28 min  Charges:  $Gait Training: 8-22 mins $Therapeutic Exercise: 8-22 mins                    G Codes:      Kreg Shropshire, DPT 06/29/2016, 10:05 AM

## 2016-06-29 NOTE — Progress Notes (Signed)
   Subjective: 2 Days Post-Op Procedure(s) (LRB): COMPUTER ASSISTED TOTAL KNEE ARTHROPLASTY (Left) Patient reports pain as 2 on 0-10 scale.   Patient is well, and has had no acute complaints or problems Continue with  therapy today.  Plan is to go Home after hospital stay. no nausea and no vomiting Patient denies any chest pains or shortness of breath. Objective: Vital signs in last 24 hours: Temp:  [98.1 F (36.7 C)-99 F (37.2 C)] 98.9 F (37.2 C) (10/06 0412) Pulse Rate:  [73-87] 77 (10/06 0412) Resp:  [18-19] 19 (10/06 0412) BP: (103-164)/(50-80) 164/80 (10/06 0412) SpO2:  [96 %-100 %] 96 % (10/06 0412) well approximated incision Heels are non tender and elevated off the bed using rolled towels Intake/Output from previous day: 10/05 0701 - 10/06 0700 In: 480 [P.O.:480] Out: 1180 [Urine:950; Drains:230] Intake/Output this shift: No intake/output data recorded.   Recent Labs  06/28/16 0407 06/29/16 0352  HGB 10.2* 9.5*    Recent Labs  06/28/16 0407 06/29/16 0352  WBC 6.9 7.4  RBC 3.91* 3.46*  HCT 31.5* 27.4*  PLT 202 186    Recent Labs  06/28/16 0407 06/29/16 0352  NA 136 134*  K 4.1 3.9  CL 105 102  CO2 22 27  BUN 12 14  CREATININE 0.87 0.88  GLUCOSE 148* 131*  CALCIUM 8.3* 8.2*   No results for input(s): LABPT, INR in the last 72 hours.  EXAM General - Patient is Alert, Appropriate and Oriented Extremity - Neurologically intact Neurovascular intact Sensation intact distally Intact pulses distally Dorsiflexion/Plantar flexion intact No cellulitis present Compartment soft Dressing - dressing C/D/I Motor Function - intact, moving foot and toes well on exam.    Past Medical History:  Diagnosis Date  . Anemia   . Anxiety   . Arthritis   . CAD (coronary artery disease)   . Cardiomyopathy ([place])   . Carotid artery stenosis    high-grade symptomatic right sided s/p stent twice  . CHF (congestive heart failure) ([facility])    chronic movement  fluid and history of CHF he noticed in the   . Depression   . Fractures, multiple    Metal rod in left foot  . GERD (gastroesophageal reflux disease)   . Heart block   . Hypercholesterolemia   . Hyperglycemia   . Hypertension   . Hypothyroidism   . Knee pain   . Presence of permanent cardiac pacemaker   . Shortness of breath dyspnea   . Stroke [facility])    clot in neck broke off left side weakness 2008  resolved  . Syncope    second degree heart block s/p St Jude's pacemaker placement 12/08 with his history is limited with any  . Ulcer of gastric fundus     Assessment/Plan: 2 Days Post-Op Procedure(s) (LRB): COMPUTER ASSISTED TOTAL KNEE ARTHROPLASTY (Left) Active Problems:   S/P total knee arthroplasty  Estimated body mass index is 27.75 kg/m as calculated from the following:   Height as of this encounter: 5\' 11"  (1.803 m).   Weight as of this encounter: 90.3 kg (199 lb). Up with therapy Discharge home with home health  Labs: were reviewed DVT Prophylaxis - Lovenox, Foot Pumps and TED hose Weight-Bearing as tolerated to left leg Hemovac d/c'd Pt needs a bowel movement today. Please change dressing prior to d/c  Jon R. [place] 06/29/2016, 7:41 AM

## 2016-06-29 NOTE — Progress Notes (Signed)
Patient was discharged home via wheelchair and family. IVs removed with caths in place. Wife was in during discharge. Reviewed meds and last doses giving along with scripts. Dressing was changed this am by PA. Allowed time for questions.

## 2016-07-01 DIAGNOSIS — Z471 Aftercare following joint replacement surgery: Secondary | ICD-10-CM | POA: Diagnosis not present

## 2016-07-01 DIAGNOSIS — I110 Hypertensive heart disease with heart failure: Secondary | ICD-10-CM | POA: Diagnosis not present

## 2016-07-01 DIAGNOSIS — M1990 Unspecified osteoarthritis, unspecified site: Secondary | ICD-10-CM | POA: Diagnosis not present

## 2016-07-01 DIAGNOSIS — I2510 Atherosclerotic heart disease of native coronary artery without angina pectoris: Secondary | ICD-10-CM | POA: Diagnosis not present

## 2016-07-01 DIAGNOSIS — I5022 Chronic systolic (congestive) heart failure: Secondary | ICD-10-CM | POA: Diagnosis not present

## 2016-07-01 DIAGNOSIS — I255 Ischemic cardiomyopathy: Secondary | ICD-10-CM | POA: Diagnosis not present

## 2016-07-03 DIAGNOSIS — M1990 Unspecified osteoarthritis, unspecified site: Secondary | ICD-10-CM | POA: Diagnosis not present

## 2016-07-03 DIAGNOSIS — I5022 Chronic systolic (congestive) heart failure: Secondary | ICD-10-CM | POA: Diagnosis not present

## 2016-07-03 DIAGNOSIS — I2510 Atherosclerotic heart disease of native coronary artery without angina pectoris: Secondary | ICD-10-CM | POA: Diagnosis not present

## 2016-07-03 DIAGNOSIS — Z471 Aftercare following joint replacement surgery: Secondary | ICD-10-CM | POA: Diagnosis not present

## 2016-07-03 DIAGNOSIS — I255 Ischemic cardiomyopathy: Secondary | ICD-10-CM | POA: Diagnosis not present

## 2016-07-03 DIAGNOSIS — I110 Hypertensive heart disease with heart failure: Secondary | ICD-10-CM | POA: Diagnosis not present

## 2016-07-04 DIAGNOSIS — I5022 Chronic systolic (congestive) heart failure: Secondary | ICD-10-CM | POA: Diagnosis not present

## 2016-07-04 DIAGNOSIS — M1990 Unspecified osteoarthritis, unspecified site: Secondary | ICD-10-CM | POA: Diagnosis not present

## 2016-07-04 DIAGNOSIS — I2510 Atherosclerotic heart disease of native coronary artery without angina pectoris: Secondary | ICD-10-CM | POA: Diagnosis not present

## 2016-07-04 DIAGNOSIS — Z471 Aftercare following joint replacement surgery: Secondary | ICD-10-CM | POA: Diagnosis not present

## 2016-07-04 DIAGNOSIS — I110 Hypertensive heart disease with heart failure: Secondary | ICD-10-CM | POA: Diagnosis not present

## 2016-07-04 DIAGNOSIS — I255 Ischemic cardiomyopathy: Secondary | ICD-10-CM | POA: Diagnosis not present

## 2016-07-06 DIAGNOSIS — M1990 Unspecified osteoarthritis, unspecified site: Secondary | ICD-10-CM | POA: Diagnosis not present

## 2016-07-06 DIAGNOSIS — I5022 Chronic systolic (congestive) heart failure: Secondary | ICD-10-CM | POA: Diagnosis not present

## 2016-07-06 DIAGNOSIS — Z471 Aftercare following joint replacement surgery: Secondary | ICD-10-CM | POA: Diagnosis not present

## 2016-07-06 DIAGNOSIS — I110 Hypertensive heart disease with heart failure: Secondary | ICD-10-CM | POA: Diagnosis not present

## 2016-07-06 DIAGNOSIS — I2510 Atherosclerotic heart disease of native coronary artery without angina pectoris: Secondary | ICD-10-CM | POA: Diagnosis not present

## 2016-07-06 DIAGNOSIS — I255 Ischemic cardiomyopathy: Secondary | ICD-10-CM | POA: Diagnosis not present

## 2016-07-09 DIAGNOSIS — Z471 Aftercare following joint replacement surgery: Secondary | ICD-10-CM | POA: Diagnosis not present

## 2016-07-09 DIAGNOSIS — M1990 Unspecified osteoarthritis, unspecified site: Secondary | ICD-10-CM | POA: Diagnosis not present

## 2016-07-09 DIAGNOSIS — I2510 Atherosclerotic heart disease of native coronary artery without angina pectoris: Secondary | ICD-10-CM | POA: Diagnosis not present

## 2016-07-09 DIAGNOSIS — I255 Ischemic cardiomyopathy: Secondary | ICD-10-CM | POA: Diagnosis not present

## 2016-07-09 DIAGNOSIS — I5022 Chronic systolic (congestive) heart failure: Secondary | ICD-10-CM | POA: Diagnosis not present

## 2016-07-09 DIAGNOSIS — I110 Hypertensive heart disease with heart failure: Secondary | ICD-10-CM | POA: Diagnosis not present

## 2016-07-10 DIAGNOSIS — Z96652 Presence of left artificial knee joint: Secondary | ICD-10-CM | POA: Diagnosis not present

## 2016-07-13 DIAGNOSIS — Z96652 Presence of left artificial knee joint: Secondary | ICD-10-CM | POA: Diagnosis not present

## 2016-07-16 DIAGNOSIS — I341 Nonrheumatic mitral (valve) prolapse: Secondary | ICD-10-CM | POA: Diagnosis not present

## 2016-07-16 DIAGNOSIS — Z9889 Other specified postprocedural states: Secondary | ICD-10-CM | POA: Diagnosis not present

## 2016-07-16 DIAGNOSIS — I10 Essential (primary) hypertension: Secondary | ICD-10-CM | POA: Diagnosis not present

## 2016-07-16 DIAGNOSIS — I6521 Occlusion and stenosis of right carotid artery: Secondary | ICD-10-CM | POA: Diagnosis not present

## 2016-07-16 DIAGNOSIS — I472 Ventricular tachycardia: Secondary | ICD-10-CM | POA: Diagnosis not present

## 2016-07-16 DIAGNOSIS — I5022 Chronic systolic (congestive) heart failure: Secondary | ICD-10-CM | POA: Diagnosis not present

## 2016-07-16 DIAGNOSIS — I340 Nonrheumatic mitral (valve) insufficiency: Secondary | ICD-10-CM | POA: Diagnosis not present

## 2016-07-16 DIAGNOSIS — E7800 Pure hypercholesterolemia, unspecified: Secondary | ICD-10-CM | POA: Diagnosis not present

## 2016-07-16 DIAGNOSIS — I2510 Atherosclerotic heart disease of native coronary artery without angina pectoris: Secondary | ICD-10-CM | POA: Diagnosis not present

## 2016-07-16 DIAGNOSIS — I255 Ischemic cardiomyopathy: Secondary | ICD-10-CM | POA: Diagnosis not present

## 2016-07-16 DIAGNOSIS — Z96652 Presence of left artificial knee joint: Secondary | ICD-10-CM | POA: Diagnosis not present

## 2016-07-16 DIAGNOSIS — I459 Conduction disorder, unspecified: Secondary | ICD-10-CM | POA: Diagnosis not present

## 2016-07-18 DIAGNOSIS — Z96652 Presence of left artificial knee joint: Secondary | ICD-10-CM | POA: Diagnosis not present

## 2016-07-20 DIAGNOSIS — Z96652 Presence of left artificial knee joint: Secondary | ICD-10-CM | POA: Diagnosis not present

## 2016-07-21 ENCOUNTER — Other Ambulatory Visit: Payer: Self-pay | Admitting: Internal Medicine

## 2016-07-23 DIAGNOSIS — Z96652 Presence of left artificial knee joint: Secondary | ICD-10-CM | POA: Diagnosis not present

## 2016-07-23 NOTE — Telephone Encounter (Signed)
faxed

## 2016-07-23 NOTE — Telephone Encounter (Signed)
Last filled 05/03/16 30 1rf

## 2016-07-25 DIAGNOSIS — Z96652 Presence of left artificial knee joint: Secondary | ICD-10-CM | POA: Diagnosis not present

## 2016-07-27 DIAGNOSIS — Z96652 Presence of left artificial knee joint: Secondary | ICD-10-CM | POA: Diagnosis not present

## 2016-08-01 DIAGNOSIS — Z96652 Presence of left artificial knee joint: Secondary | ICD-10-CM | POA: Diagnosis not present

## 2016-08-03 DIAGNOSIS — Z96652 Presence of left artificial knee joint: Secondary | ICD-10-CM | POA: Diagnosis not present

## 2016-08-06 DIAGNOSIS — Z96652 Presence of left artificial knee joint: Secondary | ICD-10-CM | POA: Diagnosis not present

## 2016-08-07 DIAGNOSIS — Z96652 Presence of left artificial knee joint: Secondary | ICD-10-CM | POA: Diagnosis not present

## 2016-08-08 DIAGNOSIS — Z96652 Presence of left artificial knee joint: Secondary | ICD-10-CM | POA: Diagnosis not present

## 2016-08-10 ENCOUNTER — Encounter: Payer: Self-pay | Admitting: Podiatry

## 2016-08-10 ENCOUNTER — Ambulatory Visit (INDEPENDENT_AMBULATORY_CARE_PROVIDER_SITE_OTHER): Payer: Medicare Other | Admitting: Podiatry

## 2016-08-10 DIAGNOSIS — M79676 Pain in unspecified toe(s): Secondary | ICD-10-CM

## 2016-08-10 DIAGNOSIS — L608 Other nail disorders: Secondary | ICD-10-CM

## 2016-08-10 DIAGNOSIS — B351 Tinea unguium: Secondary | ICD-10-CM

## 2016-08-10 DIAGNOSIS — L603 Nail dystrophy: Secondary | ICD-10-CM

## 2016-08-13 DIAGNOSIS — Z96652 Presence of left artificial knee joint: Secondary | ICD-10-CM | POA: Diagnosis not present

## 2016-08-15 DIAGNOSIS — Z96652 Presence of left artificial knee joint: Secondary | ICD-10-CM | POA: Diagnosis not present

## 2016-08-20 DIAGNOSIS — Z96652 Presence of left artificial knee joint: Secondary | ICD-10-CM | POA: Diagnosis not present

## 2016-08-20 DIAGNOSIS — G8929 Other chronic pain: Secondary | ICD-10-CM | POA: Diagnosis not present

## 2016-08-20 DIAGNOSIS — M6281 Muscle weakness (generalized): Secondary | ICD-10-CM | POA: Diagnosis not present

## 2016-08-20 DIAGNOSIS — M25662 Stiffness of left knee, not elsewhere classified: Secondary | ICD-10-CM | POA: Diagnosis not present

## 2016-08-20 DIAGNOSIS — M25562 Pain in left knee: Secondary | ICD-10-CM | POA: Diagnosis not present

## 2016-08-22 DIAGNOSIS — Z96652 Presence of left artificial knee joint: Secondary | ICD-10-CM | POA: Diagnosis not present

## 2016-08-26 NOTE — Progress Notes (Signed)
SUBJECTIVE Patient  presents to office today complaining of elongated, thickened nails. Pain while ambulating in shoes. Patient is unable to trim their own nails.   OBJECTIVE General Patient is awake, alert, and oriented x 3 and in no acute distress. Derm Skin is dry and supple bilateral. Negative open lesions or macerations. Remaining integument unremarkable. Nails are tender, long, thickened and dystrophic with subungual debris, consistent with onychomycosis, 1-5 bilateral. No signs of infection noted. Vasc  DP and PT pedal pulses palpable bilaterally. Temperature gradient within normal limits.  Neuro Epicritic and protective threshold sensation diminished bilaterally.  Musculoskeletal Exam No symptomatic pedal deformities noted bilateral. Muscular strength within normal limits.  ASSESSMENT 1. Onychodystrophic nails 1-5 bilateral with hyperkeratosis of nails.  2. Onychomycosis of nail due to dermatophyte bilateral 3. Pain in foot bilateral  PLAN OF CARE 1. Patient evaluated today.  2. Instructed to maintain good pedal hygiene and foot care.  3. Mechanical debridement of nails 1-5 bilaterally performed using a nail nipper. Filed with dremel without incident.  4. Return to clinic in 3 mos.    Dushawn Pusey M Leida [place], DPM    

## 2016-08-31 ENCOUNTER — Telehealth: Payer: Self-pay

## 2016-08-31 DIAGNOSIS — E039 Hypothyroidism, unspecified: Secondary | ICD-10-CM

## 2016-08-31 DIAGNOSIS — D649 Anemia, unspecified: Secondary | ICD-10-CM

## 2016-08-31 DIAGNOSIS — R739 Hyperglycemia, unspecified: Secondary | ICD-10-CM

## 2016-08-31 DIAGNOSIS — I10 Essential (primary) hypertension: Secondary | ICD-10-CM

## 2016-08-31 DIAGNOSIS — E7800 Pure hypercholesterolemia, unspecified: Secondary | ICD-10-CM

## 2016-08-31 NOTE — Telephone Encounter (Signed)
Pt coming for fasting labs 09/03/16. Please place future orders. Thank you.

## 2016-09-01 NOTE — Telephone Encounter (Signed)
Order placed for labs.

## 2016-09-03 ENCOUNTER — Other Ambulatory Visit (INDEPENDENT_AMBULATORY_CARE_PROVIDER_SITE_OTHER): Payer: Medicare Other

## 2016-09-03 DIAGNOSIS — E039 Hypothyroidism, unspecified: Secondary | ICD-10-CM | POA: Diagnosis not present

## 2016-09-03 DIAGNOSIS — I10 Essential (primary) hypertension: Secondary | ICD-10-CM

## 2016-09-03 DIAGNOSIS — R739 Hyperglycemia, unspecified: Secondary | ICD-10-CM | POA: Diagnosis not present

## 2016-09-03 DIAGNOSIS — D649 Anemia, unspecified: Secondary | ICD-10-CM

## 2016-09-03 DIAGNOSIS — E7800 Pure hypercholesterolemia, unspecified: Secondary | ICD-10-CM

## 2016-09-03 LAB — LIPID PANEL
CHOL/HDL RATIO: 3
Cholesterol: 140 mg/dL (ref 0–200)
HDL: 47.1 mg/dL (ref >39.00)
LDL CALC: 69 mg/dL (ref 0–99)
NONHDL: 92.47
Triglycerides: 118 mg/dL (ref 0.0–149.0)
VLDL: 23.6 mg/dL (ref 0.0–40.0)

## 2016-09-03 LAB — CBC WITH DIFFERENTIAL/PLATELET
Basophils Absolute: 0 10*3/uL (ref 0.0–0.1)
Basophils Relative: 1 % (ref 0.0–3.0)
EOS PCT: 7.5 % — AB (ref 0.0–5.0)
Eosinophils Absolute: 0.4 10*3/uL (ref 0.0–0.7)
HCT: 32 % — ABNORMAL LOW (ref 39.0–52.0)
HEMOGLOBIN: 10.5 g/dL — AB (ref 13.0–17.0)
Lymphocytes Relative: 29 % (ref 12.0–46.0)
Lymphs Abs: 1.5 10*3/uL (ref 0.7–4.0)
MCHC: 32.7 g/dL (ref 30.0–36.0)
MCV: 76.7 fl — ABNORMAL LOW (ref 78.0–100.0)
MONO ABS: 0.5 10*3/uL (ref 0.1–1.0)
Monocytes Relative: 10.5 % (ref 3.0–12.0)
Neutro Abs: 2.6 10*3/uL (ref 1.4–7.7)
Neutrophils Relative %: 52 % (ref 43.0–77.0)
Platelets: 318 10*3/uL (ref 150.0–400.0)
RBC: 4.18 Mil/uL — AB (ref 4.22–5.81)
RDW: 14.8 % (ref 11.5–15.5)
WBC: 5.1 10*3/uL (ref 4.0–10.5)

## 2016-09-03 LAB — HEPATIC FUNCTION PANEL
ALK PHOS: 95 U/L (ref 39–117)
ALT: 7 U/L (ref 0–53)
AST: 12 U/L (ref 0–37)
Albumin: 4.3 g/dL (ref 3.5–5.2)
BILIRUBIN DIRECT: 0.1 mg/dL (ref 0.0–0.3)
BILIRUBIN TOTAL: 0.7 mg/dL (ref 0.2–1.2)
TOTAL PROTEIN: 7 g/dL (ref 6.0–8.3)

## 2016-09-03 LAB — BASIC METABOLIC PANEL
BUN: 13 mg/dL (ref 6–23)
CALCIUM: 9.5 mg/dL (ref 8.4–10.5)
CO2: 28 mEq/L (ref 19–32)
CREATININE: 0.92 mg/dL (ref 0.40–1.50)
Chloride: 101 mEq/L (ref 96–112)
GFR: 84.95 mL/min (ref >60.00)
Glucose, Bld: 107 mg/dL — ABNORMAL HIGH (ref 70–99)
POTASSIUM: 4.7 meq/L (ref 3.5–5.1)
Sodium: 137 mEq/L (ref 135–145)

## 2016-09-03 LAB — FERRITIN: FERRITIN: 17.7 ng/mL — AB (ref 22.0–322.0)

## 2016-09-03 LAB — HEMOGLOBIN A1C: Hgb A1c MFr Bld: 5.8 % (ref 4.6–6.5)

## 2016-09-03 LAB — TSH: TSH: 3.44 u[IU]/mL (ref 0.35–4.50)

## 2016-09-07 ENCOUNTER — Encounter: Payer: Self-pay | Admitting: Internal Medicine

## 2016-09-07 ENCOUNTER — Other Ambulatory Visit: Payer: Self-pay | Admitting: Internal Medicine

## 2016-09-07 ENCOUNTER — Ambulatory Visit (INDEPENDENT_AMBULATORY_CARE_PROVIDER_SITE_OTHER): Payer: Medicare Other | Admitting: Internal Medicine

## 2016-09-07 VITALS — BP 132/84 | HR 73 | Temp 98.2°F | Ht 68.5 in | Wt 204.4 lb

## 2016-09-07 DIAGNOSIS — I2510 Atherosclerotic heart disease of native coronary artery without angina pectoris: Secondary | ICD-10-CM | POA: Diagnosis not present

## 2016-09-07 DIAGNOSIS — I6529 Occlusion and stenosis of unspecified carotid artery: Secondary | ICD-10-CM | POA: Diagnosis not present

## 2016-09-07 DIAGNOSIS — E7800 Pure hypercholesterolemia, unspecified: Secondary | ICD-10-CM

## 2016-09-07 DIAGNOSIS — D649 Anemia, unspecified: Secondary | ICD-10-CM | POA: Diagnosis not present

## 2016-09-07 DIAGNOSIS — E039 Hypothyroidism, unspecified: Secondary | ICD-10-CM

## 2016-09-07 DIAGNOSIS — I10 Essential (primary) hypertension: Secondary | ICD-10-CM

## 2016-09-07 DIAGNOSIS — Z0000 Encounter for general adult medical examination without abnormal findings: Secondary | ICD-10-CM

## 2016-09-07 DIAGNOSIS — R739 Hyperglycemia, unspecified: Secondary | ICD-10-CM

## 2016-09-07 DIAGNOSIS — Z9109 Other allergy status, other than to drugs and biological substances: Secondary | ICD-10-CM

## 2016-09-07 DIAGNOSIS — M170 Bilateral primary osteoarthritis of knee: Secondary | ICD-10-CM | POA: Diagnosis not present

## 2016-09-07 MED ORDER — METOPROLOL TARTRATE 25 MG PO TABS: 25.0000 mg | ORAL_TABLET | Freq: Two times a day (BID) | ORAL | 3 refills | Status: DC

## 2016-09-07 MED ORDER — LORAZEPAM 1 MG PO TABS: ORAL_TABLET | ORAL | 1 refills | Status: DC

## 2016-09-07 NOTE — Progress Notes (Signed)
Patient ID: Daniel MANCINAS, male   DOB: [birth year], 75 y.o.   MRN: [id number]   Subjective:    Patient ID: Daniel Austin, male    DOB: [birth year], 75 y.o.   MRN: [id number]  HPI  Patient with past history of CAD, carotid artery stenosis, CHF and depression.  He comes in today to follow up on these issues as well as for a complete exam.  Just had his second knee surgery.  Did well.  Knees are doing well. No chest pain.  No sob.  No acid reflux.  No abdominal pain or cramping.  Has started sneezing.  Has a runny nose.  No sinus pressure.  No chest congestion.  No increased cough.     Past Medical History:  Diagnosis Date  . Anemia   . Anxiety   . Arthritis   . CAD (coronary artery disease)   . Cardiomyopathy ([place])   . Carotid artery stenosis    high-grade symptomatic right sided s/p stent twice  . CHF (congestive heart failure) (H[facility])    chronic movement fluid and history of CHF he noticed in the   . Depression   . Fractures, multiple    Metal rod in left foot  . GERD (gastroesophageal reflux disease)   . Heart block   . Hypercholesterolemia   . Hyperglycemia   . Hypertension   . Hypothyroidism   . Knee pain   . Presence of permanent cardiac pacemaker   . Shortness of breath dyspnea   . Stroke [facility])    clot in neck broke off left side weakness 2008  resolved  . Syncope    second degree heart block s/p [employer]'s pacemaker placement 12/08 with his history is limited with any  . Ulcer of gastric fundus    Past Surgical History:  Procedure Laterality Date  . CARDIAC CATHETERIZATION    . CORONARY ANGIOPLASTY     stents x 3  . heart stint  11/04/2014   [facility]  . HERNIA REPAIR     inguinal  . INSERT / REPLACE / REMOVE PACEMAKER    . JOINT REPLACEMENT     right knee  . KNEE ARTHROPLASTY Right 02/08/2016   Procedure: COMPUTER ASSISTED TOTAL KNEE ARTHROPLASTY;  Surgeon: Dereck Leep, MD;  Location: [facility] ORS;  Service: Orthopedics;  Laterality: Right;  . KNEE  ARTHROPLASTY Left 06/27/2016   Procedure: COMPUTER ASSISTED TOTAL KNEE ARTHROPLASTY;  Surgeon: Dereck Leep, MD;  Location: [facility] ORS;  Service: Orthopedics;  Laterality: Left;  . PACEMAKER INSERTION  08/2007  . PACEMAKER INSERTION N/A 11/16/2015   Procedure: INSERTION PACEMAKER/ PACEMAKER CHANGE OUT;  Surgeon: Isaias Cowman, MD;  Location: [facility] ORS;  Service: Cardiovascular;  Laterality: N/A;  . PERCUTANEOUS PLACEMENT INTRAVASCULAR STENT CERVICAL CAROTID ARTERY     twice, right carotid artery   Family History  Problem Relation Age of Onset  . Heart disease Mother     CHF  . Heart disease Father     CHF  . Thyroid disease Father     hypothyroidism  . Colon polyps Father   . Diabetes Sister     diet controlled  . Thyroid disease Sister   . Thyroid disease Sister   . Colon cancer Neg Hx   . Prostate cancer Neg Hx    Social History   Social History  . Marital status: Married    Spouse name: N/A  . Number of children: 1  . Years of education: N/A  Social History Main Topics  . Smoking status: Never Smoker  . Smokeless tobacco: Never Used  . Alcohol use No  . Drug use: No  . Sexual activity: Not Currently   Other Topics Concern  . None   Social History Narrative  . None    Outpatient Encounter Prescriptions as of 09/07/2016  Medication Sig  . acetaminophen (TYLENOL) 500 MG tablet Take 1,000 mg by mouth every 6 (six) hours as needed.  . Aspirin 81 MG EC tablet Take 81 mg by mouth daily.  . citalopram (CELEXA) 20 MG tablet TAKE 1 TABLET (20 MG TOTAL) BY MOUTH DAILY.  Marland Kitchen clopidogrel (PLAVIX) 75 MG tablet Take 1 tablet (75 mg total) by mouth daily.  . CRESTOR 20 MG tablet TAKE 1 TABLET (20 MG TOTAL) BY MOUTH NIGHTLY.  . fexofenadine (ALLEGRA) 180 MG tablet Take 180 mg by mouth daily. As needed  . levothyroxine (SYNTHROID, LEVOTHROID) 112 MCG tablet TAKE 1 TABLET (112 MCG TOTAL) BY MOUTH DAILY.  Marland Kitchen lisinopril (PRINIVIL,ZESTRIL) 40 MG tablet Take 40 mg by mouth daily.    Marland Kitchen LORazepam (ATIVAN) 1 MG tablet TAKE 1 TABLET BY MOUTH AT BEDTIME AS NEEDED FOR ANXIETY  . metoprolol tartrate (LOPRESSOR) 25 MG tablet Take 1 tablet (25 mg total) by mouth 2 (two) times daily.  . nitroGLYCERIN (NITROSTAT) 0.4 MG SL tablet Place 0.4 mg under the tongue every 5 (five) minutes as needed. If a third tablet is needed please call 911.  . pantoprazole (PROTONIX) 40 MG tablet TAKE 1 TABLET (40 MG TOTAL) BY MOUTH DAILY.  . [DISCONTINUED] LORazepam (ATIVAN) 1 MG tablet TAKE 1 TABLET BY MOUTH AT BEDTIME AS NEEDED FOR ANXIETY  . [DISCONTINUED] metoprolol tartrate (LOPRESSOR) 25 MG tablet TAKE 1 TABLET BY MOUTH TWICE A DAY  . [DISCONTINUED] traMADol (ULTRAM) 50 MG tablet Take 1-2 tablets (50-100 mg total) by mouth every 4 (four) hours as needed for moderate pain.   No facility-administered encounter medications on file as of 09/07/2016.     Review of Systems  Constitutional: Negative for appetite change and unexpected weight change.  HENT: Negative for sinus pressure and sore throat.        Runny nose and sneezing.    Eyes: Negative for pain and visual disturbance.  Respiratory: Negative for cough, chest tightness and shortness of breath.   Cardiovascular: Negative for chest pain, palpitations and leg swelling.  Gastrointestinal: Negative for abdominal pain, diarrhea, nausea and vomiting.  Genitourinary: Negative for difficulty urinating and dysuria.  Musculoskeletal: Negative for back pain and joint swelling.       Knees doing better.   Skin: Negative for color change and rash.  Neurological: Negative for dizziness, light-headedness and headaches.  Hematological: Negative for adenopathy. Does not bruise/bleed easily.  Psychiatric/Behavioral: Negative for agitation and dysphoric mood.       Objective:    Physical Exam  Constitutional: He is oriented to person, place, and time. He appears well-developed and well-nourished. No distress.  HENT:  Head: Normocephalic and  atraumatic.  Nose: Nose normal.  Mouth/Throat: Oropharynx is clear and moist. No oropharyngeal exudate.  Eyes: Conjunctivae are normal. Right eye exhibits no discharge. Left eye exhibits no discharge.  Neck: Neck supple. No thyromegaly present.  Cardiovascular: Normal rate and regular rhythm.   Pulmonary/Chest: Breath sounds normal. No respiratory distress. He has no wheezes.  Abdominal: Soft. Bowel sounds are normal. There is no tenderness.  Genitourinary:  Genitourinary Comments: Rectal - no palpable prostate nodule.  Heme negative.  Musculoskeletal: He exhibits no edema or tenderness.  Lymphadenopathy:    He has no cervical adenopathy.  Neurological: He is alert and oriented to person, place, and time.  Skin: Skin is warm and dry. No rash noted. No erythema.  Psychiatric: He has a normal mood and affect. His behavior is normal.    BP 132/84   Pulse 73   Temp 98.2 F (36.8 C) (Oral)   Ht 5' 8.5" (1.74 m)   Wt 204 lb 6.4 oz (92.7 kg)   SpO2 98%   BMI 30.63 kg/m  Wt Readings from Last 3 Encounters:  09/07/16 204 lb 6.4 oz (92.7 kg)  06/27/16 199 lb (90.3 kg)  06/11/16 200 lb (90.7 kg)     Lab Results  Component Value Date   WBC 5.1 09/03/2016   HGB 10.5 (L) 09/03/2016   HCT 32.0 (L) 09/03/2016   PLT 318.0 09/03/2016   GLUCOSE 107 (H) 09/03/2016   CHOL 140 09/03/2016   TRIG 118.0 09/03/2016   HDL 47.10 09/03/2016   LDLCALC 69 09/03/2016   ALT 7 09/03/2016   AST 12 09/03/2016   NA 137 09/03/2016   K 4.7 09/03/2016   CL 101 09/03/2016   CREATININE 0.92 09/03/2016   BUN 13 09/03/2016   CO2 28 09/03/2016   TSH 3.44 09/03/2016   PSA 0.33 08/26/2015   INR 1.13 06/11/2016   HGBA1C 5.8 09/03/2016   MICROALBUR 1.6 05/25/2014       Assessment & Plan:   Problem List Items Addressed This Visit    Anemia - Primary    Had EGD and colonoscopy in 2012 as outlined.  hgb decreased after surgery.  Iron stores low.  Start iron supplements.  Discussed with him today.   Recheck cbc and ferritin in 4 weeks.        Relevant Orders   CBC with Differential/Platelet   Ferritin   Carotid artery stenosis    S/p stent x 2.  Continue aspirin and plavix.  Continue f/u with vascular surgery.        Relevant Medications   metoprolol tartrate (LOPRESSOR) 25 MG tablet   Coronary atherosclerosis of native coronary artery    Followed by cardiology.  On aspirin and plavix.  Doing well.  Continue risk factor modification.        Relevant Medications   metoprolol tartrate (LOPRESSOR) 25 MG tablet   Essential hypertension, benign    Blood pressure under good control.  Continue same medication regimen.  Follow pressures.  Follow metabolic panel.        Relevant Medications   metoprolol tartrate (LOPRESSOR) 25 MG tablet   Health care maintenance    Physical today 09/07/16.  Check psa.  Last 08/26/15 - .33.  Colonoscopy 2012.        Hyperglycemia    Low carb diet and exercise.  Follow met b and a1c.   Lab Results  Component Value Date   HGBA1C 5.8 09/03/2016        Hypothyroidism    On thyroid replacement.  Follow tsh.       Relevant Medications   metoprolol tartrate (LOPRESSOR) 25 MG tablet   Primary osteoarthritis of both knees    S/p knee surgery - now on both knees.  Doing better. Walking better.  Pain better.  Continue f/u with ortho.        Pure hypercholesterolemia    On crestor.  Low cholesterol diet and exercise.  Follow lipid panel and liver function tests.  Lab Results  Component Value Date   CHOL 140 09/03/2016   HDL 47.10 09/03/2016   LDLCALC 69 09/03/2016   TRIG 118.0 09/03/2016   CHOLHDL 3 09/03/2016        Relevant Medications   metoprolol tartrate (LOPRESSOR) 25 MG tablet    Other Visit Diagnoses    Environmental allergies       sneezing and runny nose as outlined.  saline and nasacort as directed.  follow.         Einar Pheasant, MD

## 2016-09-07 NOTE — Progress Notes (Signed)
Pre visit review using our clinic review tool, if applicable. No additional management support is needed unless otherwise documented below in the visit note. 

## 2016-09-07 NOTE — Telephone Encounter (Signed)
Please advise. You spoke with patient about this during appointment.

## 2016-09-07 NOTE — Assessment & Plan Note (Addendum)
Physical today 09/07/16.  Check psa.  Last 08/26/15 - .33.  Colonoscopy 2012.

## 2016-09-08 NOTE — Telephone Encounter (Signed)
Please confirm with pt that he is still taking the lisinopril.  Per note review when he went to [facility] for rehab after his previous surgery, they stopped the lisinopril.  It appears his last visit to cardiology, he was taking and doing fine.  To my knowledge is still taking.  If he confirms he is still taking daily, ok to refill x 6.  Thanks.

## 2016-09-09 ENCOUNTER — Encounter: Payer: Self-pay | Admitting: Internal Medicine

## 2016-09-09 NOTE — Assessment & Plan Note (Signed)
Had EGD and colonoscopy in 2012 as outlined.  hgb decreased after surgery.  Iron stores low.  Start iron supplements.  Discussed with him today.  Recheck cbc and ferritin in 4 weeks.

## 2016-09-09 NOTE — Assessment & Plan Note (Signed)
Low carb diet and exercise.  Follow met b and a1c.   Lab Results  Component Value Date   HGBA1C 5.8 09/03/2016

## 2016-09-09 NOTE — Assessment & Plan Note (Signed)
On crestor.  Low cholesterol diet and exercise.  Follow lipid panel and liver function tests.   Lab Results  Component Value Date   CHOL 140 09/03/2016   HDL 47.10 09/03/2016   LDLCALC 69 09/03/2016   TRIG 118.0 09/03/2016   CHOLHDL 3 09/03/2016

## 2016-09-09 NOTE — Assessment & Plan Note (Signed)
S/p stent x 2.  Continue aspirin and plavix.  Continue f/u with vascular surgery.

## 2016-09-09 NOTE — Assessment & Plan Note (Signed)
On thyroid replacement.  Follow tsh.  

## 2016-09-09 NOTE — Assessment & Plan Note (Signed)
Blood pressure under good control.  Continue same medication regimen.  Follow pressures.  Follow metabolic panel.   

## 2016-09-09 NOTE — Assessment & Plan Note (Signed)
S/p knee surgery - now on both knees.  Doing better. Walking better.  Pain better.  Continue f/u with ortho.

## 2016-09-09 NOTE — Assessment & Plan Note (Signed)
Followed by cardiology.  On aspirin and plavix.  Doing well.  Continue risk factor modification.

## 2016-09-11 ENCOUNTER — Telehealth: Payer: Self-pay | Admitting: Internal Medicine

## 2016-09-11 ENCOUNTER — Ambulatory Visit (INDEPENDENT_AMBULATORY_CARE_PROVIDER_SITE_OTHER): Payer: Medicare Other

## 2016-09-11 VITALS — BP 130/70 | HR 72 | Temp 97.9°F | Resp 12 | Ht 68.5 in | Wt 205.1 lb

## 2016-09-11 DIAGNOSIS — Z0000 Encounter for general adult medical examination without abnormal findings: Secondary | ICD-10-CM | POA: Diagnosis not present

## 2016-09-11 DIAGNOSIS — Z471 Aftercare following joint replacement surgery: Secondary | ICD-10-CM | POA: Diagnosis not present

## 2016-09-11 NOTE — Telephone Encounter (Signed)
Pt called back and stated that he has been taking this medication since his knee surgery.

## 2016-09-11 NOTE — Patient Instructions (Addendum)
Daniel Austin , Thank you for taking time to come for your Medicare Wellness Visit. I appreciate your ongoing commitment to your health goals. Please review the following plan we discussed and let me know if I can assist you in the future.   Follow up with Dr. Nicki Reaper as needed.  Merry Christmas!!  These are the goals we discussed: Goals    . Increase physical activity          Silver sneaker program 3 days a week for 30-45 minutes (water aerobics or structured program)       This is a list of the screening recommended for you and due dates:  Health Maintenance  Topic Date Due  . Shingles Vaccine  05/17/2000  . Flu Shot  09/09/2017*  . Tetanus Vaccine  09/23/2017*  . Pneumonia vaccines (1 of 2 - PCV13) 09/23/2017*  *Topic was postponed. The date shown is not the original due date.      Fall Prevention in the Home Introduction Falls can cause injuries. They can happen to people of all ages. There are many things you can do to make your home safe and to help prevent falls. What can I do on the outside of my home?  Regularly fix the edges of walkways and driveways and fix any cracks.  Remove anything that might make you trip as you walk through a door, such as a raised step or threshold.  Trim any bushes or trees on the path to your home.  Use bright outdoor lighting.  Clear any walking paths of anything that might make someone trip, such as rocks or tools.  Regularly check to see if handrails are loose or broken. Make sure that both sides of any steps have handrails.  Any raised decks and porches should have guardrails on the edges.  Have any leaves, snow, or ice cleared regularly.  Use sand or salt on walking paths during winter.  Clean up any spills in your garage right away. This includes oil or grease spills. What can I do in the bathroom?  Use night lights.  Install grab bars by the toilet and in the tub and shower. Do not use towel bars as grab bars.  Use  non-skid mats or decals in the tub or shower.  If you need to sit down in the shower, use a plastic, non-slip stool.  Keep the floor dry. Clean up any water that spills on the floor as soon as it happens.  Remove soap buildup in the tub or shower regularly.  Attach bath mats securely with double-sided non-slip rug tape.  Do not have throw rugs and other things on the floor that can make you trip. What can I do in the bedroom?  Use night lights.  Make sure that you have a light by your bed that is easy to reach.  Do not use any sheets or blankets that are too big for your bed. They should not hang down onto the floor.  Have a firm chair that has side arms. You can use this for support while you get dressed.  Do not have throw rugs and other things on the floor that can make you trip. What can I do in the kitchen?  Clean up any spills right away.  Avoid walking on wet floors.  Keep items that you use a lot in easy-to-reach places.  If you need to reach something above you, use a strong step stool that has a grab bar.  Keep  electrical cords out of the way.  Do not use floor polish or wax that makes floors slippery. If you must use wax, use non-skid floor wax.  Do not have throw rugs and other things on the floor that can make you trip. What can I do with my stairs?  Do not leave any items on the stairs.  Make sure that there are handrails on both sides of the stairs and use them. Fix handrails that are broken or loose. Make sure that handrails are as long as the stairways.  Check any carpeting to make sure that it is firmly attached to the stairs. Fix any carpet that is loose or worn.  Avoid having throw rugs at the top or bottom of the stairs. If you do have throw rugs, attach them to the floor with carpet tape.  Make sure that you have a light switch at the top of the stairs and the bottom of the stairs. If you do not have them, ask someone to add them for you. What  else can I do to help prevent falls?  Wear shoes that:  Do not have high heels.  Have rubber bottoms.  Are comfortable and fit you well.  Are closed at the toe. Do not wear sandals.  If you use a stepladder:  Make sure that it is fully opened. Do not climb a closed stepladder.  Make sure that both sides of the stepladder are locked into place.  Ask someone to hold it for you, if possible.  Clearly mark and make sure that you can see:  Any grab bars or handrails.  First and last steps.  Where the edge of each step is.  Use tools that help you move around (mobility aids) if they are needed. These include:  Canes.  Walkers.  Scooters.  Crutches.  Turn on the lights when you go into a dark area. Replace any light bulbs as soon as they burn out.  Set up your furniture so you have a clear path. Avoid moving your furniture around.  If any of your floors are uneven, fix them.  If there are any pets around you, be aware of where they are.  Review your medicines with your doctor. Some medicines can make you feel dizzy. This can increase your chance of falling. Ask your doctor what other things that you can do to help prevent falls. This information is not intended to replace advice given to you by your health care provider. Make sure you discuss any questions you have with your health care provider. Document Released: 07/07/2009 Document Revised: 02/16/2016 Document Reviewed: 10/15/2014  2017 [employer]

## 2016-09-11 NOTE — Telephone Encounter (Signed)
Pt called requesting a refill on his lisinopril (PRINIVIL,ZESTRIL) 40 MG tablet. Please advise, thank you!  Call pt @ [phone]  Pharmacy - [id number] - [place], [address]

## 2016-09-11 NOTE — Progress Notes (Signed)
Subjective:   Daniel Austin is a 75 y.o. male who presents for an Initial Medicare Annual Wellness Visit.  Review of Systems  No ROS.  Medicare Wellness Visit.  Cardiac Risk Factors include: advanced age (>38men, >31 women);hypertension;obesity (BMI >30kg/m2)    Objective:    Today's Vitals   09/11/16 0824  BP: 130/70  Pulse: 72  Resp: 12  Temp: 97.9 F (36.6 C)  TempSrc: Oral  SpO2: 97%  Weight: 205 lb 1.9 oz (93 kg)  Height: 5' 8.5" (1.74 m)   Body mass index is 30.73 kg/m.  Current Medications (verified) Outpatient Encounter Prescriptions as of 09/11/2016  Medication Sig  . acetaminophen (TYLENOL) 500 MG tablet Take 1,000 mg by mouth every 6 (six) hours as needed.  . Aspirin 81 MG EC tablet Take 81 mg by mouth daily.  . citalopram (CELEXA) 20 MG tablet TAKE 1 TABLET (20 MG TOTAL) BY MOUTH DAILY.  Marland Kitchen clopidogrel (PLAVIX) 75 MG tablet Take 1 tablet (75 mg total) by mouth daily.  . CRESTOR 20 MG tablet TAKE 1 TABLET (20 MG TOTAL) BY MOUTH NIGHTLY.  . fexofenadine (ALLEGRA) 180 MG tablet Take 180 mg by mouth daily. As needed  . levothyroxine (SYNTHROID, LEVOTHROID) 112 MCG tablet TAKE 1 TABLET (112 MCG TOTAL) BY MOUTH DAILY.  Marland Kitchen lisinopril (PRINIVIL,ZESTRIL) 40 MG tablet Take 40 mg by mouth daily.  Marland Kitchen LORazepam (ATIVAN) 1 MG tablet TAKE 1 TABLET BY MOUTH AT BEDTIME AS NEEDED FOR ANXIETY  . metoprolol tartrate (LOPRESSOR) 25 MG tablet Take 1 tablet (25 mg total) by mouth 2 (two) times daily.  . nitroGLYCERIN (NITROSTAT) 0.4 MG SL tablet Place 0.4 mg under the tongue every 5 (five) minutes as needed. If a third tablet is needed please call 911.  . pantoprazole (PROTONIX) 40 MG tablet TAKE 1 TABLET (40 MG TOTAL) BY MOUTH DAILY.   No facility-administered encounter medications on file as of 09/11/2016.     Allergies (verified) Beta adrenergic blockers and Lipitor [atorvastatin]   History: Past Medical History:  Diagnosis Date  . Anemia   . Anxiety   . Arthritis     . CAD (coronary artery disease)   . Cardiomyopathy ([place])   . Carotid artery stenosis    high-grade symptomatic right sided s/p stent twice  . CHF (congestive heart failure) (HCC)    chronic movement fluid and history of CHF he noticed in the   . Depression   . Fractures, multiple    Metal rod in left foot  . GERD (gastroesophageal reflux disease)   . Heart block   . Hypercholesterolemia   . Hyperglycemia   . Hypertension   . Hypothyroidism   . Knee pain   . Presence of permanent cardiac pacemaker   . Shortness of breath dyspnea   . Stroke [facility])    clot in neck broke off left side weakness 2008  resolved  . Syncope    second degree heart block s/p St Jude's pacemaker placement 12/08 with his history is limited with any  . Ulcer of gastric fundus    Past Surgical History:  Procedure Laterality Date  . CARDIAC CATHETERIZATION    . CORONARY ANGIOPLASTY     stents x 3  . heart stint  11/04/2014   [facility]  . HERNIA REPAIR     inguinal  . INSERT / REPLACE / REMOVE PACEMAKER    . JOINT REPLACEMENT     right knee  . KNEE ARTHROPLASTY Right 02/08/2016   Procedure:  COMPUTER ASSISTED TOTAL KNEE ARTHROPLASTY;  Surgeon: Dereck Leep, MD;  Location: [facility] ORS;  Service: Orthopedics;  Laterality: Right;  . KNEE ARTHROPLASTY Left 06/27/2016   Procedure: COMPUTER ASSISTED TOTAL KNEE ARTHROPLASTY;  Surgeon: Dereck Leep, MD;  Location: [facility] ORS;  Service: Orthopedics;  Laterality: Left;  . PACEMAKER INSERTION  08/2007  . PACEMAKER INSERTION N/A 11/16/2015   Procedure: INSERTION PACEMAKER/ PACEMAKER CHANGE OUT;  Surgeon: Isaias Cowman, MD;  Location: [facility] ORS;  Service: Cardiovascular;  Laterality: N/A;  . PERCUTANEOUS PLACEMENT INTRAVASCULAR STENT CERVICAL CAROTID ARTERY     twice, right carotid artery   Family History  Problem Relation Age of Onset  . Heart disease Mother     CHF  . Heart disease Father     CHF  . Thyroid disease Father     hypothyroidism  . Colon  polyps Father   . Diabetes Sister     diet controlled  . Thyroid disease Sister   . Cancer Sister     breast   . Thyroid disease Sister   . Cancer Sister     breast  . Colon cancer Neg Hx   . Prostate cancer Neg Hx    Social History   Occupational History  . Not on file.   Social History Main Topics  . Smoking status: Never Smoker  . Smokeless tobacco: Never Used  . Alcohol use No  . Drug use: No  . Sexual activity: Not Currently   Tobacco Counseling Counseling given: Not Answered   Activities of Daily Living In your present state of health, do you have any difficulty performing the following activities: 09/11/2016 06/27/2016  Hearing? N N  Vision? N N  Difficulty concentrating or making decisions? N N  Walking or climbing stairs? N Y  Dressing or bathing? N N  Doing errands, shopping? N Y  Conservation officer, nature and eating ? N -  Using the Toilet? N -  In the past six months, have you accidently leaked urine? N -  Do you have problems with loss of bowel control? N -  Managing your Medications? N -  Managing your Finances? N -  Housekeeping or managing your Housekeeping? N -  Some recent data might be hidden    Immunizations and Health Maintenance Immunization History  Administered Date(s) Administered  . DTaP 11/04/2006  . Influenza Nasal 09/03/2012, 07/06/2013  . Influenza,inj,Quad PF,36+ Mos 05/28/2014, 05/25/2015   Health Maintenance Due  Topic Date Due  . ZOSTAVAX  05/17/2000    Patient Care Team: Einar Pheasant, MD as PCP - General (Internal Medicine)  Indicate any recent Medical Services you may have received from other than [facility] providers in the past year (date may be approximate).    Assessment:   This is a routine wellness examination for [place]. The goal of the wellness visit is to assist the patient how to close the gaps in care and create a preventative care plan for the patient.   Osteoporosis reviewed.  Medications reviewed; taking without  issues or barriers.  Safety issues reviewed; lives with wife.  Smoke detectors in the home. No firearms in the home. Wears seatbelts when driving or riding with others. No violence in the home.  No identified risk were noted; The patient was oriented x 3; appropriate in dress and manner and no objective failures at ADL's or IADL's.   BMI; discussed the importance of a healthy diet, water intake and exercise. Educational material provided.  HTN; followed by PCP.  Patient Concerns: None at this time. Follow up with PCP as needed.  Hearing/Vision screen Hearing Screening Comments: Passes the whisper test Vision Screening Comments: Followed by [facility] Wears glasses Last OV 2016 Vision acuity not assesed per patient preference since he has regular follow up with his ophthalmologist.  Dietary issues and exercise activities discussed: Current Exercise Habits: Home exercise routine (Physical therapy home exercises), Type of exercise: walking;calisthenics, Frequency (Times/Week): 3, Intensity: Mild  Goals    . Increase physical activity          Silver sneaker program 3 days a week for 30-45 minutes (water aerobics or structured program)      Depression Screen PHQ 2/9 Scores 09/11/2016 09/07/2016 03/11/2015 01/07/2015  PHQ - 2 Score 1 1 0 4  PHQ- 9 Score - - 0 16    Fall Risk Fall Risk  09/11/2016 09/07/2016 01/07/2015 01/04/2014 09/10/2013  Falls in the past year? No No No No No    Cognitive Function:     6CIT Screen 09/11/2016  What Year? 0 points  What month? 0 points  What time? 0 points  Count back from 20 0 points  Months in reverse 0 points    Screening Tests Health Maintenance  Topic Date Due  . ZOSTAVAX  05/17/2000  . INFLUENZA VACCINE  09/09/2017 (Originally 04/24/2016)  . TETANUS/TDAP  09/23/2017 (Originally 05/18/1960)  . PNA vac Low Risk Adult (1 of 2 - PCV13) 09/23/2017 (Originally 05/17/2005)        Plan:    End of life planning; Advance  aging; Advanced directives discussed. No HCPOA/Living Will.  Additional information declined at this time.  Medicare Attestation I have personally reviewed: The patient's medical and social history Their use of alcohol, tobacco or illicit drugs Their current medications and supplements The patient's functional ability including ADLs,fall risks, home safety risks, cognitive, and hearing and visual impairment Diet and physical activities Evidence for depression   The patient's weight, height, BMI, and visual acuity have been recorded in the chart.  I have made referrals and provided education to the patient based on review of the above and I have provided the patient with a written personalized care plan for preventive services.    During the course of the visit [place] was educated and counseled about the following appropriate screening and preventive services:   Vaccines to include Pneumoccal, Influenza, Hepatitis B, Td, Zostavax, HCV  Electrocardiogram  Colorectal cancer screening  Cardiovascular disease screening  Diabetes screening  Glaucoma screening  Nutrition counseling  Prostate cancer screening  Smoking cessation counseling  Patient Instructions (the written plan) were given to the patient.   Varney Biles, LPN   [phone]    Reviewed above information.  Agree with plan.   Dr Nicki Reaper

## 2016-09-11 NOTE — Telephone Encounter (Signed)
Patient will call and let us know if he is taking this

## 2016-09-11 NOTE — Telephone Encounter (Signed)
See previous note from 12/15.

## 2016-09-20 ENCOUNTER — Telehealth: Payer: Self-pay | Admitting: Internal Medicine

## 2016-09-20 MED ORDER — PANTOPRAZOLE SODIUM 40 MG PO TBEC: DELAYED_RELEASE_TABLET | ORAL | 2 refills | Status: DC

## 2016-09-20 MED ORDER — LEVOTHYROXINE SODIUM 112 MCG PO TABS: ORAL_TABLET | ORAL | 2 refills | Status: DC

## 2016-09-20 MED ORDER — ROSUVASTATIN CALCIUM 20 MG PO TABS: ORAL_TABLET | ORAL | 2 refills | Status: DC

## 2016-09-20 NOTE — Telephone Encounter (Signed)
Medication has been refilled.

## 2016-09-20 NOTE — Telephone Encounter (Signed)
Pharmacy called requesting a refill on levothyroxine (SYNTHROID, LEVOTHROID) 112 MCG tablet, pantoprazole (PROTONIX) 40 MG tablet, and CRESTOR 20 MG tablet. Insurance is requesting a 90 day supply. Please advise, thank you!

## 2016-10-08 ENCOUNTER — Other Ambulatory Visit (INDEPENDENT_AMBULATORY_CARE_PROVIDER_SITE_OTHER): Payer: Medicare Other

## 2016-10-08 DIAGNOSIS — D649 Anemia, unspecified: Secondary | ICD-10-CM

## 2016-10-08 LAB — CBC WITH DIFFERENTIAL/PLATELET
BASOS ABS: 0.1 10*3/uL (ref 0.0–0.1)
Basophils Relative: 0.9 % (ref 0.0–3.0)
EOS ABS: 0.5 10*3/uL (ref 0.0–0.7)
Eosinophils Relative: 8.9 % — ABNORMAL HIGH (ref 0.0–5.0)
HEMATOCRIT: 37.6 % — AB (ref 39.0–52.0)
HEMOGLOBIN: 12.4 g/dL — AB (ref 13.0–17.0)
LYMPHS PCT: 26.2 % (ref 12.0–46.0)
Lymphs Abs: 1.4 10*3/uL (ref 0.7–4.0)
MCHC: 32.9 g/dL (ref 30.0–36.0)
MCV: 79.4 fl (ref 78.0–100.0)
MONOS PCT: 9 % (ref 3.0–12.0)
Monocytes Absolute: 0.5 10*3/uL (ref 0.1–1.0)
NEUTROS ABS: 3 10*3/uL (ref 1.4–7.7)
Neutrophils Relative %: 55 % (ref 43.0–77.0)
PLATELETS: 281 10*3/uL (ref 150.0–400.0)
RBC: 4.73 Mil/uL (ref 4.22–5.81)
RDW: 21.2 % — ABNORMAL HIGH (ref 11.5–15.5)
WBC: 5.4 10*3/uL (ref 4.0–10.5)

## 2016-10-08 LAB — FERRITIN: Ferritin: 43.4 ng/mL (ref 22.0–322.0)

## 2016-10-09 ENCOUNTER — Other Ambulatory Visit: Payer: Self-pay | Admitting: Internal Medicine

## 2016-10-09 DIAGNOSIS — E785 Hyperlipidemia, unspecified: Secondary | ICD-10-CM

## 2016-10-09 DIAGNOSIS — D649 Anemia, unspecified: Secondary | ICD-10-CM

## 2016-10-09 DIAGNOSIS — I10 Essential (primary) hypertension: Secondary | ICD-10-CM

## 2016-10-09 DIAGNOSIS — R739 Hyperglycemia, unspecified: Secondary | ICD-10-CM

## 2016-10-09 NOTE — Progress Notes (Signed)
Order placed for f/u labs.  

## 2016-10-25 ENCOUNTER — Ambulatory Visit (INDEPENDENT_AMBULATORY_CARE_PROVIDER_SITE_OTHER): Payer: Medicare Other

## 2016-10-25 DIAGNOSIS — Z23 Encounter for immunization: Secondary | ICD-10-CM | POA: Diagnosis not present

## 2016-11-06 DIAGNOSIS — E7800 Pure hypercholesterolemia, unspecified: Secondary | ICD-10-CM | POA: Diagnosis not present

## 2016-11-06 DIAGNOSIS — I340 Nonrheumatic mitral (valve) insufficiency: Secondary | ICD-10-CM | POA: Diagnosis not present

## 2016-11-06 DIAGNOSIS — I10 Essential (primary) hypertension: Secondary | ICD-10-CM | POA: Diagnosis not present

## 2016-11-06 DIAGNOSIS — I472 Ventricular tachycardia: Secondary | ICD-10-CM | POA: Diagnosis not present

## 2016-11-06 DIAGNOSIS — I255 Ischemic cardiomyopathy: Secondary | ICD-10-CM | POA: Diagnosis not present

## 2016-11-06 DIAGNOSIS — I6522 Occlusion and stenosis of left carotid artery: Secondary | ICD-10-CM | POA: Diagnosis not present

## 2016-11-06 DIAGNOSIS — I5022 Chronic systolic (congestive) heart failure: Secondary | ICD-10-CM | POA: Diagnosis not present

## 2016-11-06 DIAGNOSIS — I2510 Atherosclerotic heart disease of native coronary artery without angina pectoris: Secondary | ICD-10-CM | POA: Diagnosis not present

## 2016-11-06 DIAGNOSIS — I341 Nonrheumatic mitral (valve) prolapse: Secondary | ICD-10-CM | POA: Diagnosis not present

## 2016-11-06 DIAGNOSIS — Z9889 Other specified postprocedural states: Secondary | ICD-10-CM | POA: Diagnosis not present

## 2016-11-08 ENCOUNTER — Other Ambulatory Visit (INDEPENDENT_AMBULATORY_CARE_PROVIDER_SITE_OTHER): Payer: Self-pay | Admitting: Vascular Surgery

## 2016-11-08 DIAGNOSIS — Z48812 Encounter for surgical aftercare following surgery on the circulatory system: Secondary | ICD-10-CM

## 2016-11-08 DIAGNOSIS — I6529 Occlusion and stenosis of unspecified carotid artery: Secondary | ICD-10-CM

## 2016-11-09 ENCOUNTER — Encounter (INDEPENDENT_AMBULATORY_CARE_PROVIDER_SITE_OTHER): Payer: Self-pay | Admitting: Vascular Surgery

## 2016-11-09 ENCOUNTER — Ambulatory Visit (INDEPENDENT_AMBULATORY_CARE_PROVIDER_SITE_OTHER): Payer: Medicare Other | Admitting: Vascular Surgery

## 2016-11-09 ENCOUNTER — Ambulatory Visit (INDEPENDENT_AMBULATORY_CARE_PROVIDER_SITE_OTHER): Payer: Medicare Other

## 2016-11-09 VITALS — BP 112/65 | HR 80 | Resp 16 | Wt 210.0 lb

## 2016-11-09 DIAGNOSIS — R29898 Other symptoms and signs involving the musculoskeletal system: Secondary | ICD-10-CM | POA: Diagnosis not present

## 2016-11-09 DIAGNOSIS — Z48812 Encounter for surgical aftercare following surgery on the circulatory system: Secondary | ICD-10-CM

## 2016-11-09 DIAGNOSIS — E785 Hyperlipidemia, unspecified: Secondary | ICD-10-CM

## 2016-11-09 DIAGNOSIS — I6529 Occlusion and stenosis of unspecified carotid artery: Secondary | ICD-10-CM

## 2016-11-09 DIAGNOSIS — I10 Essential (primary) hypertension: Secondary | ICD-10-CM | POA: Diagnosis not present

## 2016-11-09 DIAGNOSIS — I6523 Occlusion and stenosis of bilateral carotid arteries: Secondary | ICD-10-CM

## 2016-11-09 LAB — VAS US CAROTID
LCCADDIAS: -18 cm/s
LCCADSYS: -111 cm/s
LEFT ECA DIAS: -10 cm/s
LICADSYS: -33 cm/s
LICAPSYS: 89 cm/s
Left CCA prox dias: 21 cm/s
Left CCA prox sys: 138 cm/s
Left ICA dist dias: -9 cm/s
Left ICA prox dias: 13 cm/s
RCCAPSYS: 125 cm/s
RIGHT CCA MID DIAS: 18 cm/s
RIGHT ECA DIAS: -10 cm/s
Right CCA prox dias: 13 cm/s
Right cca dist sys: -36 cm/s

## 2016-11-09 NOTE — Assessment & Plan Note (Signed)
lipid control important in reducing the progression of atherosclerotic disease. Continue statin therapy  

## 2016-11-09 NOTE — Assessment & Plan Note (Signed)
Outpatient physical therapy. Does not appear to be secondary to carotid disease.

## 2016-11-09 NOTE — Progress Notes (Signed)
MRN : [id number]  NAME SEESE is a 76 y.o. (Aug 03, 1941) male who presents with chief complaint of  Chief Complaint  Patient presents with  . Follow-up  .  History of Present Illness: Patient returns in follow-up of his carotid disease. He is now almost 10 years status post right carotid artery stent placement for symptomatic carotid stenosis. He is doing well and denies any focal neurologic symptoms currently. He has had some progressive weakness of his left arm and hand and has had some difficulties with that arm ever since shoulder surgery many years ago. He and his wife asked about physical therapy for this which sounds pre-reasonable. His carotid duplex today reveals a patent right carotid artery stent without evidence of restenosis. His left carotid artery stenosis remains in the mild range of 1-39%.  Current Outpatient Prescriptions  Medication Sig Dispense Refill  . acetaminophen (TYLENOL) 500 MG tablet Take 1,000 mg by mouth every 6 (six) hours as needed.    . Aspirin 81 MG EC tablet Take 81 mg by mouth daily.    . citalopram (CELEXA) 20 MG tablet TAKE 1 TABLET (20 MG TOTAL) BY MOUTH DAILY. 30 tablet 5  . clopidogrel (PLAVIX) 75 MG tablet Take 1 tablet (75 mg total) by mouth daily. 30 tablet 5  . fexofenadine (ALLEGRA) 180 MG tablet Take 180 mg by mouth daily. As needed    . levothyroxine (SYNTHROID, LEVOTHROID) 112 MCG tablet TAKE 1 TABLET (112 MCG TOTAL) BY MOUTH DAILY. 90 tablet 2  . lisinopril (PRINIVIL,ZESTRIL) 40 MG tablet Take 40 mg by mouth daily.    Marland Kitchen lisinopril (PRINIVIL,ZESTRIL) 40 MG tablet TAKE 1 TABLET (40 MG TOTAL) BY MOUTH DAILY. 30 tablet 6  . LORazepam (ATIVAN) 1 MG tablet TAKE 1 TABLET BY MOUTH AT BEDTIME AS NEEDED FOR ANXIETY 30 tablet 1  . metoprolol tartrate (LOPRESSOR) 25 MG tablet Take 1 tablet (25 mg total) by mouth 2 (two) times daily. 180 tablet 3  . nitroGLYCERIN (NITROSTAT) 0.4 MG SL tablet Place 0.4 mg under the tongue every 5 (five) minutes as  needed. If a third tablet is needed please call 911.    . pantoprazole (PROTONIX) 40 MG tablet TAKE 1 TABLET (40 MG TOTAL) BY MOUTH DAILY. 90 tablet 2  . rosuvastatin (CRESTOR) 20 MG tablet TAKE 1 TABLET (20 MG TOTAL) BY MOUTH NIGHTLY. 90 tablet 2   No current facility-administered medications for this visit.     Past Medical History:  Diagnosis Date  . Anemia   . Anxiety   . Arthritis   . CAD (coronary artery disease)   . Cardiomyopathy ([place])   . Carotid artery stenosis    high-grade symptomatic right sided s/p stent twice  . CHF (congestive heart failure) (HCC)    chronic movement fluid and history of CHF he noticed in the   . Depression   . Fractures, multiple    Metal rod in left foot  . GERD (gastroesophageal reflux disease)   . Heart block   . Hypercholesterolemia   . Hyperglycemia   . Hypertension   . Hypothyroidism   . Knee pain   . Presence of permanent cardiac pacemaker   . Shortness of breath dyspnea   . Stroke [facility])    clot in neck broke off left side weakness 2008  resolved  . Syncope    second degree heart block s/p St Jude's pacemaker placement 12/08 with his history is limited with any  . Ulcer of gastric fundus  Past Surgical History:  Procedure Laterality Date  . CARDIAC CATHETERIZATION    . CORONARY ANGIOPLASTY     stents x 3  . heart stint  11/04/2014   [facility]  . HERNIA REPAIR     inguinal  . INSERT / REPLACE / REMOVE PACEMAKER    . JOINT REPLACEMENT     right knee  . KNEE ARTHROPLASTY Right 02/08/2016   Procedure: COMPUTER ASSISTED TOTAL KNEE ARTHROPLASTY;  Surgeon: Dereck Leep, MD;  Location: [facility] ORS;  Service: Orthopedics;  Laterality: Right;  . KNEE ARTHROPLASTY Left 06/27/2016   Procedure: COMPUTER ASSISTED TOTAL KNEE ARTHROPLASTY;  Surgeon: Dereck Leep, MD;  Location: [facility] ORS;  Service: Orthopedics;  Laterality: Left;  . PACEMAKER INSERTION  08/2007  . PACEMAKER INSERTION N/A 11/16/2015   Procedure: INSERTION PACEMAKER/  PACEMAKER CHANGE OUT;  Surgeon: Isaias Cowman, MD;  Location: [facility] ORS;  Service: Cardiovascular;  Laterality: N/A;  . PERCUTANEOUS PLACEMENT INTRAVASCULAR STENT CERVICAL CAROTID ARTERY     twice, right carotid artery    Social History Social History  Substance Use Topics  . Smoking status: Never Smoker  . Smokeless tobacco: Never Used  . Alcohol use No     Family History Family History  Problem Relation Age of Onset  . Heart disease Mother     CHF  . Heart disease Father     CHF  . Thyroid disease Father     hypothyroidism  . Colon polyps Father   . Diabetes Sister     diet controlled  . Thyroid disease Sister   . Cancer Sister     breast   . Thyroid disease Sister   . Cancer Sister     breast  . Colon cancer Neg Hx   . Prostate cancer Neg Hx      Allergies  Allergen Reactions  . Beta Adrenergic Blockers Other (See Comments)    Second Degree Heart Block  . Lipitor [Atorvastatin] Other (See Comments)    Myalgia Other Reaction: myalgia Myalgia      REVIEW OF SYSTEMS (Negative unless checked)  Constitutional: [] Weight loss  [] Fever  [] Chills Cardiac: [] Chest pain   [] Chest pressure   [] Palpitations   [] Shortness of breath when laying flat   [] Shortness of breath at rest   [] Shortness of breath with exertion. Vascular:  [] Pain in legs with walking   [] Pain in legs at rest   [] Pain in legs when laying flat   [] Claudication   [] Pain in feet when walking  [] Pain in feet at rest  [] Pain in feet when laying flat   [] History of DVT   [] Phlebitis   [] Swelling in legs   [] Varicose veins   [] Non-healing ulcers Pulmonary:   [] Uses home oxygen   [] Productive cough   [] Hemoptysis   [] Wheeze  [] COPD   [] Asthma Neurologic:  [] Dizziness  [] Blackouts   [] Seizures   [] History of stroke   [x] History of TIA  [] Aphasia   [] Temporary blindness   [] Dysphagia   [x] Weakness or numbness in arms   [] Weakness or numbness in legs Musculoskeletal:  [x] Arthritis   [] Joint swelling    [] Joint pain   [] Low back pain Hematologic:  [] Easy bruising  [] Easy bleeding   [] Hypercoagulable state   [] Anemic  [] Hepatitis Gastrointestinal:  [] Blood in stool   [] Vomiting blood  [] Gastroesophageal reflux/heartburn   [] Difficulty swallowing. Genitourinary:  [] Chronic kidney disease   [] Difficult urination  [] Frequent urination  [] Burning with urination   [] Blood in urine Skin:  [] Rashes   []   Ulcers   [] Wounds Psychological:  [] History of anxiety   []  History of major depression.  Physical Examination  Vitals:   11/09/16 1106  BP: 112/65  Pulse: 80  Resp: 16  Weight: 210 lb (95.3 kg)   Body mass index is 31.47 kg/m. Gen:  WD/WN, NAD Head: [place]/AT, No temporalis wasting. Ear/Nose/Throat: Hearing grossly intact, nares w/o erythema or drainage, trachea midline Eyes: Conjunctiva clear. Sclera non-icteric. Blind in left eye. Neck: Supple.  No bruit or JVD.  Pulmonary:  Good air movement, equal and clear to auscultation bilaterally.  Cardiac: RRR, normal S1, S2, no Murmurs, rubs or gallops. Vascular:  Vessel Right Left  Radial Palpable Palpable                                   Gastrointestinal: soft, non-tender/non-distended. No guarding/reflex.  Musculoskeletal: left arm and hand 4/5 strength, 5/5 throughout other extremities.   Neurologic: CN 2-12 intact. Sensation grossly intact in extremities.  Speech is fluent. Motor exam as listed above. Psychiatric: Judgment intact, Mood & affect appropriate for pt's clinical situation. Dermatologic: No rashes or ulcers noted.  No cellulitis or open wounds. Lymph : No Cervical, Axillary, or Inguinal lymphadenopathy.     CBC Lab Results  Component Value Date   WBC 5.4 10/08/2016   HGB 12.4 (L) 10/08/2016   HCT 37.6 (L) 10/08/2016   MCV 79.4 10/08/2016   PLT 281.0 10/08/2016    BMET    Component Value Date/Time   NA 137 09/03/2016 0835   K 4.7 09/03/2016 0835   CL 101 09/03/2016 0835   CO2 28 09/03/2016 0835   GLUCOSE  107 (H) 09/03/2016 0835   BUN 13 09/03/2016 0835   CREATININE 0.92 09/03/2016 0835   CALCIUM 9.5 09/03/2016 0835   GFRNONAA >60 06/29/2016 0352   GFRAA >60 06/29/2016 0352   CrCl cannot be calculated (Patient's most recent lab result is older than the maximum 21 days allowed.).  COAG Lab Results  Component Value Date   INR 1.13 06/11/2016   INR 1.14 01/25/2016   INR 1.08 11/09/2015    Radiology No results found.    Assessment/Plan HLD (hyperlipidemia) lipid control important in reducing the progression of atherosclerotic disease. Continue statin therapy   Benign essential HTN blood pressure control important in reducing the progression of atherosclerotic disease. On appropriate oral medications.   Carotid artery stenosis His carotid duplex today reveals a patent right carotid artery stent without evidence of restenosis. His left carotid artery stenosis remains in the mild range of 1-39%. Doing well. Check in one year. Continue aspirin, Plavix, and Crestor.  Weakness of left arm Outpatient physical therapy. Does not appear to be secondary to carotid disease.    Leotis Pain, MD  11/09/2016 12:50 PM    This note was created with Dragon medical transcription system.  Any errors from dictation are purely unintentional

## 2016-11-09 NOTE — Assessment & Plan Note (Signed)
His carotid duplex today reveals a patent right carotid artery stent without evidence of restenosis. His left carotid artery stenosis remains in the mild range of 1-39%. Doing well. Check in one year. Continue aspirin, Plavix, and Crestor.

## 2016-11-09 NOTE — Assessment & Plan Note (Signed)
blood pressure control important in reducing the progression of atherosclerotic disease. On appropriate oral medications.  

## 2016-11-12 ENCOUNTER — Encounter: Payer: Self-pay | Admitting: Podiatry

## 2016-11-12 ENCOUNTER — Ambulatory Visit (INDEPENDENT_AMBULATORY_CARE_PROVIDER_SITE_OTHER): Payer: Medicare Other | Admitting: Podiatry

## 2016-11-12 DIAGNOSIS — M21619 Bunion of unspecified foot: Secondary | ICD-10-CM

## 2016-11-12 DIAGNOSIS — B351 Tinea unguium: Secondary | ICD-10-CM | POA: Diagnosis not present

## 2016-11-12 DIAGNOSIS — M2040 Other hammer toe(s) (acquired), unspecified foot: Secondary | ICD-10-CM

## 2016-11-12 DIAGNOSIS — M79676 Pain in unspecified toe(s): Secondary | ICD-10-CM | POA: Diagnosis not present

## 2016-11-12 NOTE — Progress Notes (Addendum)
Complaint:  Visit Type: Patient returns to my office for continued preventative foot care services. Complaint: Patient states" my nails have grown long and thick and become painful to walk and wear shoes"  The patient presents for preventative foot care services. No changes to ROS  Podiatric Exam: Vascular: dorsalis pedis and posterior tibial pulses are palpable bilateral. Capillary return is immediate. Temperature gradient is WNL. Skin turgor WNL  Sensorium: Diminished  Semmes Weinstein monofilament test. Normal tactile sensation bilaterally. Nail Exam: Pt has thick disfigured discolored nails with subungual debris noted bilateral entire nail hallux through fifth toenails Ulcer Exam: There is no evidence of ulcer or pre-ulcerative changes or infection. Orthopedic Exam: Muscle tone and strength are WNL. No limitations in general ROM. No crepitus or effusions noted. Foot type and digits show no abnormalities. HAV  B/L with hammer toes.. Skin: No Porokeratosis. No infection or ulcers  Diagnosis:  Onychomycosis, , Pain in right toe, pain in left toes  Treatment & Plan Procedures and Treatment: Consent by patient was obtained for treatment procedures. The patient understood the discussion of treatment and procedures well. All questions were answered thoroughly reviewed. Debridement of mycotic and hypertrophic toenails, 1 through 5 bilateral and clearing of subungual debris. No ulceration, no infection noted.  Return Visit-Office Procedure: Patient instructed to return to the office for a follow up visit 3 months for continued evaluation and treatment.    Brek Reece DPM 

## 2016-11-17 ENCOUNTER — Other Ambulatory Visit: Payer: Self-pay | Admitting: Internal Medicine

## 2016-11-19 NOTE — Telephone Encounter (Signed)
Printed and faxed

## 2016-11-19 NOTE — Telephone Encounter (Signed)
Refilled on 09/07/2016 with 1 refill. Last office visit 09/07/2016. Next office visit is 01/10/2017. Please advise.

## 2016-11-23 DIAGNOSIS — I255 Ischemic cardiomyopathy: Secondary | ICD-10-CM | POA: Diagnosis not present

## 2016-11-23 DIAGNOSIS — I341 Nonrheumatic mitral (valve) prolapse: Secondary | ICD-10-CM | POA: Diagnosis not present

## 2016-11-23 DIAGNOSIS — I5022 Chronic systolic (congestive) heart failure: Secondary | ICD-10-CM | POA: Diagnosis not present

## 2016-11-23 DIAGNOSIS — I340 Nonrheumatic mitral (valve) insufficiency: Secondary | ICD-10-CM | POA: Diagnosis not present

## 2016-12-13 DIAGNOSIS — I420 Dilated cardiomyopathy: Secondary | ICD-10-CM | POA: Diagnosis not present

## 2017-01-01 ENCOUNTER — Other Ambulatory Visit: Payer: Self-pay | Admitting: Internal Medicine

## 2017-01-04 ENCOUNTER — Other Ambulatory Visit: Payer: Self-pay

## 2017-01-04 MED ORDER — LISINOPRIL 40 MG PO TABS: ORAL_TABLET | ORAL | 1 refills | Status: DC

## 2017-01-07 ENCOUNTER — Other Ambulatory Visit (INDEPENDENT_AMBULATORY_CARE_PROVIDER_SITE_OTHER): Payer: Medicare Other

## 2017-01-07 DIAGNOSIS — I10 Essential (primary) hypertension: Secondary | ICD-10-CM | POA: Diagnosis not present

## 2017-01-07 DIAGNOSIS — D649 Anemia, unspecified: Secondary | ICD-10-CM | POA: Diagnosis not present

## 2017-01-07 DIAGNOSIS — E785 Hyperlipidemia, unspecified: Secondary | ICD-10-CM

## 2017-01-07 DIAGNOSIS — R739 Hyperglycemia, unspecified: Secondary | ICD-10-CM

## 2017-01-07 LAB — CBC WITH DIFFERENTIAL/PLATELET
BASOS ABS: 0.1 10*3/uL (ref 0.0–0.1)
Basophils Relative: 1.2 % (ref 0.0–3.0)
EOS ABS: 0.4 10*3/uL (ref 0.0–0.7)
Eosinophils Relative: 7.5 % — ABNORMAL HIGH (ref 0.0–5.0)
HEMATOCRIT: 40.3 % (ref 39.0–52.0)
Hemoglobin: 13.3 g/dL (ref 13.0–17.0)
LYMPHS ABS: 1.5 10*3/uL (ref 0.7–4.0)
LYMPHS PCT: 26.6 % (ref 12.0–46.0)
MCHC: 32.9 g/dL (ref 30.0–36.0)
MCV: 86.6 fl (ref 78.0–100.0)
Monocytes Absolute: 0.6 10*3/uL (ref 0.1–1.0)
Monocytes Relative: 10.5 % (ref 3.0–12.0)
NEUTROS ABS: 3 10*3/uL (ref 1.4–7.7)
Neutrophils Relative %: 54.2 % (ref 43.0–77.0)
Platelets: 228 10*3/uL (ref 150.0–400.0)
RBC: 4.66 Mil/uL (ref 4.22–5.81)
RDW: 14.7 % (ref 11.5–15.5)
WBC: 5.6 10*3/uL (ref 4.0–10.5)

## 2017-01-07 LAB — LIPID PANEL
CHOLESTEROL: 138 mg/dL (ref 0–200)
HDL: 45.3 mg/dL (ref >39.00)
LDL Cholesterol: 70 mg/dL (ref 0–99)
NONHDL: 92.61
Total CHOL/HDL Ratio: 3
Triglycerides: 115 mg/dL (ref 0.0–149.0)
VLDL: 23 mg/dL (ref 0.0–40.0)

## 2017-01-07 LAB — BASIC METABOLIC PANEL
BUN: 8 mg/dL (ref 6–23)
CALCIUM: 9.5 mg/dL (ref 8.4–10.5)
CHLORIDE: 102 meq/L (ref 96–112)
CO2: 29 meq/L (ref 19–32)
CREATININE: 1 mg/dL (ref 0.40–1.50)
GFR: 77.08 mL/min (ref >60.00)
Glucose, Bld: 95 mg/dL (ref 70–99)
POTASSIUM: 4.4 meq/L (ref 3.5–5.1)
SODIUM: 137 meq/L (ref 135–145)

## 2017-01-07 LAB — HEPATIC FUNCTION PANEL
ALBUMIN: 4.3 g/dL (ref 3.5–5.2)
ALK PHOS: 79 U/L (ref 39–117)
ALT: 7 U/L (ref 0–53)
AST: 13 U/L (ref 0–37)
Bilirubin, Direct: 0.1 mg/dL (ref 0.0–0.3)
TOTAL PROTEIN: 7.2 g/dL (ref 6.0–8.3)
Total Bilirubin: 0.6 mg/dL (ref 0.2–1.2)

## 2017-01-07 LAB — HEMOGLOBIN A1C: HEMOGLOBIN A1C: 6.1 % (ref 4.6–6.5)

## 2017-01-07 LAB — FERRITIN: Ferritin: 40.4 ng/mL (ref 22.0–322.0)

## 2017-01-10 ENCOUNTER — Encounter: Payer: Self-pay | Admitting: Internal Medicine

## 2017-01-10 ENCOUNTER — Ambulatory Visit (INDEPENDENT_AMBULATORY_CARE_PROVIDER_SITE_OTHER): Payer: Medicare Other | Admitting: Internal Medicine

## 2017-01-10 VITALS — BP 118/62 | HR 68 | Temp 98.0°F | Resp 12 | Ht 69.0 in | Wt 216.2 lb

## 2017-01-10 DIAGNOSIS — I10 Essential (primary) hypertension: Secondary | ICD-10-CM | POA: Diagnosis not present

## 2017-01-10 DIAGNOSIS — I6523 Occlusion and stenosis of bilateral carotid arteries: Secondary | ICD-10-CM

## 2017-01-10 DIAGNOSIS — R739 Hyperglycemia, unspecified: Secondary | ICD-10-CM

## 2017-01-10 DIAGNOSIS — I2510 Atherosclerotic heart disease of native coronary artery without angina pectoris: Secondary | ICD-10-CM

## 2017-01-10 DIAGNOSIS — E039 Hypothyroidism, unspecified: Secondary | ICD-10-CM | POA: Diagnosis not present

## 2017-01-10 DIAGNOSIS — E7800 Pure hypercholesterolemia, unspecified: Secondary | ICD-10-CM

## 2017-01-10 DIAGNOSIS — Z23 Encounter for immunization: Secondary | ICD-10-CM | POA: Diagnosis not present

## 2017-01-10 NOTE — Progress Notes (Signed)
Pre-visit discussion using our clinic review tool. No additional management support is needed unless otherwise documented below in the visit note.  

## 2017-01-10 NOTE — Progress Notes (Signed)
Patient ID: Daniel Austin, male   DOB: 1941-05-30, 76 y.o.   MRN: [id number]   Subjective:    Patient ID: Daniel Austin, male    DOB: 11-01-1940, 76 y.o.   MRN: [id number]  HPI  Patient here for a scheduled follow up.  States he is doing well.  Feels better.  Knee is doing better.  Able to get around much better.  No pain.  No chest pain.  No sob.  No acid reflux.  No abdominal pain.  Bowels moving.  Saw Dr Lucky Cowboy 11/09/16.  Right ICA patent and left ICA 1-39%.  Recheck one year.  Saw cardiology 11/2016.  Stable.  Recommended f/u in 4 months.     Past Medical History:  Diagnosis Date  . Anemia   . Anxiety   . Arthritis   . CAD (coronary artery disease)   . Cardiomyopathy ([place])   . Carotid artery stenosis    high-grade symptomatic right sided s/p stent twice  . CHF (congestive heart failure) (HCC)    chronic movement fluid and history of CHF he noticed in the   . Depression   . Fractures, multiple    Metal rod in left foot  . GERD (gastroesophageal reflux disease)   . Heart block   . Hypercholesterolemia   . Hyperglycemia   . Hypertension   . Hypothyroidism   . Knee pain   . Presence of permanent cardiac pacemaker   . Shortness of breath dyspnea   . Stroke [facility])    clot in neck broke off left side weakness 2008  resolved  . Syncope    second degree heart block s/p St Jude's pacemaker placement 12/08 with his history is limited with any  . Ulcer of gastric fundus    Past Surgical History:  Procedure Laterality Date  . CARDIAC CATHETERIZATION    . CORONARY ANGIOPLASTY     stents x 3  . heart stint  11/04/2014   [facility]  . HERNIA REPAIR     inguinal  . INSERT / REPLACE / REMOVE PACEMAKER    . JOINT REPLACEMENT     right knee  . KNEE ARTHROPLASTY Right 02/08/2016   Procedure: COMPUTER ASSISTED TOTAL KNEE ARTHROPLASTY;  Surgeon: Dereck Leep, MD;  Location: [facility] ORS;  Service: Orthopedics;  Laterality: Right;  . KNEE ARTHROPLASTY Left 06/27/2016   Procedure:  COMPUTER ASSISTED TOTAL KNEE ARTHROPLASTY;  Surgeon: Dereck Leep, MD;  Location: [facility] ORS;  Service: Orthopedics;  Laterality: Left;  . PACEMAKER INSERTION  08/2007  . PACEMAKER INSERTION N/A 11/16/2015   Procedure: INSERTION PACEMAKER/ PACEMAKER CHANGE OUT;  Surgeon: Isaias Cowman, MD;  Location: [facility] ORS;  Service: Cardiovascular;  Laterality: N/A;  . PERCUTANEOUS PLACEMENT INTRAVASCULAR STENT CERVICAL CAROTID ARTERY     twice, right carotid artery   Family History  Problem Relation Age of Onset  . Heart disease Mother     CHF  . Heart disease Father     CHF  . Thyroid disease Father     hypothyroidism  . Colon polyps Father   . Diabetes Sister     diet controlled  . Thyroid disease Sister   . Cancer Sister     breast   . Thyroid disease Sister   . Cancer Sister     breast  . Colon cancer Neg Hx   . Prostate cancer Neg Hx    Social History   Social History  . Marital status: Married    Spouse name:  N/A  . Number of children: 1  . Years of education: N/A   Social History Main Topics  . Smoking status: Never Smoker  . Smokeless tobacco: Never Used  . Alcohol use No  . Drug use: No  . Sexual activity: Not Currently   Other Topics Concern  . None   Social History Narrative  . None    Outpatient Encounter Prescriptions as of 01/10/2017  Medication Sig  . acetaminophen (TYLENOL) 500 MG tablet Take 1,000 mg by mouth every 6 (six) hours as needed.  . Aspirin 81 MG EC tablet Take 81 mg by mouth daily.  . citalopram (CELEXA) 20 MG tablet TAKE 1 TABLET (20 MG TOTAL) BY MOUTH DAILY.  . citalopram (CELEXA) 20 MG tablet TAKE 1 TABLET (20 MG TOTAL) BY MOUTH DAILY.  Marland Kitchen clopidogrel (PLAVIX) 75 MG tablet TAKE 1 TABLET (75 MG TOTAL) BY MOUTH DAILY.  . fexofenadine (ALLEGRA) 180 MG tablet Take 180 mg by mouth daily. As needed  . levothyroxine (SYNTHROID, LEVOTHROID) 112 MCG tablet TAKE 1 TABLET (112 MCG TOTAL) BY MOUTH DAILY.  Marland Kitchen lisinopril (PRINIVIL,ZESTRIL) 40 MG tablet  TAKE 1 TABLET (40 MG TOTAL) BY MOUTH DAILY.  . metoprolol tartrate (LOPRESSOR) 25 MG tablet Take 1 tablet (25 mg total) by mouth 2 (two) times daily.  . naproxen sodium (ANAPROX) 220 MG tablet Take by mouth.  . nitroGLYCERIN (NITROSTAT) 0.4 MG SL tablet Place 0.4 mg under the tongue every 5 (five) minutes as needed. If a third tablet is needed please call 911.  . pantoprazole (PROTONIX) 40 MG tablet TAKE 1 TABLET (40 MG TOTAL) BY MOUTH DAILY.  . rosuvastatin (CRESTOR) 20 MG tablet TAKE 1 TABLET (20 MG TOTAL) BY MOUTH NIGHTLY.  . [DISCONTINUED] LORazepam (ATIVAN) 1 MG tablet TAKE 1 TABLET BY MOUTH AT BEDTIME AS NEEDED FOR ANXIETY  . [DISCONTINUED] traMADol (ULTRAM) 50 MG tablet Take by mouth.   No facility-administered encounter medications on file as of 01/10/2017.     Review of Systems  Constitutional: Negative for appetite change and unexpected weight change.  HENT: Negative for congestion and sinus pressure.   Respiratory: Negative for cough, chest tightness and shortness of breath.   Cardiovascular: Negative for chest pain, palpitations and leg swelling.  Gastrointestinal: Negative for abdominal pain, diarrhea, nausea and vomiting.  Genitourinary: Negative for difficulty urinating and dysuria.  Musculoskeletal: Negative for back pain and joint swelling.  Skin: Negative for color change and rash.  Neurological: Negative for dizziness, light-headedness and headaches.  Psychiatric/Behavioral: Negative for agitation and dysphoric mood.       Objective:    Physical Exam  Constitutional: He appears well-developed and well-nourished. No distress.  HENT:  Nose: Nose normal.  Mouth/Throat: Oropharynx is clear and moist.  Neck: Neck supple. No thyromegaly present.  Cardiovascular: Normal rate and regular rhythm.   Pulmonary/Chest: Effort normal and breath sounds normal. No respiratory distress.  Abdominal: Soft. Bowel sounds are normal. There is no tenderness.  Musculoskeletal: He  exhibits no edema.  Lymphadenopathy:    He has no cervical adenopathy.  Skin: No rash noted. No erythema.  Psychiatric: He has a normal mood and affect. His behavior is normal.    BP 118/62 (BP Location: Left Arm, Patient Position: Sitting, Cuff Size: Large)   Pulse 68   Temp 98 F (36.7 C) (Oral)   Resp 12   Ht _0  (1.753 m)   Wt 216 lb 3.2 oz (98.1 kg)   SpO2 98%   BMI 31.93 kg/m  Wt Readings from Last 3 Encounters:  01/10/17 216 lb 3.2 oz (98.1 kg)  11/09/16 210 lb (95.3 kg)  09/11/16 205 lb 1.9 oz (93 kg)     Lab Results  Component Value Date   WBC 5.6 01/07/2017   HGB 13.3 01/07/2017   HCT 40.3 01/07/2017   PLT 228.0 01/07/2017   GLUCOSE 95 01/07/2017   CHOL 138 01/07/2017   TRIG 115.0 01/07/2017   HDL 45.30 01/07/2017   LDLCALC 70 01/07/2017   ALT 7 01/07/2017   AST 13 01/07/2017   NA 137 01/07/2017   K 4.4 01/07/2017   CL 102 01/07/2017   CREATININE 1.00 01/07/2017   BUN 8 01/07/2017   CO2 29 01/07/2017   TSH 3.44 09/03/2016   PSA 0.33 08/26/2015   INR 1.13 06/11/2016   HGBA1C 6.1 01/07/2017   MICROALBUR 1.6 05/25/2014       Assessment & Plan:   Problem List Items Addressed This Visit    Carotid artery stenosis    Carotid duplex 11/09/16 with ICA (left) 1-39 and right patent.  Recommended f/u in one year.        Coronary atherosclerosis of native coronary artery    Followed by cardiology.  Remain on plavix and aspirin.  Stable.  Follow.  Continue risk factor modification.        Essential hypertension, benign    Blood pressure under good control.  Continue same medication regimen.  Follow pressures.  Follow metabolic panel.        Hyperglycemia    Low carb diet and exercise.  Follow met b and a1c.        Hypothyroidism    On thyroid replacement.  Follow tsh.       Pure hypercholesterolemia    Low cholesterol diet and exercise.  Follow lipid panel and liver function tests.  On crestor.         Other Visit Diagnoses    Need for  pneumococcal vaccine    -  Primary   Relevant Orders   Pneumococcal conjugate vaccine 13-valent (Completed)       Einar Pheasant, MD

## 2017-01-19 ENCOUNTER — Other Ambulatory Visit: Payer: Self-pay | Admitting: Internal Medicine

## 2017-01-21 ENCOUNTER — Encounter: Payer: Self-pay | Admitting: Internal Medicine

## 2017-01-21 NOTE — Assessment & Plan Note (Signed)
Blood pressure under good control.  Continue same medication regimen.  Follow pressures.  Follow metabolic panel.   

## 2017-01-21 NOTE — Assessment & Plan Note (Signed)
Carotid duplex 11/09/16 with ICA (left) 1-39 and right patent.  Recommended f/u in one year.   

## 2017-01-21 NOTE — Assessment & Plan Note (Signed)
Low cholesterol diet and exercise.  Follow lipid panel and liver function tests.  On crestor.   

## 2017-01-21 NOTE — Assessment & Plan Note (Signed)
Low carb diet and exercise.  Follow met b and a1c.   

## 2017-01-21 NOTE — Assessment & Plan Note (Signed)
Followed by cardiology.  Remain on plavix and aspirin.  Stable.  Follow.  Continue risk factor modification.

## 2017-01-21 NOTE — Assessment & Plan Note (Signed)
On thyroid replacement.  Follow tsh.  

## 2017-02-14 ENCOUNTER — Ambulatory Visit: Payer: Medicare Other | Admitting: Podiatry

## 2017-03-04 ENCOUNTER — Ambulatory Visit (INDEPENDENT_AMBULATORY_CARE_PROVIDER_SITE_OTHER): Payer: Medicare Other | Admitting: Podiatry

## 2017-03-04 ENCOUNTER — Encounter: Payer: Self-pay | Admitting: Podiatry

## 2017-03-04 DIAGNOSIS — B351 Tinea unguium: Secondary | ICD-10-CM | POA: Diagnosis not present

## 2017-03-04 DIAGNOSIS — M21619 Bunion of unspecified foot: Secondary | ICD-10-CM

## 2017-03-04 DIAGNOSIS — M79676 Pain in unspecified toe(s): Secondary | ICD-10-CM

## 2017-03-04 NOTE — Progress Notes (Signed)
Complaint:  Visit Type: Patient returns to my office for continued preventative foot care services. Complaint: Patient states" my nails have grown long and thick and become painful to walk and wear shoes"  The patient presents for preventative foot care services. No changes to ROS  Podiatric Exam: Vascular: dorsalis pedis and posterior tibial pulses are palpable bilateral. Capillary return is immediate. Temperature gradient is WNL. Skin turgor WNL  Sensorium: Diminished  Semmes Weinstein monofilament test. Normal tactile sensation bilaterally. Nail Exam: Pt has thick disfigured discolored nails with subungual debris noted bilateral entire nail hallux through fifth toenails Ulcer Exam: There is no evidence of ulcer or pre-ulcerative changes or infection. Orthopedic Exam: Muscle tone and strength are WNL. No limitations in general ROM. No crepitus or effusions noted. Foot type and digits show no abnormalities. HAV  B/L with hammer toes.. Skin: No Porokeratosis. No infection or ulcers  Diagnosis:  Onychomycosis, , Pain in right toe, pain in left toes  Treatment & Plan Procedures and Treatment: Consent by patient was obtained for treatment procedures. The patient understood the discussion of treatment and procedures well. All questions were answered thoroughly reviewed. Debridement of mycotic and hypertrophic toenails, 1 through 5 bilateral and clearing of subungual debris. No ulceration, no infection noted.  Return Visit-Office Procedure: Patient instructed to return to the office for a follow up visit 3 months for continued evaluation and treatment.    Gardiner Barefoot DPM

## 2017-03-06 DIAGNOSIS — I6529 Occlusion and stenosis of unspecified carotid artery: Secondary | ICD-10-CM | POA: Diagnosis not present

## 2017-03-06 DIAGNOSIS — I5022 Chronic systolic (congestive) heart failure: Secondary | ICD-10-CM | POA: Diagnosis not present

## 2017-03-06 DIAGNOSIS — I10 Essential (primary) hypertension: Secondary | ICD-10-CM | POA: Diagnosis not present

## 2017-03-06 DIAGNOSIS — I472 Ventricular tachycardia: Secondary | ICD-10-CM | POA: Diagnosis not present

## 2017-03-06 DIAGNOSIS — I2510 Atherosclerotic heart disease of native coronary artery without angina pectoris: Secondary | ICD-10-CM | POA: Diagnosis not present

## 2017-03-06 DIAGNOSIS — E785 Hyperlipidemia, unspecified: Secondary | ICD-10-CM | POA: Diagnosis not present

## 2017-03-06 DIAGNOSIS — I255 Ischemic cardiomyopathy: Secondary | ICD-10-CM | POA: Diagnosis not present

## 2017-03-23 ENCOUNTER — Other Ambulatory Visit: Payer: Self-pay | Admitting: Internal Medicine

## 2017-04-29 ENCOUNTER — Encounter: Payer: Self-pay | Admitting: Internal Medicine

## 2017-05-03 ENCOUNTER — Encounter: Payer: Self-pay | Admitting: Internal Medicine

## 2017-05-03 ENCOUNTER — Ambulatory Visit (INDEPENDENT_AMBULATORY_CARE_PROVIDER_SITE_OTHER): Payer: Medicare Other | Admitting: Internal Medicine

## 2017-05-03 DIAGNOSIS — I4729 Other ventricular tachycardia: Secondary | ICD-10-CM

## 2017-05-03 DIAGNOSIS — I10 Essential (primary) hypertension: Secondary | ICD-10-CM

## 2017-05-03 DIAGNOSIS — I472 Ventricular tachycardia: Secondary | ICD-10-CM | POA: Diagnosis not present

## 2017-05-03 DIAGNOSIS — I2510 Atherosclerotic heart disease of native coronary artery without angina pectoris: Secondary | ICD-10-CM | POA: Diagnosis not present

## 2017-05-03 DIAGNOSIS — I6523 Occlusion and stenosis of bilateral carotid arteries: Secondary | ICD-10-CM

## 2017-05-03 DIAGNOSIS — E039 Hypothyroidism, unspecified: Secondary | ICD-10-CM | POA: Diagnosis not present

## 2017-05-03 DIAGNOSIS — Z96659 Presence of unspecified artificial knee joint: Secondary | ICD-10-CM | POA: Diagnosis not present

## 2017-05-03 DIAGNOSIS — D649 Anemia, unspecified: Secondary | ICD-10-CM | POA: Diagnosis not present

## 2017-05-03 DIAGNOSIS — E7800 Pure hypercholesterolemia, unspecified: Secondary | ICD-10-CM

## 2017-05-03 DIAGNOSIS — R739 Hyperglycemia, unspecified: Secondary | ICD-10-CM

## 2017-05-03 DIAGNOSIS — F439 Reaction to severe stress, unspecified: Secondary | ICD-10-CM

## 2017-05-03 NOTE — Progress Notes (Signed)
Pre visit review using our clinic review tool, if applicable. No additional management support is needed unless otherwise documented below in the visit note. 

## 2017-05-03 NOTE — Progress Notes (Signed)
Patient ID: Daniel Austin, male   DOB: August 26, 1941, 76 y.o.   MRN: [id number]   Subjective:    Patient ID: Daniel Austin, male    DOB: 08/24/1941, 76 y.o.   MRN: [id number]  HPI  Patient here for a scheduled follow up.  He is doing relatively well.  Knees are doing well.  Able to be more active.  No chest pain.  No sob.  No acid reflux.  No abdominal pain.  Bowels moving.  Saw cardiology.  EF 45%.  Felt stable.  Recommended continuing current medication regimen.  Increased stress.  Discussed with him.  He wants to go back to work.  Felt better when working.  Wants to work part time.  No suicidal ideations.  Overall feels things are stable.     Past Medical History:  Diagnosis Date  . Anemia   . Anxiety   . Arthritis   . CAD (coronary artery disease)   . Cardiomyopathy ([place])   . Carotid artery stenosis    high-grade symptomatic right sided s/p stent twice  . CHF (congestive heart failure) (HCC)    chronic movement fluid and history of CHF he noticed in the   . Depression   . Fractures, multiple    Metal rod in left foot  . GERD (gastroesophageal reflux disease)   . Heart block   . Hypercholesterolemia   . Hyperglycemia   . Hypertension   . Hypothyroidism   . Knee pain   . Presence of permanent cardiac pacemaker   . Shortness of breath dyspnea   . Stroke [facility])    clot in neck broke off left side weakness 2008  resolved  . Syncope    second degree heart block s/p St Jude's pacemaker placement 12/08 with his history is limited with any  . Ulcer of gastric fundus    Past Surgical History:  Procedure Laterality Date  . CARDIAC CATHETERIZATION    . CORONARY ANGIOPLASTY     stents x 3  . heart stint  11/04/2014   [facility]  . HERNIA REPAIR     inguinal  . INSERT / REPLACE / REMOVE PACEMAKER    . JOINT REPLACEMENT     right knee  . KNEE ARTHROPLASTY Right 02/08/2016   Procedure: COMPUTER ASSISTED TOTAL KNEE ARTHROPLASTY;  Surgeon: Dereck Leep, MD;  Location: [facility]  ORS;  Service: Orthopedics;  Laterality: Right;  . KNEE ARTHROPLASTY Left 06/27/2016   Procedure: COMPUTER ASSISTED TOTAL KNEE ARTHROPLASTY;  Surgeon: Dereck Leep, MD;  Location: [facility] ORS;  Service: Orthopedics;  Laterality: Left;  . PACEMAKER INSERTION  08/2007  . PACEMAKER INSERTION N/A 11/16/2015   Procedure: INSERTION PACEMAKER/ PACEMAKER CHANGE OUT;  Surgeon: Isaias Cowman, MD;  Location: [facility] ORS;  Service: Cardiovascular;  Laterality: N/A;  . PERCUTANEOUS PLACEMENT INTRAVASCULAR STENT CERVICAL CAROTID ARTERY     twice, right carotid artery   Family History  Problem Relation Age of Onset  . Heart disease Mother        CHF  . Heart disease Father        CHF  . Thyroid disease Father        hypothyroidism  . Colon polyps Father   . Diabetes Sister        diet controlled  . Thyroid disease Sister   . Cancer Sister        breast   . Thyroid disease Sister   . Cancer Sister  breast  . Colon cancer Neg Hx   . Prostate cancer Neg Hx    Social History   Social History  . Marital status: Married    Spouse name: N/A  . Number of children: 1  . Years of education: N/A   Social History Main Topics  . Smoking status: Never Smoker  . Smokeless tobacco: Never Used  . Alcohol use No  . Drug use: No  . Sexual activity: Not Currently   Other Topics Concern  . None   Social History Narrative  . None    Outpatient Encounter Prescriptions as of 05/03/2017  Medication Sig  . acetaminophen (TYLENOL) 500 MG tablet Take 1,000 mg by mouth every 6 (six) hours as needed.  . Aspirin 81 MG EC tablet Take 81 mg by mouth daily.  . citalopram (CELEXA) 20 MG tablet TAKE 1 TABLET (20 MG TOTAL) BY MOUTH DAILY.  . citalopram (CELEXA) 20 MG tablet TAKE 1 TABLET (20 MG TOTAL) BY MOUTH DAILY.  Marland Kitchen clopidogrel (PLAVIX) 75 MG tablet TAKE 1 TABLET (75 MG TOTAL) BY MOUTH DAILY.  . fexofenadine (ALLEGRA) 180 MG tablet Take 180 mg by mouth daily. As needed  . levothyroxine (SYNTHROID,  LEVOTHROID) 112 MCG tablet TAKE 1 TABLET (112 MCG TOTAL) BY MOUTH DAILY.  Marland Kitchen lisinopril (PRINIVIL,ZESTRIL) 40 MG tablet TAKE 1 TABLET (40 MG TOTAL) BY MOUTH DAILY.  Marland Kitchen LORazepam (ATIVAN) 1 MG tablet TAKE 1 TABLET BY MOUTH AT BEDTIME AS NEEDED FOR ANXIETY  . metoprolol tartrate (LOPRESSOR) 25 MG tablet Take 1 tablet (25 mg total) by mouth 2 (two) times daily.  . naproxen sodium (ANAPROX) 220 MG tablet Take by mouth.  . nitroGLYCERIN (NITROSTAT) 0.4 MG SL tablet Place 0.4 mg under the tongue every 5 (five) minutes as needed. If a third tablet is needed please call 911.  . pantoprazole (PROTONIX) 40 MG tablet TAKE 1 TABLET (40 MG TOTAL) BY MOUTH DAILY.  . rosuvastatin (CRESTOR) 20 MG tablet TAKE 1 TABLET (20 MG TOTAL) BY MOUTH NIGHTLY.   No facility-administered encounter medications on file as of 05/03/2017.     Review of Systems  Constitutional: Negative for appetite change and unexpected weight change.  HENT: Negative for congestion and sinus pressure.   Respiratory: Negative for cough, chest tightness and shortness of breath.   Cardiovascular: Negative for chest pain, palpitations and leg swelling.  Gastrointestinal: Negative for abdominal pain, diarrhea, nausea and vomiting.  Genitourinary: Negative for difficulty urinating and dysuria.  Musculoskeletal: Negative for myalgias.       Knees doing better.    Skin: Negative for color change and rash.  Neurological: Negative for dizziness, light-headedness and headaches.  Psychiatric/Behavioral: Negative for agitation and dysphoric mood.       Increased stress as outlined.         Objective:     Blood pressure rechecked by me:  142/84  Physical Exam  Constitutional: He appears well-developed and well-nourished. No distress.  HENT:  Nose: Nose normal.  Mouth/Throat: Oropharynx is clear and moist.  Eyes: Conjunctivae are normal. Right eye exhibits no discharge. Left eye exhibits no discharge.  Neck: Neck supple. No thyromegaly present.    Cardiovascular: Normal rate and regular rhythm.   Pulmonary/Chest: Effort normal and breath sounds normal. No respiratory distress.  Abdominal: Soft. Bowel sounds are normal. There is no tenderness.  Musculoskeletal: He exhibits no edema or tenderness.  Lymphadenopathy:    He has no cervical adenopathy.  Skin: No rash noted. No erythema.  Psychiatric: He  has a normal mood and affect. His behavior is normal.    BP (!) 142/84   Pulse 91   Temp 98.1 F (36.7 C) (Oral)   Ht '5\' 9"'  (1.753 m)   Wt 213 lb (96.6 kg)   SpO2 96%   BMI 31.45 kg/m  Wt Readings from Last 3 Encounters:  05/03/17 213 lb (96.6 kg)  01/10/17 216 lb 3.2 oz (98.1 kg)  11/09/16 210 lb (95.3 kg)     Lab Results  Component Value Date   WBC 5.6 01/07/2017   HGB 13.3 01/07/2017   HCT 40.3 01/07/2017   PLT 228.0 01/07/2017   GLUCOSE 95 01/07/2017   CHOL 138 01/07/2017   TRIG 115.0 01/07/2017   HDL 45.30 01/07/2017   LDLCALC 70 01/07/2017   ALT 7 01/07/2017   AST 13 01/07/2017   NA 137 01/07/2017   K 4.4 01/07/2017   CL 102 01/07/2017   CREATININE 1.00 01/07/2017   BUN 8 01/07/2017   CO2 29 01/07/2017   TSH 3.44 09/03/2016   PSA 0.33 08/26/2015   INR 1.13 06/11/2016   HGBA1C 6.1 01/07/2017   MICROALBUR 1.6 05/25/2014       Assessment & Plan:   Problem List Items Addressed This Visit    Anemia    Has had EGD and colonoscopy 2012.  Last hgb wnl.  Follow cbc and ferritin.        Relevant Orders   CBC with Differential/Platelet   Ferritin   Carotid artery stenosis    Carotid duplex 11/09/16 with ICA (left) 1-39 and right patent.  Recommended f/u in one year.        Coronary atherosclerosis of native coronary artery    Remains on plavix and aspirin.  Stable.  Continue risk factor modification.        Essential hypertension, benign    Blood pressure initially elevated.  Recheck improved.  Continue same medication regimen.  Follow pressures.  Follow metabolic panel.  Have him spot check his  pressure.  Get him back in soon to reassess.        Relevant Orders   Basic metabolic panel   Hyperglycemia    Low carb diet and exercise.  Follow met b and a1c.        Relevant Orders   Hemoglobin A1c   Hypothyroidism    On thyroid replacement.  Follow tsh.        Relevant Orders   TSH   Nonsustained ventricular tachycardia ([place])    If followed by cardiology.  No recent problems.  Follow.        Pure hypercholesterolemia    On crestor.  Low cholesterol diet and exercise.  Follow lipid panel and liver function tests.        Relevant Orders   Hepatic function panel   Lipid panel   S/P total knee arthroplasty    Doing well.  More active.  Follow.        Stress    Increased stress as outlined.  Discussed with him today.  He desires no further evaluation at this time.            Einar Pheasant, MD

## 2017-05-05 ENCOUNTER — Encounter: Payer: Self-pay | Admitting: Internal Medicine

## 2017-05-05 DIAGNOSIS — F439 Reaction to severe stress, unspecified: Secondary | ICD-10-CM | POA: Insufficient documentation

## 2017-05-05 NOTE — Assessment & Plan Note (Signed)
On thyroid replacement.  Follow tsh.  

## 2017-05-05 NOTE — Assessment & Plan Note (Signed)
Increased stress as outlined.  Discussed with him today.  He desires no further evaluation at this time.

## 2017-05-05 NOTE — Assessment & Plan Note (Signed)
Has had EGD and colonoscopy 2012.  Last hgb wnl.  Follow cbc and ferritin.

## 2017-05-05 NOTE — Assessment & Plan Note (Signed)
On crestor.  Low cholesterol diet and exercise.  Follow lipid panel and liver function tests.   

## 2017-05-05 NOTE — Assessment & Plan Note (Addendum)
Low carb diet and exercise.  Follow met b and a1c.   

## 2017-05-05 NOTE — Assessment & Plan Note (Signed)
Blood pressure initially elevated.  Recheck improved.  Continue same medication regimen.  Follow pressures.  Follow metabolic panel.  Have him spot check his pressure.  Get him back in soon to reassess.

## 2017-05-05 NOTE — Assessment & Plan Note (Signed)
If followed by cardiology.  No recent problems.  Follow.

## 2017-05-05 NOTE — Assessment & Plan Note (Signed)
Carotid duplex 11/09/16 with ICA (left) 1-39 and right patent.  Recommended f/u in one year.   

## 2017-05-05 NOTE — Assessment & Plan Note (Signed)
Remains on plavix and aspirin.  Stable.  Continue risk factor modification.

## 2017-05-05 NOTE — Assessment & Plan Note (Signed)
Doing well.  More active.  Follow.

## 2017-05-13 ENCOUNTER — Ambulatory Visit: Payer: PRIVATE HEALTH INSURANCE | Admitting: Internal Medicine

## 2017-05-27 ENCOUNTER — Other Ambulatory Visit: Payer: Self-pay | Admitting: Internal Medicine

## 2017-05-28 NOTE — Telephone Encounter (Signed)
Last OV 05/03/2017 Next OV 06/19/2017 Last refill 03/25/2017

## 2017-06-03 ENCOUNTER — Other Ambulatory Visit: Payer: Self-pay | Admitting: Internal Medicine

## 2017-06-10 ENCOUNTER — Ambulatory Visit: Payer: Medicare Other | Admitting: Podiatry

## 2017-06-17 ENCOUNTER — Other Ambulatory Visit: Payer: PRIVATE HEALTH INSURANCE

## 2017-06-19 ENCOUNTER — Ambulatory Visit: Payer: PRIVATE HEALTH INSURANCE | Admitting: Internal Medicine

## 2017-07-02 ENCOUNTER — Other Ambulatory Visit: Payer: Self-pay | Admitting: Internal Medicine

## 2017-07-12 ENCOUNTER — Other Ambulatory Visit: Payer: Self-pay | Admitting: Internal Medicine

## 2017-07-29 ENCOUNTER — Other Ambulatory Visit: Payer: Self-pay

## 2017-07-29 NOTE — Telephone Encounter (Signed)
Schedule f/u appt and refill until appt.

## 2017-07-29 NOTE — Telephone Encounter (Signed)
Fax received for refill cancelled 9/26 and 8/20 app. Last o/v was 8/10 and no follow up app has been made at this time

## 2017-07-30 ENCOUNTER — Other Ambulatory Visit: Payer: Self-pay

## 2017-07-30 NOTE — Telephone Encounter (Signed)
Mail box full not able to lave v/m

## 2017-08-05 ENCOUNTER — Other Ambulatory Visit: Payer: Self-pay | Admitting: Internal Medicine

## 2017-08-05 NOTE — Telephone Encounter (Signed)
Called patient not able to leave v/m at 05/03/17 app should have made f/u for 4 months need to make app.

## 2017-08-07 NOTE — Telephone Encounter (Signed)
Have you heard from patient .

## 2017-08-08 NOTE — Telephone Encounter (Signed)
Was not sent in last message was error. Last app in office was 05/03/17. Has cancelled last two app with our office. I have called several times to make f/u and not able to leave v/m

## 2017-08-08 NOTE — Telephone Encounter (Signed)
Controlled substance 

## 2017-08-08 NOTE — Telephone Encounter (Signed)
Patient called and said the pharmacy has not received the script for the LORAZEPAM 1mg . He said he should have been ready by Wednesday for pickup. Pharmacy is [facility].

## 2017-08-08 NOTE — Telephone Encounter (Signed)
Looks like refills have been sent.

## 2017-08-09 ENCOUNTER — Other Ambulatory Visit: Payer: Self-pay | Admitting: Internal Medicine

## 2017-08-09 NOTE — Telephone Encounter (Signed)
I have ok'd refill for lisinopril #90 with no refills and lorazepam #30 with no refills.  Needs f/u appt.  If unable to reach, can call contact number he has ok'd or send letter.

## 2017-08-09 NOTE — Telephone Encounter (Signed)
Refill  Request  For   Lorazepam    And  Alternative to his  thyriod  Med

## 2017-08-09 NOTE — Telephone Encounter (Signed)
Both have been sent to pharmacy 

## 2017-08-09 NOTE — Telephone Encounter (Signed)
Copied from [place] [id number]. Topic: Inquiry >> Aug 09, 2017 12:47 PM Cecelia Byars, NT wrote: Reason for CRM: Patient needs a refill for lorazepam, and a alternative for levethryoxine 112 mg [facility]

## 2017-08-19 ENCOUNTER — Telehealth: Payer: Self-pay | Admitting: Internal Medicine

## 2017-08-19 MED ORDER — LEVOTHYROXINE SODIUM 112 MCG PO TABS: 112.0000 ug | ORAL_TABLET | Freq: Every day | ORAL | 3 refills | Status: DC

## 2017-08-19 NOTE — Telephone Encounter (Signed)
rx for synthroid sent to pharmacy. Patient aware

## 2017-08-19 NOTE — Addendum Note (Signed)
Addended by: Lars Masson on: 08/19/2017 05:10 PM   Modules accepted: Orders

## 2017-08-19 NOTE — Telephone Encounter (Signed)
Please advise 

## 2017-08-19 NOTE — Telephone Encounter (Signed)
Can do name brand synthroid 151mcg q day.  (#30 with 3 refills).

## 2017-08-19 NOTE — Telephone Encounter (Signed)
Copied from [place] [id number]. Topic: Inquiry >> Aug 09, 2017 12:47 PM Cecelia Byars, NT wrote: Reason for CRM: Patient needs a refill for lorazepam, and a alternative for levethryoxine 112 mg [facility]  >> Aug 19, 2017  9:42 AM Lennox Solders wrote: Pt is still waiting on replace for levothyroxine 112 mg. Pt said this medication has been recalled. [facility] [phone]  / I called and spoke with pharmacy.  Levothyroxine is on back ordered.  They have no idea when it will be in and available. / Is there something else the patient can take in the mean time. / [facility] [place] [facility]

## 2017-08-19 NOTE — Telephone Encounter (Signed)
Pt is calling in regards to a alternative for his medication.

## 2017-08-19 NOTE — Telephone Encounter (Signed)
Lorazepam was refilled on 08/09/2017 but patient is wanting alternative for levothyroxine. Please advise

## 2017-08-20 ENCOUNTER — Telehealth: Payer: Self-pay | Admitting: Internal Medicine

## 2017-08-20 NOTE — Telephone Encounter (Signed)
Pharmacy faxed alternative request for Levothyroxine 112 MCG. Medication is on back order.

## 2017-08-20 NOTE — Telephone Encounter (Signed)
See previous message.  D/w Larena Glassman yesterday.  Name brand synthroid sent in.  Please clarify with pharmacy if received.

## 2017-08-20 NOTE — Telephone Encounter (Signed)
Have tried patient number and patient wife number voicemail is full sending letter needs to schedule appointment.

## 2017-08-20 NOTE — Telephone Encounter (Signed)
Script sent in for name brand only.

## 2017-09-07 ENCOUNTER — Other Ambulatory Visit: Payer: Self-pay | Admitting: Internal Medicine

## 2017-09-09 DIAGNOSIS — I2510 Atherosclerotic heart disease of native coronary artery without angina pectoris: Secondary | ICD-10-CM | POA: Diagnosis not present

## 2017-09-09 DIAGNOSIS — I10 Essential (primary) hypertension: Secondary | ICD-10-CM | POA: Diagnosis not present

## 2017-09-09 DIAGNOSIS — I472 Ventricular tachycardia: Secondary | ICD-10-CM | POA: Diagnosis not present

## 2017-09-09 DIAGNOSIS — I255 Ischemic cardiomyopathy: Secondary | ICD-10-CM | POA: Diagnosis not present

## 2017-09-09 DIAGNOSIS — I340 Nonrheumatic mitral (valve) insufficiency: Secondary | ICD-10-CM | POA: Diagnosis not present

## 2017-09-09 DIAGNOSIS — E785 Hyperlipidemia, unspecified: Secondary | ICD-10-CM | POA: Diagnosis not present

## 2017-09-09 DIAGNOSIS — I341 Nonrheumatic mitral (valve) prolapse: Secondary | ICD-10-CM | POA: Diagnosis not present

## 2017-09-09 DIAGNOSIS — Z9889 Other specified postprocedural states: Secondary | ICD-10-CM | POA: Diagnosis not present

## 2017-09-09 DIAGNOSIS — I5022 Chronic systolic (congestive) heart failure: Secondary | ICD-10-CM | POA: Diagnosis not present

## 2017-09-09 NOTE — Telephone Encounter (Signed)
Refilled: 08/09/2017 Last OV: 05/03/2017 Next OV: 12/05/2017

## 2017-09-09 NOTE — Telephone Encounter (Signed)
ok'd rx for lorazepam #30 with one refill.

## 2017-09-12 ENCOUNTER — Ambulatory Visit (INDEPENDENT_AMBULATORY_CARE_PROVIDER_SITE_OTHER): Payer: Medicare Other

## 2017-09-12 VITALS — BP 120/68 | HR 84 | Temp 98.2°F | Resp 14 | Ht 68.0 in | Wt 206.0 lb

## 2017-09-12 DIAGNOSIS — R739 Hyperglycemia, unspecified: Secondary | ICD-10-CM | POA: Diagnosis not present

## 2017-09-12 DIAGNOSIS — Z1331 Encounter for screening for depression: Secondary | ICD-10-CM

## 2017-09-12 DIAGNOSIS — I10 Essential (primary) hypertension: Secondary | ICD-10-CM | POA: Diagnosis not present

## 2017-09-12 DIAGNOSIS — D649 Anemia, unspecified: Secondary | ICD-10-CM | POA: Diagnosis not present

## 2017-09-12 DIAGNOSIS — Z0000 Encounter for general adult medical examination without abnormal findings: Secondary | ICD-10-CM | POA: Diagnosis not present

## 2017-09-12 DIAGNOSIS — E7800 Pure hypercholesterolemia, unspecified: Secondary | ICD-10-CM | POA: Diagnosis not present

## 2017-09-12 DIAGNOSIS — E039 Hypothyroidism, unspecified: Secondary | ICD-10-CM | POA: Diagnosis not present

## 2017-09-12 LAB — CBC WITH DIFFERENTIAL/PLATELET
BASOS PCT: 1.3 % (ref 0.0–3.0)
Basophils Absolute: 0.1 10*3/uL (ref 0.0–0.1)
EOS ABS: 0.4 10*3/uL (ref 0.0–0.7)
EOS PCT: 9.1 % — AB (ref 0.0–5.0)
HCT: 40.5 % (ref 39.0–52.0)
Hemoglobin: 13.4 g/dL (ref 13.0–17.0)
LYMPHS ABS: 1.2 10*3/uL (ref 0.7–4.0)
Lymphocytes Relative: 27.9 % (ref 12.0–46.0)
MCHC: 33 g/dL (ref 30.0–36.0)
MCV: 91 fl (ref 78.0–100.0)
MONO ABS: 0.5 10*3/uL (ref 0.1–1.0)
Monocytes Relative: 11.2 % (ref 3.0–12.0)
NEUTROS PCT: 50.5 % (ref 43.0–77.0)
Neutro Abs: 2.3 10*3/uL (ref 1.4–7.7)
Platelets: 228 10*3/uL (ref 150.0–400.0)
RBC: 4.45 Mil/uL (ref 4.22–5.81)
RDW: 13.8 % (ref 11.5–15.5)
WBC: 4.5 10*3/uL (ref 4.0–10.5)

## 2017-09-12 LAB — HEPATIC FUNCTION PANEL
ALT: 7 U/L (ref 0–53)
AST: 15 U/L (ref 0–37)
Albumin: 4.3 g/dL (ref 3.5–5.2)
Alkaline Phosphatase: 88 U/L (ref 39–117)
BILIRUBIN DIRECT: 0.2 mg/dL (ref 0.0–0.3)
BILIRUBIN TOTAL: 0.9 mg/dL (ref 0.2–1.2)
TOTAL PROTEIN: 7.3 g/dL (ref 6.0–8.3)

## 2017-09-12 LAB — BASIC METABOLIC PANEL
BUN: 10 mg/dL (ref 6–23)
CALCIUM: 9 mg/dL (ref 8.4–10.5)
CO2: 31 meq/L (ref 19–32)
Chloride: 101 mEq/L (ref 96–112)
Creatinine, Ser: 0.92 mg/dL (ref 0.40–1.50)
GFR: 84.72 mL/min (ref >60.00)
GLUCOSE: 95 mg/dL (ref 70–99)
POTASSIUM: 4.3 meq/L (ref 3.5–5.1)
SODIUM: 138 meq/L (ref 135–145)

## 2017-09-12 LAB — LIPID PANEL
CHOLESTEROL: 108 mg/dL (ref 0–200)
HDL: 45.8 mg/dL (ref >39.00)
LDL CALC: 43 mg/dL (ref 0–99)
NonHDL: 62.64
TRIGLYCERIDES: 97 mg/dL (ref 0.0–149.0)
Total CHOL/HDL Ratio: 2
VLDL: 19.4 mg/dL (ref 0.0–40.0)

## 2017-09-12 LAB — HEMOGLOBIN A1C: Hgb A1c MFr Bld: 5.9 % (ref 4.6–6.5)

## 2017-09-12 LAB — TSH: TSH: 8.06 u[IU]/mL — ABNORMAL HIGH (ref 0.35–4.50)

## 2017-09-12 LAB — FERRITIN: FERRITIN: 79 ng/mL (ref 22.0–322.0)

## 2017-09-12 NOTE — Progress Notes (Signed)
Subjective:   Daniel Austin is a 76 y.o. male who presents for Medicare Annual/Subsequent preventive examination.  Review of Systems:  No ROS.  Medicare Wellness Visit. Additional risk factors are reflected in the social history.  Cardiac Risk Factors include: advanced age (>101men, >86 women);hypertension;male gender;obesity (BMI >30kg/m2)     Objective:    Vitals: BP 120/68 (BP Location: Left Arm, Patient Position: Sitting, Cuff Size: Normal)   Pulse 84   Temp 98.2 F (36.8 C) (Oral)   Resp 14   Ht 5\' 8"  (1.727 m)   Wt 206 lb (93.4 kg)   SpO2 98%   BMI 31.32 kg/m   Body mass index is 31.32 kg/m.  Advanced Directives 09/12/2017 11/09/2016 09/11/2016 06/27/2016 06/27/2016 06/11/2016 02/08/2016  Does Patient Have a Medical Advance Directive? No No No No No No No  Would patient like information on creating a medical advance directive? Yes (MAU/Ambulatory/Procedural Areas - Information given) - No - Patient declined No - patient declined information - - No - patient declined information    Tobacco Social History   Tobacco Use  Smoking Status Never Smoker  Smokeless Tobacco Never Used     Counseling given: Not Answered   Clinical Intake:  Pre-visit preparation completed: Yes  Pain : No/denies pain     Nutritional Status: BMI > 30  Obese Diabetes: No  How often do you need to have someone help you when you read instructions, pamphlets, or other written materials from your doctor or pharmacy?: 1 - Never  Interpreter Needed?: No     Past Medical History:  Diagnosis Date  . Anemia   . Anxiety   . Arthritis   . CAD (coronary artery disease)   . Cardiomyopathy ([place])   . Carotid artery stenosis    high-grade symptomatic right sided s/p stent twice  . CHF (congestive heart failure) (HCC)    chronic movement fluid and history of CHF he noticed in the   . Depression   . Fractures, multiple    Metal rod in left foot  . GERD (gastroesophageal reflux disease)     . Heart block   . Hypercholesterolemia   . Hyperglycemia   . Hypertension   . Hypothyroidism   . Knee pain   . Presence of permanent cardiac pacemaker   . Shortness of breath dyspnea   . Stroke [facility])    clot in neck broke off left side weakness 2008  resolved  . Syncope    second degree heart block s/p St Jude's pacemaker placement 12/08 with his history is limited with any  . Ulcer of gastric fundus    Past Surgical History:  Procedure Laterality Date  . CARDIAC CATHETERIZATION    . CORONARY ANGIOPLASTY     stents x 3  . heart stint  11/04/2014   [facility]  . HERNIA REPAIR     inguinal  . INSERT / REPLACE / REMOVE PACEMAKER    . JOINT REPLACEMENT     right knee  . KNEE ARTHROPLASTY Right 02/08/2016   Procedure: COMPUTER ASSISTED TOTAL KNEE ARTHROPLASTY;  Surgeon: Dereck Leep, MD;  Location: [facility] ORS;  Service: Orthopedics;  Laterality: Right;  . KNEE ARTHROPLASTY Left 06/27/2016   Procedure: COMPUTER ASSISTED TOTAL KNEE ARTHROPLASTY;  Surgeon: Dereck Leep, MD;  Location: [facility] ORS;  Service: Orthopedics;  Laterality: Left;  . PACEMAKER INSERTION  08/2007  . PACEMAKER INSERTION N/A 11/16/2015   Procedure: INSERTION PACEMAKER/ PACEMAKER CHANGE OUT;  Surgeon: Isaias Cowman,  MD;  Location: [facility] ORS;  Service: Cardiovascular;  Laterality: N/A;  . PERCUTANEOUS PLACEMENT INTRAVASCULAR STENT CERVICAL CAROTID ARTERY     twice, right carotid artery   Family History  Problem Relation Age of Onset  . Heart disease Mother        CHF  . Heart disease Father        CHF  . Thyroid disease Father        hypothyroidism  . Colon polyps Father   . Diabetes Sister        diet controlled  . Thyroid disease Sister   . Cancer Sister        breast   . Thyroid disease Sister   . Cancer Sister        breast  . Colon cancer Neg Hx   . Prostate cancer Neg Hx    Social History   Socioeconomic History  . Marital status: Married    Spouse name: None  . Number of children: 1   . Years of education: None  . Highest education level: None  Social Needs  . Financial resource strain: Not hard at all  . Food insecurity - worry: Never true  . Food insecurity - inability: Never true  . Transportation needs - medical: No  . Transportation needs - non-medical: No  Occupational History  . None  Tobacco Use  . Smoking status: Never Smoker  . Smokeless tobacco: Never Used  Substance and Sexual Activity  . Alcohol use: No    Alcohol/week: 0.0 oz  . Drug use: No  . Sexual activity: Not Currently  Other Topics Concern  . None  Social History Narrative  . None    Outpatient Encounter Medications as of 09/12/2017  Medication Sig  . acetaminophen (TYLENOL) 500 MG tablet Take 1,000 mg by mouth every 6 (six) hours as needed.  . Aspirin 81 MG EC tablet Take 81 mg by mouth daily.  . citalopram (CELEXA) 20 MG tablet TAKE 1 TABLET (20 MG TOTAL) BY MOUTH DAILY.  Marland Kitchen clopidogrel (PLAVIX) 75 MG tablet TAKE 1 TABLET (75 MG TOTAL) BY MOUTH DAILY.  . fexofenadine (ALLEGRA) 180 MG tablet Take 180 mg by mouth daily. As needed  . levothyroxine (SYNTHROID) 112 MCG tablet Take 1 tablet (112 mcg total) by mouth daily before breakfast.  . lisinopril (PRINIVIL,ZESTRIL) 40 MG tablet TAKE 1 TABLET (40 MG TOTAL) BY MOUTH DAILY.  Marland Kitchen LORazepam (ATIVAN) 1 MG tablet TAKE 1 TABLET BY MOUTH AT BEDTIME AS NEEDED FOR ANXIETY  . metoprolol tartrate (LOPRESSOR) 25 MG tablet Take 1 tablet (25 mg total) by mouth 2 (two) times daily.  . naproxen sodium (ANAPROX) 220 MG tablet Take by mouth.  . nitroGLYCERIN (NITROSTAT) 0.4 MG SL tablet Place 0.4 mg under the tongue every 5 (five) minutes as needed. If a third tablet is needed please call 911.  . pantoprazole (PROTONIX) 40 MG tablet TAKE 1 TABLET (40 MG TOTAL) BY MOUTH DAILY.  . rosuvastatin (CRESTOR) 20 MG tablet TAKE 1 TABLET (20 MG TOTAL) BY MOUTH NIGHTLY.  . [DISCONTINUED] citalopram (CELEXA) 20 MG tablet TAKE 1 TABLET (20 MG TOTAL) BY MOUTH DAILY.    No facility-administered encounter medications on file as of 09/12/2017.     Activities of Daily Living In your present state of health, do you have any difficulty performing the following activities: 09/12/2017  Hearing? N  Vision? N  Difficulty concentrating or making decisions? N  Walking or climbing stairs? Y  Dressing or bathing? N  Doing errands, shopping? N  Preparing Food and eating ? N  Using the Toilet? N  In the past six months, have you accidently leaked urine? N  Do you have problems with loss of bowel control? N  Managing your Medications? N  Managing your Finances? N  Housekeeping or managing your Housekeeping? N  Some recent data might be hidden    Patient Care Team: Einar Pheasant, MD as PCP - General (Internal Medicine)   Assessment:   This is a routine wellness examination for Lory. The goal of the wellness visit is to assist the patient how to close the gaps in care and create a preventative care plan for the patient.   The roster of all physicians providing medical care to patient is listed in the Snapshot section of the chart.  Taking calcium VIT D as appropriate/Osteoporosis risk reviewed.    Safety issues reviewed; Smoke and carbon monoxide detectors in the home. No firearms in the home.  Wears seatbelts when driving or riding with others. Patient does wear sunscreen or protective clothing when in direct sunlight. No violence in the home.  Depression- PHQ 2 &9 complete.  No signs/symptoms or verbal communication regarding little pleasure in doing things, feeling down, depressed or hopeless. No changes in sleeping, energy, eating, concentrating.  No thoughts of self harm or harm towards others.  Time spent on this topic is 8 minutes.   Patient is alert, normal appearance, oriented to person/place/and time. Correctly identified the president of the [place], recall of 3/3 words, and performing simple calculations. Displays appropriate judgement and can  read correct time from watch face.   No new identified risk were noted.  No failures at ADL's or IADL's.    BMI- discussed the importance of a healthy diet, water intake and the benefits of aerobic exercise. Educational material provided. Actively trying to lose weight with a healthy diet.  Overall 8lb weight loss within the last 8 months.  He plans to start attending exercise classes through the [employer] program.  24 hour diet recall: Low sodium, low carb diet  Daily fluid intake: 1 cups of caffeine, 8-10 cups of water.  Dental- every 6 months.  Dr. Jacobo Forest.  Eye- Visual acuity not assessed per patient preference since they have regular follow up with the ophthalmologist. L eye blindness.  Wears corrective lenses.  Sleep patterns- Sleeps 8 hours at night.  Wakes feeling rested. CPAP in use.  Influenza vaccine discussed; he would like to receive at his next visit.  Last administered 10/2016.  Fasting labs completed today.   Surgery; knee arthroplasty.  Recovering well.  HTN; followed by PCP.  Spot check blood pressure at home; bring record to next visit.  Patient Concerns: None at this time. Follow up with PCP as needed.  Exercise Activities and Dietary recommendations Current Exercise Habits: The patient does not participate in regular exercise at present  Goals    . Increase physical activity     Start the [employer] sneaker program and increase exercise as tolerated. Walk for exercise as tolerated.        Fall Risk Fall Risk  09/12/2017 05/03/2017 09/11/2016 09/07/2016 01/07/2015  Falls in the past year? No No No No No   Depression Screen PHQ 2/9 Scores 09/12/2017 05/03/2017 09/11/2016 09/07/2016  PHQ - 2 Score 0 0 1 1  PHQ- 9 Score 0 - - -    Cognitive Function MMSE - Mini Mental State Exam 09/12/2017  Orientation to time 5  Orientation  to Place 5  Registration 3  Attention/ Calculation 5  Recall 3  Language- name 2 objects 2  Language- repeat 1   Language- follow 3 step command 3  Language- read & follow direction 1  Write a sentence 1  Copy design 1  Total score 30     6CIT Screen 09/11/2016  What Year? 0 points  What month? 0 points  What time? 0 points  Count back from 20 0 points  Months in reverse 0 points    Immunization History  Administered Date(s) Administered  . DTaP 11/04/2006  . Influenza Nasal 09/03/2012, 07/06/2013  . Influenza, High Dose Seasonal PF 10/25/2016  . Influenza,inj,Quad PF,6+ Mos 05/28/2014, 05/25/2015  . Pneumococcal Conjugate-13 01/10/2017    Screening Tests Health Maintenance  Topic Date Due  . INFLUENZA VACCINE  04/24/2017  . TETANUS/TDAP  09/23/2017 (Originally 05/18/1960)  . PNA vac Low Risk Adult (2 of 2 - PPSV23) 01/10/2018       Plan:   End of life planning; Advanced aging; Advanced directives discussed.  No HCPOA/Living Will.  Additional information provided to help them start the conversation with family.  Copy of HCPOA/Living Will requested upon completion. Time spent on this topic is 20 minutes.  I have personally reviewed and noted the following in the patient's chart:   . Medical and social history . Use of alcohol, tobacco or illicit drugs  . Current medications and supplements . Functional ability and status . Nutritional status . Physical activity . Advanced directives . List of other physicians . Hospitalizations, surgeries, and ER visits in previous 12 months . Vitals . Screenings to include cognitive, depression, and falls . Referrals and appointments  In addition, I have reviewed and discussed with patient certain preventive protocols, quality metrics, and best practice recommendations. A written personalized care plan for preventive services as well as general preventive health recommendations were provided to patient.     Varney Biles, LPN  54/27/0623   Reviewed above information.  Agree with assessment and plan.   Dr Nicki Reaper

## 2017-09-12 NOTE — Patient Instructions (Addendum)
  [name] , Thank you for taking time to come for your Medicare Wellness Visit. I appreciate your ongoing commitment to your health goals. Please review the following plan we discussed and let me know if I can assist you in the future.   Follow up with Dr. Nicki Austin.  Bring a copy of your [place] and/or Living Will to be scanned into chart once completed.  ATIVAN sent to your pharmacy on 09/09/17 for 30 days with 1 refill.  Labs today.  Have a great day and Merry Christmas!  These are the goals we discussed: Goals    . Increase physical activity     Start the silver sneaker program and increase exercise as tolerated. Walk for exercise as tolerated.        This is a list of the screening recommended for you and due dates:  Health Maintenance  Topic Date Due  . Flu Shot  04/24/2017  . Tetanus Vaccine  09/23/2017*  . Pneumonia vaccines (2 of 2 - PPSV23) 01/10/2018  *Topic was postponed. The date shown is not the original due date.

## 2017-09-13 ENCOUNTER — Encounter: Payer: Self-pay | Admitting: *Deleted

## 2017-09-25 ENCOUNTER — Ambulatory Visit (INDEPENDENT_AMBULATORY_CARE_PROVIDER_SITE_OTHER): Payer: Medicare Other | Admitting: Internal Medicine

## 2017-09-25 ENCOUNTER — Encounter: Payer: Self-pay | Admitting: Internal Medicine

## 2017-09-25 DIAGNOSIS — R739 Hyperglycemia, unspecified: Secondary | ICD-10-CM | POA: Diagnosis not present

## 2017-09-25 DIAGNOSIS — E039 Hypothyroidism, unspecified: Secondary | ICD-10-CM | POA: Diagnosis not present

## 2017-09-25 DIAGNOSIS — Z23 Encounter for immunization: Secondary | ICD-10-CM

## 2017-09-25 DIAGNOSIS — Z96651 Presence of right artificial knee joint: Secondary | ICD-10-CM

## 2017-09-25 DIAGNOSIS — I2510 Atherosclerotic heart disease of native coronary artery without angina pectoris: Secondary | ICD-10-CM

## 2017-09-25 DIAGNOSIS — E7800 Pure hypercholesterolemia, unspecified: Secondary | ICD-10-CM | POA: Diagnosis not present

## 2017-09-25 DIAGNOSIS — D649 Anemia, unspecified: Secondary | ICD-10-CM | POA: Diagnosis not present

## 2017-09-25 DIAGNOSIS — I6523 Occlusion and stenosis of bilateral carotid arteries: Secondary | ICD-10-CM

## 2017-09-25 DIAGNOSIS — I10 Essential (primary) hypertension: Secondary | ICD-10-CM | POA: Diagnosis not present

## 2017-09-25 MED ORDER — AMLODIPINE BESYLATE 2.5 MG PO TABS
2.5000 mg | ORAL_TABLET | Freq: Every day | ORAL | 2 refills | Status: DC
Start: 2017-09-25 — End: 2017-12-05

## 2017-09-25 MED ORDER — TRIAMCINOLONE ACETONIDE 0.1 % EX CREA: 1.0000 "application " | TOPICAL_CREAM | Freq: Two times a day (BID) | CUTANEOUS | 0 refills | Status: AC

## 2017-09-25 NOTE — Progress Notes (Signed)
Patient ID: Daniel Austin, male   DOB: August 09, 1941, 77 y.o.   MRN: [id number]   Subjective:    Patient ID: Daniel Austin, male    DOB: 1941-07-26, 77 y.o.   MRN: [id number]  HPI  Patient here for a scheduled follow up.  Here to f/u on his blood pressure.  States he is doing relatively well.  Knees much better s/p surgery.  Able to walk and get around better.  Tries to stay active.  No chest pain. No sob.  No acid reflux.  No abdominal pain.  Bowels moving.  Has been monitoring his blood pressure.  Blood pressure on outside checks averaging 140-150s/80-90.  Taking medication.  Discussed recent labs.  Had been out of his thyroid medication for approximately one month.  Back on now.  Will plan for f/u tsh with next labs.    Past Medical History:  Diagnosis Date  . Anemia   . Anxiety   . Arthritis   . CAD (coronary artery disease)   . Cardiomyopathy ([place])   . Carotid artery stenosis    high-grade symptomatic right sided s/p stent twice  . CHF (congestive heart failure) (HCC)    chronic movement fluid and history of CHF he noticed in the   . Depression   . Fractures, multiple    Metal rod in left foot  . GERD (gastroesophageal reflux disease)   . Heart block   . Hypercholesterolemia   . Hyperglycemia   . Hypertension   . Hypothyroidism   . Knee pain   . Presence of permanent cardiac pacemaker   . Shortness of breath dyspnea   . Stroke [facility])    clot in neck broke off left side weakness 2008  resolved  . Syncope    second degree heart block s/p St Jude's pacemaker placement 12/08 with his history is limited with any  . Ulcer of gastric fundus    Past Surgical History:  Procedure Laterality Date  . CARDIAC CATHETERIZATION    . CORONARY ANGIOPLASTY     stents x 3  . heart stint  11/04/2014   [facility]  . HERNIA REPAIR     inguinal  . INSERT / REPLACE / REMOVE PACEMAKER    . JOINT REPLACEMENT     right knee  . KNEE ARTHROPLASTY Right 02/08/2016   Procedure: COMPUTER  ASSISTED TOTAL KNEE ARTHROPLASTY;  Surgeon: Dereck Leep, MD;  Location: [facility] ORS;  Service: Orthopedics;  Laterality: Right;  . KNEE ARTHROPLASTY Left 06/27/2016   Procedure: COMPUTER ASSISTED TOTAL KNEE ARTHROPLASTY;  Surgeon: Dereck Leep, MD;  Location: [facility] ORS;  Service: Orthopedics;  Laterality: Left;  . PACEMAKER INSERTION  08/2007  . PACEMAKER INSERTION N/A 11/16/2015   Procedure: INSERTION PACEMAKER/ PACEMAKER CHANGE OUT;  Surgeon: Isaias Cowman, MD;  Location: [facility] ORS;  Service: Cardiovascular;  Laterality: N/A;  . PERCUTANEOUS PLACEMENT INTRAVASCULAR STENT CERVICAL CAROTID ARTERY     twice, right carotid artery   Family History  Problem Relation Age of Onset  . Heart disease Mother        CHF  . Heart disease Father        CHF  . Thyroid disease Father        hypothyroidism  . Colon polyps Father   . Diabetes Sister        diet controlled  . Thyroid disease Sister   . Cancer Sister        breast   . Thyroid disease Sister   .  Cancer Sister        breast  . Colon cancer Neg Hx   . Prostate cancer Neg Hx    Social History   Socioeconomic History  . Marital status: Married    Spouse name: None  . Number of children: 1  . Years of education: None  . Highest education level: None  Social Needs  . Financial resource strain: Not hard at all  . Food insecurity - worry: Never true  . Food insecurity - inability: Never true  . Transportation needs - medical: No  . Transportation needs - non-medical: No  Occupational History  . None  Tobacco Use  . Smoking status: Never Smoker  . Smokeless tobacco: Never Used  Substance and Sexual Activity  . Alcohol use: No    Alcohol/week: 0.0 oz  . Drug use: No  . Sexual activity: Not Currently  Other Topics Concern  . None  Social History Narrative  . None    Outpatient Encounter Medications as of 09/25/2017  Medication Sig  . acetaminophen (TYLENOL) 500 MG tablet Take 1,000 mg by mouth every 6 (six) hours as  needed.  Marland Kitchen amLODipine (NORVASC) 2.5 MG tablet Take 1 tablet (2.5 mg total) by mouth daily.  . Aspirin 81 MG EC tablet Take 81 mg by mouth daily.  . citalopram (CELEXA) 20 MG tablet TAKE 1 TABLET (20 MG TOTAL) BY MOUTH DAILY.  Marland Kitchen clopidogrel (PLAVIX) 75 MG tablet TAKE 1 TABLET (75 MG TOTAL) BY MOUTH DAILY.  . fexofenadine (ALLEGRA) 180 MG tablet Take 180 mg by mouth daily. As needed  . levothyroxine (SYNTHROID) 112 MCG tablet Take 1 tablet (112 mcg total) by mouth daily before breakfast.  . lisinopril (PRINIVIL,ZESTRIL) 40 MG tablet TAKE 1 TABLET (40 MG TOTAL) BY MOUTH DAILY.  Marland Kitchen LORazepam (ATIVAN) 1 MG tablet TAKE 1 TABLET BY MOUTH AT BEDTIME AS NEEDED FOR ANXIETY  . metoprolol tartrate (LOPRESSOR) 25 MG tablet Take 1 tablet (25 mg total) by mouth 2 (two) times daily.  . naproxen sodium (ANAPROX) 220 MG tablet Take by mouth.  . nitroGLYCERIN (NITROSTAT) 0.4 MG SL tablet Place 0.4 mg under the tongue every 5 (five) minutes as needed. If a third tablet is needed please call 911.  . pantoprazole (PROTONIX) 40 MG tablet TAKE 1 TABLET (40 MG TOTAL) BY MOUTH DAILY.  . rosuvastatin (CRESTOR) 20 MG tablet TAKE 1 TABLET (20 MG TOTAL) BY MOUTH NIGHTLY.  . triamcinolone cream (KENALOG) 0.1 % Apply 1 application topically 2 (two) times daily.   No facility-administered encounter medications on file as of 09/25/2017.     Review of Systems  Constitutional: Negative for appetite change and unexpected weight change.  HENT: Negative for congestion and sinus pressure.   Respiratory: Negative for cough, chest tightness and shortness of breath.   Cardiovascular: Negative for chest pain, palpitations and leg swelling.  Gastrointestinal: Negative for abdominal pain, diarrhea, nausea and vomiting.  Genitourinary: Negative for difficulty urinating and dysuria.  Musculoskeletal: Negative for joint swelling and myalgias.  Skin: Negative for color change and rash.  Neurological: Negative for dizziness, light-headedness  and headaches.  Psychiatric/Behavioral: Negative for agitation and dysphoric mood.       Objective:     Blood pressure rechecked by me:  148/84  Physical Exam  Constitutional: He appears well-developed and well-nourished. No distress.  HENT:  Nose: Nose normal.  Mouth/Throat: Oropharynx is clear and moist.  Neck: Neck supple. No thyromegaly present.  Cardiovascular: Normal rate and regular rhythm.  Pulmonary/Chest:  Effort normal and breath sounds normal. No respiratory distress.  Abdominal: Soft. Bowel sounds are normal. There is no tenderness.  Musculoskeletal: He exhibits no edema or tenderness.  Lymphadenopathy:    He has no cervical adenopathy.  Skin: No rash noted. No erythema.  Psychiatric: He has a normal mood and affect. His behavior is normal.    BP 138/80 (BP Location: Right Arm, Patient Position: Sitting, Cuff Size: Large)   Pulse 70   Temp 97.9 F (36.6 C) (Oral)   Resp 16   Wt 208 lb 6.4 oz (94.5 kg)   SpO2 100%   BMI 31.69 kg/m  Wt Readings from Last 3 Encounters:  09/25/17 208 lb 6.4 oz (94.5 kg)  09/12/17 206 lb (93.4 kg)  05/03/17 213 lb (96.6 kg)     Lab Results  Component Value Date   WBC 4.5 09/12/2017   HGB 13.4 09/12/2017   HCT 40.5 09/12/2017   PLT 228.0 09/12/2017   GLUCOSE 95 09/12/2017   CHOL 108 09/12/2017   TRIG 97.0 09/12/2017   HDL 45.80 09/12/2017   LDLCALC 43 09/12/2017   ALT 7 09/12/2017   AST 15 09/12/2017   NA 138 09/12/2017   K 4.3 09/12/2017   CL 101 09/12/2017   CREATININE 0.92 09/12/2017   BUN 10 09/12/2017   CO2 31 09/12/2017   TSH 8.06 (H) 09/12/2017   PSA 0.33 08/26/2015   INR 1.13 06/11/2016   HGBA1C 5.9 09/12/2017   MICROALBUR 1.6 05/25/2014       Assessment & Plan:   Problem List Items Addressed This Visit    Anemia    Has had EGD and colonoscopy 2012.  Follow cbc.        Carotid artery stenosis    Carotid duplex 11/09/16 with ICA (left) 1-39 and right patent.  Recommended f/u in one year.          Relevant Medications   amLODipine (NORVASC) 2.5 MG tablet   Coronary atherosclerosis of native coronary artery    Remains on plavix and aspirin.  Followed by cardiology.  Stable.  Continue risk factor modification.        Relevant Medications   amLODipine (NORVASC) 2.5 MG tablet   Essential hypertension, benign    Blood pressure remains slightly elevated.  Reviewed his outside checks.  Add amlodipine 2.22m q day.  Follow pressures.  Get him back in soon to reassess.  Follow pressures.  Follow metabolic panel.       Relevant Medications   amLODipine (NORVASC) 2.5 MG tablet   H/O total knee replacement    Doing well s/p surgery.  Follow.        Hyperglycemia    Low carb diet and exercise.  Follow met b and a1c.       Hypothyroidism    Back on thyroid medication now.  Last tsh slightly elevated.  Recheck tsh with next labs.       Pure hypercholesterolemia    On crestor.  Low cholesterol diet and exercise.  Follow lipid panel and liver function tests.        Relevant Medications   amLODipine (NORVASC) 2.5 MG tablet    Other Visit Diagnoses    Encounter for immunization       Relevant Orders   Flu vaccine HIGH DOSE PF (Completed)       SEinar Pheasant MD

## 2017-09-28 ENCOUNTER — Encounter: Payer: Self-pay | Admitting: Internal Medicine

## 2017-09-28 NOTE — Assessment & Plan Note (Signed)
Low carb diet and exercise.  Follow met b and a1c.  

## 2017-09-28 NOTE — Assessment & Plan Note (Signed)
Remains on plavix and aspirin.  Followed by cardiology.  Stable.  Continue risk factor modification.

## 2017-09-28 NOTE — Assessment & Plan Note (Signed)
Carotid duplex 11/09/16 with ICA (left) 1-39 and right patent.  Recommended f/u in one year.

## 2017-09-28 NOTE — Assessment & Plan Note (Signed)
Back on thyroid medication now.  Last tsh slightly elevated.  Recheck tsh with next labs.

## 2017-09-28 NOTE — Assessment & Plan Note (Signed)
Blood pressure remains slightly elevated.  Reviewed his outside checks.  Add amlodipine 2.5mg  q day.  Follow pressures.  Get him back in soon to reassess.  Follow pressures.  Follow metabolic panel.

## 2017-09-28 NOTE — Assessment & Plan Note (Signed)
Doing well s/p surgery.  Follow.  

## 2017-09-28 NOTE — Assessment & Plan Note (Signed)
Has had EGD and colonoscopy 2012.  Follow cbc.

## 2017-09-28 NOTE — Assessment & Plan Note (Signed)
On crestor.  Low cholesterol diet and exercise.  Follow lipid panel and liver function tests.   

## 2017-10-01 DIAGNOSIS — I420 Dilated cardiomyopathy: Secondary | ICD-10-CM | POA: Diagnosis not present

## 2017-11-01 ENCOUNTER — Ambulatory Visit: Payer: Medicare Other | Admitting: Internal Medicine

## 2017-11-03 ENCOUNTER — Other Ambulatory Visit: Payer: Self-pay | Admitting: Internal Medicine

## 2017-11-06 ENCOUNTER — Other Ambulatory Visit: Payer: Self-pay | Admitting: Internal Medicine

## 2017-11-08 NOTE — Telephone Encounter (Signed)
Last OV 09/25/2017  Next OV 12/05/2017  Last refill 09/09/2017

## 2017-11-12 ENCOUNTER — Ambulatory Visit (INDEPENDENT_AMBULATORY_CARE_PROVIDER_SITE_OTHER): Payer: Medicare Other | Admitting: Vascular Surgery

## 2017-11-12 ENCOUNTER — Encounter (INDEPENDENT_AMBULATORY_CARE_PROVIDER_SITE_OTHER): Payer: Medicare Other

## 2017-11-14 ENCOUNTER — Other Ambulatory Visit: Payer: Self-pay | Admitting: Internal Medicine

## 2017-12-05 ENCOUNTER — Encounter: Payer: Self-pay | Admitting: Internal Medicine

## 2017-12-05 ENCOUNTER — Ambulatory Visit (INDEPENDENT_AMBULATORY_CARE_PROVIDER_SITE_OTHER): Payer: Medicare Other | Admitting: Internal Medicine

## 2017-12-05 VITALS — BP 128/82 | HR 75 | Temp 97.5°F | Resp 18 | Wt 208.6 lb

## 2017-12-05 DIAGNOSIS — I10 Essential (primary) hypertension: Secondary | ICD-10-CM | POA: Diagnosis not present

## 2017-12-05 DIAGNOSIS — E079 Disorder of thyroid, unspecified: Secondary | ICD-10-CM

## 2017-12-05 DIAGNOSIS — R739 Hyperglycemia, unspecified: Secondary | ICD-10-CM

## 2017-12-05 DIAGNOSIS — I6523 Occlusion and stenosis of bilateral carotid arteries: Secondary | ICD-10-CM | POA: Diagnosis not present

## 2017-12-05 DIAGNOSIS — E039 Hypothyroidism, unspecified: Secondary | ICD-10-CM | POA: Diagnosis not present

## 2017-12-05 DIAGNOSIS — E785 Hyperlipidemia, unspecified: Secondary | ICD-10-CM | POA: Diagnosis not present

## 2017-12-05 DIAGNOSIS — D649 Anemia, unspecified: Secondary | ICD-10-CM

## 2017-12-05 LAB — BASIC METABOLIC PANEL
BUN: 8 mg/dL (ref 6–23)
CHLORIDE: 100 meq/L (ref 96–112)
CO2: 31 meq/L (ref 19–32)
Calcium: 9.8 mg/dL (ref 8.4–10.5)
Creatinine, Ser: 0.98 mg/dL (ref 0.40–1.50)
GFR: 78.71 mL/min (ref >60.00)
GLUCOSE: 95 mg/dL (ref 70–99)
POTASSIUM: 4.5 meq/L (ref 3.5–5.1)
SODIUM: 135 meq/L (ref 135–145)

## 2017-12-05 LAB — LIPID PANEL
Cholesterol: 124 mg/dL (ref 0–200)
HDL: 45.4 mg/dL (ref >39.00)
LDL Cholesterol: 56 mg/dL (ref 0–99)
NONHDL: 78.28
TRIGLYCERIDES: 111 mg/dL (ref 0.0–149.0)
Total CHOL/HDL Ratio: 3
VLDL: 22.2 mg/dL (ref 0.0–40.0)

## 2017-12-05 LAB — HEMOGLOBIN A1C: Hgb A1c MFr Bld: 5.9 % (ref 4.6–6.5)

## 2017-12-05 LAB — HEPATIC FUNCTION PANEL
ALBUMIN: 4.3 g/dL (ref 3.5–5.2)
ALK PHOS: 85 U/L (ref 39–117)
ALT: 10 U/L (ref 0–53)
AST: 13 U/L (ref 0–37)
BILIRUBIN DIRECT: 0.2 mg/dL (ref 0.0–0.3)
TOTAL PROTEIN: 7 g/dL (ref 6.0–8.3)
Total Bilirubin: 0.8 mg/dL (ref 0.2–1.2)

## 2017-12-05 LAB — TSH: TSH: 5.73 u[IU]/mL — ABNORMAL HIGH (ref 0.35–4.50)

## 2017-12-05 MED ORDER — CLOPIDOGREL BISULFATE 75 MG PO TABS: 75.0000 mg | ORAL_TABLET | Freq: Every day | ORAL | 5 refills | Status: DC

## 2017-12-05 MED ORDER — LEVOTHYROXINE SODIUM 112 MCG PO TABS: 112.0000 ug | ORAL_TABLET | Freq: Every day | ORAL | 5 refills | Status: DC

## 2017-12-05 MED ORDER — LISINOPRIL 40 MG PO TABS: ORAL_TABLET | ORAL | 5 refills | Status: DC

## 2017-12-05 MED ORDER — CITALOPRAM HYDROBROMIDE 20 MG PO TABS: ORAL_TABLET | ORAL | 5 refills | Status: DC

## 2017-12-05 MED ORDER — LORAZEPAM 1 MG PO TABS: ORAL_TABLET | ORAL | 1 refills | Status: DC

## 2017-12-05 MED ORDER — AMLODIPINE BESYLATE 2.5 MG PO TABS: 2.5000 mg | ORAL_TABLET | Freq: Every day | ORAL | 5 refills | Status: DC

## 2017-12-05 NOTE — Progress Notes (Signed)
Patient ID: Daniel Austin, male   DOB: [birth year], 77 y.o.   MRN: [id number]   Subjective:    Patient ID: Daniel Austin, male    DOB: 02-13-1941, 77 y.o.   MRN: [id number]  HPI  Patient here for a scheduled follow up.  Was having problems with elevated blood pressure.  Was started on amlodipine 2.51m q day last visit.  Reviewed outside blood pressure checks.  Blood pressure trending down.  He feels good.  Staying active.  Some previous allergy symptoms.  Resolved. No chest pain.  No sob. No acid reflux.  No abdominal pain.  Bowels moving.  Knees doing well.  Planning to reschedule f/u with [facility].  Follows with cardiology.     Past Medical History:  Diagnosis Date  . Anemia   . Anxiety   . Arthritis   . CAD (coronary artery disease)   [place]   . Carotid artery stenosis    high-grade symptomatic right sided s/p stent twice  . CHF (congestive heart failure) (HCC)    chronic movement fluid and history of CHF he noticed in the   . Depression   . Fractures, multiple    Metal rod in left foot  . GERD (gastroesophageal reflux disease)   . Heart block   . Hypercholesterolemia   . Hyperglycemia   . Hypertension   . Hypothyroidism   . Knee pain   . Presence of permanent cardiac pacemaker   . Shortness of breath dyspnea   . Stroke ([facility]    clot in neck broke off left side weakness 2008  resolved  . Syncope    second degree heart block s/p St Jude's pacemaker placement 12/08 with his history is limited with any  . Ulcer of gastric fundus    Past Surgical History:  Procedure Laterality Date  . CARDIAC CATHETERIZATION    . CORONARY ANGIOPLASTY     stents x 3  . heart stint  11/04/2014   [facility]  . HERNIA REPAIR     inguinal  . INSERT / REPLACE / REMOVE PACEMAKER    . JOINT REPLACEMENT     right knee  . KNEE ARTHROPLASTY Right 02/08/2016   Procedure: COMPUTER ASSISTED TOTAL KNEE ARTHROPLASTY;  Surgeon: JDereck Leep MD;  Location: [facility] ORS;  Service:  Orthopedics;  Laterality: Right;  . KNEE ARTHROPLASTY Left 06/27/2016   Procedure: COMPUTER ASSISTED TOTAL KNEE ARTHROPLASTY;  Surgeon: JDereck Leep MD;  Location: [facility] ORS;  Service: Orthopedics;  Laterality: Left;  . PACEMAKER INSERTION  08/2007  . PACEMAKER INSERTION N/A 11/16/2015   Procedure: INSERTION PACEMAKER/ PACEMAKER CHANGE OUT;  Surgeon: AIsaias Cowman MD;  Location: [facility] ORS;  Service: Cardiovascular;  Laterality: N/A;  . PERCUTANEOUS PLACEMENT INTRAVASCULAR STENT CERVICAL CAROTID ARTERY     twice, right carotid artery   Family History  Problem Relation Age of Onset  . Heart disease Mother        CHF  . Heart disease Father        CHF  . Thyroid disease Father        hypothyroidism  . Colon polyps Father   . Diabetes Sister        diet controlled  . Thyroid disease Sister   . Cancer Sister        breast   . Thyroid disease Sister   . Cancer Sister        breast  . Colon cancer Neg Hx   . Prostate cancer  Neg Hx    Social History   Socioeconomic History  . Marital status: Married    Spouse name: None  . Number of children: 1  . Years of education: None  . Highest education level: None  Social Needs  . Financial resource strain: Not hard at all  . Food insecurity - worry: Never true  . Food insecurity - inability: Never true  . Transportation needs - medical: No  . Transportation needs - non-medical: No  Occupational History  . None  Tobacco Use  . Smoking status: Never Smoker  . Smokeless tobacco: Never Used  Substance and Sexual Activity  . Alcohol use: No    Alcohol/week: 0.0 oz  . Drug use: No  . Sexual activity: Not Currently  Other Topics Concern  . None  Social History Narrative  . None    Outpatient Encounter Medications as of 12/05/2017  Medication Sig  . acetaminophen (TYLENOL) 500 MG tablet Take 1,000 mg by mouth every 6 (six) hours as needed.  Marland Kitchen amLODipine (NORVASC) 2.5 MG tablet Take 1 tablet (2.5 mg total) by mouth daily.  .  Aspirin 81 MG EC tablet Take 81 mg by mouth daily.  . citalopram (CELEXA) 20 MG tablet TAKE 1 TABLET (20 MG TOTAL) BY MOUTH DAILY.  Marland Kitchen clopidogrel (PLAVIX) 75 MG tablet Take 1 tablet (75 mg total) by mouth daily.  Marland Kitchen levothyroxine (SYNTHROID) 112 MCG tablet Take 1 tablet (112 mcg total) by mouth daily before breakfast.  . lisinopril (PRINIVIL,ZESTRIL) 40 MG tablet TAKE 1 TABLET (40 MG TOTAL) BY MOUTH DAILY.  Marland Kitchen LORazepam (ATIVAN) 1 MG tablet TAKE 1 TABLET BY MOUTH AT BEDTIME AS NEEDED ANXIETY  . metoprolol tartrate (LOPRESSOR) 25 MG tablet TAKE 1 TABLET (25 MG TOTAL) BY MOUTH 2 (TWO) TIMES DAILY.  . pantoprazole (PROTONIX) 40 MG tablet TAKE 1 TABLET (40 MG TOTAL) BY MOUTH DAILY.  . rosuvastatin (CRESTOR) 20 MG tablet TAKE 1 TABLET (20 MG TOTAL) BY MOUTH NIGHTLY.  . [DISCONTINUED] amLODipine (NORVASC) 2.5 MG tablet Take 1 tablet (2.5 mg total) by mouth daily.  . [DISCONTINUED] citalopram (CELEXA) 20 MG tablet TAKE 1 TABLET (20 MG TOTAL) BY MOUTH DAILY.  . [DISCONTINUED] clopidogrel (PLAVIX) 75 MG tablet TAKE 1 TABLET (75 MG TOTAL) BY MOUTH DAILY.  . [DISCONTINUED] levothyroxine (SYNTHROID) 112 MCG tablet Take 1 tablet (112 mcg total) by mouth daily before breakfast.  . [DISCONTINUED] lisinopril (PRINIVIL,ZESTRIL) 40 MG tablet TAKE 1 TABLET (40 MG TOTAL) BY MOUTH DAILY.  . [DISCONTINUED] LORazepam (ATIVAN) 1 MG tablet TAKE 1 TABLET BY MOUTH AT BEDTIME AS NEEDED ANXIETY  . fexofenadine (ALLEGRA) 180 MG tablet Take 180 mg by mouth daily. As needed  . naproxen sodium (ANAPROX) 220 MG tablet Take by mouth.  . nitroGLYCERIN (NITROSTAT) 0.4 MG SL tablet Place 0.4 mg under the tongue every 5 (five) minutes as needed. If a third tablet is needed please call 911.  . triamcinolone cream (KENALOG) 0.1 % Apply 1 application topically 2 (two) times daily. (Patient not taking: Reported on 12/05/2017)   No facility-administered encounter medications on file as of 12/05/2017.     Review of Systems  Constitutional:  Negative for appetite change and unexpected weight change.  HENT: Negative for congestion and sinus pressure.   Respiratory: Negative for cough, chest tightness and shortness of breath.   Cardiovascular: Negative for chest pain, palpitations and leg swelling.  Gastrointestinal: Negative for abdominal pain, diarrhea, nausea and vomiting.  Genitourinary: Negative for difficulty urinating and dysuria.  Musculoskeletal:  Negative for joint swelling and myalgias.       Knees doing well s/p surgery.   Skin: Negative for color change and rash.  Neurological: Negative for dizziness, light-headedness and headaches.  Psychiatric/Behavioral: Negative for agitation and dysphoric mood.       Objective:    Physical Exam  Constitutional: He appears well-developed and well-nourished. No distress.  HENT:  Nose: Nose normal.  Mouth/Throat: Oropharynx is clear and moist.  Neck: Neck supple. No thyromegaly present.  Cardiovascular: Normal rate and regular rhythm.  Pulmonary/Chest: Effort normal and breath sounds normal. No respiratory distress.  Abdominal: Soft. Bowel sounds are normal. There is no tenderness.  Musculoskeletal: He exhibits no edema or tenderness.  Lymphadenopathy:    He has no cervical adenopathy.  Skin: No rash noted. No erythema.  Psychiatric: He has a normal mood and affect. His behavior is normal.    BP 128/82 (BP Location: Left Arm, Patient Position: Sitting, Cuff Size: Normal)   Pulse 75   Temp (!) 97.5 F (36.4 C) (Oral)   Resp 18   Wt 208 lb 9.6 oz (94.6 kg)   SpO2 98%   BMI 31.72 kg/m  Wt Readings from Last 3 Encounters:  12/05/17 208 lb 9.6 oz (94.6 kg)  09/25/17 208 lb 6.4 oz (94.5 kg)  09/12/17 206 lb (93.4 kg)     Lab Results  Component Value Date   WBC 4.5 09/12/2017   HGB 13.4 09/12/2017   HCT 40.5 09/12/2017   PLT 228.0 09/12/2017   GLUCOSE 95 12/05/2017   CHOL 124 12/05/2017   TRIG 111.0 12/05/2017   HDL 45.40 12/05/2017   LDLCALC 56 12/05/2017     ALT 10 12/05/2017   AST 13 12/05/2017   NA 135 12/05/2017   K 4.5 12/05/2017   CL 100 12/05/2017   CREATININE 0.98 12/05/2017   BUN 8 12/05/2017   CO2 31 12/05/2017   TSH 5.73 (H) 12/05/2017   PSA 0.33 08/26/2015   INR 1.13 06/11/2016   HGBA1C 5.9 12/05/2017   MICROALBUR 1.6 05/25/2014       Assessment & Plan:   Problem List Items Addressed This Visit    Adult hypothyroidism    On thyroid replacement.  Follow tsh.        Relevant Medications   levothyroxine (SYNTHROID) 112 MCG tablet   Anemia    Follow cbc.       Benign essential HTN - Primary   Relevant Medications   amLODipine (NORVASC) 2.5 MG tablet   lisinopril (PRINIVIL,ZESTRIL) 40 MG tablet   Carotid artery stenosis    Carotid duplex 11/09/16 with ICA (left) 1-39 and right patent.  Recommended f/u in one year.  He plans to call and reschedule.        Relevant Medications   amLODipine (NORVASC) 2.5 MG tablet   lisinopril (PRINIVIL,ZESTRIL) 40 MG tablet   Disease of thyroid gland   Relevant Medications   levothyroxine (SYNTHROID) 112 MCG tablet   Essential hypertension, benign    Blood pressure under good control.  Continue same medication regimen.  Follow pressures.  Follow metabolic panel.        Relevant Medications   amLODipine (NORVASC) 2.5 MG tablet   lisinopril (PRINIVIL,ZESTRIL) 40 MG tablet   Other Relevant Orders   Basic metabolic panel (Completed)   Basic metabolic panel   HLD (hyperlipidemia)    Low cholesterol diet and exercise.  Follow lipid panel.  On crestor.        Relevant Medications  amLODipine (NORVASC) 2.5 MG tablet   lisinopril (PRINIVIL,ZESTRIL) 40 MG tablet   Other Relevant Orders   Hepatic function panel (Completed)   Lipid panel (Completed)   Hepatic function panel   Lipid panel   Hyperglycemia    Low carb diet and exercise.  Follow met b and a1c.        Relevant Orders   Hemoglobin A1c (Completed)   Hemoglobin A1c   Hypothyroidism   Relevant Medications    levothyroxine (SYNTHROID) 112 MCG tablet   Other Relevant Orders   TSH (Completed)       Einar Pheasant, MD

## 2017-12-07 ENCOUNTER — Encounter: Payer: Self-pay | Admitting: Internal Medicine

## 2017-12-07 NOTE — Assessment & Plan Note (Signed)
Low carb diet and exercise.  Follow met b and a1c.   

## 2017-12-07 NOTE — Assessment & Plan Note (Signed)
Carotid duplex 11/09/16 with ICA (left) 1-39 and right patent.  Recommended f/u in one year.  He plans to call and reschedule.

## 2017-12-07 NOTE — Assessment & Plan Note (Signed)
Low cholesterol diet and exercise.  Follow lipid panel.  On crestor.   

## 2017-12-07 NOTE — Assessment & Plan Note (Signed)
On thyroid replacement.  Follow tsh.  

## 2017-12-07 NOTE — Assessment & Plan Note (Signed)
Follow cbc.  

## 2017-12-07 NOTE — Assessment & Plan Note (Signed)
Blood pressure under good control.  Continue same medication regimen.  Follow pressures.  Follow metabolic panel.   

## 2017-12-29 IMAGING — DX DG KNEE 1-2V PORT*R*
2 series · 2 of 2 positions shown · non-contrast
Comparison: None.

CLINICAL DATA: Status post right knee replacement

EXAM:
PORTABLE RIGHT KNEE - 1-2 VIEW

[knee ap]
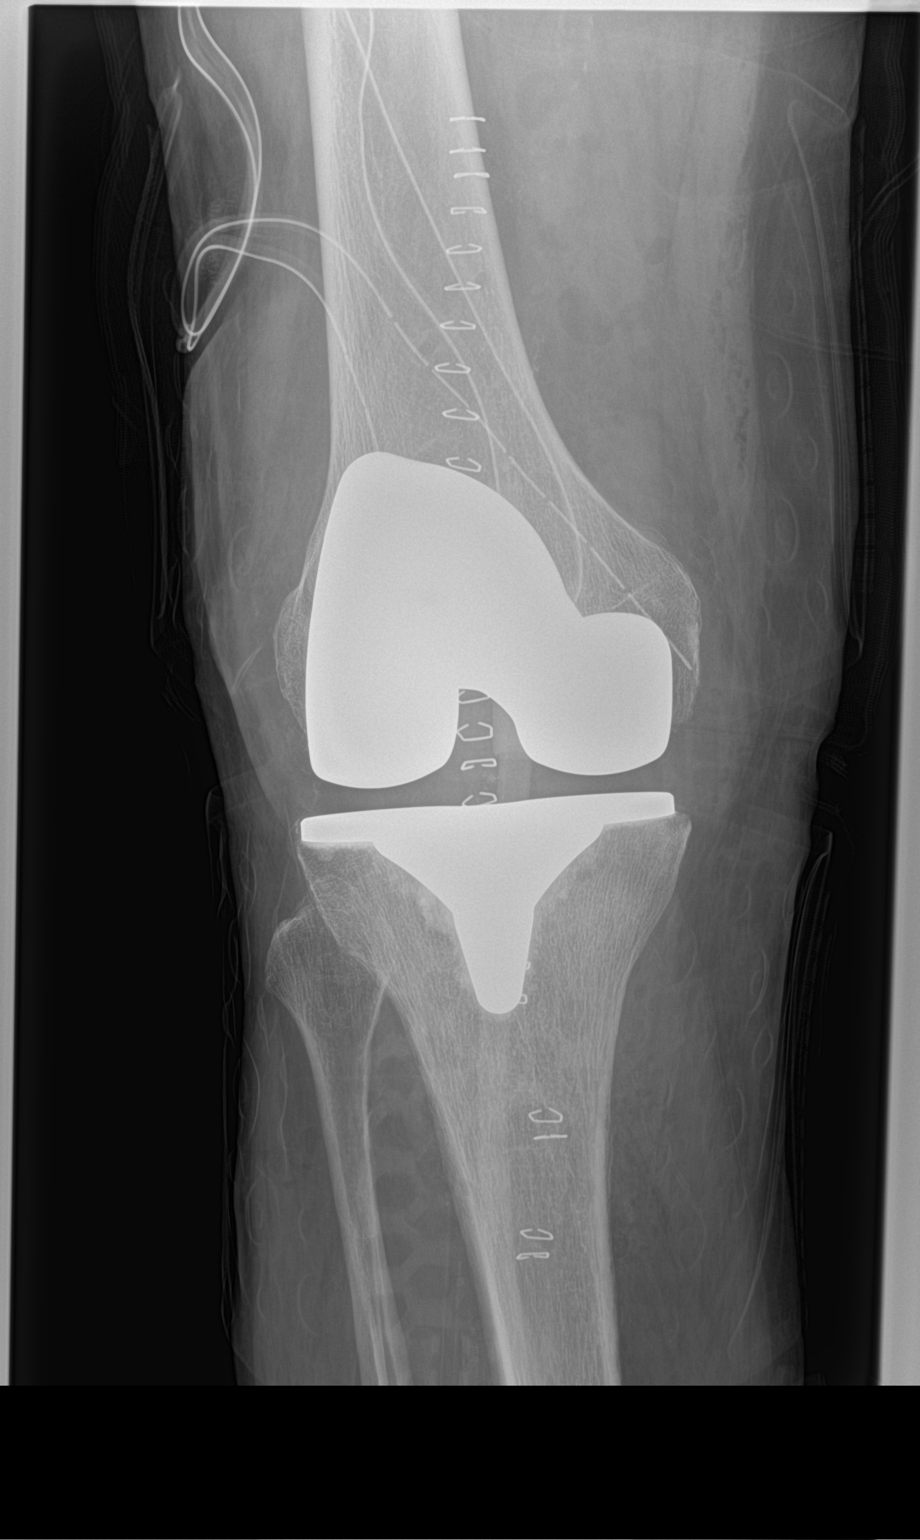

[knee lat]
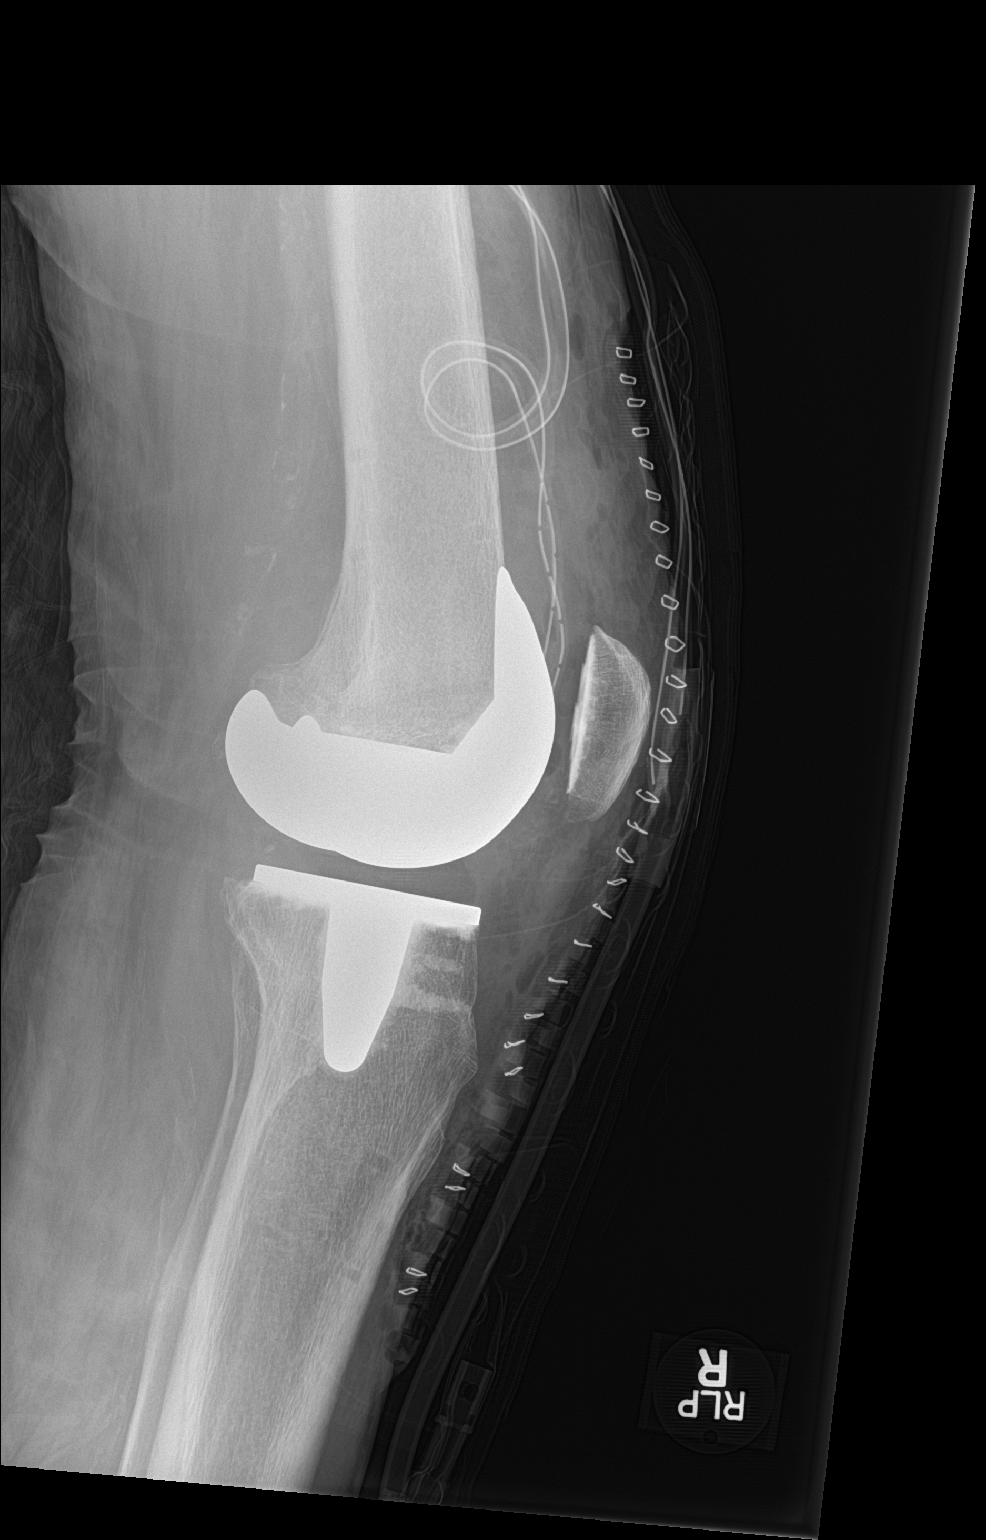

[2 of 2 positions shown; findings below may reference images not displayed]

FINDINGS: Right knee prosthesis is seen. No acute bony abnormality is noted.
Surgical drains are noted in place.
IMPRESSION: No acute abnormality noted.

## 2018-01-21 ENCOUNTER — Ambulatory Visit (INDEPENDENT_AMBULATORY_CARE_PROVIDER_SITE_OTHER): Payer: Medicare Other

## 2018-01-21 ENCOUNTER — Ambulatory Visit (INDEPENDENT_AMBULATORY_CARE_PROVIDER_SITE_OTHER): Payer: Medicare Other | Admitting: Vascular Surgery

## 2018-01-21 DIAGNOSIS — I6523 Occlusion and stenosis of bilateral carotid arteries: Secondary | ICD-10-CM | POA: Diagnosis not present

## 2018-01-23 ENCOUNTER — Ambulatory Visit (INDEPENDENT_AMBULATORY_CARE_PROVIDER_SITE_OTHER): Payer: Medicare Other | Admitting: Podiatry

## 2018-01-23 ENCOUNTER — Encounter: Payer: Self-pay | Admitting: Podiatry

## 2018-01-23 DIAGNOSIS — B351 Tinea unguium: Secondary | ICD-10-CM

## 2018-01-23 DIAGNOSIS — M21619 Bunion of unspecified foot: Secondary | ICD-10-CM

## 2018-01-23 DIAGNOSIS — D689 Coagulation defect, unspecified: Secondary | ICD-10-CM

## 2018-01-23 NOTE — Progress Notes (Signed)
Complaint:  Visit Type: Patient returns to my office for continued preventative foot care services. Complaint: Patient states" my nails have grown long and thick and become painful to walk and wear shoes"  The patient presents for preventative foot care services. No changes to ROS.  Patient is taking plavix.  Podiatric Exam: Vascular: dorsalis pedis and posterior tibial pulses are palpable bilateral. Capillary return is immediate. Temperature gradient is WNL. Skin turgor WNL  Sensorium: Diminished  Semmes Weinstein monofilament test. Normal tactile sensation bilaterally. Nail Exam: Pt has thick disfigured discolored nails with subungual debris noted bilateral entire nail hallux through fifth toenails Ulcer Exam: There is no evidence of ulcer or pre-ulcerative changes or infection. Orthopedic Exam: Muscle tone and strength are WNL. No limitations in general ROM. No crepitus or effusions noted. Foot type and digits show no abnormalities. HAV  B/L with hammer toes.. Skin: No Porokeratosis. No infection or ulcers  Diagnosis:  Onychomycosis, , Pain in right toe, pain in left toes  Treatment & Plan Procedures and Treatment: Consent by patient was obtained for treatment procedures. The patient understood the discussion of treatment and procedures well. All questions were answered thoroughly reviewed. Debridement of mycotic and hypertrophic toenails, 1 through 5 bilateral and clearing of subungual debris. No ulceration, no infection noted.  Return Visit-Office Procedure: Patient instructed to return to the office for a follow up visit 3 months for continued evaluation and treatment.    Vasili Fok DPM 

## 2018-01-27 ENCOUNTER — Encounter (INDEPENDENT_AMBULATORY_CARE_PROVIDER_SITE_OTHER): Payer: Self-pay | Admitting: Vascular Surgery

## 2018-02-02 ENCOUNTER — Other Ambulatory Visit: Payer: Self-pay | Admitting: Internal Medicine

## 2018-03-03 ENCOUNTER — Other Ambulatory Visit: Payer: Self-pay | Admitting: Internal Medicine

## 2018-03-04 NOTE — Telephone Encounter (Signed)
Last OV 12/05/17 Next OV 04/18/18 Last refill 12/05/17

## 2018-03-05 NOTE — Telephone Encounter (Signed)
faxed

## 2018-03-11 DIAGNOSIS — I6521 Occlusion and stenosis of right carotid artery: Secondary | ICD-10-CM | POA: Diagnosis not present

## 2018-03-11 DIAGNOSIS — Z9889 Other specified postprocedural states: Secondary | ICD-10-CM | POA: Diagnosis not present

## 2018-03-11 DIAGNOSIS — I459 Conduction disorder, unspecified: Secondary | ICD-10-CM | POA: Diagnosis not present

## 2018-03-11 DIAGNOSIS — E785 Hyperlipidemia, unspecified: Secondary | ICD-10-CM | POA: Diagnosis not present

## 2018-03-11 DIAGNOSIS — I10 Essential (primary) hypertension: Secondary | ICD-10-CM | POA: Diagnosis not present

## 2018-03-11 DIAGNOSIS — E7800 Pure hypercholesterolemia, unspecified: Secondary | ICD-10-CM | POA: Diagnosis not present

## 2018-03-11 DIAGNOSIS — I2510 Atherosclerotic heart disease of native coronary artery without angina pectoris: Secondary | ICD-10-CM | POA: Diagnosis not present

## 2018-03-11 DIAGNOSIS — I5022 Chronic systolic (congestive) heart failure: Secondary | ICD-10-CM | POA: Diagnosis not present

## 2018-03-11 DIAGNOSIS — I6529 Occlusion and stenosis of unspecified carotid artery: Secondary | ICD-10-CM | POA: Diagnosis not present

## 2018-03-11 DIAGNOSIS — I472 Ventricular tachycardia: Secondary | ICD-10-CM | POA: Diagnosis not present

## 2018-03-11 DIAGNOSIS — I255 Ischemic cardiomyopathy: Secondary | ICD-10-CM | POA: Diagnosis not present

## 2018-03-13 DIAGNOSIS — Z96653 Presence of artificial knee joint, bilateral: Secondary | ICD-10-CM | POA: Diagnosis not present

## 2018-04-01 ENCOUNTER — Other Ambulatory Visit: Payer: Self-pay | Admitting: Internal Medicine

## 2018-04-04 ENCOUNTER — Other Ambulatory Visit: Payer: Self-pay | Admitting: Internal Medicine

## 2018-04-12 ENCOUNTER — Other Ambulatory Visit: Payer: Self-pay | Admitting: Internal Medicine

## 2018-04-15 DIAGNOSIS — I420 Dilated cardiomyopathy: Secondary | ICD-10-CM | POA: Diagnosis not present

## 2018-04-16 ENCOUNTER — Other Ambulatory Visit (INDEPENDENT_AMBULATORY_CARE_PROVIDER_SITE_OTHER): Payer: Medicare Other

## 2018-04-16 DIAGNOSIS — E785 Hyperlipidemia, unspecified: Secondary | ICD-10-CM

## 2018-04-16 DIAGNOSIS — R739 Hyperglycemia, unspecified: Secondary | ICD-10-CM

## 2018-04-16 DIAGNOSIS — I10 Essential (primary) hypertension: Secondary | ICD-10-CM

## 2018-04-16 LAB — HEPATIC FUNCTION PANEL
ALK PHOS: 86 U/L (ref 39–117)
ALT: 9 U/L (ref 0–53)
AST: 14 U/L (ref 0–37)
Albumin: 4.4 g/dL (ref 3.5–5.2)
BILIRUBIN DIRECT: 0.1 mg/dL (ref 0.0–0.3)
BILIRUBIN TOTAL: 0.7 mg/dL (ref 0.2–1.2)
Total Protein: 7.4 g/dL (ref 6.0–8.3)

## 2018-04-16 LAB — BASIC METABOLIC PANEL
BUN: 11 mg/dL (ref 6–23)
CALCIUM: 9.7 mg/dL (ref 8.4–10.5)
CHLORIDE: 99 meq/L (ref 96–112)
CO2: 30 meq/L (ref 19–32)
CREATININE: 0.97 mg/dL (ref 0.40–1.50)
GFR: 79.58 mL/min (ref >60.00)
Glucose, Bld: 102 mg/dL — ABNORMAL HIGH (ref 70–99)
Potassium: 5.1 mEq/L (ref 3.5–5.1)
SODIUM: 135 meq/L (ref 135–145)

## 2018-04-16 LAB — LIPID PANEL
CHOL/HDL RATIO: 3
CHOLESTEROL: 134 mg/dL (ref 0–200)
HDL: 47 mg/dL (ref >39.00)
LDL CALC: 65 mg/dL (ref 0–99)
NonHDL: 86.85
TRIGLYCERIDES: 108 mg/dL (ref 0.0–149.0)
VLDL: 21.6 mg/dL (ref 0.0–40.0)

## 2018-04-16 LAB — HEMOGLOBIN A1C: Hgb A1c MFr Bld: 6 % (ref 4.6–6.5)

## 2018-04-18 ENCOUNTER — Ambulatory Visit (INDEPENDENT_AMBULATORY_CARE_PROVIDER_SITE_OTHER): Payer: Medicare Other | Admitting: Internal Medicine

## 2018-04-18 ENCOUNTER — Encounter: Payer: Self-pay | Admitting: Internal Medicine

## 2018-04-18 VITALS — BP 122/78 | HR 79 | Temp 97.5°F | Resp 18 | Ht 68.0 in | Wt 212.4 lb

## 2018-04-18 DIAGNOSIS — E785 Hyperlipidemia, unspecified: Secondary | ICD-10-CM | POA: Diagnosis not present

## 2018-04-18 DIAGNOSIS — E039 Hypothyroidism, unspecified: Secondary | ICD-10-CM

## 2018-04-18 DIAGNOSIS — I6523 Occlusion and stenosis of bilateral carotid arteries: Secondary | ICD-10-CM | POA: Diagnosis not present

## 2018-04-18 DIAGNOSIS — D649 Anemia, unspecified: Secondary | ICD-10-CM

## 2018-04-18 DIAGNOSIS — Z0000 Encounter for general adult medical examination without abnormal findings: Secondary | ICD-10-CM

## 2018-04-18 DIAGNOSIS — Z125 Encounter for screening for malignant neoplasm of prostate: Secondary | ICD-10-CM | POA: Diagnosis not present

## 2018-04-18 DIAGNOSIS — I2510 Atherosclerotic heart disease of native coronary artery without angina pectoris: Secondary | ICD-10-CM

## 2018-04-18 DIAGNOSIS — E875 Hyperkalemia: Secondary | ICD-10-CM | POA: Diagnosis not present

## 2018-04-18 DIAGNOSIS — I10 Essential (primary) hypertension: Secondary | ICD-10-CM | POA: Diagnosis not present

## 2018-04-18 DIAGNOSIS — Z23 Encounter for immunization: Secondary | ICD-10-CM

## 2018-04-18 DIAGNOSIS — Z96651 Presence of right artificial knee joint: Secondary | ICD-10-CM | POA: Diagnosis not present

## 2018-04-18 DIAGNOSIS — R739 Hyperglycemia, unspecified: Secondary | ICD-10-CM

## 2018-04-18 LAB — POTASSIUM: POTASSIUM: 4.8 meq/L (ref 3.5–5.1)

## 2018-04-18 LAB — PSA, MEDICARE: PSA: 0.41 ng/ml (ref 0.10–4.00)

## 2018-04-18 MED ORDER — LEVOTHYROXINE SODIUM 112 MCG PO TABS: 112.0000 ug | ORAL_TABLET | Freq: Every day | ORAL | 1 refills | Status: DC

## 2018-04-18 MED ORDER — LISINOPRIL 40 MG PO TABS: ORAL_TABLET | ORAL | 1 refills | Status: DC

## 2018-04-18 MED ORDER — CLOPIDOGREL BISULFATE 75 MG PO TABS: 75.0000 mg | ORAL_TABLET | Freq: Every day | ORAL | 1 refills | Status: DC

## 2018-04-18 MED ORDER — LORAZEPAM 1 MG PO TABS: ORAL_TABLET | ORAL | 2 refills | Status: DC

## 2018-04-18 MED ORDER — CITALOPRAM HYDROBROMIDE 20 MG PO TABS: ORAL_TABLET | ORAL | 1 refills | Status: DC

## 2018-04-18 NOTE — Assessment & Plan Note (Addendum)
Physical today 04/18/18.  Colonoscopy 2012.  Check psa.

## 2018-04-18 NOTE — Progress Notes (Signed)
Patient ID: Daniel Austin, male   DOB: [birth year], 77 y.o.   MRN: [id number]   Subjective:    Patient ID: Daniel Austin, male    DOB: 10-06-1940, 77 y.o.   MRN: [id number]  HPI  Patient with past history of  Carotid artery disease, hypertension and hypercholesterolemia.  He comes in today to follow up on these issues as well as for a complete physical exam.  He reports he is doing well.  Sees Dr Lucky Cowboy for f/u of his carotids.  Reports some nasal congestion.  Discussed saline nasal spray and steroid nasal spray.  Some hearing loss.  Discussed formal hearing evaluation.  He declines.  No chest pain.  Breathing stable.  No acid reflux.  No abdominal pain.  Bowels moving.  Stays active.  Knees doing well.     Past Medical History:  Diagnosis Date  . Anemia   . Anxiety   . Arthritis   . CAD (coronary artery disease)   . Cardiomyopathy ([place])   . Carotid artery stenosis    high-grade symptomatic right sided s/p stent twice  . CHF (congestive heart failure) (HCC)    chronic movement fluid and history of CHF he noticed in the   . Depression   . Fractures, multiple    Metal rod in left foot  . GERD (gastroesophageal reflux disease)   . Heart block   . Hypercholesterolemia   . Hyperglycemia   . Hypertension   . Hypothyroidism   . Knee pain   . Presence of permanent cardiac pacemaker   . Shortness of breath dyspnea   . Stroke [facility])    clot in neck broke off left side weakness 2008  resolved  . Syncope    second degree heart block s/p St Jude's pacemaker placement 12/08 with his history is limited with any  . Ulcer of gastric fundus    Past Surgical History:  Procedure Laterality Date  . CARDIAC CATHETERIZATION    . CORONARY ANGIOPLASTY     stents x 3  . heart stint  11/04/2014   [facility]  . HERNIA REPAIR     inguinal  . INSERT / REPLACE / REMOVE PACEMAKER    . JOINT REPLACEMENT     right knee  . KNEE ARTHROPLASTY Right 02/08/2016   Procedure: COMPUTER ASSISTED TOTAL KNEE  ARTHROPLASTY;  Surgeon: Dereck Leep, MD;  Location: [facility] ORS;  Service: Orthopedics;  Laterality: Right;  . KNEE ARTHROPLASTY Left 06/27/2016   Procedure: COMPUTER ASSISTED TOTAL KNEE ARTHROPLASTY;  Surgeon: Dereck Leep, MD;  Location: [facility] ORS;  Service: Orthopedics;  Laterality: Left;  . PACEMAKER INSERTION  08/2007  . PACEMAKER INSERTION N/A 11/16/2015   Procedure: INSERTION PACEMAKER/ PACEMAKER CHANGE OUT;  Surgeon: Isaias Cowman, MD;  Location: [facility] ORS;  Service: Cardiovascular;  Laterality: N/A;  . PERCUTANEOUS PLACEMENT INTRAVASCULAR STENT CERVICAL CAROTID ARTERY     twice, right carotid artery   Family History  Problem Relation Age of Onset  . Heart disease Mother        CHF  . Heart disease Father        CHF  . Thyroid disease Father        hypothyroidism  . Colon polyps Father   . Diabetes Sister        diet controlled  . Thyroid disease Sister   . Cancer Sister        breast   . Thyroid disease Sister   . Cancer Sister  breast  . Colon cancer Neg Hx   . Prostate cancer Neg Hx    Social History   Socioeconomic History  . Marital status: Married    Spouse name: Not on file  . Number of children: 1  . Years of education: Not on file  . Highest education level: Not on file  Occupational History  . Not on file  Social Needs  . Financial resource strain: Not hard at all  . Food insecurity:    Worry: Never true    Inability: Never true  . Transportation needs:    Medical: No    Non-medical: No  Tobacco Use  . Smoking status: Never Smoker  . Smokeless tobacco: Never Used  Substance and Sexual Activity  . Alcohol use: No    Alcohol/week: 0.0 oz  . Drug use: No  . Sexual activity: Not Currently  Lifestyle  . Physical activity:    Days per week: 0 days    Minutes per session: Not on file  . Stress: Not at all  Relationships  . Social connections:    Talks on phone: Patient refused    Gets together: Patient refused    Attends religious  service: Patient refused    Active member of club or organization: Patient refused    Attends meetings of clubs or organizations: Patient refused    Relationship status: Patient refused  Other Topics Concern  . Not on file  Social History Narrative  . Not on file    Outpatient Encounter Medications as of 04/18/2018  Medication Sig  . acetaminophen (TYLENOL) 500 MG tablet Take 1,000 mg by mouth every 6 (six) hours as needed.  Marland Kitchen amLODipine (NORVASC) 2.5 MG tablet TAKE 1 TABLET BY MOUTH EVERY DAY  . Aspirin 81 MG EC tablet Take 81 mg by mouth daily.  . citalopram (CELEXA) 20 MG tablet TAKE 1 TABLET (20 MG TOTAL) BY MOUTH DAILY.  Marland Kitchen clopidogrel (PLAVIX) 75 MG tablet Take 1 tablet (75 mg total) by mouth daily.  . fexofenadine (ALLEGRA) 180 MG tablet Take 180 mg by mouth daily. As needed  . levothyroxine (SYNTHROID) 112 MCG tablet Take 1 tablet (112 mcg total) by mouth daily before breakfast.  . lisinopril (PRINIVIL,ZESTRIL) 40 MG tablet TAKE 1 TABLET (40 MG TOTAL) BY MOUTH DAILY.  Marland Kitchen LORazepam (ATIVAN) 1 MG tablet TAKE 1 TABLET BY MOUTH AT BEDTIME AS NEEDED FOR ANXIETY  . metoprolol tartrate (LOPRESSOR) 25 MG tablet TAKE 1 TABLET BY MOUTH TWICE A DAY  . naproxen sodium (ANAPROX) 220 MG tablet Take by mouth.  . nitroGLYCERIN (NITROSTAT) 0.4 MG SL tablet Place 0.4 mg under the tongue every 5 (five) minutes as needed. If a third tablet is needed please call 911.  . pantoprazole (PROTONIX) 40 MG tablet TAKE 1 TABLET (40 MG TOTAL) BY MOUTH DAILY.  . rosuvastatin (CRESTOR) 20 MG tablet TAKE 1 TABLET (20 MG TOTAL) BY MOUTH NIGHTLY.  . triamcinolone cream (KENALOG) 0.1 % Apply 1 application topically 2 (two) times daily.  . [DISCONTINUED] citalopram (CELEXA) 20 MG tablet TAKE 1 TABLET (20 MG TOTAL) BY MOUTH DAILY.  . [DISCONTINUED] clopidogrel (PLAVIX) 75 MG tablet Take 1 tablet (75 mg total) by mouth daily.  . [DISCONTINUED] levothyroxine (SYNTHROID) 112 MCG tablet Take 1 tablet (112 mcg total) by  mouth daily before breakfast.  . [DISCONTINUED] lisinopril (PRINIVIL,ZESTRIL) 40 MG tablet TAKE 1 TABLET (40 MG TOTAL) BY MOUTH DAILY.  . [DISCONTINUED] LORazepam (ATIVAN) 1 MG tablet TAKE 1 TABLET BY MOUTH AT  BEDTIME AS NEEDED FOR ANXIETY   No facility-administered encounter medications on file as of 04/18/2018.     Review of Systems  Constitutional: Negative for appetite change and unexpected weight change.  HENT: Positive for congestion, hearing loss and postnasal drip. Negative for sinus pressure.   Eyes: Negative for pain and visual disturbance.  Respiratory: Negative for cough, chest tightness and shortness of breath.   Cardiovascular: Negative for chest pain, palpitations and leg swelling.  Gastrointestinal: Negative for abdominal pain, diarrhea, nausea and vomiting.  Genitourinary: Negative for difficulty urinating and dysuria.  Musculoskeletal: Negative for joint swelling and myalgias.  Skin: Negative for color change and rash.  Neurological: Negative for dizziness, light-headedness and headaches.  Hematological: Negative for adenopathy. Does not bruise/bleed easily.  Psychiatric/Behavioral: Negative for agitation and dysphoric mood.       Objective:     Blood pressure rechecked by me:  122/78  Physical Exam  Constitutional: He is oriented to person, place, and time. He appears well-developed and well-nourished. No distress.  HENT:  Head: Normocephalic and atraumatic.  Nose: Nose normal.  Mouth/Throat: Oropharynx is clear and moist. No oropharyngeal exudate.  Eyes: Conjunctivae are normal. Right eye exhibits no discharge. Left eye exhibits no discharge.  Neck: Neck supple. No thyromegaly present.  Cardiovascular: Normal rate and regular rhythm.  Pulmonary/Chest: Breath sounds normal. No respiratory distress. He has no wheezes.  Abdominal: Soft. Bowel sounds are normal. There is no tenderness.  Genitourinary:  Genitourinary Comments: Not performed.    Musculoskeletal:  He exhibits no edema or tenderness.  Lymphadenopathy:    He has no cervical adenopathy.  Neurological: He is alert and oriented to person, place, and time.  Skin: No rash noted. No erythema.  Psychiatric: He has a normal mood and affect. His behavior is normal.    BP 122/78 (BP Location: Left Arm, Patient Position: Sitting, Cuff Size: Normal)   Pulse 79   Temp (!) 97.5 F (36.4 C) (Oral)   Resp 18   Ht '5\' 8"'  (1.727 m)   Wt 212 lb 6.4 oz (96.3 kg)   SpO2 97%   BMI 32.30 kg/m  Wt Readings from Last 3 Encounters:  04/18/18 212 lb 6.4 oz (96.3 kg)  12/05/17 208 lb 9.6 oz (94.6 kg)  09/25/17 208 lb 6.4 oz (94.5 kg)     Lab Results  Component Value Date   WBC 4.5 09/12/2017   HGB 13.4 09/12/2017   HCT 40.5 09/12/2017   PLT 228.0 09/12/2017   GLUCOSE 102 (H) 04/16/2018   CHOL 134 04/16/2018   TRIG 108.0 04/16/2018   HDL 47.00 04/16/2018   LDLCALC 65 04/16/2018   ALT 9 04/16/2018   AST 14 04/16/2018   NA 135 04/16/2018   K 4.8 04/18/2018   CL 99 04/16/2018   CREATININE 0.97 04/16/2018   BUN 11 04/16/2018   CO2 30 04/16/2018   TSH 5.73 (H) 12/05/2017   PSA 0.41 04/18/2018   INR 1.13 06/11/2016   HGBA1C 6.0 04/16/2018   MICROALBUR 1.6 05/25/2014       Assessment & Plan:   Problem List Items Addressed This Visit    Adult hypothyroidism    On thyroid replacement.  Follow tsh.       Relevant Medications   levothyroxine (SYNTHROID) 112 MCG tablet   Other Relevant Orders   TSH   Anemia    Follow cbc.      Benign essential HTN    Blood pressure under good control.  Continue same medication  regimen.  Follow pressures.  Follow metabolic panel.        Relevant Medications   lisinopril (PRINIVIL,ZESTRIL) 40 MG tablet   Other Relevant Orders   Basic metabolic panel   Carotid artery stenosis    Followed by Dr Lucky Cowboy.        Relevant Medications   lisinopril (PRINIVIL,ZESTRIL) 40 MG tablet   Coronary atherosclerosis of native coronary artery    Off plavix.   Followed by cardiology.  Continue risk factor modification.  Stable.        Relevant Medications   lisinopril (PRINIVIL,ZESTRIL) 40 MG tablet   Essential hypertension, benign    Blood pressure under good control.  Continue same medication regimen.  Follow pressures.  Follow metabolic panel.        Relevant Medications   lisinopril (PRINIVIL,ZESTRIL) 40 MG tablet   H/O total knee replacement    Doing well s/p surgery.        Health care maintenance    Physical today 04/18/18.  Colonoscopy 2012.  Check psa.       HLD (hyperlipidemia)    On crestor.  Low cholesterol diet and exercise.  Follow lipid panel and liver function tests.   Lab Results  Component Value Date   CHOL 134 04/16/2018   HDL 47.00 04/16/2018   LDLCALC 65 04/16/2018   TRIG 108.0 04/16/2018   CHOLHDL 3 04/16/2018        Relevant Medications   lisinopril (PRINIVIL,ZESTRIL) 40 MG tablet   Other Relevant Orders   Hepatic function panel   Lipid panel   Hyperglycemia    Low carb diet and exercise.  Follow met b and a1c.        Relevant Orders   Hemoglobin A1c    Other Visit Diagnoses    Hyperkalemia    -  Primary   Relevant Orders   Potassium (Completed)   Prostate cancer screening       Relevant Orders   PSA, Medicare (Completed)   Need for 23-polyvalent pneumococcal polysaccharide vaccine       Relevant Orders   Pneumococcal polysaccharide vaccine 23-valent greater than or equal to 2yo subcutaneous/IM (Completed)       Einar Pheasant, MD

## 2018-04-20 ENCOUNTER — Encounter: Payer: Self-pay | Admitting: Internal Medicine

## 2018-04-20 NOTE — Assessment & Plan Note (Signed)
Followed by Dr Dew.   

## 2018-04-20 NOTE — Assessment & Plan Note (Signed)
On crestor.  Low cholesterol diet and exercise.  Follow lipid panel and liver function tests.   Lab Results  Component Value Date   CHOL 134 04/16/2018   HDL 47.00 04/16/2018   LDLCALC 65 04/16/2018   TRIG 108.0 04/16/2018   CHOLHDL 3 04/16/2018

## 2018-04-20 NOTE — Assessment & Plan Note (Signed)
Off plavix.  Followed by cardiology.  Continue risk factor modification.  Stable.

## 2018-04-20 NOTE — Assessment & Plan Note (Signed)
Blood pressure under good control.  Continue same medication regimen.  Follow pressures.  Follow metabolic panel.   

## 2018-04-20 NOTE — Assessment & Plan Note (Signed)
Doing well s/p surgery.

## 2018-04-20 NOTE — Assessment & Plan Note (Signed)
Follow cbc.  

## 2018-04-20 NOTE — Assessment & Plan Note (Signed)
Low carb diet and exercise.  Follow met b and a1c.   

## 2018-04-20 NOTE — Assessment & Plan Note (Signed)
On thyroid replacement.  Follow tsh.  

## 2018-05-01 ENCOUNTER — Ambulatory Visit (INDEPENDENT_AMBULATORY_CARE_PROVIDER_SITE_OTHER): Payer: Medicare Other | Admitting: Podiatry

## 2018-05-01 ENCOUNTER — Encounter: Payer: Self-pay | Admitting: Podiatry

## 2018-05-01 DIAGNOSIS — D689 Coagulation defect, unspecified: Secondary | ICD-10-CM

## 2018-05-01 DIAGNOSIS — M21619 Bunion of unspecified foot: Secondary | ICD-10-CM

## 2018-05-01 DIAGNOSIS — M79676 Pain in unspecified toe(s): Secondary | ICD-10-CM

## 2018-05-01 DIAGNOSIS — B351 Tinea unguium: Secondary | ICD-10-CM | POA: Diagnosis not present

## 2018-05-01 NOTE — Progress Notes (Signed)
Complaint:  Visit Type: Patient returns to my office for continued preventative foot care services. Complaint: Patient states" my nails have grown long and thick and become painful to walk and wear shoes"  The patient presents for preventative foot care services. No changes to ROS.  Patient is taking plavix.  Podiatric Exam: Vascular: dorsalis pedis and posterior tibial pulses are palpable bilateral. Capillary return is immediate. Temperature gradient is WNL. Skin turgor WNL  Sensorium: Diminished  Semmes Weinstein monofilament test. Normal tactile sensation bilaterally. Nail Exam: Pt has thick disfigured discolored nails with subungual debris noted bilateral entire nail hallux through fifth toenails Ulcer Exam: There is no evidence of ulcer or pre-ulcerative changes or infection. Orthopedic Exam: Muscle tone and strength are WNL. No limitations in general ROM. No crepitus or effusions noted. Foot type and digits show no abnormalities. HAV  B/L with hammer toes.. Skin: No Porokeratosis. No infection or ulcers  Diagnosis:  Onychomycosis, , Pain in right toe, pain in left toes  Treatment & Plan Procedures and Treatment: Consent by patient was obtained for treatment procedures. The patient understood the discussion of treatment and procedures well. All questions were answered thoroughly reviewed. Debridement of mycotic and hypertrophic toenails, 1 through 5 bilateral and clearing of subungual debris. No ulceration, no infection noted.  Return Visit-Office Procedure: Patient instructed to return to the office for a follow up visit 3 months for continued evaluation and treatment.    [name] DPM 

## 2018-05-18 IMAGING — DX DG KNEE 1-2V PORT*L*
2 series · 2 of 2 positions shown · non-contrast
Comparison: None.

CLINICAL DATA: Status post left knee replacement

EXAM:
PORTABLE LEFT KNEE - 1-2 VIEW

[knee ap]
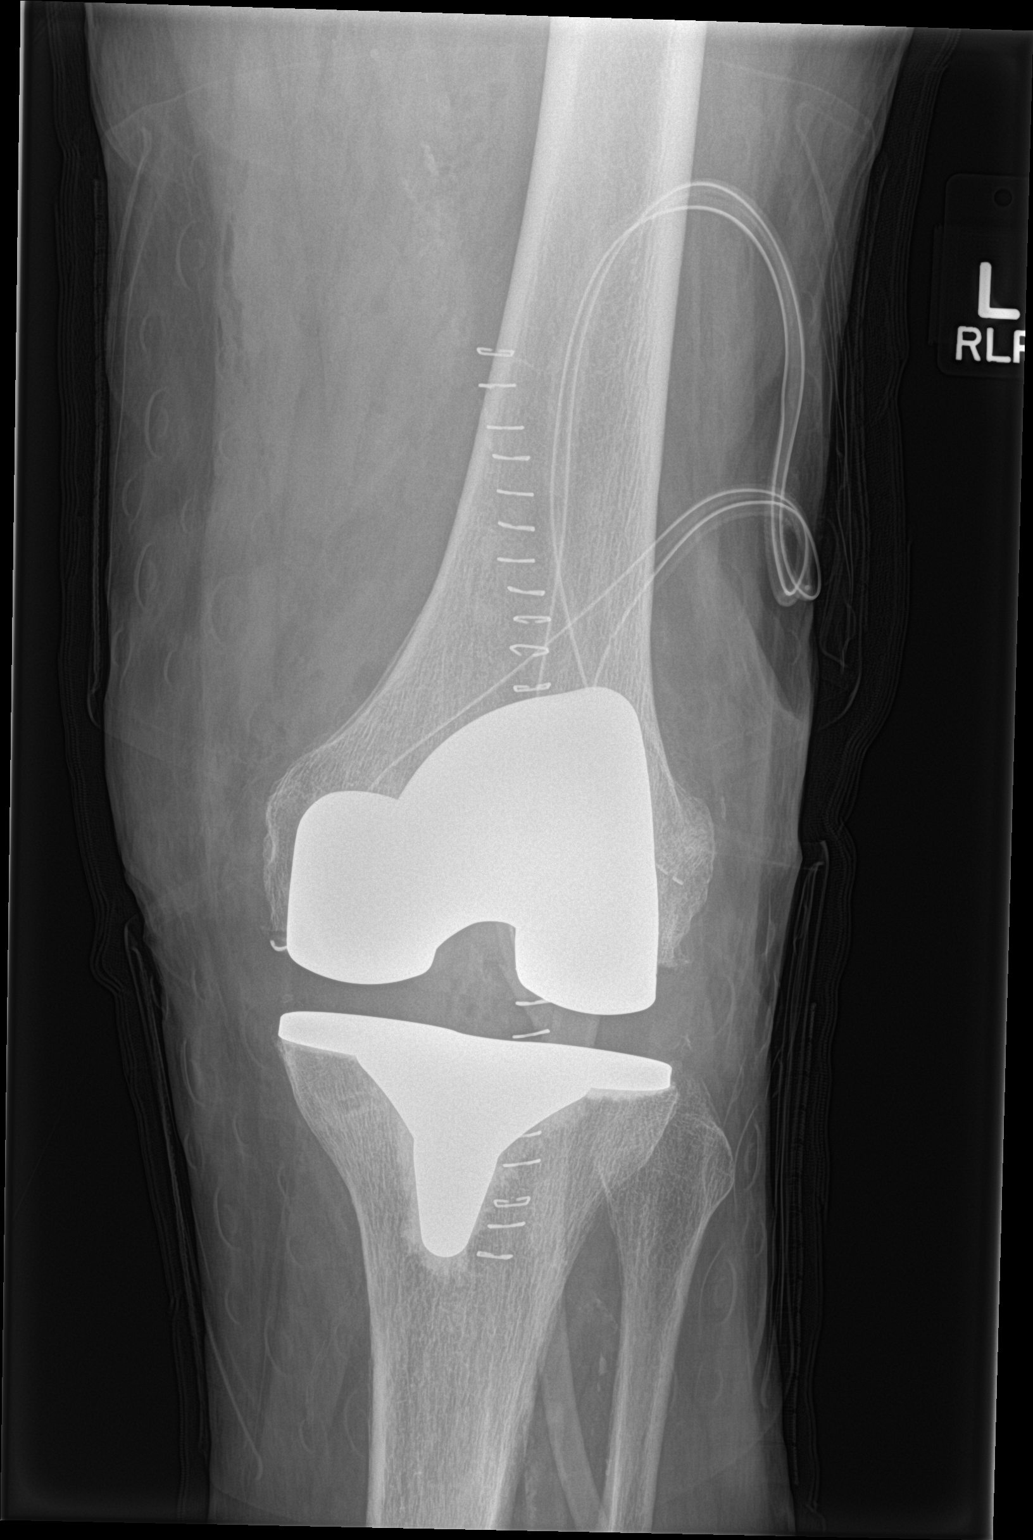

[knee lat]
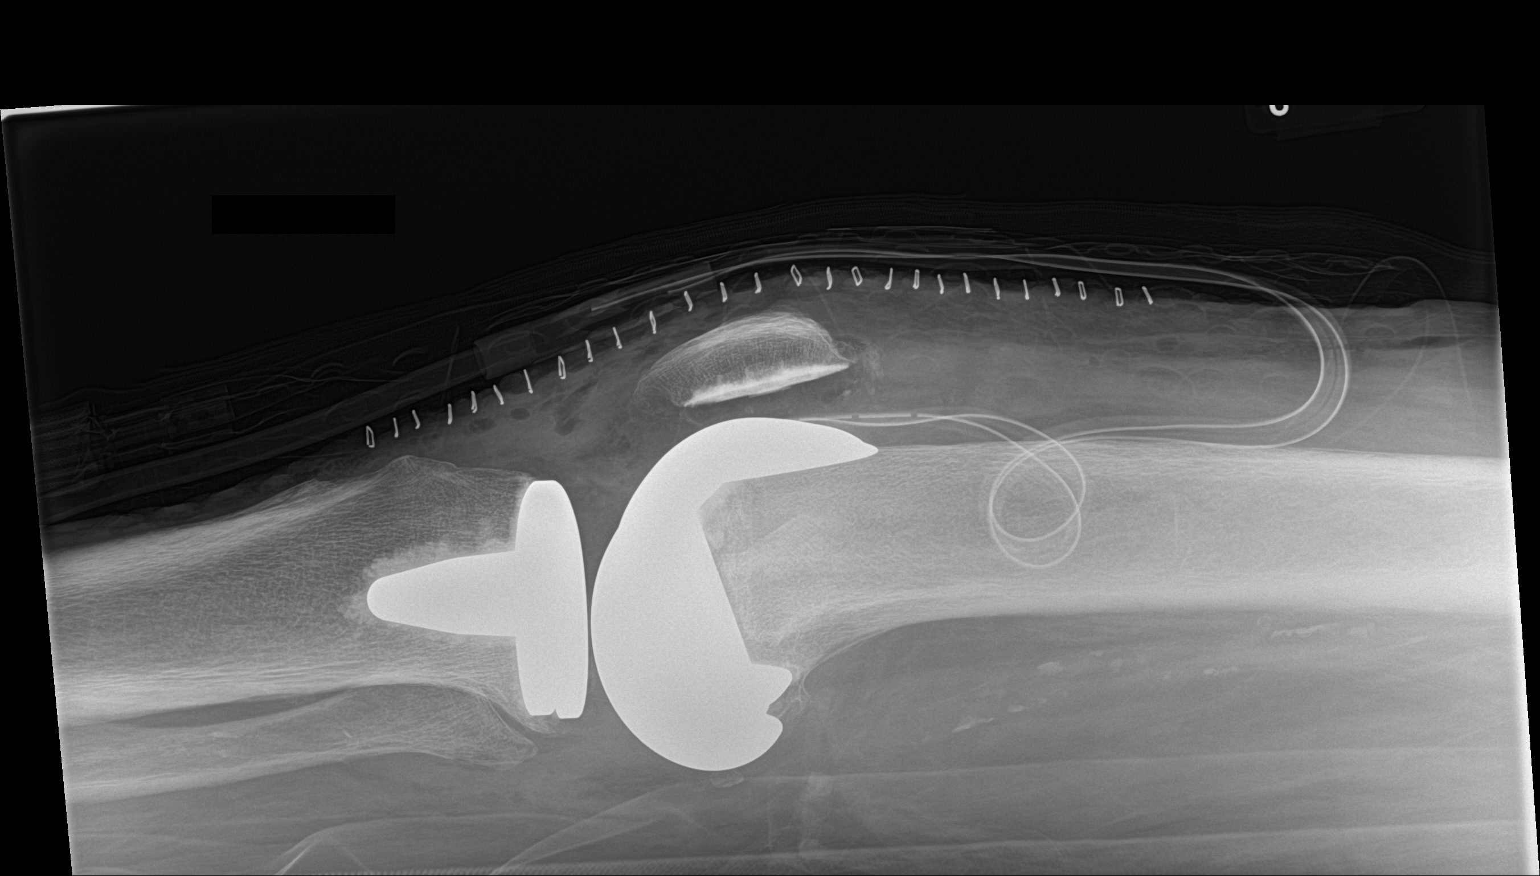

[2 of 2 positions shown; findings below may reference images not displayed]

FINDINGS: Left knee prosthesis is seen in satisfactory position. No acute bony
abnormality is noted. Surgical drains are noted in place.
IMPRESSION: Status post left knee replacement.

## 2018-06-20 DIAGNOSIS — Z23 Encounter for immunization: Secondary | ICD-10-CM | POA: Diagnosis not present

## 2018-06-30 ENCOUNTER — Other Ambulatory Visit: Payer: Self-pay | Admitting: Internal Medicine

## 2018-07-31 ENCOUNTER — Ambulatory Visit: Payer: Medicare Other | Admitting: Podiatry

## 2018-08-07 ENCOUNTER — Other Ambulatory Visit: Payer: Self-pay | Admitting: Internal Medicine

## 2018-08-07 NOTE — Telephone Encounter (Signed)
Last OV 04/18/2018   Last refilled 04/18/2018  Disp 30 with 2 refills   Sent to PCP for approval

## 2018-08-26 ENCOUNTER — Other Ambulatory Visit (INDEPENDENT_AMBULATORY_CARE_PROVIDER_SITE_OTHER): Payer: Medicare Other

## 2018-08-26 DIAGNOSIS — E785 Hyperlipidemia, unspecified: Secondary | ICD-10-CM | POA: Diagnosis not present

## 2018-08-26 DIAGNOSIS — E039 Hypothyroidism, unspecified: Secondary | ICD-10-CM

## 2018-08-26 DIAGNOSIS — R739 Hyperglycemia, unspecified: Secondary | ICD-10-CM | POA: Diagnosis not present

## 2018-08-26 DIAGNOSIS — I10 Essential (primary) hypertension: Secondary | ICD-10-CM | POA: Diagnosis not present

## 2018-08-26 LAB — LIPID PANEL
CHOL/HDL RATIO: 3
Cholesterol: 136 mg/dL (ref 0–200)
HDL: 45.4 mg/dL (ref >39.00)
LDL Cholesterol: 65 mg/dL (ref 0–99)
NONHDL: 90.23
TRIGLYCERIDES: 126 mg/dL (ref 0.0–149.0)
VLDL: 25.2 mg/dL (ref 0.0–40.0)

## 2018-08-26 LAB — HEPATIC FUNCTION PANEL
ALBUMIN: 4.3 g/dL (ref 3.5–5.2)
ALT: 9 U/L (ref 0–53)
AST: 12 U/L (ref 0–37)
Alkaline Phosphatase: 82 U/L (ref 39–117)
Bilirubin, Direct: 0.1 mg/dL (ref 0.0–0.3)
TOTAL PROTEIN: 7.1 g/dL (ref 6.0–8.3)
Total Bilirubin: 0.7 mg/dL (ref 0.2–1.2)

## 2018-08-26 LAB — TSH: TSH: 2.26 u[IU]/mL (ref 0.35–4.50)

## 2018-08-26 LAB — BASIC METABOLIC PANEL
BUN: 9 mg/dL (ref 6–23)
CO2: 28 mEq/L (ref 19–32)
Calcium: 9.6 mg/dL (ref 8.4–10.5)
Chloride: 101 mEq/L (ref 96–112)
Creatinine, Ser: 0.93 mg/dL (ref 0.40–1.50)
GFR: 83.46 mL/min (ref >60.00)
Glucose, Bld: 92 mg/dL (ref 70–99)
Potassium: 4.5 mEq/L (ref 3.5–5.1)
Sodium: 136 mEq/L (ref 135–145)

## 2018-08-26 LAB — HEMOGLOBIN A1C: Hgb A1c MFr Bld: 5.9 % (ref 4.6–6.5)

## 2018-08-28 ENCOUNTER — Ambulatory Visit: Payer: Medicare Other | Admitting: Internal Medicine

## 2018-08-28 ENCOUNTER — Ambulatory Visit (INDEPENDENT_AMBULATORY_CARE_PROVIDER_SITE_OTHER): Payer: Medicare Other | Admitting: Internal Medicine

## 2018-08-28 ENCOUNTER — Encounter: Payer: Self-pay | Admitting: Internal Medicine

## 2018-08-28 DIAGNOSIS — F439 Reaction to severe stress, unspecified: Secondary | ICD-10-CM | POA: Diagnosis not present

## 2018-08-28 DIAGNOSIS — D649 Anemia, unspecified: Secondary | ICD-10-CM | POA: Diagnosis not present

## 2018-08-28 DIAGNOSIS — I6523 Occlusion and stenosis of bilateral carotid arteries: Secondary | ICD-10-CM

## 2018-08-28 DIAGNOSIS — I10 Essential (primary) hypertension: Secondary | ICD-10-CM

## 2018-08-28 DIAGNOSIS — I2510 Atherosclerotic heart disease of native coronary artery without angina pectoris: Secondary | ICD-10-CM | POA: Diagnosis not present

## 2018-08-28 DIAGNOSIS — E785 Hyperlipidemia, unspecified: Secondary | ICD-10-CM

## 2018-08-28 DIAGNOSIS — R739 Hyperglycemia, unspecified: Secondary | ICD-10-CM | POA: Diagnosis not present

## 2018-08-28 DIAGNOSIS — E039 Hypothyroidism, unspecified: Secondary | ICD-10-CM | POA: Diagnosis not present

## 2018-08-28 MED ORDER — TRIAMCINOLONE ACETONIDE 55 MCG/ACT NA AERO: 2.0000 | INHALATION_SPRAY | Freq: Every day | NASAL | 2 refills | Status: DC

## 2018-08-28 NOTE — Progress Notes (Signed)
Patient ID: Daniel Austin, male   DOB: 21-Sep-1941, 77 y.o.   MRN: [id number]   Subjective:    Patient ID: Daniel Austin, male    DOB: [birth year], 77 y.o.   MRN: [id number]  HPI  Patient here for a scheduled follow up.  He states he is doing relatively well.  Knees doing well.  Able to be more active.  No chest pain.  No sob.  No acid reflux.  No abdominal pain.  Bowels stable.  States blood pressures averaging 130s/70s.  Handling stress.     Past Medical History:  Diagnosis Date  . Anemia   . Anxiety   . Arthritis   . CAD (coronary artery disease)   . Cardiomyopathy ([place])   . Carotid artery stenosis    high-grade symptomatic right sided s/p stent twice  . CHF (congestive heart failure) (HCC)    chronic movement fluid and history of CHF he noticed in the   . Depression   . Fractures, multiple    Metal rod in left foot  . GERD (gastroesophageal reflux disease)   . Heart block   . Hypercholesterolemia   . Hyperglycemia   . Hypertension   . Hypothyroidism   . Knee pain   . Presence of permanent cardiac pacemaker   . Shortness of breath dyspnea   . Stroke [facility])    clot in neck broke off left side weakness 2008  resolved  . Syncope    second degree heart block s/p St Jude's pacemaker placement 12/08 with his history is limited with any  . Ulcer of gastric fundus    Past Surgical History:  Procedure Laterality Date  . CARDIAC CATHETERIZATION    . CORONARY ANGIOPLASTY     stents x 3  . heart stint  11/04/2014   [facility]  . HERNIA REPAIR     inguinal  . INSERT / REPLACE / REMOVE PACEMAKER    . JOINT REPLACEMENT     right knee  . KNEE ARTHROPLASTY Right 02/08/2016   Procedure: COMPUTER ASSISTED TOTAL KNEE ARTHROPLASTY;  Surgeon: Dereck Leep, MD;  Location: [facility] ORS;  Service: Orthopedics;  Laterality: Right;  . KNEE ARTHROPLASTY Left 06/27/2016   Procedure: COMPUTER ASSISTED TOTAL KNEE ARTHROPLASTY;  Surgeon: Dereck Leep, MD;  Location: [facility] ORS;  Service:  Orthopedics;  Laterality: Left;  . PACEMAKER INSERTION  08/2007  . PACEMAKER INSERTION N/A 11/16/2015   Procedure: INSERTION PACEMAKER/ PACEMAKER CHANGE OUT;  Surgeon: Isaias Cowman, MD;  Location: [facility] ORS;  Service: Cardiovascular;  Laterality: N/A;  . PERCUTANEOUS PLACEMENT INTRAVASCULAR STENT CERVICAL CAROTID ARTERY     twice, right carotid artery   Family History  Problem Relation Age of Onset  . Heart disease Mother        CHF  . Heart disease Father        CHF  . Thyroid disease Father        hypothyroidism  . Colon polyps Father   . Diabetes Sister        diet controlled  . Thyroid disease Sister   . Cancer Sister        breast   . Thyroid disease Sister   . Cancer Sister        breast  . Colon cancer Neg Hx   . Prostate cancer Neg Hx    Social History   Socioeconomic History  . Marital status: Married    Spouse name: Not on file  . Number of children:  1  . Years of education: Not on file  . Highest education level: Not on file  Occupational History  . Not on file  Social Needs  . Financial resource strain: Not hard at all  . Food insecurity:    Worry: Never true    Inability: Never true  . Transportation needs:    Medical: No    Non-medical: No  Tobacco Use  . Smoking status: Never Smoker  . Smokeless tobacco: Never Used  Substance and Sexual Activity  . Alcohol use: No    Alcohol/week: 0.0 standard drinks  . Drug use: No  . Sexual activity: Not Currently  Lifestyle  . Physical activity:    Days per week: 0 days    Minutes per session: Not on file  . Stress: Not at all  Relationships  . Social connections:    Talks on phone: Patient refused    Gets together: Patient refused    Attends religious service: Patient refused    Active member of club or organization: Patient refused    Attends meetings of clubs or organizations: Patient refused    Relationship status: Patient refused  Other Topics Concern  . Not on file  Social History  Narrative  . Not on file    Outpatient Encounter Medications as of 08/28/2018  Medication Sig  . acetaminophen (TYLENOL) 500 MG tablet Take 1,000 mg by mouth every 6 (six) hours as needed.  Marland Kitchen amLODipine (NORVASC) 2.5 MG tablet TAKE 1 TABLET BY MOUTH EVERY DAY  . Aspirin 81 MG EC tablet Take 81 mg by mouth daily.  . citalopram (CELEXA) 20 MG tablet TAKE 1 TABLET (20 MG TOTAL) BY MOUTH DAILY.  Marland Kitchen clopidogrel (PLAVIX) 75 MG tablet Take 1 tablet (75 mg total) by mouth daily.  . fexofenadine (ALLEGRA) 180 MG tablet Take 180 mg by mouth daily. As needed  . levothyroxine (SYNTHROID) 112 MCG tablet Take 1 tablet (112 mcg total) by mouth daily before breakfast.  . lisinopril (PRINIVIL,ZESTRIL) 40 MG tablet TAKE 1 TABLET (40 MG TOTAL) BY MOUTH DAILY.  Marland Kitchen LORazepam (ATIVAN) 1 MG tablet TAKE 1 TABLET BY MOUTH AT BEDTIME AS NEEDED ANXIETY  . metoprolol tartrate (LOPRESSOR) 25 MG tablet TAKE 1 TABLET BY MOUTH TWICE A DAY  . naproxen sodium (ANAPROX) 220 MG tablet Take by mouth.  . nitroGLYCERIN (NITROSTAT) 0.4 MG SL tablet Place 0.4 mg under the tongue every 5 (five) minutes as needed. If a third tablet is needed please call 911.  . pantoprazole (PROTONIX) 40 MG tablet TAKE 1 TABLET (40 MG TOTAL) BY MOUTH DAILY.  . rosuvastatin (CRESTOR) 20 MG tablet TAKE 1 TABLET (20 MG TOTAL) BY MOUTH NIGHTLY.  . triamcinolone (NASACORT) 55 MCG/ACT AERO nasal inhaler Place 2 sprays into the nose daily.  Marland Kitchen triamcinolone cream (KENALOG) 0.1 % Apply 1 application topically 2 (two) times daily.   No facility-administered encounter medications on file as of 08/28/2018.     Review of Systems  Constitutional: Negative for appetite change and unexpected weight change.  HENT: Negative for congestion and sinus pressure.   Respiratory: Negative for cough, chest tightness and shortness of breath.   Cardiovascular: Negative for chest pain, palpitations and leg swelling.  Gastrointestinal: Negative for abdominal pain, diarrhea,  nausea and vomiting.  Genitourinary: Negative for difficulty urinating and dysuria.  Musculoskeletal: Negative for joint swelling and myalgias.  Skin: Negative for color change and rash.  Neurological: Negative for dizziness, light-headedness and headaches.  Psychiatric/Behavioral: Negative for agitation and dysphoric mood.  Objective:     Blood pressure rechecked by me:  134/84  Physical Exam  Constitutional: He appears well-developed and well-nourished. No distress.  HENT:  Nose: Nose normal.  Mouth/Throat: Oropharynx is clear and moist.  Neck: Neck supple. No thyromegaly present.  Cardiovascular: Normal rate and regular rhythm.  Pulmonary/Chest: Effort normal and breath sounds normal. No respiratory distress.  Abdominal: Soft. Bowel sounds are normal. There is no tenderness.  Musculoskeletal: He exhibits no edema or tenderness.  Lymphadenopathy:    He has no cervical adenopathy.  Skin: No rash noted. No erythema.  Psychiatric: He has a normal mood and affect. His behavior is normal.    BP (!) 144/82 (BP Location: Left Arm, Patient Position: Sitting, Cuff Size: Large)   Pulse 93   Temp 97.9 F (36.6 C) (Oral)   Resp 16   Wt 211 lb 6.4 oz (95.9 kg)   SpO2 98%   BMI 32.14 kg/m  Wt Readings from Last 3 Encounters:  08/28/18 211 lb 6.4 oz (95.9 kg)  04/18/18 212 lb 6.4 oz (96.3 kg)  12/05/17 208 lb 9.6 oz (94.6 kg)     Lab Results  Component Value Date   WBC 4.5 09/12/2017   HGB 13.4 09/12/2017   HCT 40.5 09/12/2017   PLT 228.0 09/12/2017   GLUCOSE 92 08/26/2018   CHOL 136 08/26/2018   TRIG 126.0 08/26/2018   HDL 45.40 08/26/2018   LDLCALC 65 08/26/2018   ALT 9 08/26/2018   AST 12 08/26/2018   NA 136 08/26/2018   K 4.5 08/26/2018   CL 101 08/26/2018   CREATININE 0.93 08/26/2018   BUN 9 08/26/2018   CO2 28 08/26/2018   TSH 2.26 08/26/2018   PSA 0.41 04/18/2018   INR 1.13 06/11/2016   HGBA1C 5.9 08/26/2018   MICROALBUR 1.6 05/25/2014         Assessment & Plan:   Problem List Items Addressed This Visit    Anemia    Follow cbc.       Relevant Orders   CBC with Differential/Platelet   Carotid artery stenosis    Followed by Dr Lucky Cowboy.       Coronary atherosclerosis of native coronary artery    Followed by cardiology.  Continue risk factor modification.  Stable.       Essential hypertension, benign    Blood pressure as outlined.  Continue same medication regimen.  Continue to follow pressures.  Follow metabolic panel.       Relevant Orders   Hepatic function panel   Basic metabolic panel   HLD (hyperlipidemia)    On crestor.  Low cholesterol diet and exercise.  Follow lipid panel and liver function tests.        Relevant Orders   Lipid panel   Hyperglycemia    Low carb diet and exercise.  Follow met b and a1c.       Relevant Orders   Hemoglobin A1c   Hypothyroidism    On thyroid replacement.  Follow tsh.       Stress    Discussed with him today.  Overall feels he is handling things well.  Follow.            Einar Pheasant, MD

## 2018-08-30 ENCOUNTER — Encounter: Payer: Self-pay | Admitting: Internal Medicine

## 2018-08-30 NOTE — Assessment & Plan Note (Signed)
Followed by cardiology.  Continue risk factor modification.  Stable.   

## 2018-08-30 NOTE — Assessment & Plan Note (Signed)
Blood pressure as outlined.  Continue same medication regimen.  Continue to follow pressures.  Follow metabolic panel.

## 2018-08-30 NOTE — Assessment & Plan Note (Signed)
Follow cbc.  

## 2018-08-30 NOTE — Assessment & Plan Note (Signed)
Followed by Dr Dew.   

## 2018-08-30 NOTE — Assessment & Plan Note (Signed)
On thyroid replacement.  Follow tsh.  

## 2018-08-30 NOTE — Assessment & Plan Note (Signed)
On crestor.  Low cholesterol diet and exercise.  Follow lipid panel and liver function tests.   

## 2018-08-30 NOTE — Assessment & Plan Note (Signed)
Discussed with him today.  Overall feels he is handling things well.  Follow.

## 2018-08-30 NOTE — Assessment & Plan Note (Signed)
Low carb diet and exercise.  Follow met b and a1c.  

## 2018-09-12 ENCOUNTER — Ambulatory Visit (INDEPENDENT_AMBULATORY_CARE_PROVIDER_SITE_OTHER): Payer: Medicare Other

## 2018-09-12 VITALS — BP 136/84 | HR 87 | Temp 98.0°F | Resp 16 | Ht 69.0 in | Wt 213.4 lb

## 2018-09-12 DIAGNOSIS — Z0000 Encounter for general adult medical examination without abnormal findings: Secondary | ICD-10-CM | POA: Diagnosis not present

## 2018-09-12 NOTE — Progress Notes (Signed)
Subjective:   Daniel Austin is a 77 y.o. male who presents for Medicare Annual/Subsequent preventive examination.  Review of Systems:  No ROS.  Medicare Wellness Visit. Additional risk factors are reflected in the social history.  Cardiac Risk Factors include: advanced age (>66men, >47 women);male gender;hypertension    Objective:    Vitals: BP 136/84 (BP Location: Left Arm, Patient Position: Sitting, Cuff Size: Normal)   Pulse 87   Temp 98 F (36.7 C) (Oral)   Resp 16   Ht 5\' 9"  (1.753 m)   Wt 213 lb 6.4 oz (96.8 kg)   SpO2 95%   BMI 31.51 kg/m   Body mass index is 31.51 kg/m.  Advanced Directives 09/12/2018 09/12/2017 11/09/2016 09/11/2016 06/27/2016 06/27/2016 06/11/2016  Does Patient Have a Medical Advance Directive? No No No No No No No  Would patient like information on creating a medical advance directive? No - Patient declined Yes (MAU/Ambulatory/Procedural Areas - Information given) - No - Patient declined No - patient declined information - -    Tobacco Social History   Tobacco Use  Smoking Status Never Smoker  Smokeless Tobacco Never Used     Counseling given: Not Answered   Clinical Intake:  Pre-visit preparation completed: Yes  Pain : No/denies pain     Diabetes: No  How often do you need to have someone help you when you read instructions, pamphlets, or other written materials from your doctor or pharmacy?: 1 - Never  Interpreter Needed?: No     Past Medical History:  Diagnosis Date  . Anemia   . Anxiety   . Arthritis   . CAD (coronary artery disease)   . Cardiomyopathy ([place])   . Carotid artery stenosis    high-grade symptomatic right sided s/p stent twice  . CHF (congestive heart failure) (HCC)    chronic movement fluid and history of CHF he noticed in the   . Depression   . Fractures, multiple    Metal rod in left foot  . GERD (gastroesophageal reflux disease)   . Heart block   . Hypercholesterolemia   . Hyperglycemia   .  Hypertension   . Hypothyroidism   . Knee pain   . Presence of permanent cardiac pacemaker   . Shortness of breath dyspnea   . Stroke [facility])    clot in neck broke off left side weakness 2008  resolved  . Syncope    second degree heart block s/p St Jude's pacemaker placement 12/08 with his history is limited with any  . Ulcer of gastric fundus    Past Surgical History:  Procedure Laterality Date  . CARDIAC CATHETERIZATION    . CORONARY ANGIOPLASTY     stents x 3  . heart stint  11/04/2014   [facility]  . HERNIA REPAIR     inguinal  . INSERT / REPLACE / REMOVE PACEMAKER    . JOINT REPLACEMENT     right knee  . KNEE ARTHROPLASTY Right 02/08/2016   Procedure: COMPUTER ASSISTED TOTAL KNEE ARTHROPLASTY;  Surgeon: Dereck Leep, MD;  Location: [facility] ORS;  Service: Orthopedics;  Laterality: Right;  . KNEE ARTHROPLASTY Left 06/27/2016   Procedure: COMPUTER ASSISTED TOTAL KNEE ARTHROPLASTY;  Surgeon: Dereck Leep, MD;  Location: [facility] ORS;  Service: Orthopedics;  Laterality: Left;  . PACEMAKER INSERTION  08/2007  . PACEMAKER INSERTION N/A 11/16/2015   Procedure: INSERTION PACEMAKER/ PACEMAKER CHANGE OUT;  Surgeon: Isaias Cowman, MD;  Location: [facility] ORS;  Service: Cardiovascular;  Laterality:  N/A;  . PERCUTANEOUS PLACEMENT INTRAVASCULAR STENT CERVICAL CAROTID ARTERY     twice, right carotid artery   Family History  Problem Relation Age of Onset  . Heart disease Mother        CHF  . Heart disease Father        CHF  . Thyroid disease Father        hypothyroidism  . Colon polyps Father   . Diabetes Sister        diet controlled  . Thyroid disease Sister   . Cancer Sister        breast   . Thyroid disease Sister   . Cancer Sister        breast  . Colon cancer Neg Hx   . Prostate cancer Neg Hx    Social History   Socioeconomic History  . Marital status: Married    Spouse name: Not on file  . Number of children: 1  . Years of education: Not on file  . Highest education  level: Not on file  Occupational History  . Not on file  Social Needs  . Financial resource strain: Not hard at all  . Food insecurity:    Worry: Never true    Inability: Never true  . Transportation needs:    Medical: No    Non-medical: No  Tobacco Use  . Smoking status: Never Smoker  . Smokeless tobacco: Never Used  Substance and Sexual Activity  . Alcohol use: No    Alcohol/week: 0.0 standard drinks  . Drug use: No  . Sexual activity: Not Currently  Lifestyle  . Physical activity:    Days per week: 0 days    Minutes per session: Not on file  . Stress: Not at all  Relationships  . Social connections:    Talks on phone: Patient refused    Gets together: Patient refused    Attends religious service: Patient refused    Active member of club or organization: Patient refused    Attends meetings of clubs or organizations: Patient refused    Relationship status: Patient refused  Other Topics Concern  . Not on file  Social History Narrative  . Not on file    Outpatient Encounter Medications as of 09/12/2018  Medication Sig  . acetaminophen (TYLENOL) 500 MG tablet Take 1,000 mg by mouth every 6 (six) hours as needed.  Marland Kitchen amLODipine (NORVASC) 2.5 MG tablet TAKE 1 TABLET BY MOUTH EVERY DAY  . Aspirin 81 MG EC tablet Take 81 mg by mouth daily.  . citalopram (CELEXA) 20 MG tablet TAKE 1 TABLET (20 MG TOTAL) BY MOUTH DAILY.  Marland Kitchen clopidogrel (PLAVIX) 75 MG tablet Take 1 tablet (75 mg total) by mouth daily.  . fexofenadine (ALLEGRA) 180 MG tablet Take 180 mg by mouth daily. As needed  . levothyroxine (SYNTHROID) 112 MCG tablet Take 1 tablet (112 mcg total) by mouth daily before breakfast.  . lisinopril (PRINIVIL,ZESTRIL) 40 MG tablet TAKE 1 TABLET (40 MG TOTAL) BY MOUTH DAILY.  Marland Kitchen LORazepam (ATIVAN) 1 MG tablet TAKE 1 TABLET BY MOUTH AT BEDTIME AS NEEDED ANXIETY  . metoprolol tartrate (LOPRESSOR) 25 MG tablet TAKE 1 TABLET BY MOUTH TWICE A DAY  . naproxen sodium (ANAPROX) 220 MG  tablet Take by mouth.  . nitroGLYCERIN (NITROSTAT) 0.4 MG SL tablet Place 0.4 mg under the tongue every 5 (five) minutes as needed. If a third tablet is needed please call 911.  . pantoprazole (PROTONIX) 40 MG tablet TAKE 1  TABLET (40 MG TOTAL) BY MOUTH DAILY.  . rosuvastatin (CRESTOR) 20 MG tablet TAKE 1 TABLET (20 MG TOTAL) BY MOUTH NIGHTLY.  . triamcinolone (NASACORT) 55 MCG/ACT AERO nasal inhaler Place 2 sprays into the nose daily.  Marland Kitchen triamcinolone cream (KENALOG) 0.1 % Apply 1 application topically 2 (two) times daily.   No facility-administered encounter medications on file as of 09/12/2018.     Activities of Daily Living In your present state of health, do you have any difficulty performing the following activities: 09/12/2018 09/12/2017  Hearing? N N  Vision? N N  Difficulty concentrating or making decisions? N N  Walking or climbing stairs? N Y  Dressing or bathing? N N  Doing errands, shopping? N N  Preparing Food and eating ? N N  Using the Toilet? N N  In the past six months, have you accidently leaked urine? N N  Do you have problems with loss of bowel control? N N  Managing your Medications? N N  Managing your Finances? N N  Housekeeping or managing your Housekeeping? N N  Some recent data might be hidden    Patient Care Team: Einar Pheasant, MD as PCP - General (Internal Medicine)   Assessment:   This is a routine wellness examination for [place].  Keep all routine scheduled appointments.   Health Screenings  Colonoscopy-12/20/10 Hearing-passes the whisper test Hemoglobin A1C-08/26/18 (5.9) Cholesterol-08/26/18 (136)  Social  Alcohol intake-none Smoking history- none Smokers in home?none Illicit drug use?none Exercise-none. Plans to start [place] exercise with wife. Diet-regular Sexually Active-not currently  Safety  Patient feels safe at home.  Patient does have smoke detectors at home  Patient does wear sunscreen or protective clothing when  in direct sunlight  Patient does wear seat belt when driving or riding with others.  Activities of Daily Living Patient can do their own household chores. Denies needing assistance with: driving, feeding themselves, getting from bed to chair, getting to the toilet, bathing/showering, dressing, managing money, climbing flight of stairs, or preparing meals.   Depression Screen Patient denies losing interest in daily life, feeling hopeless, or crying easily over simple problems.   Fall Screen Patient denies being afraid of falling or falling in the last year.   Memory Screen Patient denies problems with memory, misplacing items, and is able to balance checkbook/bank accounts.  Patient is alert, normal appearance, oriented to person/place/and time. Correctly identified the president of the [place], recall of 2/3 objects, and performing simple calculations.  Patient displays appropriate judgement and can read correct time from watch face.   Immunizations The following Immunizations are up to date: Influenza, and pneumonia. TDAP deferred per patient request.   Other Providers Patient Care Team: Einar Pheasant, MD as PCP - General (Internal Medicine)  Exercise Activities and Dietary recommendations Current Exercise Habits: The patient does not participate in regular exercise at present  Goals      Patient Stated   . Increase physical activity (pt-stated)     Start [place] program with wife.       Fall Risk Fall Risk  09/12/2018 09/12/2017 05/03/2017 09/11/2016 09/07/2016  Falls in the past year? 0 No No No No   Depression Screen PHQ 2/9 Scores 09/12/2018 09/12/2017 05/03/2017 09/11/2016  PHQ - 2 Score 0 0 0 1  PHQ- 9 Score - 0 - -    Cognitive Function MMSE - Mini Mental State Exam 09/12/2017  Orientation to time 5  Orientation to Place 5  Registration 3  Attention/ Calculation  5  Recall 3  Language- name 2 objects 2  Language- repeat 1  Language- follow 3 step  command 3  Language- read & follow direction 1  Write a sentence 1  Copy design 1  Total score 30     6CIT Screen 09/12/2018 09/11/2016  What Year? 0 points 0 points  What month? 0 points 0 points  What time? 0 points 0 points  Count back from 20 0 points 0 points  Months in reverse 0 points 0 points  Repeat phrase 0 points -  Total Score 0 -    Immunization History  Administered Date(s) Administered  . DTaP 11/04/2006  . Influenza Nasal 09/03/2012, 07/06/2013  . Influenza, High Dose Seasonal PF 10/25/2016, 09/25/2017, 06/20/2018  . Influenza,inj,Quad PF,6+ Mos 05/28/2014, 05/25/2015  . Influenza-Unspecified 07/22/2012, 07/17/2013, 05/28/2014, 05/25/2015  . Pneumococcal Conjugate-13 01/10/2017  . Pneumococcal Polysaccharide-23 04/18/2018   Screening Tests Health Maintenance  Topic Date Due  . TETANUS/TDAP  05/18/1959  . INFLUENZA VACCINE  Completed  . PNA vac Low Risk Adult  Completed       Plan:   End of life planning; Advanced aging; Advanced directives discussed.  No HCPOA/Living Will.  Additional information declined at this time.  I have personally reviewed and noted the following in the patient's chart:   . Medical and social history . Use of alcohol, tobacco or illicit drugs  . Current medications and supplements . Functional ability and status . Nutritional status . Physical activity . Advanced directives . List of other physicians . Hospitalizations, surgeries, and ER visits in previous 12 months . Vitals . Screenings to include cognitive, depression, and falls . Referrals and appointments  In addition, I have reviewed and discussed with patient certain preventive protocols, quality metrics, and best practice recommendations. A written personalized care plan for preventive services as well as general preventive health recommendations were provided to patient.     Varney Biles, LPN  [id number]/18/[id number]   Reviewed above information.  Agree with  assessment and plan.    Dr Nicki Reaper

## 2018-09-12 NOTE — Patient Instructions (Addendum)
  [name] , Thank you for taking time to come for your Medicare Wellness Visit. I appreciate your ongoing commitment to your health goals. Please review the following plan we discussed and let me know if I can assist you in the future.   These are the goals we discussed: Goals      Patient Stated   . Increase physical activity (pt-stated)     Start [place] program with wife.       This is a list of the screening recommended for you and due dates:  Health Maintenance  Topic Date Due  . Tetanus Vaccine  05/18/1959  . Flu Shot  Completed  . Pneumonia vaccines  Completed

## 2018-09-15 ENCOUNTER — Ambulatory Visit: Payer: Medicare Other

## 2018-09-19 ENCOUNTER — Other Ambulatory Visit: Payer: Self-pay | Admitting: Internal Medicine

## 2018-10-01 DIAGNOSIS — I420 Dilated cardiomyopathy: Secondary | ICD-10-CM | POA: Diagnosis not present

## 2018-10-01 DIAGNOSIS — I459 Conduction disorder, unspecified: Secondary | ICD-10-CM | POA: Diagnosis not present

## 2018-10-01 DIAGNOSIS — I10 Essential (primary) hypertension: Secondary | ICD-10-CM | POA: Diagnosis not present

## 2018-10-01 DIAGNOSIS — I5022 Chronic systolic (congestive) heart failure: Secondary | ICD-10-CM | POA: Diagnosis not present

## 2018-10-01 DIAGNOSIS — I6521 Occlusion and stenosis of right carotid artery: Secondary | ICD-10-CM | POA: Diagnosis not present

## 2018-10-01 DIAGNOSIS — I255 Ischemic cardiomyopathy: Secondary | ICD-10-CM | POA: Diagnosis not present

## 2018-10-13 ENCOUNTER — Other Ambulatory Visit: Payer: Self-pay | Admitting: Internal Medicine

## 2018-11-01 ENCOUNTER — Other Ambulatory Visit: Payer: Self-pay | Admitting: Internal Medicine

## 2018-11-04 ENCOUNTER — Other Ambulatory Visit: Payer: Self-pay | Admitting: Internal Medicine

## 2018-11-08 ENCOUNTER — Other Ambulatory Visit: Payer: Self-pay | Admitting: Internal Medicine

## 2018-11-11 NOTE — Telephone Encounter (Signed)
rx ok'd for lorazepam #30 with 1 refill.

## 2018-11-25 ENCOUNTER — Other Ambulatory Visit: Payer: Self-pay | Admitting: Internal Medicine

## 2018-11-27 ENCOUNTER — Encounter: Payer: Self-pay | Admitting: Podiatry

## 2018-11-27 ENCOUNTER — Ambulatory Visit (INDEPENDENT_AMBULATORY_CARE_PROVIDER_SITE_OTHER): Payer: Medicare Other | Admitting: Podiatry

## 2018-11-27 DIAGNOSIS — M21619 Bunion of unspecified foot: Secondary | ICD-10-CM

## 2018-11-27 DIAGNOSIS — D689 Coagulation defect, unspecified: Secondary | ICD-10-CM | POA: Diagnosis not present

## 2018-11-27 DIAGNOSIS — B351 Tinea unguium: Secondary | ICD-10-CM | POA: Diagnosis not present

## 2018-11-27 NOTE — Progress Notes (Signed)
Complaint:  Visit Type: Patient returns to my office for continued preventative foot care services. Complaint: Patient states" my nails have grown long and thick and become painful to walk and wear shoes"  The patient presents for preventative foot care services. No changes to ROS.  Patient is taking plavix.  Podiatric Exam: Vascular: dorsalis pedis and posterior tibial pulses are palpable bilateral. Capillary return is immediate. Temperature gradient is WNL. Skin turgor WNL  Sensorium: Diminished  Semmes Weinstein monofilament test. Normal tactile sensation bilaterally. Nail Exam: Pt has thick disfigured discolored nails with subungual debris noted bilateral entire nail hallux through fifth toenails Ulcer Exam: There is no evidence of ulcer or pre-ulcerative changes or infection. Orthopedic Exam: Muscle tone and strength are WNL. No limitations in general ROM. No crepitus or effusions noted. Foot type and digits show no abnormalities. HAV  B/L with hammer toes.. Skin: No Porokeratosis. No infection or ulcers  Diagnosis:  Onychomycosis, , Pain in right toe, pain in left toes  Treatment & Plan Procedures and Treatment: Consent by patient was obtained for treatment procedures. The patient understood the discussion of treatment and procedures well. All questions were answered thoroughly reviewed. Debridement of mycotic and hypertrophic toenails, 1 through 5 bilateral and clearing of subungual debris. No ulceration, no infection noted.  Return Visit-Office Procedure: Patient instructed to return to the office for a follow up visit 3 months for continued evaluation and treatment.    Coty Student DPM 

## 2018-11-29 ENCOUNTER — Other Ambulatory Visit: Payer: Self-pay | Admitting: Internal Medicine

## 2018-12-29 ENCOUNTER — Telehealth: Payer: Self-pay | Admitting: Internal Medicine

## 2018-12-29 NOTE — Telephone Encounter (Signed)
LMTCB

## 2018-12-29 NOTE — Telephone Encounter (Signed)
Copied from [place] [id number]. Topic: Quick Communication - See Telephone Encounter >> Dec 29, 2018  8:59 AM Loma Boston wrote: CRM for notification. See Telephone encounter for: 12/29/18. PT called in about a FU appt on 4/7 labs and 4/9 appt with Charlene. Spoke with Butch Penny, nothing pertaining to a virtual in chart, requesting a CRM to continue solution.  Pt is having phone issues, previous call said he could not hear, this call while waiting pt was line was dropped. Please FU with PT

## 2018-12-30 ENCOUNTER — Other Ambulatory Visit: Payer: Medicare Other

## 2018-12-30 NOTE — Telephone Encounter (Signed)
Phone appt scheduled per notes on schedule but pt did not come in to have labs done

## 2019-01-01 ENCOUNTER — Ambulatory Visit (INDEPENDENT_AMBULATORY_CARE_PROVIDER_SITE_OTHER): Payer: Medicare Other | Admitting: Internal Medicine

## 2019-01-01 DIAGNOSIS — E039 Hypothyroidism, unspecified: Secondary | ICD-10-CM

## 2019-01-01 DIAGNOSIS — Z96651 Presence of right artificial knee joint: Secondary | ICD-10-CM

## 2019-01-01 DIAGNOSIS — F439 Reaction to severe stress, unspecified: Secondary | ICD-10-CM

## 2019-01-01 DIAGNOSIS — I6523 Occlusion and stenosis of bilateral carotid arteries: Secondary | ICD-10-CM

## 2019-01-01 DIAGNOSIS — R739 Hyperglycemia, unspecified: Secondary | ICD-10-CM

## 2019-01-01 DIAGNOSIS — I2510 Atherosclerotic heart disease of native coronary artery without angina pectoris: Secondary | ICD-10-CM

## 2019-01-01 DIAGNOSIS — D649 Anemia, unspecified: Secondary | ICD-10-CM

## 2019-01-01 DIAGNOSIS — I10 Essential (primary) hypertension: Secondary | ICD-10-CM

## 2019-01-01 MED ORDER — LORAZEPAM 1 MG PO TABS: ORAL_TABLET | ORAL | 1 refills | Status: DC

## 2019-01-01 NOTE — Progress Notes (Addendum)
Patient ID: Daniel Austin, male   DOB: 06-08-1941, 78 y.o.   MRN: [id number] Telephone Visit:  Note  I connected with Daniel Austin on 01/01/19 at 10:00 AM EDT by telephone.  Verified that I am speaking with the correct person using two identifiers.  Location patient: home Location provider:work  Persons participating in the virtual visit: patient, provider  I discussed the limitations of evaluation and management by telephone.  This visit type was conducted due to national recommendations for restrictions regarding the COVID-19 pandemic.  This format is felt to be most appropriate for this patient at this time.  The patient expressed understanding and agreed to proceed.   HPI: This was a scheduled follow up.  He reports he is doing well.  Staying inside.  Has not had any known COVID exposure.  No chest pain.  No sob.  No acid reflux.  No abdominal pain.  Bowels moving.  No urine change.  States blood pressures have been averaging 120s/70-80s.  Knees doing well.  Some allergy issues with the pollen, but reports this is controlled.  Does not need any further intervention.  Saw cardiology 09/2018.  Stable.  Recommended f/u in 6 months.     ROS: See pertinent positives and negatives per HPI.  Past Medical History:  Diagnosis Date  . Anemia   . Anxiety   . Arthritis   . CAD (coronary artery disease)   . Cardiomyopathy ([place])   . Carotid artery stenosis    high-grade symptomatic right sided s/p stent twice  . CHF (congestive heart failure) (HCC)    chronic movement fluid and history of CHF he noticed in the   . Depression   . Fractures, multiple    Metal rod in left foot  . GERD (gastroesophageal reflux disease)   . Heart block   . Hypercholesterolemia   . Hyperglycemia   . Hypertension   . Hypothyroidism   . Knee pain   . Presence of permanent cardiac pacemaker   . Shortness of breath dyspnea   . Stroke [facility])    clot in neck broke off left side weakness 2008  resolved  . Syncope     second degree heart block s/p St Jude's pacemaker placement 12/08 with his history is limited with any  . Ulcer of gastric fundus     Past Surgical History:  Procedure Laterality Date  . CARDIAC CATHETERIZATION    . CORONARY ANGIOPLASTY     stents x 3  . heart stint  11/04/2014   [facility]  . HERNIA REPAIR     inguinal  . INSERT / REPLACE / REMOVE PACEMAKER    . JOINT REPLACEMENT     right knee  . KNEE ARTHROPLASTY Right 02/08/2016   Procedure: COMPUTER ASSISTED TOTAL KNEE ARTHROPLASTY;  Surgeon: Dereck Leep, MD;  Location: [facility] ORS;  Service: Orthopedics;  Laterality: Right;  . KNEE ARTHROPLASTY Left 06/27/2016   Procedure: COMPUTER ASSISTED TOTAL KNEE ARTHROPLASTY;  Surgeon: Dereck Leep, MD;  Location: [facility] ORS;  Service: Orthopedics;  Laterality: Left;  . PACEMAKER INSERTION  08/2007  . PACEMAKER INSERTION N/A 11/16/2015   Procedure: INSERTION PACEMAKER/ PACEMAKER CHANGE OUT;  Surgeon: Isaias Cowman, MD;  Location: [facility] ORS;  Service: Cardiovascular;  Laterality: N/A;  . PERCUTANEOUS PLACEMENT INTRAVASCULAR STENT CERVICAL CAROTID ARTERY     twice, right carotid artery    Family History  Problem Relation Age of Onset  . Heart disease Mother        CHF  .  Heart disease Father        CHF  . Thyroid disease Father        hypothyroidism  . Colon polyps Father   . Diabetes Sister        diet controlled  . Thyroid disease Sister   . Cancer Sister        breast   . Thyroid disease Sister   . Cancer Sister        breast  . Colon cancer Neg Hx   . Prostate cancer Neg Hx     SOCIAL HX: reviewed.    Current Outpatient Medications:  .  acetaminophen (TYLENOL) 500 MG tablet, Take 1,000 mg by mouth every 6 (six) hours as needed., Disp: , Rfl:  .  amLODipine (NORVASC) 2.5 MG tablet, TAKE 1 TABLET BY MOUTH EVERY DAY, Disp: 30 tablet, Rfl: 5 .  Aspirin 81 MG EC tablet, Take 81 mg by mouth daily., Disp: , Rfl:  .  citalopram (CELEXA) 20 MG tablet, TAKE 1 TABLET  BY MOUTH EVERY DAY, Disp: 90 tablet, Rfl: 2 .  clopidogrel (PLAVIX) 75 MG tablet, TAKE 1 TABLET BY MOUTH EVERY DAY, Disp: 30 tablet, Rfl: 5 .  fexofenadine (ALLEGRA) 180 MG tablet, Take 180 mg by mouth daily. As needed, Disp: , Rfl:  .  levothyroxine (SYNTHROID, LEVOTHROID) 112 MCG tablet, TAKE 1 TABLET (112 MCG TOTAL) BY MOUTH DAILY BEFORE BREAKFAST., Disp: 90 tablet, Rfl: 1 .  lisinopril (PRINIVIL,ZESTRIL) 40 MG tablet, TAKE 1 TABLET BY MOUTH EVERY DAY, Disp: 30 tablet, Rfl: 5 .  LORazepam (ATIVAN) 1 MG tablet, Take one tablet q hs prn, Disp: 30 tablet, Rfl: 1 .  metoprolol tartrate (LOPRESSOR) 25 MG tablet, TAKE 1 TABLET BY MOUTH TWICE A DAY, Disp: 180 tablet, Rfl: 1 .  nitroGLYCERIN (NITROSTAT) 0.4 MG SL tablet, Place 0.4 mg under the tongue every 5 (five) minutes as needed. If a third tablet is needed please call 911., Disp: , Rfl:  .  pantoprazole (PROTONIX) 40 MG tablet, TAKE 1 TABLET (40 MG TOTAL) BY MOUTH DAILY., Disp: 30 tablet, Rfl: 8 .  rosuvastatin (CRESTOR) 20 MG tablet, TAKE 1 TABLET (20 MG TOTAL) BY MOUTH NIGHTLY., Disp: 30 tablet, Rfl: 8 .  triamcinolone (NASACORT) 55 MCG/ACT AERO nasal inhaler, Place into the nose., Disp: , Rfl:  .  triamcinolone cream (KENALOG) 0.1 %, Apply 1 application topically 2 (two) times daily., Disp: 30 g, Rfl: 0 .  naproxen sodium (ANAPROX) 220 MG tablet, Take by mouth., Disp: , Rfl:   EXAM:  VITALS per patient if applicable:  287/86 this am.  Blood pressure yesterday;  123/79.  Weight 211 pounds.    GENERAL: sounds to be in no acute distress.    PSYCH/NEURO: pleasant and cooperative, no obvious depression or anxiety, speech and thought processing grossly intact  ASSESSMENT AND PLAN:  Discussed the following assessment and plan:  Adult hypothyroidism  Anemia, unspecified type  Benign essential HTN  Bilateral carotid artery stenosis  Atherosclerosis of native coronary artery of native heart without angina pectoris  History of total right  knee replacement  Hyperglycemia  Stress     I discussed the assessment and treatment plan with the patient. The patient was provided an opportunity to ask questions and all were answered. The patient agreed with the plan and demonstrated an understanding of the instructions.   The patient was advised to call back or seek an in-person evaluation if the symptoms worsen or if the condition fails to improve as  anticipated.  I provided 15 minutes of non-face-to-face time during this encounter.   Einar Pheasant, MD

## 2019-01-04 ENCOUNTER — Encounter: Payer: Self-pay | Admitting: Internal Medicine

## 2019-01-04 NOTE — Assessment & Plan Note (Signed)
Low carb diet and exercise.  Follow met b and a1c.   

## 2019-01-04 NOTE — Assessment & Plan Note (Signed)
Followed by Dr Dew.   

## 2019-01-04 NOTE — Assessment & Plan Note (Signed)
Doing well s/p surgery.

## 2019-01-04 NOTE — Assessment & Plan Note (Signed)
Blood pressure as outlined.  Under good control.  Same medications.  Follow pressures.  Follow metabolic panel.

## 2019-01-04 NOTE — Assessment & Plan Note (Signed)
Discussed with him today.  Overall he feels he is doing well.  Taking lorazepam.  Follow.

## 2019-01-04 NOTE — Assessment & Plan Note (Signed)
Followed by cardiology.  Stable.  Continue risk factor modification.   

## 2019-01-04 NOTE — Assessment & Plan Note (Signed)
On thyroid medication.  Follow tsh.  

## 2019-01-04 NOTE — Assessment & Plan Note (Signed)
Follow cbc.  

## 2019-01-20 ENCOUNTER — Encounter: Payer: Self-pay | Admitting: Internal Medicine

## 2019-01-20 ENCOUNTER — Ambulatory Visit (INDEPENDENT_AMBULATORY_CARE_PROVIDER_SITE_OTHER): Payer: Medicare Other | Admitting: Internal Medicine

## 2019-01-20 ENCOUNTER — Telehealth: Payer: Self-pay | Admitting: *Deleted

## 2019-01-20 DIAGNOSIS — W540XXA Bitten by dog, initial encounter: Secondary | ICD-10-CM

## 2019-01-20 DIAGNOSIS — S61246A Puncture wound with foreign body of right little finger without damage to nail, initial encounter: Secondary | ICD-10-CM | POA: Diagnosis not present

## 2019-01-20 DIAGNOSIS — S61240A Puncture wound with foreign body of right index finger without damage to nail, initial encounter: Secondary | ICD-10-CM | POA: Diagnosis not present

## 2019-01-20 MED ORDER — ALIGN 4 MG PO CAPS: ORAL_CAPSULE | ORAL | 0 refills | Status: DC

## 2019-01-20 MED ORDER — AMOXICILLIN-POT CLAVULANATE 875-125 MG PO TABS: 1.0000 | ORAL_TABLET | Freq: Two times a day (BID) | ORAL | 0 refills | Status: DC

## 2019-01-20 NOTE — Progress Notes (Addendum)
Patient ID: Daniel Austin, male   DOB: [birth year], 78 y.o.   MRN: [id number] Virtual Visit via Telephone Note  This visit type was conducted due to national recommendations for restrictions regarding the COVID-19 pandemic (e.g. social distancing).  This format is felt to be most appropriate for this patient at this time.  All issues noted in this document were discussed and addressed.  No physical exam was performed (except for noted visual exam findings with Video Visits).   I connected with Daniel Austin on 01/20/19 at  4:30 PM EDT telephone and verified that I am speaking with the correct person using two identifiers. Location patient: home Location provider: work  Persons participating in the telephone visit: patient, provider  I discussed the limitations, risks, security and privacy concerns of performing an evaluation and management service by telephone and the availability of in person appointments. The patient expressed understanding and agreed to proceed.   Reason for visit: acute visit.    HPI: Called in with concerns regarding dog bite.  States he was taking table scraps out to his neighbors dog.  While the dog was eating, the wind blew his bowl over. He was picking up the scraps and reached down while the dog was eating - to move food closer to the dog.  The dog "snapped" at him.  States he has a small puncture wound base of the first finger and a small puncture wound bas of fifth finger.  (tooth caught).  This occurred yesterday.  He cleaned the area - initially with water and soap.  Has subsequently cleaned with betadine. Has applied neosporin.  States the swelling is better this am.  Describes minimal soft tissue swelling now.  No redness.  No pain.  States he can make a fist and use his hand without any problem or pain.  No fever.  Is over due tetanus.  The dog is his neighbor's dog.  She is up to date with her vaccinations.     ROS: See pertinent positives and negatives per  HPI.  Past Medical History:  Diagnosis Date  . Anemia   . Anxiety   . Arthritis   . CAD (coronary artery disease)   . Cardiomyopathy ([place])   . Carotid artery stenosis    high-grade symptomatic right sided s/p stent twice  . CHF (congestive heart failure) (HCC)    chronic movement fluid and history of CHF he noticed in the   . Depression   . Fractures, multiple    Metal rod in left foot  . GERD (gastroesophageal reflux disease)   . Heart block   . Hypercholesterolemia   . Hyperglycemia   . Hypertension   . Hypothyroidism   . Knee pain   . Presence of permanent cardiac pacemaker   . Shortness of breath dyspnea   . Stroke [facility])    clot in neck broke off left side weakness 2008  resolved  . Syncope    second degree heart block s/p St Jude's pacemaker placement 12/08 with his history is limited with any  . Ulcer of gastric fundus     Past Surgical History:  Procedure Laterality Date  . CARDIAC CATHETERIZATION    . CORONARY ANGIOPLASTY     stents x 3  . heart stint  11/04/2014   [facility]  . HERNIA REPAIR     inguinal  . INSERT / REPLACE / REMOVE PACEMAKER    . JOINT REPLACEMENT     right knee  . KNEE  ARTHROPLASTY Right 02/08/2016   Procedure: COMPUTER ASSISTED TOTAL KNEE ARTHROPLASTY;  Surgeon: Dereck Leep, MD;  Location: [facility] ORS;  Service: Orthopedics;  Laterality: Right;  . KNEE ARTHROPLASTY Left 06/27/2016   Procedure: COMPUTER ASSISTED TOTAL KNEE ARTHROPLASTY;  Surgeon: Dereck Leep, MD;  Location: [facility] ORS;  Service: Orthopedics;  Laterality: Left;  . PACEMAKER INSERTION  08/2007  . PACEMAKER INSERTION N/A 11/16/2015   Procedure: INSERTION PACEMAKER/ PACEMAKER CHANGE OUT;  Surgeon: Isaias Cowman, MD;  Location: [facility] ORS;  Service: Cardiovascular;  Laterality: N/A;  . PERCUTANEOUS PLACEMENT INTRAVASCULAR STENT CERVICAL CAROTID ARTERY     twice, right carotid artery    Family History  Problem Relation Age of Onset  . Heart disease Mother        CHF   . Heart disease Father        CHF  . Thyroid disease Father        hypothyroidism  . Colon polyps Father   . Diabetes Sister        diet controlled  . Thyroid disease Sister   . Cancer Sister        breast   . Thyroid disease Sister   . Cancer Sister        breast  . Colon cancer Neg Hx   . Prostate cancer Neg Hx     SOCIAL HX: reviewed.    Current Outpatient Medications:  .  acetaminophen (TYLENOL) 500 MG tablet, Take 1,000 mg by mouth every 6 (six) hours as needed., Disp: , Rfl:  .  amLODipine (NORVASC) 2.5 MG tablet, TAKE 1 TABLET BY MOUTH EVERY DAY, Disp: 30 tablet, Rfl: 5 .  Aspirin 81 MG EC tablet, Take 81 mg by mouth daily., Disp: , Rfl:  .  citalopram (CELEXA) 20 MG tablet, TAKE 1 TABLET BY MOUTH EVERY DAY, Disp: 90 tablet, Rfl: 2 .  clopidogrel (PLAVIX) 75 MG tablet, TAKE 1 TABLET BY MOUTH EVERY DAY, Disp: 30 tablet, Rfl: 5 .  fexofenadine (ALLEGRA) 180 MG tablet, Take 180 mg by mouth daily. As needed, Disp: , Rfl:  .  levothyroxine (SYNTHROID, LEVOTHROID) 112 MCG tablet, TAKE 1 TABLET (112 MCG TOTAL) BY MOUTH DAILY BEFORE BREAKFAST., Disp: 90 tablet, Rfl: 1 .  lisinopril (PRINIVIL,ZESTRIL) 40 MG tablet, TAKE 1 TABLET BY MOUTH EVERY DAY, Disp: 30 tablet, Rfl: 5 .  LORazepam (ATIVAN) 1 MG tablet, Take one tablet q hs prn, Disp: 30 tablet, Rfl: 1 .  metoprolol tartrate (LOPRESSOR) 25 MG tablet, TAKE 1 TABLET BY MOUTH TWICE A DAY, Disp: 180 tablet, Rfl: 1 .  naproxen sodium (ANAPROX) 220 MG tablet, Take by mouth., Disp: , Rfl:  .  nitroGLYCERIN (NITROSTAT) 0.4 MG SL tablet, Place 0.4 mg under the tongue every 5 (five) minutes as needed. If a third tablet is needed please call 911., Disp: , Rfl:  .  pantoprazole (PROTONIX) 40 MG tablet, TAKE 1 TABLET (40 MG TOTAL) BY MOUTH DAILY., Disp: 30 tablet, Rfl: 8 .  rosuvastatin (CRESTOR) 20 MG tablet, TAKE 1 TABLET (20 MG TOTAL) BY MOUTH NIGHTLY., Disp: 30 tablet, Rfl: 8 .  triamcinolone (NASACORT) 55 MCG/ACT AERO nasal inhaler,  Place into the nose., Disp: , Rfl:  .  triamcinolone cream (KENALOG) 0.1 %, Apply 1 application topically 2 (two) times daily., Disp: 30 g, Rfl: 0 .  amoxicillin-clavulanate (AUGMENTIN) 875-125 MG tablet, Take 1 tablet by mouth 2 (two) times daily., Disp: 20 tablet, Rfl: 0 .  Probiotic Product (ALIGN) 4 MG CAPS, One tablet per  day, Disp: 30 capsule, Rfl: 0  EXAM:  GENERAL: alert.  Answering questions appropriately.  Sounds to be in no acute distress.    PSYCH/NEURO: pleasant and cooperative, no obvious depression or anxiety, speech and thought processing grossly intact  ASSESSMENT AND PLAN:  Discussed the following assessment and plan:  Dog bite, initial encounter  Dog bite Dog bite as outlined. He has cleaned the area.  Instructed to continue to keep clean.  Swelling has improved.  Described no redness.  Will treat with augmentin as directed.  Also discussed taking a probiotic while on abx and continuing for two weeks after completing the abx.  Will come in tomorrow for tetanus booster.  Will monitor closely.  Call or be evaluated if any worsening or change.  Dog is neighbors dog.  Up to date with vaccinations.      I discussed the assessment and treatment plan with the patient. The patient was provided an opportunity to ask questions and all were answered. The patient agreed with the plan and demonstrated an understanding of the instructions.   The patient was advised to call back or seek an in-person evaluation if the symptoms worsen or if the condition fails to improve as anticipated.  I provided 15 minutes of non-face-to-face time during this encounter.   Einar Pheasant, MD

## 2019-01-20 NOTE — Telephone Encounter (Signed)
OK glad too!! Want him to come in for nurse visit for tetanus?

## 2019-01-20 NOTE — Telephone Encounter (Signed)
Scheduled Nurse visit for 01/21/19.

## 2019-01-20 NOTE — Telephone Encounter (Signed)
There are 2 puncture wounds, one between the finger small finger and ring finger. Swollen and warm to the touch.  Dog bite occurred around  Lunch on 01/20/19,   Neighbors dog , small to medium breed dog , up to date on vaccines. Patient per chart had a DTAP ? In 2008.

## 2019-01-20 NOTE — Telephone Encounter (Signed)
Sounds like will need abx, etc.  Will need tetanus booster.  I can work him in for virtual visit.  Can put on schedule at 4:30 today or 12:30 today.

## 2019-01-20 NOTE — Telephone Encounter (Signed)
Will need tetanus.  He can come in for nurse visit as we discussed.

## 2019-01-20 NOTE — Telephone Encounter (Signed)
Copied from [place] [id number]. Topic: Appointment Scheduling - Scheduling Inquiry for Clinic >> Jan 20, 2019  8:51 AM Celene Kras A wrote: Reason for CRM: Pts wife states pt was bitten by a dog on his right hand 01/20/2019. She states there is a small puncture wound that she cleaned throughly, but there was still a lot of swelling and it is warm this morning. She states it bled a lot yesterday but there is no pain for him today. She is requesting advice on wether he needs a tetanus shot or to schedule a virtual visit. Please advise.

## 2019-01-21 ENCOUNTER — Encounter: Payer: Self-pay | Admitting: Internal Medicine

## 2019-01-21 ENCOUNTER — Other Ambulatory Visit: Payer: Self-pay

## 2019-01-21 ENCOUNTER — Ambulatory Visit (INDEPENDENT_AMBULATORY_CARE_PROVIDER_SITE_OTHER): Payer: Medicare Other

## 2019-01-21 DIAGNOSIS — Z23 Encounter for immunization: Secondary | ICD-10-CM | POA: Diagnosis not present

## 2019-01-21 DIAGNOSIS — W540XXA Bitten by dog, initial encounter: Secondary | ICD-10-CM | POA: Insufficient documentation

## 2019-01-21 NOTE — Assessment & Plan Note (Signed)
Dog bite as outlined. He has cleaned the area.  Instructed to continue to keep clean.  Swelling has improved.  Described no redness.  Will treat with augmentin as directed.  Also discussed taking a probiotic while on abx and continuing for two weeks after completing the abx.  Will come in tomorrow for tetanus booster.  Will monitor closely.  Call or be evaluated if any worsening or change.  Dog is neighbors dog.  Up to date with vaccinations.

## 2019-01-22 NOTE — Addendum Note (Signed)
Addended by: Nanci Pina on: 01/22/2019 01:41 PM   Modules accepted: Orders

## 2019-01-22 NOTE — Progress Notes (Addendum)
Patient came into office for nurse visit for Tetanus Vaccine on 01/21/19 , CMA was concerned about the appearance of patient hand after dog bite so had me take a look . Patient right hand had 2 puncture type wound was slightly red and swollen but could move finger good and no complaint of pain. Had PCP take look and order was given to use elevation to reduce swelling. Alied sling to right arm with adjustable straps. Blue VOG Sling W/Pad  Ref# [id number]  Reviewed above.  Pt evaluated while here to receive tetanus booster.  Right hand with soft tissue swelling.  Two puncture wounds.  Area clean.  Some minimal erythema - around puncture sites. No redness extending up the hand or wrist.  Pt reports swelling has improved.  Just started augmentin.  Discussed elevating hand.  Will place in sling to make it easier for him to keep his hand elevated.  Call with update.    Dr Nicki Reaper

## 2019-01-23 ENCOUNTER — Encounter (INDEPENDENT_AMBULATORY_CARE_PROVIDER_SITE_OTHER): Payer: Medicare Other

## 2019-01-23 ENCOUNTER — Telehealth: Payer: Self-pay | Admitting: Internal Medicine

## 2019-01-23 ENCOUNTER — Ambulatory Visit (INDEPENDENT_AMBULATORY_CARE_PROVIDER_SITE_OTHER): Payer: Medicare Other | Admitting: Vascular Surgery

## 2019-01-23 NOTE — Telephone Encounter (Signed)
Thank him for the update.  Continue the abx.  Keep [place] posted and let us know if he needs anything.

## 2019-01-23 NOTE — Telephone Encounter (Signed)
Copied from [place] [id number]. Topic: Quick Communication - See Telephone Encounter >> Jan 23, 2019 10:46 AM Rutherford Nail, NT wrote: CRM for notification. See Telephone encounter for: 01/23/19. Patient states that he was told to call back and let Dr Nicki Reaper or Juliann Pulse know how his hand was doing. States that the dog bite on his hand is doing well. States that the swelling has gone down and he is not having any issues with it.  CB#:[phone]

## 2019-01-23 NOTE — Telephone Encounter (Signed)
Patient aware.

## 2019-02-08 ENCOUNTER — Other Ambulatory Visit: Payer: Self-pay | Admitting: Internal Medicine

## 2019-02-25 ENCOUNTER — Other Ambulatory Visit: Payer: Self-pay

## 2019-02-25 ENCOUNTER — Telehealth: Payer: Self-pay | Admitting: *Deleted

## 2019-02-25 NOTE — Telephone Encounter (Signed)
Copied from [place] [id number]. Topic: General - Inquiry >> Feb 25, 2019  9:48 AM Mathis Bud wrote: Reason for CRM: patient would like to request another handicap parking paper work.  Patient states DMV lost.  Patient also states for PCP to sign it.  Patient call back # [phone] Patient address to mail: [address] [place], [place] [address]

## 2019-02-26 ENCOUNTER — Ambulatory Visit: Payer: Medicare Other | Admitting: Podiatry

## 2019-02-26 NOTE — Telephone Encounter (Signed)
Form placed in folder for you to sign

## 2019-02-27 NOTE — Telephone Encounter (Signed)
Signed and placed in box.   

## 2019-02-27 NOTE — Telephone Encounter (Signed)
Mailed to pt. Left detailed message for patient

## 2019-03-02 ENCOUNTER — Other Ambulatory Visit (INDEPENDENT_AMBULATORY_CARE_PROVIDER_SITE_OTHER): Payer: Self-pay | Admitting: Vascular Surgery

## 2019-03-02 DIAGNOSIS — I6523 Occlusion and stenosis of bilateral carotid arteries: Secondary | ICD-10-CM

## 2019-03-03 ENCOUNTER — Encounter (INDEPENDENT_AMBULATORY_CARE_PROVIDER_SITE_OTHER): Payer: Medicare Other

## 2019-03-03 ENCOUNTER — Ambulatory Visit (INDEPENDENT_AMBULATORY_CARE_PROVIDER_SITE_OTHER): Payer: Medicare Other | Admitting: Vascular Surgery

## 2019-03-12 ENCOUNTER — Other Ambulatory Visit: Payer: Self-pay | Admitting: Internal Medicine

## 2019-03-13 NOTE — Telephone Encounter (Signed)
Refilled: 01/01/2019 Last OV: 01/20/2019 Next OV: 05/08/2019

## 2019-04-13 ENCOUNTER — Ambulatory Visit (INDEPENDENT_AMBULATORY_CARE_PROVIDER_SITE_OTHER): Payer: Medicare Other | Admitting: Podiatry

## 2019-04-13 ENCOUNTER — Other Ambulatory Visit: Payer: Self-pay

## 2019-04-13 ENCOUNTER — Encounter: Payer: Self-pay | Admitting: Podiatry

## 2019-04-13 VITALS — Temp 97.9°F

## 2019-04-13 DIAGNOSIS — B351 Tinea unguium: Secondary | ICD-10-CM

## 2019-04-13 DIAGNOSIS — D689 Coagulation defect, unspecified: Secondary | ICD-10-CM

## 2019-04-13 DIAGNOSIS — M21619 Bunion of unspecified foot: Secondary | ICD-10-CM

## 2019-04-13 NOTE — Progress Notes (Signed)
Complaint:  Visit Type: Patient returns to my office for continued preventative foot care services. Complaint: Patient states" my nails have grown long and thick and become painful to walk and wear shoes"  The patient presents for preventative foot care services. No changes to ROS.  Patient is taking plavix.  Podiatric Exam: Vascular: dorsalis pedis and posterior tibial pulses are palpable bilateral. Capillary return is immediate. Temperature gradient is WNL. Skin turgor WNL  Sensorium: Diminished  Semmes Weinstein monofilament test. Normal tactile sensation bilaterally. Nail Exam: Pt has thick disfigured discolored nails with subungual debris noted bilateral entire nail hallux through fifth toenails Ulcer Exam: There is no evidence of ulcer or pre-ulcerative changes or infection. Orthopedic Exam: Muscle tone and strength are WNL. No limitations in general ROM. No crepitus or effusions noted. Foot type and digits show no abnormalities. HAV  B/L with hammer toes.. Skin: No Porokeratosis. No infection or ulcers  Diagnosis:  Onychomycosis, , Pain in right toe, pain in left toes  Treatment & Plan Procedures and Treatment: Consent by patient was obtained for treatment procedures. The patient understood the discussion of treatment and procedures well. All questions were answered thoroughly reviewed. Debridement of mycotic and hypertrophic toenails, 1 through 5 bilateral and clearing of subungual debris. No ulceration, no infection noted.  Return Visit-Office Procedure: Patient instructed to return to the office for a follow up visit 3 months for continued evaluation and treatment.    Gardiner Barefoot DPM

## 2019-04-14 ENCOUNTER — Ambulatory Visit (INDEPENDENT_AMBULATORY_CARE_PROVIDER_SITE_OTHER): Payer: Medicare Other | Admitting: Vascular Surgery

## 2019-04-14 ENCOUNTER — Encounter (INDEPENDENT_AMBULATORY_CARE_PROVIDER_SITE_OTHER): Payer: Medicare Other

## 2019-04-16 DIAGNOSIS — I255 Ischemic cardiomyopathy: Secondary | ICD-10-CM | POA: Diagnosis not present

## 2019-04-16 DIAGNOSIS — I6521 Occlusion and stenosis of right carotid artery: Secondary | ICD-10-CM | POA: Diagnosis not present

## 2019-04-16 DIAGNOSIS — I5022 Chronic systolic (congestive) heart failure: Secondary | ICD-10-CM | POA: Diagnosis not present

## 2019-04-16 DIAGNOSIS — I10 Essential (primary) hypertension: Secondary | ICD-10-CM | POA: Diagnosis not present

## 2019-04-16 DIAGNOSIS — I459 Conduction disorder, unspecified: Secondary | ICD-10-CM | POA: Diagnosis not present

## 2019-04-24 ENCOUNTER — Other Ambulatory Visit: Payer: Self-pay

## 2019-04-24 ENCOUNTER — Ambulatory Visit (INDEPENDENT_AMBULATORY_CARE_PROVIDER_SITE_OTHER): Payer: Medicare Other | Admitting: Nurse Practitioner

## 2019-04-24 ENCOUNTER — Encounter (INDEPENDENT_AMBULATORY_CARE_PROVIDER_SITE_OTHER): Payer: Self-pay | Admitting: Nurse Practitioner

## 2019-04-24 ENCOUNTER — Ambulatory Visit (INDEPENDENT_AMBULATORY_CARE_PROVIDER_SITE_OTHER): Payer: Medicare Other

## 2019-04-24 VITALS — BP 128/74 | HR 88 | Resp 14 | Ht 71.0 in | Wt 209.0 lb

## 2019-04-24 DIAGNOSIS — M170 Bilateral primary osteoarthritis of knee: Secondary | ICD-10-CM

## 2019-04-24 DIAGNOSIS — I6523 Occlusion and stenosis of bilateral carotid arteries: Secondary | ICD-10-CM

## 2019-04-24 DIAGNOSIS — E785 Hyperlipidemia, unspecified: Secondary | ICD-10-CM

## 2019-04-24 DIAGNOSIS — I10 Essential (primary) hypertension: Secondary | ICD-10-CM

## 2019-04-24 NOTE — Progress Notes (Signed)
SUBJECTIVE:  Patient ID: Daniel Austin, male    DOB: 12-05-1940, 78 y.o.   MRN: [id number] Chief Complaint  Patient presents with  . Follow-up    HPI  Daniel Austin is a 78 y.o. male The patient is seen for follow up evaluation of carotid stenosis. The carotid stenosis followed by ultrasound.   The patient denies amaurosis fugax. There is no recent history of TIA symptoms or focal motor deficits. There is no prior documented CVA.  The patient is taking enteric-coated aspirin 81 mg daily.  There is no history of migraine headaches. There is no history of seizures.  The patient has a history of coronary artery disease, no recent episodes of angina or shortness of breath. The patient denies PAD or claudication symptoms. There is a history of hyperlipidemia which is being treated with a statin.    Duplex ultrasound shows 1-39% stenosis bilaterally.  Patent right carotid stent. NO change compared to study 01/21/2018  Past Medical History:  Diagnosis Date  . Anemia   . Anxiety   . Arthritis   . CAD (coronary artery disease)   . Cardiomyopathy ([place])   . Carotid artery stenosis    high-grade symptomatic right sided s/p stent twice  . CHF (congestive heart failure) (HCC)    chronic movement fluid and history of CHF he noticed in the   . Depression   . Fractures, multiple    Metal rod in left foot  . GERD (gastroesophageal reflux disease)   . Heart block   . Hypercholesterolemia   . Hyperglycemia   . Hypertension   . Hypothyroidism   . Knee pain   . Presence of permanent cardiac pacemaker   . Shortness of breath dyspnea   . Stroke [facility])    clot in neck broke off left side weakness 2008  resolved  . Syncope    second degree heart block s/p St Jude's pacemaker placement 12/08 with his history is limited with any  . Ulcer of gastric fundus     Past Surgical History:  Procedure Laterality Date  . CARDIAC CATHETERIZATION    . CORONARY ANGIOPLASTY     stents x 3  .  heart stint  11/04/2014   [facility]  . HERNIA REPAIR     inguinal  . INSERT / REPLACE / REMOVE PACEMAKER    . JOINT REPLACEMENT     right knee  . KNEE ARTHROPLASTY Right 02/08/2016   Procedure: COMPUTER ASSISTED TOTAL KNEE ARTHROPLASTY;  Surgeon: Dereck Leep, MD;  Location: [facility] ORS;  Service: Orthopedics;  Laterality: Right;  . KNEE ARTHROPLASTY Left 06/27/2016   Procedure: COMPUTER ASSISTED TOTAL KNEE ARTHROPLASTY;  Surgeon: Dereck Leep, MD;  Location: [facility] ORS;  Service: Orthopedics;  Laterality: Left;  . PACEMAKER INSERTION  08/2007  . PACEMAKER INSERTION N/A 11/16/2015   Procedure: INSERTION PACEMAKER/ PACEMAKER CHANGE OUT;  Surgeon: Isaias Cowman, MD;  Location: [facility] ORS;  Service: Cardiovascular;  Laterality: N/A;  . PERCUTANEOUS PLACEMENT INTRAVASCULAR STENT CERVICAL CAROTID ARTERY     twice, right carotid artery    Social History   Socioeconomic History  . Marital status: Married    Spouse name: Not on file  . Number of children: 1  . Years of education: Not on file  . Highest education level: Not on file  Occupational History  . Not on file  Social Needs  . Financial resource strain: Not hard at all  . Food insecurity    Worry: Never true  Inability: Never true  . Transportation needs    Medical: No    Non-medical: No  Tobacco Use  . Smoking status: Never Smoker  . Smokeless tobacco: Never Used  Substance and Sexual Activity  . Alcohol use: No    Alcohol/week: 0.0 standard drinks  . Drug use: No  . Sexual activity: Not Currently  Lifestyle  . Physical activity    Days per week: 0 days    Minutes per session: Not on file  . Stress: Not at all  Relationships  . Social Herbalist on phone: Patient refused    Gets together: Patient refused    Attends religious service: Patient refused    Active member of club or organization: Patient refused    Attends meetings of clubs or organizations: Patient refused    Relationship status:  Patient refused  . Intimate partner violence    Fear of current or ex partner: No    Emotionally abused: No    Physically abused: No    Forced sexual activity: No  Other Topics Concern  . Not on file  Social History Narrative  . Not on file    Family History  Problem Relation Age of Onset  . Heart disease Mother        CHF  . Heart disease Father        CHF  . Thyroid disease Father        hypothyroidism  . Colon polyps Father   . Diabetes Sister        diet controlled  . Thyroid disease Sister   . Cancer Sister        breast   . Thyroid disease Sister   . Cancer Sister        breast  . Colon cancer Neg Hx   . Prostate cancer Neg Hx     Allergies  Allergen Reactions  . Beta Adrenergic Blockers Other (See Comments)    Second Degree Heart Block  . Lipitor [Atorvastatin] Other (See Comments)    Myalgia Other Reaction: myalgia Myalgia      Review of Systems   Review of Systems: Negative Unless Checked Constitutional: [] Weight loss  [] Fever  [] Chills Cardiac: [] Chest pain   []  Atrial Fibrillation  [] Palpitations   [] Shortness of breath when laying flat   [] Shortness of breath with exertion. [] Shortness of breath at rest Vascular:  [] Pain in legs with walking   [] Pain in legs with standing [] Pain in legs when laying flat   [] Claudication    [] Pain in feet when laying flat    [] History of DVT   [] Phlebitis   [] Swelling in legs   [] Varicose veins   [] Non-healing ulcers Pulmonary:   [] Uses home oxygen   [] Productive cough   [] Hemoptysis   [] Wheeze  [] COPD   [] Asthma Neurologic:  [] Dizziness   [] Seizures  [] Blackouts [] History of stroke   [] History of TIA  [] Aphasia   [] Temporary Blindness   [] Weakness or numbness in arm   [] Weakness or numbness in leg Musculoskeletal:   [] Joint swelling   [] Joint pain   [] Low back pain  [x]  History of Knee Replacement [x] Arthritis [] back Surgeries  []  Spinal Stenosis    Hematologic:  [] Easy bruising  [] Easy bleeding   [] Hypercoagulable  state   [x] Anemic Gastrointestinal:  [] Diarrhea   [] Vomiting  [] Gastroesophageal reflux/heartburn   [] Difficulty swallowing. [] Abdominal pain Genitourinary:  [] Chronic kidney disease   [] Difficult urination  [] Anuric   [] Blood in urine [] Frequent urination  []   Burning with urination   [] Hematuria Skin:  [] Rashes   [] Ulcers [] Wounds Psychological:  [x] History of anxiety   [x]  History of major depression  []  Memory Difficulties      OBJECTIVE:   Physical Exam  BP 128/74 (BP Location: Left Arm, Patient Position: Sitting, Cuff Size: Normal)   Pulse 88   Resp 14   Ht 5\' 11"  (1.803 m)   Wt 209 lb (94.8 kg)   BMI 29.15 kg/m   Gen: WD/WN, NAD Head: [place]/AT, No temporalis wasting.  Ear/Nose/Throat: Hearing grossly intact, nares w/o erythema or drainage Eyes: PER, EOMI, sclera nonicteric.  Neck: Supple, no masses.  No JVD.  Pulmonary:  Good air movement, no use of accessory muscles.  Cardiac: RRR Vascular:  Vessel Right Left  Radial Palpable Palpable   Gastrointestinal: soft, non-distended. No guarding/no peritoneal signs.  Musculoskeletal: M/S 5/5 throughout.  No deformity or atrophy.  Neurologic: Pain and light touch intact in extremities.  Symmetrical.  Speech is fluent. Motor exam as listed above. Psychiatric: Judgment intact, Mood & affect appropriate for pt's clinical situation. Dermatologic: No Venous rashes. No Ulcers Noted.  No changes consistent with cellulitis. Lymph : No Cervical lymphadenopathy, no lichenification or skin changes of chronic lymphedema.       ASSESSMENT AND PLAN:  1. Bilateral carotid artery stenosis Recommend:  Given the patient's asymptomatic subcritical stenosis no further invasive testing or surgery at this time.  Duplex ultrasound shows 1-39% stenosis bilaterally.  Patent right carotid stent  Continue antiplatelet therapy as prescribed Continue management of CAD, HTN and Hyperlipidemia Healthy heart diet,  encouraged exercise at least 4 times per  week Follow up in 12 months with duplex ultrasound and physical exam   2. Primary osteoarthritis of both knees Continue NSAID medications as already ordered, these medications have been reviewed and there are no changes at this time.  Continued activity and therapy was stressed.   3. Hyperlipidemia, unspecified hyperlipidemia type Continue statin as ordered and reviewed, no changes at this time   4. Essential hypertension, benign Continue antihypertensive medications as already ordered, these medications have been reviewed and there are no changes at this time.    Current Outpatient Medications on File Prior to Visit  Medication Sig Dispense Refill  . acetaminophen (TYLENOL) 500 MG tablet Take 1,000 mg by mouth every 6 (six) hours as needed.    Marland Kitchen amLODipine (NORVASC) 2.5 MG tablet TAKE 1 TABLET BY MOUTH EVERY DAY 90 tablet 1  . amoxicillin-clavulanate (AUGMENTIN) 875-125 MG tablet Take 1 tablet by mouth 2 (two) times daily. 20 tablet 0  . Aspirin 81 MG EC tablet Take 81 mg by mouth daily.    . citalopram (CELEXA) 20 MG tablet TAKE 1 TABLET BY MOUTH EVERY DAY 90 tablet 2  . clopidogrel (PLAVIX) 75 MG tablet TAKE 1 TABLET BY MOUTH EVERY DAY 30 tablet 5  . fexofenadine (ALLEGRA) 180 MG tablet Take 180 mg by mouth daily. As needed    . levothyroxine (SYNTHROID, LEVOTHROID) 112 MCG tablet TAKE 1 TABLET (112 MCG TOTAL) BY MOUTH DAILY BEFORE BREAKFAST. 90 tablet 1  . lisinopril (PRINIVIL,ZESTRIL) 40 MG tablet TAKE 1 TABLET BY MOUTH EVERY DAY 30 tablet 5  . LORazepam (ATIVAN) 1 MG tablet TAKE ONE TABLET AT BEDTIME AS NEEDED 30 tablet 1  . metoprolol tartrate (LOPRESSOR) 25 MG tablet TAKE 1 TABLET BY MOUTH TWICE A DAY 180 tablet 1  . naproxen sodium (ANAPROX) 220 MG tablet Take by mouth.    . nitroGLYCERIN (NITROSTAT) 0.4  MG SL tablet Place 0.4 mg under the tongue every 5 (five) minutes as needed. If a third tablet is needed please call 911.    . pantoprazole (PROTONIX) 40 MG tablet TAKE 1  TABLET (40 MG TOTAL) BY MOUTH DAILY. 30 tablet 8  . Probiotic Product (ALIGN) 4 MG CAPS One tablet per day 30 capsule 0  . rosuvastatin (CRESTOR) 20 MG tablet TAKE 1 TABLET (20 MG TOTAL) BY MOUTH NIGHTLY. 30 tablet 8  . triamcinolone (NASACORT) 55 MCG/ACT AERO nasal inhaler Place into the nose.    . triamcinolone cream (KENALOG) 0.1 % Apply 1 application topically 2 (two) times daily. 30 g 0   No current facility-administered medications on file prior to visit.     There are no Patient Instructions on file for this visit. Return in about 1 year (around 04/23/2020) for Carotid stenosis.   Kris Hartmann, NP  This note was completed with Sales executive.  Any errors are purely unintentional.

## 2019-04-30 ENCOUNTER — Other Ambulatory Visit: Payer: Self-pay | Admitting: Internal Medicine

## 2019-05-01 ENCOUNTER — Other Ambulatory Visit: Payer: Self-pay | Admitting: Internal Medicine

## 2019-05-05 ENCOUNTER — Other Ambulatory Visit (INDEPENDENT_AMBULATORY_CARE_PROVIDER_SITE_OTHER): Payer: Medicare Other

## 2019-05-05 ENCOUNTER — Other Ambulatory Visit: Payer: Self-pay

## 2019-05-05 DIAGNOSIS — R739 Hyperglycemia, unspecified: Secondary | ICD-10-CM

## 2019-05-05 DIAGNOSIS — D649 Anemia, unspecified: Secondary | ICD-10-CM | POA: Diagnosis not present

## 2019-05-05 DIAGNOSIS — E785 Hyperlipidemia, unspecified: Secondary | ICD-10-CM | POA: Diagnosis not present

## 2019-05-05 DIAGNOSIS — I10 Essential (primary) hypertension: Secondary | ICD-10-CM

## 2019-05-05 LAB — HEPATIC FUNCTION PANEL
ALT: 9 U/L (ref 0–53)
AST: 15 U/L (ref 0–37)
Albumin: 4.6 g/dL (ref 3.5–5.2)
Alkaline Phosphatase: 83 U/L (ref 39–117)
Bilirubin, Direct: 0.2 mg/dL (ref 0.0–0.3)
Total Bilirubin: 0.8 mg/dL (ref 0.2–1.2)
Total Protein: 6.8 g/dL (ref 6.0–8.3)

## 2019-05-05 LAB — BASIC METABOLIC PANEL
BUN: 7 mg/dL (ref 6–23)
CO2: 26 mEq/L (ref 19–32)
Calcium: 9.6 mg/dL (ref 8.4–10.5)
Chloride: 96 mEq/L (ref 96–112)
Creatinine, Ser: 0.87 mg/dL (ref 0.40–1.50)
GFR: 84.66 mL/min (ref >60.00)
Glucose, Bld: 98 mg/dL (ref 70–99)
Potassium: 4.6 mEq/L (ref 3.5–5.1)
Sodium: 128 mEq/L — ABNORMAL LOW (ref 135–145)

## 2019-05-05 LAB — CBC WITH DIFFERENTIAL/PLATELET
Basophils Absolute: 0.1 10*3/uL (ref 0.0–0.1)
Basophils Relative: 1.8 % (ref 0.0–3.0)
Eosinophils Absolute: 0.3 10*3/uL (ref 0.0–0.7)
Eosinophils Relative: 7.6 % — ABNORMAL HIGH (ref 0.0–5.0)
HCT: 38 % — ABNORMAL LOW (ref 39.0–52.0)
Hemoglobin: 12.6 g/dL — ABNORMAL LOW (ref 13.0–17.0)
Lymphocytes Relative: 21.7 % (ref 12.0–46.0)
Lymphs Abs: 1 10*3/uL (ref 0.7–4.0)
MCHC: 33.2 g/dL (ref 30.0–36.0)
MCV: 89.6 fl (ref 78.0–100.0)
Monocytes Absolute: 0.5 10*3/uL (ref 0.1–1.0)
Monocytes Relative: 11.3 % (ref 3.0–12.0)
Neutro Abs: 2.6 10*3/uL (ref 1.4–7.7)
Neutrophils Relative %: 57.6 % (ref 43.0–77.0)
Platelets: 237 10*3/uL (ref 150.0–400.0)
RBC: 4.24 Mil/uL (ref 4.22–5.81)
RDW: 13.8 % (ref 11.5–15.5)
WBC: 4.5 10*3/uL (ref 4.0–10.5)

## 2019-05-05 LAB — HEMOGLOBIN A1C: Hgb A1c MFr Bld: 6.1 % (ref 4.6–6.5)

## 2019-05-05 LAB — LIPID PANEL
Cholesterol: 130 mg/dL (ref 0–200)
HDL: 53.5 mg/dL
LDL Cholesterol: 57 mg/dL (ref 0–99)
NonHDL: 76.82
Total CHOL/HDL Ratio: 2
Triglycerides: 100 mg/dL (ref 0.0–149.0)
VLDL: 20 mg/dL (ref 0.0–40.0)

## 2019-05-06 ENCOUNTER — Telehealth: Payer: Self-pay

## 2019-05-06 ENCOUNTER — Other Ambulatory Visit: Payer: Self-pay

## 2019-05-06 ENCOUNTER — Telehealth: Payer: Self-pay | Admitting: *Deleted

## 2019-05-06 ENCOUNTER — Other Ambulatory Visit (INDEPENDENT_AMBULATORY_CARE_PROVIDER_SITE_OTHER): Payer: Medicare Other

## 2019-05-06 ENCOUNTER — Other Ambulatory Visit: Payer: Self-pay | Admitting: Internal Medicine

## 2019-05-06 DIAGNOSIS — E871 Hypo-osmolality and hyponatremia: Secondary | ICD-10-CM

## 2019-05-06 LAB — SODIUM: Sodium: 130 mEq/L — ABNORMAL LOW (ref 135–145)

## 2019-05-06 NOTE — Telephone Encounter (Signed)
Pt was added to lab schedule late yesterday after I had already checked it.  Please place future lab orders

## 2019-05-06 NOTE — Telephone Encounter (Signed)
Already addressed

## 2019-05-06 NOTE — Telephone Encounter (Signed)
Future lab order placed for f/u sodium

## 2019-05-08 ENCOUNTER — Other Ambulatory Visit: Payer: Self-pay

## 2019-05-08 ENCOUNTER — Ambulatory Visit (INDEPENDENT_AMBULATORY_CARE_PROVIDER_SITE_OTHER): Payer: Medicare Other | Admitting: Internal Medicine

## 2019-05-08 VITALS — BP 102/60 | HR 95 | Temp 97.5°F | Resp 16 | Wt 208.0 lb

## 2019-05-08 DIAGNOSIS — R739 Hyperglycemia, unspecified: Secondary | ICD-10-CM

## 2019-05-08 DIAGNOSIS — D649 Anemia, unspecified: Secondary | ICD-10-CM | POA: Diagnosis not present

## 2019-05-08 DIAGNOSIS — I2510 Atherosclerotic heart disease of native coronary artery without angina pectoris: Secondary | ICD-10-CM

## 2019-05-08 DIAGNOSIS — Z0000 Encounter for general adult medical examination without abnormal findings: Secondary | ICD-10-CM

## 2019-05-08 DIAGNOSIS — I10 Essential (primary) hypertension: Secondary | ICD-10-CM | POA: Diagnosis not present

## 2019-05-08 DIAGNOSIS — E039 Hypothyroidism, unspecified: Secondary | ICD-10-CM | POA: Diagnosis not present

## 2019-05-08 DIAGNOSIS — I6523 Occlusion and stenosis of bilateral carotid arteries: Secondary | ICD-10-CM

## 2019-05-08 DIAGNOSIS — E871 Hypo-osmolality and hyponatremia: Secondary | ICD-10-CM

## 2019-05-08 NOTE — Progress Notes (Signed)
Patient ID: Daniel Austin, male   DOB: [birth year], 78 y.o.   MRN: [id number]   Subjective:    Patient ID: Daniel Austin, male    DOB: 07/03/1941, 78 y.o.   MRN: [id number]  HPI  Patient here for his physical exam.  States he is doing well.  Trying to stay in due to covid restrictions.  No fever. No cough or chest congestion.  No acid reflux.  No chest pain or sob.  No acid reflux.  No abdominal pain.  Bowels moving.  Handling stress.  Knees doing well.  Saw cardiology 03/2019.  Stable.  Recommended f/u in 6 moths  Discussed labs.  Recent decreased sodium.  Recheck improved.  Has not been sick.     Past Medical History:  Diagnosis Date  . Anemia   . Anxiety   . Arthritis   . CAD (coronary artery disease)   . Cardiomyopathy ([place])   . Carotid artery stenosis    high-grade symptomatic right sided s/p stent twice  . CHF (congestive heart failure) (HCC)    chronic movement fluid and history of CHF he noticed in the   . Depression   . Fractures, multiple    Metal rod in left foot  . GERD (gastroesophageal reflux disease)   . Heart block   . Hypercholesterolemia   . Hyperglycemia   . Hypertension   . Hypothyroidism   . Knee pain   . Presence of permanent cardiac pacemaker   . Shortness of breath dyspnea   . Stroke [facility])    clot in neck broke off left side weakness 2008  resolved  . Syncope    second degree heart block s/p St Jude's pacemaker placement 12/08 with his history is limited with any  . Ulcer of gastric fundus    Past Surgical History:  Procedure Laterality Date  . CARDIAC CATHETERIZATION    . CORONARY ANGIOPLASTY     stents x 3  . heart stint  11/04/2014   [facility]  . HERNIA REPAIR     inguinal  . INSERT / REPLACE / REMOVE PACEMAKER    . JOINT REPLACEMENT     right knee  . KNEE ARTHROPLASTY Right 02/08/2016   Procedure: COMPUTER ASSISTED TOTAL KNEE ARTHROPLASTY;  Surgeon: Dereck Leep, MD;  Location: [facility] ORS;  Service: Orthopedics;  Laterality: Right;   . KNEE ARTHROPLASTY Left 06/27/2016   Procedure: COMPUTER ASSISTED TOTAL KNEE ARTHROPLASTY;  Surgeon: Dereck Leep, MD;  Location: [facility] ORS;  Service: Orthopedics;  Laterality: Left;  . PACEMAKER INSERTION  08/2007  . PACEMAKER INSERTION N/A 11/16/2015   Procedure: INSERTION PACEMAKER/ PACEMAKER CHANGE OUT;  Surgeon: Isaias Cowman, MD;  Location: [facility] ORS;  Service: Cardiovascular;  Laterality: N/A;  . PERCUTANEOUS PLACEMENT INTRAVASCULAR STENT CERVICAL CAROTID ARTERY     twice, right carotid artery   Family History  Problem Relation Age of Onset  . Heart disease Mother        CHF  . Heart disease Father        CHF  . Thyroid disease Father        hypothyroidism  . Colon polyps Father   . Diabetes Sister        diet controlled  . Thyroid disease Sister   . Cancer Sister        breast   . Thyroid disease Sister   . Cancer Sister        breast  . Colon cancer Neg Hx   .  Prostate cancer Neg Hx    Social History   Socioeconomic History  . Marital status: Married    Spouse name: Not on file  . Number of children: 1  . Years of education: Not on file  . Highest education level: Not on file  Occupational History  . Not on file  Social Needs  . Financial resource strain: Not hard at all  . Food insecurity    Worry: Never true    Inability: Never true  . Transportation needs    Medical: No    Non-medical: No  Tobacco Use  . Smoking status: Never Smoker  . Smokeless tobacco: Never Used  Substance and Sexual Activity  . Alcohol use: No    Alcohol/week: 0.0 standard drinks  . Drug use: No  . Sexual activity: Not Currently  Lifestyle  . Physical activity    Days per week: 0 days    Minutes per session: Not on file  . Stress: Not at all  Relationships  . Social Herbalist on phone: Patient refused    Gets together: Patient refused    Attends religious service: Patient refused    Active member of club or organization: Patient refused    Attends  meetings of clubs or organizations: Patient refused    Relationship status: Patient refused  Other Topics Concern  . Not on file  Social History Narrative  . Not on file    Outpatient Encounter Medications as of 05/08/2019  Medication Sig  . acetaminophen (TYLENOL) 500 MG tablet Take 1,000 mg by mouth every 6 (six) hours as needed.  Marland Kitchen amLODipine (NORVASC) 2.5 MG tablet TAKE 1 TABLET BY MOUTH EVERY DAY  . Aspirin 81 MG EC tablet Take 81 mg by mouth daily.  . citalopram (CELEXA) 20 MG tablet TAKE 1 TABLET BY MOUTH EVERY DAY  . clopidogrel (PLAVIX) 75 MG tablet TAKE 1 TABLET BY MOUTH EVERY DAY  . fexofenadine (ALLEGRA) 180 MG tablet Take 180 mg by mouth daily. As needed  . levothyroxine (SYNTHROID) 112 MCG tablet TAKE 1 TABLET (112 MCG TOTAL) BY MOUTH DAILY BEFORE BREAKFAST.  Marland Kitchen lisinopril (PRINIVIL,ZESTRIL) 40 MG tablet TAKE 1 TABLET BY MOUTH EVERY DAY  . LORazepam (ATIVAN) 1 MG tablet TAKE ONE TABLET AT BEDTIME AS NEEDED  . metoprolol tartrate (LOPRESSOR) 25 MG tablet TAKE 1 TABLET BY MOUTH TWICE A DAY  . naproxen sodium (ANAPROX) 220 MG tablet Take by mouth.  . nitroGLYCERIN (NITROSTAT) 0.4 MG SL tablet Place 0.4 mg under the tongue every 5 (five) minutes as needed. If a third tablet is needed please call 911.  . pantoprazole (PROTONIX) 40 MG tablet TAKE 1 TABLET (40 MG TOTAL) BY MOUTH DAILY.  . Probiotic Product (ALIGN) 4 MG CAPS One tablet per day  . rosuvastatin (CRESTOR) 20 MG tablet TAKE 1 TABLET (20 MG TOTAL) BY MOUTH NIGHTLY.  . triamcinolone (NASACORT) 55 MCG/ACT AERO nasal inhaler Place into the nose.  . triamcinolone cream (KENALOG) 0.1 % Apply 1 application topically 2 (two) times daily.  . [DISCONTINUED] amoxicillin-clavulanate (AUGMENTIN) 875-125 MG tablet Take 1 tablet by mouth 2 (two) times daily.  . [DISCONTINUED] LORazepam (ATIVAN) 1 MG tablet TAKE ONE TABLET AT BEDTIME AS NEEDED   No facility-administered encounter medications on file as of 05/08/2019.     Review of  Systems  Constitutional: Negative for appetite change and unexpected weight change.  HENT: Negative for congestion and sinus pressure.   Eyes: Negative for pain and visual disturbance.  Respiratory: Negative for cough, chest tightness and shortness of breath.   Cardiovascular: Negative for chest pain, palpitations and leg swelling.  Gastrointestinal: Negative for abdominal pain, diarrhea, nausea and vomiting.  Genitourinary: Negative for difficulty urinating and dysuria.  Musculoskeletal: Negative for joint swelling and myalgias.  Skin: Negative for color change and rash.  Neurological: Negative for dizziness, light-headedness and headaches.  Hematological: Negative for adenopathy. Does not bruise/bleed easily.  Psychiatric/Behavioral: Negative for agitation and dysphoric mood.       Objective:    Physical Exam Constitutional:      General: He is not in acute distress.    Appearance: Normal appearance. He is well-developed.  HENT:     Head: Normocephalic and atraumatic.     Right Ear: External ear normal.     Left Ear: External ear normal.     Mouth/Throat:     Pharynx: No oropharyngeal exudate.  Eyes:     General: No scleral icterus.       Right eye: No discharge.        Left eye: No discharge.     Conjunctiva/sclera: Conjunctivae normal.  Neck:     Musculoskeletal: Neck supple. No muscular tenderness.     Thyroid: No thyromegaly.  Cardiovascular:     Rate and Rhythm: Normal rate and regular rhythm.  Pulmonary:     Effort: No respiratory distress.     Breath sounds: Normal breath sounds. No wheezing.  Abdominal:     General: Bowel sounds are normal.     Palpations: Abdomen is soft.     Tenderness: There is no abdominal tenderness.  Genitourinary:    Comments: Not performed.  Musculoskeletal:        General: No tenderness.  Lymphadenopathy:     Cervical: No cervical adenopathy.  Skin:    Findings: No erythema or rash.  Neurological:     Mental Status: He is alert  and oriented to person, place, and time.  Psychiatric:        Mood and Affect: Mood normal.        Behavior: Behavior normal.     BP 102/60   Pulse 95   Temp (!) 97.5 F (36.4 C) (Temporal)   Resp 16   Wt 208 lb (94.3 kg)   SpO2 97%   BMI 29.01 kg/m  Wt Readings from Last 3 Encounters:  05/08/19 208 lb (94.3 kg)  04/24/19 209 lb (94.8 kg)  01/01/19 211 lb (95.7 kg)     Lab Results  Component Value Date   WBC 4.5 05/05/2019   HGB 12.6 (L) 05/05/2019   HCT 38.0 (L) 05/05/2019   PLT 237.0 05/05/2019   GLUCOSE 98 05/05/2019   CHOL 130 05/05/2019   TRIG 100.0 05/05/2019   HDL 53.50 05/05/2019   LDLCALC 57 05/05/2019   ALT 9 05/05/2019   AST 15 05/05/2019   NA 130 (L) 05/06/2019   K 4.6 05/05/2019   CL 96 05/05/2019   CREATININE 0.87 05/05/2019   BUN 7 05/05/2019   CO2 26 05/05/2019   TSH 2.26 08/26/2018   PSA 0.41 04/18/2018   INR 1.13 06/11/2016   HGBA1C 6.1 05/05/2019   MICROALBUR 1.6 05/25/2014       Assessment & Plan:   Problem List Items Addressed This Visit    Adult hypothyroidism    On thyroid replacement.  Follow tsh.       Anemia    Follow cbc.  Has had previous GI w/up.  Relevant Orders   CBC with Differential/Platelet   IBC + Ferritin   Benign essential HTN    Blood pressure under good control.  Continue same medication regimen.  Follow pressures.  Follow metabolic panel.        Relevant Orders   TSH   Carotid artery stenosis    Followed by [facility].  Just evaluated 03/2019.  Stable.  Recommended f/u in one year.        Coronary atherosclerosis of native coronary artery    Followed by cardiology.  Stable.  Continue risk factor modification.        Health care maintenance    Physical today 05/08/19.  Colonoscopy 2012.  Follow psa.       Hyperglycemia    Low carb diet and exercise.  Follow met b and a1c.       Hyponatremia    Sodium 128 on recent check.  Decreased some free water.  Recheck 130.  Feels good.  Follow.  Recheck  sodium soon to confirm stable.       Relevant Orders   Sodium       Einar Pheasant, MD

## 2019-05-09 ENCOUNTER — Other Ambulatory Visit: Payer: Self-pay | Admitting: Internal Medicine

## 2019-05-10 ENCOUNTER — Encounter: Payer: Self-pay | Admitting: Internal Medicine

## 2019-05-10 DIAGNOSIS — E871 Hypo-osmolality and hyponatremia: Secondary | ICD-10-CM | POA: Insufficient documentation

## 2019-05-10 MED ORDER — LORAZEPAM 1 MG PO TABS: ORAL_TABLET | ORAL | 1 refills | Status: DC

## 2019-05-10 NOTE — Assessment & Plan Note (Signed)
Physical today 05/08/19.  Colonoscopy 2012.  Follow psa.

## 2019-05-10 NOTE — Assessment & Plan Note (Signed)
Low carb diet and exercise.  Follow met b and a1c.  

## 2019-05-10 NOTE — Assessment & Plan Note (Signed)
Followed by cardiology.  Stable.  Continue risk factor modification.   

## 2019-05-10 NOTE — Assessment & Plan Note (Signed)
Sodium 128 on recent check.  Decreased some free water.  Recheck 130.  Feels good.  Follow.  Recheck sodium soon to confirm stable.

## 2019-05-10 NOTE — Assessment & Plan Note (Signed)
Follow cbc.  Has had previous GI w/up.

## 2019-05-10 NOTE — Assessment & Plan Note (Signed)
Blood pressure under good control.  Continue same medication regimen.  Follow pressures.  Follow metabolic panel.   

## 2019-05-10 NOTE — Assessment & Plan Note (Signed)
Followed by [facility].  Just evaluated 03/2019.  Stable.  Recommended f/u in one year.

## 2019-05-10 NOTE — Assessment & Plan Note (Signed)
On thyroid replacement.  Follow tsh.  

## 2019-05-18 ENCOUNTER — Other Ambulatory Visit: Payer: Self-pay

## 2019-05-18 ENCOUNTER — Other Ambulatory Visit (INDEPENDENT_AMBULATORY_CARE_PROVIDER_SITE_OTHER): Payer: Medicare Other

## 2019-05-18 DIAGNOSIS — I10 Essential (primary) hypertension: Secondary | ICD-10-CM

## 2019-05-18 DIAGNOSIS — E871 Hypo-osmolality and hyponatremia: Secondary | ICD-10-CM | POA: Diagnosis not present

## 2019-05-18 DIAGNOSIS — D649 Anemia, unspecified: Secondary | ICD-10-CM | POA: Diagnosis not present

## 2019-05-18 LAB — CBC WITH DIFFERENTIAL/PLATELET
Basophils Absolute: 0.1 10*3/uL (ref 0.0–0.1)
Basophils Relative: 1.7 % (ref 0.0–3.0)
Eosinophils Absolute: 0.4 10*3/uL (ref 0.0–0.7)
Eosinophils Relative: 9.6 % — ABNORMAL HIGH (ref 0.0–5.0)
HCT: 36.7 % — ABNORMAL LOW (ref 39.0–52.0)
Hemoglobin: 12.3 g/dL — ABNORMAL LOW (ref 13.0–17.0)
Lymphocytes Relative: 26.1 % (ref 12.0–46.0)
Lymphs Abs: 1.1 10*3/uL (ref 0.7–4.0)
MCHC: 33.5 g/dL (ref 30.0–36.0)
MCV: 89 fl (ref 78.0–100.0)
Monocytes Absolute: 0.5 10*3/uL (ref 0.1–1.0)
Monocytes Relative: 10.8 % (ref 3.0–12.0)
Neutro Abs: 2.2 10*3/uL (ref 1.4–7.7)
Neutrophils Relative %: 51.8 % (ref 43.0–77.0)
Platelets: 259 10*3/uL (ref 150.0–400.0)
RBC: 4.13 Mil/uL — ABNORMAL LOW (ref 4.22–5.81)
RDW: 13.6 % (ref 11.5–15.5)
WBC: 4.2 10*3/uL (ref 4.0–10.5)

## 2019-05-18 LAB — SODIUM: Sodium: 129 mEq/L — ABNORMAL LOW (ref 135–145)

## 2019-05-19 LAB — IBC + FERRITIN
Ferritin: 153.2 ng/mL (ref 22.0–322.0)
Iron: 98 ug/dL (ref 42–165)
Saturation Ratios: 35.2 % (ref 20.0–50.0)
Transferrin: 199 mg/dL — ABNORMAL LOW (ref 212.0–360.0)

## 2019-05-19 LAB — TSH: TSH: 1.64 u[IU]/mL (ref 0.35–4.50)

## 2019-05-20 ENCOUNTER — Other Ambulatory Visit: Payer: Self-pay | Admitting: Internal Medicine

## 2019-05-20 DIAGNOSIS — E871 Hypo-osmolality and hyponatremia: Secondary | ICD-10-CM

## 2019-05-20 NOTE — Progress Notes (Signed)
Orders placed for f/u labs/urine

## 2019-05-27 ENCOUNTER — Other Ambulatory Visit: Payer: Self-pay | Admitting: Internal Medicine

## 2019-05-28 ENCOUNTER — Other Ambulatory Visit: Payer: Self-pay

## 2019-05-28 ENCOUNTER — Other Ambulatory Visit (INDEPENDENT_AMBULATORY_CARE_PROVIDER_SITE_OTHER): Payer: Medicare Other

## 2019-05-28 DIAGNOSIS — E871 Hypo-osmolality and hyponatremia: Secondary | ICD-10-CM

## 2019-05-28 LAB — BASIC METABOLIC PANEL
BUN: 9 mg/dL (ref 6–23)
CO2: 24 mEq/L (ref 19–32)
Calcium: 9.3 mg/dL (ref 8.4–10.5)
Chloride: 97 mEq/L (ref 96–112)
Creatinine, Ser: 0.89 mg/dL (ref 0.40–1.50)
GFR: 82.45 mL/min (ref >60.00)
Glucose, Bld: 97 mg/dL (ref 70–99)
Potassium: 4.5 mEq/L (ref 3.5–5.1)
Sodium: 131 mEq/L — ABNORMAL LOW (ref 135–145)

## 2019-05-30 LAB — OSMOLALITY, URINE: Osmolality, Ur: 179 mOsm/kg (ref 50–1200)

## 2019-05-30 LAB — SODIUM, URINE, RANDOM: Sodium, Ur: 32 mmol/L (ref 28–272)

## 2019-05-30 LAB — OSMOLALITY: Osmolality: 275 mOsm/kg — ABNORMAL LOW (ref 278–305)

## 2019-07-10 ENCOUNTER — Other Ambulatory Visit: Payer: Self-pay | Admitting: Internal Medicine

## 2019-07-13 ENCOUNTER — Other Ambulatory Visit: Payer: Self-pay

## 2019-07-13 ENCOUNTER — Ambulatory Visit: Payer: Medicare Other | Admitting: Podiatry

## 2019-07-13 ENCOUNTER — Encounter: Payer: Self-pay | Admitting: Podiatry

## 2019-07-13 DIAGNOSIS — B351 Tinea unguium: Secondary | ICD-10-CM | POA: Diagnosis not present

## 2019-07-13 DIAGNOSIS — M21619 Bunion of unspecified foot: Secondary | ICD-10-CM | POA: Diagnosis not present

## 2019-07-13 DIAGNOSIS — D689 Coagulation defect, unspecified: Secondary | ICD-10-CM

## 2019-07-13 NOTE — Progress Notes (Signed)
Complaint:  Visit Type: Patient returns to my office for continued preventative foot care services. Complaint: Patient states" my nails have grown long and thick and become painful to walk and wear shoes"  The patient presents for preventative foot care services. No changes to ROS.  Patient is taking plavix.  Podiatric Exam: Vascular: dorsalis pedis and posterior tibial pulses are palpable bilateral. Capillary return is immediate. Temperature gradient is WNL. Skin turgor WNL  Sensorium: Diminished  Semmes Weinstein monofilament test. Normal tactile sensation bilaterally. Nail Exam: Pt has thick disfigured discolored nails with subungual debris noted bilateral entire nail hallux through fifth toenails Ulcer Exam: There is no evidence of ulcer or pre-ulcerative changes or infection. Orthopedic Exam: Muscle tone and strength are WNL. No limitations in general ROM. No crepitus or effusions noted. Foot type and digits show no abnormalities. HAV  B/L with hammer toes.. Skin: No Porokeratosis. No infection or ulcers  Diagnosis:  Onychomycosis, , Pain in right toe, pain in left toes  Treatment & Plan Procedures and Treatment: Consent by patient was obtained for treatment procedures. The patient understood the discussion of treatment and procedures well. All questions were answered thoroughly reviewed. Debridement of mycotic and hypertrophic toenails, 1 through 5 bilateral and clearing of subungual debris. No ulceration, no infection noted.  Return Visit-Office Procedure: Patient instructed to return to the office for a follow up visit 3 months for continued evaluation and treatment.    Claudette Wermuth DPM 

## 2019-07-27 ENCOUNTER — Other Ambulatory Visit: Payer: Self-pay | Admitting: Internal Medicine

## 2019-08-04 ENCOUNTER — Other Ambulatory Visit: Payer: Self-pay | Admitting: Internal Medicine

## 2019-08-12 ENCOUNTER — Telehealth: Payer: Self-pay | Admitting: *Deleted

## 2019-08-12 DIAGNOSIS — E871 Hypo-osmolality and hyponatremia: Secondary | ICD-10-CM

## 2019-08-12 NOTE — Telephone Encounter (Signed)
I ordered the sodium level for patient any other orders.

## 2019-08-12 NOTE — Addendum Note (Signed)
Addended by: Nanci Pina on: 08/12/2019 09:55 AM   Modules accepted: Orders

## 2019-08-12 NOTE — Telephone Encounter (Signed)
Sodium is all I need.  Thanks

## 2019-08-12 NOTE — Telephone Encounter (Signed)
Patient received letter- notified of following:  Notes recorded by Einar Pheasant, MD on 07/11/2019 at 8:46 PM EDT  From previous sodium checks, his sodium has been decreased, but stable. We will need to continue to follow. His kidney function tests, thyroid test and liver function tests have been wnl. I would like to continue to follow. Recheck sodium level in the next couple of weeks.   Appointment for lab has been scheduled. Recheck sodium level.

## 2019-08-13 ENCOUNTER — Other Ambulatory Visit: Payer: Self-pay

## 2019-08-13 ENCOUNTER — Other Ambulatory Visit (INDEPENDENT_AMBULATORY_CARE_PROVIDER_SITE_OTHER): Payer: Medicare Other

## 2019-08-13 DIAGNOSIS — E871 Hypo-osmolality and hyponatremia: Secondary | ICD-10-CM | POA: Diagnosis not present

## 2019-08-13 LAB — SODIUM: Sodium: 133 mEq/L — ABNORMAL LOW (ref 135–145)

## 2019-08-28 ENCOUNTER — Other Ambulatory Visit: Payer: Self-pay | Admitting: Internal Medicine

## 2019-09-05 ENCOUNTER — Other Ambulatory Visit: Payer: Self-pay | Admitting: Internal Medicine

## 2019-09-07 NOTE — Telephone Encounter (Signed)
rx ok's for lorazepam #30 with one refill.

## 2019-09-15 ENCOUNTER — Other Ambulatory Visit: Payer: Self-pay

## 2019-09-15 ENCOUNTER — Ambulatory Visit (INDEPENDENT_AMBULATORY_CARE_PROVIDER_SITE_OTHER): Payer: Medicare Other | Admitting: Internal Medicine

## 2019-09-15 ENCOUNTER — Encounter: Payer: Self-pay | Admitting: Internal Medicine

## 2019-09-15 ENCOUNTER — Ambulatory Visit: Payer: Medicare Other

## 2019-09-15 DIAGNOSIS — E785 Hyperlipidemia, unspecified: Secondary | ICD-10-CM

## 2019-09-15 DIAGNOSIS — I10 Essential (primary) hypertension: Secondary | ICD-10-CM

## 2019-09-15 DIAGNOSIS — Z96651 Presence of right artificial knee joint: Secondary | ICD-10-CM

## 2019-09-15 DIAGNOSIS — I6523 Occlusion and stenosis of bilateral carotid arteries: Secondary | ICD-10-CM | POA: Diagnosis not present

## 2019-09-15 DIAGNOSIS — R739 Hyperglycemia, unspecified: Secondary | ICD-10-CM

## 2019-09-15 DIAGNOSIS — I2510 Atherosclerotic heart disease of native coronary artery without angina pectoris: Secondary | ICD-10-CM | POA: Diagnosis not present

## 2019-09-15 DIAGNOSIS — E039 Hypothyroidism, unspecified: Secondary | ICD-10-CM

## 2019-09-15 DIAGNOSIS — D649 Anemia, unspecified: Secondary | ICD-10-CM | POA: Diagnosis not present

## 2019-09-15 NOTE — Progress Notes (Signed)
Patient ID: Daniel Austin, male   DOB: January 01, 1941, 78 y.o.   MRN: [id number]   Virtual Visit via telephone Note  This visit type was conducted due to national recommendations for restrictions regarding the COVID-19 pandemic (e.g. social distancing).  This format is felt to be most appropriate for this patient at this time.  All issues noted in this document were discussed and addressed.  No physical exam was performed (except for noted visual exam findings with Video Visits).   I connected with Daniel Austin today at  by telephone and verified that I am speaking with the correct person using two identifiers. Location patient: home Location provider: work  Persons participating in the telephone visit: patient, provider  I discussed the limitations, risks, security and privacy concerns of performing an evaluation and management service by telephone and the availability of in person appointments. The patient expressed understanding and agreed to proceed.   Reason for visit: scheduled follow up.    HPI: He is doing well.  Feels good.  Staying active.  Bored. Staying in due to covid restrictions.  No chest pain.  No sob.  No acid reflux.  No abdominal pain.  Bowels moving.  Knees doing well.  Will have a little discomfort if he stands for a long period of time, but overall doing well.  Does not limit his activity.  Has adjusted his diet.  Not eating as much and has been watching his sugar/carb intake.  Has lost weight.  Weight today 200lbs with clothes on.  Blood pressures doing well.  Blood pressures averaging:  119-129/71-79.  Overall feels good.     ROS: See pertinent positives and negatives per HPI.  Past Medical History:  Diagnosis Date  . Anemia   . Anxiety   . Arthritis   . CAD (coronary artery disease)   . Cardiomyopathy ([place])   . Carotid artery stenosis    high-grade symptomatic right sided s/p stent twice  . CHF (congestive heart failure) (HCC)    chronic movement fluid and  history of CHF he noticed in the   . Depression   . Fractures, multiple    Metal rod in left foot  . GERD (gastroesophageal reflux disease)   . Heart block   . Hypercholesterolemia   . Hyperglycemia   . Hypertension   . Hypothyroidism   . Knee pain   . Presence of permanent cardiac pacemaker   . Shortness of breath dyspnea   . Stroke [facility])    clot in neck broke off left side weakness 2008  resolved  . Syncope    second degree heart block s/p St Jude's pacemaker placement 12/08 with his history is limited with any  . Ulcer of gastric fundus     Past Surgical History:  Procedure Laterality Date  . CARDIAC CATHETERIZATION    . CORONARY ANGIOPLASTY     stents x 3  . heart stint  11/04/2014   [facility]  . HERNIA REPAIR     inguinal  . INSERT / REPLACE / REMOVE PACEMAKER    . JOINT REPLACEMENT     right knee  . KNEE ARTHROPLASTY Right 02/08/2016   Procedure: COMPUTER ASSISTED TOTAL KNEE ARTHROPLASTY;  Surgeon: Dereck Leep, MD;  Location: [facility] ORS;  Service: Orthopedics;  Laterality: Right;  . KNEE ARTHROPLASTY Left 06/27/2016   Procedure: COMPUTER ASSISTED TOTAL KNEE ARTHROPLASTY;  Surgeon: Dereck Leep, MD;  Location: [facility] ORS;  Service: Orthopedics;  Laterality: Left;  . PACEMAKER INSERTION  08/2007  . PACEMAKER INSERTION N/A 11/16/2015   Procedure: INSERTION PACEMAKER/ PACEMAKER CHANGE OUT;  Surgeon: Isaias Cowman, MD;  Location: [facility] ORS;  Service: Cardiovascular;  Laterality: N/A;  . PERCUTANEOUS PLACEMENT INTRAVASCULAR STENT CERVICAL CAROTID ARTERY     twice, right carotid artery    Family History  Problem Relation Age of Onset  . Heart disease Mother        CHF  . Heart disease Father        CHF  . Thyroid disease Father        hypothyroidism  . Colon polyps Father   . Diabetes Sister        diet controlled  . Thyroid disease Sister   . Cancer Sister        breast   . Thyroid disease Sister   . Cancer Sister        breast  . Colon cancer Neg Hx    . Prostate cancer Neg Hx     SOCIAL HX: reviewed.    Current Outpatient Medications:  .  acetaminophen (TYLENOL) 500 MG tablet, Take 1,000 mg by mouth every 6 (six) hours as needed., Disp: , Rfl:  .  amLODipine (NORVASC) 2.5 MG tablet, TAKE 1 TABLET BY MOUTH EVERY DAY, Disp: 90 tablet, Rfl: 1 .  Aspirin 81 MG EC tablet, Take 81 mg by mouth daily., Disp: , Rfl:  .  citalopram (CELEXA) 20 MG tablet, TAKE 1 TABLET BY MOUTH EVERY DAY, Disp: 90 tablet, Rfl: 2 .  clopidogrel (PLAVIX) 75 MG tablet, TAKE 1 TABLET BY MOUTH EVERY DAY, Disp: 90 tablet, Rfl: 1 .  fexofenadine (ALLEGRA) 180 MG tablet, Take 180 mg by mouth daily. As needed, Disp: , Rfl:  .  levothyroxine (SYNTHROID) 112 MCG tablet, TAKE 1 TABLET (112 MCG TOTAL) BY MOUTH DAILY BEFORE BREAKFAST., Disp: 90 tablet, Rfl: 1 .  lisinopril (ZESTRIL) 40 MG tablet, TAKE 1 TABLET BY MOUTH EVERY DAY, Disp: 90 tablet, Rfl: 1 .  LORazepam (ATIVAN) 1 MG tablet, TAKE 1 TABLET BY MOUTH AT BEDTIME AS NEEDED, Disp: 30 tablet, Rfl: 1 .  metoprolol tartrate (LOPRESSOR) 25 MG tablet, TAKE 1 TABLET BY MOUTH TWICE A DAY, Disp: 180 tablet, Rfl: 1 .  naproxen sodium (ANAPROX) 220 MG tablet, Take by mouth., Disp: , Rfl:  .  nitroGLYCERIN (NITROSTAT) 0.4 MG SL tablet, Place 0.4 mg under the tongue every 5 (five) minutes as needed. If a third tablet is needed please call 911., Disp: , Rfl:  .  pantoprazole (PROTONIX) 40 MG tablet, TAKE 1 TABLET BY MOUTH EVERY DAY, Disp: 90 tablet, Rfl: 2 .  Probiotic Product (ALIGN) 4 MG CAPS, One tablet per day, Disp: 30 capsule, Rfl: 0 .  rosuvastatin (CRESTOR) 20 MG tablet, TAKE 1 TABLET BY MOUTH EVERY DAY AT NIGHT, Disp: 90 tablet, Rfl: 2 .  triamcinolone (NASACORT) 55 MCG/ACT AERO nasal inhaler, Place into the nose., Disp: , Rfl:  .  triamcinolone cream (KENALOG) 0.1 %, Apply 1 application topically 2 (two) times daily., Disp: 30 g, Rfl: 0  EXAM:  VITALS:  119-129/71-79  GENERAL: alert, oriented, appears well and in no  acute distress  HEENT: atraumatic, conjunttiva clear, no obvious abnormalities on inspection of external nose and ears  NECK: normal movements of the head and neck  LUNGS: on inspection no signs of respiratory distress, breathing rate appears normal, no obvious gross SOB, gasping or wheezing  CV: no obvious cyanosis  PSYCH/NEURO: pleasant and cooperative, no obvious depression or anxiety, speech  and thought processing grossly intact  ASSESSMENT AND PLAN:  Discussed the following assessment and plan:  Adult hypothyroidism On thyroid replacement.  Follow tsh.   Anemia Has had previous GI w/up.  Follow cbc.   Benign essential HTN Blood pressure under good control.  Continue same medication regimen.  Follow pressures.  Follow metabolic panel.    Carotid artery stenosis Followed by [facility].  Last evaluated 03/2019.  Stable.  Recommended f/u in one year.    Coronary atherosclerosis of native coronary artery Followed by cardiology.  Stable.  Continue risk factor modification.    H/O total knee replacement Knees doing well.  Follow.    HLD (hyperlipidemia) On crestor.  Low cholesterol diet and exercise.  Follow lipid panel and liver function tests.    Hyperglycemia Low carb diet and exercise.  Follow met b and a1c.      I discussed the assessment and treatment plan with the patient. The patient was provided an opportunity to ask questions and all were answered. The patient agreed with the plan and demonstrated an understanding of the instructions.   The patient was advised to call back or seek an in-person evaluation if the symptoms worsen or if the condition fails to improve as anticipated.   I spent 15 minutes of non face to face time during this visit.   Einar Pheasant, MD

## 2019-09-20 ENCOUNTER — Encounter: Payer: Self-pay | Admitting: Internal Medicine

## 2019-09-20 NOTE — Assessment & Plan Note (Signed)
On thyroid replacement.  Follow tsh.  

## 2019-09-20 NOTE — Assessment & Plan Note (Signed)
Blood pressure under good control.  Continue same medication regimen.  Follow pressures.  Follow metabolic panel.   

## 2019-09-20 NOTE — Assessment & Plan Note (Signed)
Followed by cardiology.  Stable.  Continue risk factor modification.   

## 2019-09-20 NOTE — Assessment & Plan Note (Signed)
Knees doing well.  Follow.   

## 2019-09-20 NOTE — Assessment & Plan Note (Signed)
On crestor.  Low cholesterol diet and exercise.  Follow lipid panel and liver function tests.   

## 2019-09-20 NOTE — Assessment & Plan Note (Signed)
Low carb diet and exercise.  Follow met b and a1c.   

## 2019-09-20 NOTE — Assessment & Plan Note (Signed)
Followed by [facility].  Last evaluated 03/2019.  Stable.  Recommended f/u in one year.   

## 2019-09-20 NOTE — Assessment & Plan Note (Signed)
Has had previous GI w/up.  Follow [facility].  

## 2019-10-12 ENCOUNTER — Other Ambulatory Visit: Payer: Self-pay

## 2019-10-12 ENCOUNTER — Ambulatory Visit: Payer: Medicare Other | Admitting: Podiatry

## 2019-10-12 ENCOUNTER — Encounter: Payer: Self-pay | Admitting: Podiatry

## 2019-10-12 DIAGNOSIS — B351 Tinea unguium: Secondary | ICD-10-CM

## 2019-10-12 DIAGNOSIS — D689 Coagulation defect, unspecified: Secondary | ICD-10-CM

## 2019-10-12 NOTE — Progress Notes (Signed)
Complaint:  Visit Type: Patient returns to my office for continued preventative foot care services. Complaint: Patient states" my nails have grown long and thick and become painful to walk and wear shoes"  The patient presents for preventative foot care services. No changes to ROS.  Patient is taking plavix.  Podiatric Exam: Vascular: dorsalis pedis and posterior tibial pulses are palpable bilateral. Capillary return is immediate. Temperature gradient is WNL. Skin turgor WNL  Sensorium: Diminished  Semmes Weinstein monofilament test. Normal tactile sensation bilaterally. Nail Exam: Pt has thick disfigured discolored nails with subungual debris noted bilateral entire nail hallux through fifth toenails Ulcer Exam: There is no evidence of ulcer or pre-ulcerative changes or infection. Orthopedic Exam: Muscle tone and strength are WNL. No limitations in general ROM. No crepitus or effusions noted. Foot type and digits show no abnormalities. HAV  B/L with hammer toes.. Skin: No Porokeratosis. No infection or ulcers  Diagnosis:  Onychomycosis, , Pain in right toe, pain in left toes  Treatment & Plan Procedures and Treatment: Consent by patient was obtained for treatment procedures. The patient understood the discussion of treatment and procedures well. All questions were answered thoroughly reviewed. Debridement of mycotic and hypertrophic toenails, 1 through 5 bilateral and clearing of subungual debris. No ulceration, no infection noted.  Return Visit-Office Procedure: Patient instructed to return to the office for a follow up visit 3 months for continued evaluation and treatment.    Gardiner Barefoot DPM

## 2019-10-15 ENCOUNTER — Other Ambulatory Visit: Payer: Medicare Other

## 2019-10-20 ENCOUNTER — Other Ambulatory Visit: Payer: Self-pay | Admitting: Internal Medicine

## 2019-10-30 ENCOUNTER — Other Ambulatory Visit: Payer: Self-pay | Admitting: Internal Medicine

## 2019-10-31 ENCOUNTER — Other Ambulatory Visit: Payer: Self-pay | Admitting: Internal Medicine

## 2019-11-07 ENCOUNTER — Other Ambulatory Visit: Payer: Self-pay | Admitting: Internal Medicine

## 2019-11-07 NOTE — Telephone Encounter (Signed)
rx ok'd for lorazepam #30 with one refill.  

## 2020-01-09 ENCOUNTER — Other Ambulatory Visit: Payer: Self-pay | Admitting: Internal Medicine

## 2020-01-11 ENCOUNTER — Ambulatory Visit: Payer: Medicare Other | Admitting: Podiatry

## 2020-01-12 NOTE — Telephone Encounter (Signed)
rx sent in for lorazepam #30 with one refill.

## 2020-01-14 ENCOUNTER — Ambulatory Visit: Payer: Medicare Other | Admitting: Podiatry

## 2020-01-19 ENCOUNTER — Other Ambulatory Visit: Payer: Self-pay

## 2020-01-19 ENCOUNTER — Encounter: Payer: Self-pay | Admitting: Internal Medicine

## 2020-01-19 ENCOUNTER — Ambulatory Visit (INDEPENDENT_AMBULATORY_CARE_PROVIDER_SITE_OTHER): Payer: Medicare Other | Admitting: Internal Medicine

## 2020-01-19 DIAGNOSIS — E039 Hypothyroidism, unspecified: Secondary | ICD-10-CM | POA: Diagnosis not present

## 2020-01-19 DIAGNOSIS — R739 Hyperglycemia, unspecified: Secondary | ICD-10-CM | POA: Diagnosis not present

## 2020-01-19 DIAGNOSIS — F439 Reaction to severe stress, unspecified: Secondary | ICD-10-CM

## 2020-01-19 DIAGNOSIS — E7800 Pure hypercholesterolemia, unspecified: Secondary | ICD-10-CM | POA: Diagnosis not present

## 2020-01-19 DIAGNOSIS — I2510 Atherosclerotic heart disease of native coronary artery without angina pectoris: Secondary | ICD-10-CM

## 2020-01-19 DIAGNOSIS — I10 Essential (primary) hypertension: Secondary | ICD-10-CM

## 2020-01-19 DIAGNOSIS — D649 Anemia, unspecified: Secondary | ICD-10-CM

## 2020-01-19 NOTE — Progress Notes (Signed)
Patient ID: Daniel Austin, male   DOB: [birth year], 79 y.o.   MRN: [id number]   Subjective:    Patient ID: Daniel Austin, male    DOB: 07/29/1941, 79 y.o.   MRN: [id number]  HPI This visit occurred during the SARS-CoV-2 public health emergency.  Safety protocols were in place, including screening questions prior to the visit, additional usage of staff PPE, and extensive cleaning of exam room while observing appropriate contact time as indicated for disinfecting solutions.  Patient here for a scheduled follow up.  He reports he is doing relatively well.  Weight stable.  Tries to stay active.  No chest pain or sob with increased activity or exertion.  No cough or congestion.  No abdominal pain or cramping.  Bowels stable.  Recent blood pressure checks:  Averaging 120-130s/70-80s.    Past Medical History:  Diagnosis Date  . Anemia   . Anxiety   . Arthritis   . CAD (coronary artery disease)   . Cardiomyopathy ([place])   . Carotid artery stenosis    high-grade symptomatic right sided s/p stent twice  . CHF (congestive heart failure) (HCC)    chronic movement fluid and history of CHF he noticed in the   . Depression   . Fractures, multiple    Metal rod in left foot  . GERD (gastroesophageal reflux disease)   . Heart block   . Hypercholesterolemia   . Hyperglycemia   . Hypertension   . Hypothyroidism   . Knee pain   . Presence of permanent cardiac pacemaker   . Shortness of breath dyspnea   . Stroke [facility])    clot in neck broke off left side weakness 2008  resolved  . Syncope    second degree heart block s/p St Jude's pacemaker placement 12/08 with his history is limited with any  . Ulcer of gastric fundus    Past Surgical History:  Procedure Laterality Date  . CARDIAC CATHETERIZATION    . CORONARY ANGIOPLASTY     stents x 3  . heart stint  11/04/2014   [facility]  . HERNIA REPAIR     inguinal  . INSERT / REPLACE / REMOVE PACEMAKER    . JOINT REPLACEMENT     right knee  .  KNEE ARTHROPLASTY Right 02/08/2016   Procedure: COMPUTER ASSISTED TOTAL KNEE ARTHROPLASTY;  Surgeon: Dereck Leep, MD;  Location: [facility] ORS;  Service: Orthopedics;  Laterality: Right;  . KNEE ARTHROPLASTY Left 06/27/2016   Procedure: COMPUTER ASSISTED TOTAL KNEE ARTHROPLASTY;  Surgeon: Dereck Leep, MD;  Location: [facility] ORS;  Service: Orthopedics;  Laterality: Left;  . PACEMAKER INSERTION  08/2007  . PACEMAKER INSERTION N/A 11/16/2015   Procedure: INSERTION PACEMAKER/ PACEMAKER CHANGE OUT;  Surgeon: Isaias Cowman, MD;  Location: [facility] ORS;  Service: Cardiovascular;  Laterality: N/A;  . PERCUTANEOUS PLACEMENT INTRAVASCULAR STENT CERVICAL CAROTID ARTERY     twice, right carotid artery   Family History  Problem Relation Age of Onset  . Heart disease Mother        CHF  . Heart disease Father        CHF  . Thyroid disease Father        hypothyroidism  . Colon polyps Father   . Diabetes Sister        diet controlled  . Thyroid disease Sister   . Cancer Sister        breast   . Thyroid disease Sister   . Cancer Sister  breast  . Colon cancer Neg Hx   . Prostate cancer Neg Hx    Social History   Socioeconomic History  . Marital status: Married    Spouse name: Not on file  . Number of children: 1  . Years of education: Not on file  . Highest education level: Not on file  Occupational History  . Not on file  Tobacco Use  . Smoking status: Never Smoker  . Smokeless tobacco: Never Used  Substance and Sexual Activity  . Alcohol use: No    Alcohol/week: 0.0 standard drinks  . Drug use: No  . Sexual activity: Not Currently  Other Topics Concern  . Not on file  Social History Narrative  . Not on file   Social Determinants of Health   Financial Resource Strain:   . Difficulty of Paying Living Expenses:   Food Insecurity:   . Worried About Charity fundraiser in the Last Year:   . Arboriculturist in the Last Year:   Transportation Needs:   . Film/video editor  (Medical):   Marland Kitchen Lack of Transportation (Non-Medical):   Physical Activity:   . Days of Exercise per Week:   . Minutes of Exercise per Session:   Stress:   . Feeling of Stress :   Social Connections:   . Frequency of Communication with Friends and Family:   . Frequency of Social Gatherings with Friends and Family:   . Attends Religious Services:   . Active Member of Clubs or Organizations:   . Attends Archivist Meetings:   Marland Kitchen Marital Status:     Outpatient Encounter Medications as of 01/19/2020  Medication Sig  . acetaminophen (TYLENOL) 500 MG tablet Take 1,000 mg by mouth every 6 (six) hours as needed.  Marland Kitchen amLODipine (NORVASC) 2.5 MG tablet TAKE 1 TABLET BY MOUTH EVERY DAY  . Aspirin 81 MG EC tablet Take 81 mg by mouth daily.  . citalopram (CELEXA) 20 MG tablet TAKE 1 TABLET BY MOUTH EVERY DAY  . clopidogrel (PLAVIX) 75 MG tablet TAKE 1 TABLET BY MOUTH EVERY DAY  . fexofenadine (ALLEGRA) 180 MG tablet Take 180 mg by mouth daily. As needed  . levothyroxine (SYNTHROID) 112 MCG tablet TAKE 1 TABLET (112 MCG TOTAL) BY MOUTH DAILY BEFORE BREAKFAST.  Marland Kitchen lisinopril (ZESTRIL) 40 MG tablet TAKE 1 TABLET BY MOUTH EVERY DAY  . LORazepam (ATIVAN) 1 MG tablet TAKE 1 TABLET BY MOUTH EVERY DAY AT BEDTIME AS NEEDED  . metoprolol tartrate (LOPRESSOR) 25 MG tablet TAKE 1 TABLET BY MOUTH TWICE A DAY  . naproxen sodium (ANAPROX) 220 MG tablet Take by mouth.  . nitroGLYCERIN (NITROSTAT) 0.4 MG SL tablet Place 0.4 mg under the tongue every 5 (five) minutes as needed. If a third tablet is needed please call 911.  . pantoprazole (PROTONIX) 40 MG tablet TAKE 1 TABLET BY MOUTH EVERY DAY  . Probiotic Product (ALIGN) 4 MG CAPS One tablet per day  . rosuvastatin (CRESTOR) 20 MG tablet TAKE 1 TABLET BY MOUTH EVERY DAY AT NIGHT  . triamcinolone (NASACORT) 55 MCG/ACT AERO nasal inhaler Place into the nose.  . triamcinolone cream (KENALOG) 0.1 % Apply 1 application topically 2 (two) times daily.   No  facility-administered encounter medications on file as of 01/19/2020.    Review of Systems  Constitutional: Negative for appetite change and unexpected weight change.  HENT: Negative for congestion and sinus pressure.   Respiratory: Negative for cough, chest tightness and shortness of  breath.   Cardiovascular: Negative for chest pain, palpitations and leg swelling.  Gastrointestinal: Negative for abdominal pain, diarrhea, nausea and vomiting.  Genitourinary: Negative for difficulty urinating and dysuria.  Musculoskeletal: Negative for joint swelling and myalgias.  Skin: Negative for color change and rash.  Neurological: Negative for dizziness, light-headedness and headaches.  Psychiatric/Behavioral: Negative for agitation and dysphoric mood.       Objective:    Physical Exam Vitals reviewed.  Constitutional:      General: He is not in acute distress.    Appearance: Normal appearance. He is well-developed.  HENT:     Head: Normocephalic and atraumatic.     Right Ear: External ear normal.     Left Ear: External ear normal.  Eyes:     General: No scleral icterus.       Right eye: No discharge.        Left eye: No discharge.     Conjunctiva/sclera: Conjunctivae normal.  Cardiovascular:     Rate and Rhythm: Normal rate and regular rhythm.  Pulmonary:     Effort: Pulmonary effort is normal. No respiratory distress.     Breath sounds: Normal breath sounds.  Abdominal:     General: Bowel sounds are normal.     Palpations: Abdomen is soft.     Tenderness: There is no abdominal tenderness.  Musculoskeletal:        General: No swelling or tenderness.     Cervical back: Neck supple. No tenderness.  Lymphadenopathy:     Cervical: No cervical adenopathy.  Skin:    Findings: No erythema or rash.  Neurological:     Mental Status: He is alert.  Psychiatric:        Mood and Affect: Mood normal.        Behavior: Behavior normal.     BP 136/82   Pulse 97   Temp 97.7 F (36.5 C)    Resp 16   Ht '5\' 11"'  (1.803 m)   Wt 201 lb (91.2 kg)   SpO2 99%   BMI 28.03 kg/m  Wt Readings from Last 3 Encounters:  01/19/20 201 lb (91.2 kg)  09/15/19 200 lb (90.7 kg)  05/08/19 208 lb (94.3 kg)     Lab Results  Component Value Date   WBC 4.2 05/18/2019   HGB 12.3 (L) 05/18/2019   HCT 36.7 (L) 05/18/2019   PLT 259.0 05/18/2019   GLUCOSE 97 05/28/2019   CHOL 130 05/05/2019   TRIG 100.0 05/05/2019   HDL 53.50 05/05/2019   LDLCALC 57 05/05/2019   ALT 9 05/05/2019   AST 15 05/05/2019   NA 133 (L) 08/13/2019   K 4.5 05/28/2019   CL 97 05/28/2019   CREATININE 0.89 05/28/2019   BUN 9 05/28/2019   CO2 24 05/28/2019   TSH 1.64 05/18/2019   PSA 0.41 04/18/2018   INR 1.13 06/11/2016   HGBA1C 6.1 05/05/2019   MICROALBUR 1.6 05/25/2014    No results found.     Assessment & Plan:   Problem List Items Addressed This Visit    Adult hypothyroidism    On thyroid replacement.  Follow tsh.       Anemia    Has had GI w/up.  Follow cbc.        Coronary atherosclerosis of native coronary artery    Stable.  Continue risk factor modification.  Continue crestor, metoprolol and lisinopril.  Followed by cardiology.       Essential hypertension, benign    Blood  pressure as outlined.  Continue amlodipine, metoprolol and lisinopril.  Follow pressures.  Follow metabolic panel.         Hyperglycemia    Low carb diet and exercise.  Follow met b and a1c.       Hypothyroidism    On thyroid replacement.  Follow tsh.        Pure hypercholesterolemia    On crestor.  Low cholesterol diet and exercise.  Follow lipid panel and liver function tests.        Stress    Discussed with him today.  Overall handling things relatively well.  Takes lorazepam prn.  Follow.  Continue citalopram.            Einar Pheasant, MD

## 2020-01-24 ENCOUNTER — Encounter: Payer: Self-pay | Admitting: Internal Medicine

## 2020-01-24 NOTE — Assessment & Plan Note (Signed)
Low carb diet and exercise.  Follow met b and a1c.  

## 2020-01-24 NOTE — Assessment & Plan Note (Signed)
Discussed with him today.  Overall handling things relatively well.  Takes lorazepam prn.  Follow.  Continue citalopram.

## 2020-01-24 NOTE — Assessment & Plan Note (Signed)
Has had GI w/up.  Follow cbc.  

## 2020-01-24 NOTE — Assessment & Plan Note (Signed)
On thyroid replacement.  Follow tsh.  

## 2020-01-24 NOTE — Assessment & Plan Note (Signed)
On crestor.  Low cholesterol diet and exercise.  Follow lipid panel and liver function tests.   

## 2020-01-24 NOTE — Assessment & Plan Note (Signed)
Stable.  Continue risk factor modification.  Continue crestor, metoprolol and lisinopril.  Followed by cardiology.   

## 2020-01-24 NOTE — Assessment & Plan Note (Signed)
Blood pressure as outlined.  Continue amlodipine, metoprolol and lisinopril.  Follow pressures.  Follow metabolic panel.

## 2020-01-25 ENCOUNTER — Other Ambulatory Visit: Payer: Self-pay

## 2020-01-25 ENCOUNTER — Ambulatory Visit: Payer: Medicare Other | Admitting: Podiatry

## 2020-01-25 ENCOUNTER — Encounter: Payer: Self-pay | Admitting: Podiatry

## 2020-01-25 VITALS — Temp 98.3°F

## 2020-01-25 DIAGNOSIS — B351 Tinea unguium: Secondary | ICD-10-CM | POA: Diagnosis not present

## 2020-01-25 DIAGNOSIS — M21619 Bunion of unspecified foot: Secondary | ICD-10-CM | POA: Diagnosis not present

## 2020-01-25 DIAGNOSIS — D689 Coagulation defect, unspecified: Secondary | ICD-10-CM

## 2020-01-25 NOTE — Progress Notes (Signed)
This patient returns to my office for at risk foot care.  This patient requires this care by a professional since this patient will be at risk due to having coagulation defect due to plavix.   This patient is unable to cut nails himself since the patient cannot reach his nails.These nails are painful walking and wearing shoes.  This patient presents for at risk foot care today.  General Appearance  Alert, conversant and in no acute stress.  Vascular  Dorsalis pedis and posterior tibial  pulses are palpable  bilaterally.  Capillary return is within normal limits  bilaterally. Temperature is within normal limits  bilaterally.  Neurologic  Senn-Weinstein monofilament wire test within normal limits  bilaterally. Muscle power within normal limits bilaterally.  Nails Thick disfigured discolored nails with subungual debris  from hallux to fifth toes bilaterally. No evidence of bacterial infection or drainage bilaterally.  Orthopedic  No limitations of motion  feet .  No crepitus or effusions noted.  No bony pathology or digital deformities noted.  HAV  B/L.  Skin  normotropic skin with no porokeratosis noted bilaterally.  No signs of infections or ulcers noted.     Onychomycosis  Pain in right toes  Pain in left toes  Consent was obtained for treatment procedures.   Mechanical debridement of nails 1-5  bilaterally performed with a nail nipper.  Filed with dremel without incident.    Return office visit   3 months                   Told patient to return for periodic foot care and evaluation due to potential at risk complications.   Gardiner Barefoot DPM

## 2020-02-03 ENCOUNTER — Telehealth: Payer: Self-pay | Admitting: Internal Medicine

## 2020-02-03 NOTE — Telephone Encounter (Signed)
Left message for patient to call back and schedule Medicare Annual Wellness Visit (AWV) either virtually or audio only.  Last AWV 09/12/18 ; please schedule at anytime with Denisa O'Brien-Blaney at [facility].

## 2020-02-03 NOTE — Telephone Encounter (Signed)
Pt scheduled for 02/05/20 @ 11am.

## 2020-02-05 ENCOUNTER — Ambulatory Visit (INDEPENDENT_AMBULATORY_CARE_PROVIDER_SITE_OTHER): Payer: Medicare Other

## 2020-02-05 VITALS — BP 138/79 | Ht 71.0 in | Wt 201.0 lb

## 2020-02-05 DIAGNOSIS — Z0000 Encounter for general adult medical examination without abnormal findings: Secondary | ICD-10-CM

## 2020-02-05 NOTE — Progress Notes (Addendum)
Subjective:   Daniel Austin is a 79 y.o. male who presents for Medicare Annual/Subsequent preventive examination.  Review of Systems:  No ROS.  Medicare Wellness Virtual Visit.  Visual/audio telehealth visit. Vital signs provided by patient.  See social history for additional risk factors.   Cardiac Risk Factors include: advanced age (>48men, >61 women);male gender;hypertension     Objective:    Vitals: BP 138/79 (BP Location: Left Arm, Patient Position: Sitting, Cuff Size: Normal)   Ht 5\' 11"  (1.803 m)   Wt 201 lb (91.2 kg)   BMI 28.03 kg/m   Body mass index is 28.03 kg/m.  Advanced Directives 02/05/2020 09/12/2018 09/12/2017 11/09/2016 09/11/2016 06/27/2016 06/27/2016  Does Patient Have a Medical Advance Directive? No No No No No No No  Would patient like information on creating a medical advance directive? Yes (MAU/Ambulatory/Procedural Areas - Information given) No - Patient declined Yes (MAU/Ambulatory/Procedural Areas - Information given) - No - Patient declined No - patient declined information -    Tobacco Social History   Tobacco Use  Smoking Status Never Smoker  Smokeless Tobacco Never Used     Counseling given: Not Answered   Clinical Intake:  Pre-visit preparation completed: Yes           How often do you need to have someone help you when you read instructions, pamphlets, or other written materials from your doctor or pharmacy?: 1 - Never  Interpreter Needed?: No     Past Medical History:  Diagnosis Date  . Anemia   . Anxiety   . Arthritis   . CAD (coronary artery disease)   . Cardiomyopathy ([place])   . Carotid artery stenosis    high-grade symptomatic right sided s/p stent twice  . CHF (congestive heart failure) (HCC)    chronic movement fluid and history of CHF he noticed in the   . Depression   . Fractures, multiple    Metal rod in left foot  . GERD (gastroesophageal reflux disease)   . Heart block   . Hypercholesterolemia   .  Hyperglycemia   . Hypertension   . Hypothyroidism   . Knee pain   . Presence of permanent cardiac pacemaker   . Shortness of breath dyspnea   . Stroke [facility])    clot in neck broke off left side weakness 2008  resolved  . Syncope    second degree heart block s/p St Jude's pacemaker placement 12/08 with his history is limited with any  . Ulcer of gastric fundus    Past Surgical History:  Procedure Laterality Date  . CARDIAC CATHETERIZATION    . CORONARY ANGIOPLASTY     stents x 3  . heart stint  11/04/2014   [facility]  . HERNIA REPAIR     inguinal  . INSERT / REPLACE / REMOVE PACEMAKER    . JOINT REPLACEMENT     right knee  . KNEE ARTHROPLASTY Right 02/08/2016   Procedure: COMPUTER ASSISTED TOTAL KNEE ARTHROPLASTY;  Surgeon: Dereck Leep, MD;  Location: [facility] ORS;  Service: Orthopedics;  Laterality: Right;  . KNEE ARTHROPLASTY Left 06/27/2016   Procedure: COMPUTER ASSISTED TOTAL KNEE ARTHROPLASTY;  Surgeon: Dereck Leep, MD;  Location: [facility] ORS;  Service: Orthopedics;  Laterality: Left;  . PACEMAKER INSERTION  08/2007  . PACEMAKER INSERTION N/A 11/16/2015   Procedure: INSERTION PACEMAKER/ PACEMAKER CHANGE OUT;  Surgeon: Isaias Cowman, MD;  Location: [facility] ORS;  Service: Cardiovascular;  Laterality: N/A;  . PERCUTANEOUS PLACEMENT INTRAVASCULAR STENT CERVICAL  CAROTID ARTERY     twice, right carotid artery   Family History  Problem Relation Age of Onset  . Heart disease Mother        CHF  . Heart disease Father        CHF  . Thyroid disease Father        hypothyroidism  . Colon polyps Father   . Diabetes Sister        diet controlled  . Thyroid disease Sister   . Cancer Sister        breast   . Thyroid disease Sister   . Cancer Sister        breast  . Colon cancer Neg Hx   . Prostate cancer Neg Hx    Social History   Socioeconomic History  . Marital status: Married    Spouse name: Not on file  . Number of children: 1  . Years of education: Not on file  .  Highest education level: Not on file  Occupational History  . Not on file  Tobacco Use  . Smoking status: Never Smoker  . Smokeless tobacco: Never Used  Substance and Sexual Activity  . Alcohol use: No    Alcohol/week: 0.0 standard drinks  . Drug use: No  . Sexual activity: Not Currently  Other Topics Concern  . Not on file  Social History Narrative  . Not on file   Social Determinants of Health   Financial Resource Strain:   . Difficulty of Paying Living Expenses:   Food Insecurity:   . Worried About Charity fundraiser in the Last Year:   . Arboriculturist in the Last Year:   Transportation Needs:   . Film/video editor (Medical):   Marland Kitchen Lack of Transportation (Non-Medical):   Physical Activity:   . Days of Exercise per Week:   . Minutes of Exercise per Session:   Stress:   . Feeling of Stress :   Social Connections:   . Frequency of Communication with Friends and Family:   . Frequency of Social Gatherings with Friends and Family:   . Attends Religious Services:   . Active Member of Clubs or Organizations:   . Attends Archivist Meetings:   Marland Kitchen Marital Status:     Outpatient Encounter Medications as of 02/05/2020  Medication Sig  . acetaminophen (TYLENOL) 500 MG tablet Take 1,000 mg by mouth every 6 (six) hours as needed.  Marland Kitchen amLODipine (NORVASC) 2.5 MG tablet TAKE 1 TABLET BY MOUTH EVERY DAY  . Aspirin 81 MG EC tablet Take 81 mg by mouth daily.  . citalopram (CELEXA) 20 MG tablet TAKE 1 TABLET BY MOUTH EVERY DAY  . clopidogrel (PLAVIX) 75 MG tablet TAKE 1 TABLET BY MOUTH EVERY DAY  . fexofenadine (ALLEGRA) 180 MG tablet Take 180 mg by mouth daily. As needed  . levothyroxine (SYNTHROID) 112 MCG tablet TAKE 1 TABLET (112 MCG TOTAL) BY MOUTH DAILY BEFORE BREAKFAST.  Marland Kitchen lisinopril (ZESTRIL) 40 MG tablet TAKE 1 TABLET BY MOUTH EVERY DAY  . LORazepam (ATIVAN) 1 MG tablet TAKE 1 TABLET BY MOUTH EVERY DAY AT BEDTIME AS NEEDED  . metoprolol tartrate (LOPRESSOR) 25  MG tablet TAKE 1 TABLET BY MOUTH TWICE A DAY  . naproxen sodium (ANAPROX) 220 MG tablet Take by mouth.  . nitroGLYCERIN (NITROSTAT) 0.4 MG SL tablet Place 0.4 mg under the tongue every 5 (five) minutes as needed. If a third tablet is needed please call 911.  Marland Kitchen  pantoprazole (PROTONIX) 40 MG tablet TAKE 1 TABLET BY MOUTH EVERY DAY  . Probiotic Product (ALIGN) 4 MG CAPS One tablet per day  . rosuvastatin (CRESTOR) 20 MG tablet TAKE 1 TABLET BY MOUTH EVERY DAY AT NIGHT  . triamcinolone (NASACORT) 55 MCG/ACT AERO nasal inhaler Place into the nose.  . triamcinolone cream (KENALOG) 0.1 % Apply 1 application topically 2 (two) times daily.   No facility-administered encounter medications on file as of 02/05/2020.    Activities of Daily Living In your present state of health, do you have any difficulty performing the following activities: 02/05/2020  Hearing? N  Vision? N  Difficulty concentrating or making decisions? N  Walking or climbing stairs? N  Dressing or bathing? N  Doing errands, shopping? N  Preparing Food and eating ? N  Using the Toilet? N  In the past six months, have you accidently leaked urine? N  Do you have problems with loss of bowel control? N  Managing your Medications? N  Managing your Finances? N  Housekeeping or managing your Housekeeping? N  Some recent data might be hidden    Patient Care Team: Einar Pheasant, MD as PCP - General (Internal Medicine)   Assessment:   This is a routine wellness examination for [place].  Nurse connected with patient 02/05/20 at 11:00 AM EDT by a telephone enabled telemedicine application, patient has no access to video or difficulty with video and verified that I am speaking with the correct person using two identifiers. Patient stated full name and DOB. Patient gave permission to continue with virtual visit. Patient's location was at home and Nurse's location was at [place] office.   Patient is alert and oriented x3. Patient denies  difficulty focusing or concentrating. Patient likes to read and keeps up with current events for brain health.   Health Maintenance Due: -Covid vaccine- deferred. Patient plans to wait later in the season. See completed HM at the end of note.   Eye: Visual acuity not assessed. Virtual visit. Followed by their ophthalmologist.  Dental: UTD  Hearing: Demonstrates normal hearing during visit.  Safety:  Patient feels safe at home- yes Patient does have smoke detectors at home- yes Patient does wear sunscreen or protective clothing when in direct sunlight - yes Patient does wear seat belt when in a moving vehicle - yes Patient drives- yes Adequate lighting in walkways free from debris- yes Grab bars and handrails used as appropriate- yes Ambulates with an assistive device- no Cell phone on person when ambulating outside of the home-yes  Social: Alcohol intake - no    Smoking history- never  Smokers in home? none Illicit drug use? none  Medication: Taking as directed and without issues.  Pill box in use -yes  Self managed - yes   Covid-19: Precautions and sickness symptoms discussed. Wears mask, social distancing, hand hygiene as appropriate.   Activities of Daily Living Patient denies needing assistance with: household chores, feeding themselves, getting from bed to chair, getting to the toilet, bathing/showering, dressing, managing money, or preparing meals.   Discussed the importance of a healthy diet, water intake and the benefits of aerobic exercise.   Physical activity- walks the dog daily  Diet:  Regular Water: good intake Caffeine: occasional   Other Providers Patient Care Team: Einar Pheasant, MD as PCP - General (Internal Medicine) Exercise Activities and Dietary recommendations Current Exercise Habits: Home exercise routine, Type of exercise: walking, Intensity: Mild  Goals      Patient Stated   .  I should walk my dog more and start gym exercises  (pt-stated)       Fall Risk Fall Risk  02/05/2020 09/15/2019 09/12/2018 09/12/2017 05/03/2017  Falls in the past year? 0 0 0 No No  Number falls in past yr: 0 - - - -  Follow up Falls evaluation completed Falls evaluation completed - - -   Timed Get Up and Go Performed: no, virtual visit  Depression Screen PHQ 2/9 Scores 02/05/2020 09/12/2018 09/12/2017 05/03/2017  PHQ - 2 Score 0 0 0 0  PHQ- 9 Score - - 0 -    Cognitive Function MMSE - Mini Mental State Exam 09/12/2017  Orientation to time 5  Orientation to Place 5  Registration 3  Attention/ Calculation 5  Recall 3  Language- name 2 objects 2  Language- repeat 1  Language- follow 3 step command 3  Language- read & follow direction 1  Write a sentence 1  Copy design 1  Total score 30     6CIT Screen 02/05/2020 09/12/2018 09/11/2016  What Year? 0 points 0 points 0 points  What month? 0 points 0 points 0 points  What time? - 0 points 0 points  Count back from 20 0 points 0 points 0 points  Months in reverse - 0 points 0 points  Repeat phrase 2 points 0 points -  Total Score - 0 -    Immunization History  Administered Date(s) Administered  . DTaP 11/04/2006  . Fluad Quad(high Dose 65+) 05/30/2019  . Influenza Nasal 09/03/2012, 07/06/2013  . Influenza, High Dose Seasonal PF 10/25/2016, 09/25/2017, 06/20/2018  . Influenza,inj,Quad PF,6+ Mos 05/28/2014, 05/25/2015  . Influenza-Unspecified 07/22/2012, 07/17/2013, 05/28/2014, 05/25/2015  . Pneumococcal Conjugate-13 01/10/2017  . Pneumococcal Polysaccharide-23 04/18/2018  . Td 01/21/2019   Screening Tests Health Maintenance  Topic Date Due  . COVID-19 Vaccine (1) 02/04/2021 (Originally 05/17/1957)  . INFLUENZA VACCINE  04/24/2020  . TETANUS/TDAP  01/20/2029  . PNA vac Low Risk Adult  Completed       Plan:   Keep all routine maintenance appointments.   Next scheduled fasting lab 05/12/20 @ 10:00  Cpe 05/23/20 @ 10:30  End of life planning; Advanced aging;  Advanced directives discussed.  No HCPOA/Living Will.  Additional information mailed per request to help them start the conversation with family.    Medicare Attestation I have personally reviewed: The patient's medical and social history Their use of alcohol, tobacco or illicit drugs Their current medications and supplements The patient's functional ability including ADLs,fall risks, home safety risks, cognitive, and hearing and visual impairment Diet and physical activities Evidence for depression   I have reviewed and discussed with patient certain preventive protocols, quality metrics, and best practice recommendations.   Varney Biles, LPN  [id number]   Reviewed above information.  Agree with assessment and plan.   Dr Nicki Reaper

## 2020-02-05 NOTE — Patient Instructions (Addendum)
  Daniel Austin , Thank you for taking time to come for your Medicare Wellness Visit. I appreciate your ongoing commitment to your health goals. Please review the following plan we discussed and let me know if I can assist you in the future.   These are the goals we discussed: Goals      Patient Stated   . I should walk my dog more and start gym exercises (pt-stated)       This is a list of the screening recommended for you and due dates:  Health Maintenance  Topic Date Due  . COVID-19 Vaccine (1) 02/04/2021*  . Flu Shot  04/24/2020  . Tetanus Vaccine  01/20/2029  . Pneumonia vaccines  Completed  *Topic was postponed. The date shown is not the original due date.

## 2020-03-12 ENCOUNTER — Other Ambulatory Visit: Payer: Self-pay | Admitting: Internal Medicine

## 2020-03-15 NOTE — Telephone Encounter (Signed)
rx ok'd for lorazepam #30 with one refill.  

## 2020-04-20 ENCOUNTER — Emergency Department
Admission: EM | Admit: 2020-04-20 | Discharge: 2020-04-20 | Disposition: A | Discharge status: Home / Self Care | Payer: Medicare Other | Attending: Emergency Medicine | Admitting: Emergency Medicine

## 2020-04-20 ENCOUNTER — Other Ambulatory Visit: Payer: Self-pay

## 2020-04-20 ENCOUNTER — Telehealth: Payer: Self-pay | Admitting: Internal Medicine

## 2020-04-20 ENCOUNTER — Encounter: Payer: Self-pay | Admitting: Emergency Medicine

## 2020-04-20 DIAGNOSIS — Z5321 Procedure and treatment not carried out due to patient leaving prior to being seen by health care provider: Secondary | ICD-10-CM | POA: Diagnosis not present

## 2020-04-20 DIAGNOSIS — I959 Hypotension, unspecified: Secondary | ICD-10-CM | POA: Diagnosis not present

## 2020-04-20 DIAGNOSIS — R000 Tachycardia, unspecified: Secondary | ICD-10-CM | POA: Diagnosis not present

## 2020-04-20 LAB — CBC
HCT: 37.1 % — ABNORMAL LOW (ref 39.0–52.0)
Hemoglobin: 12.5 g/dL — ABNORMAL LOW (ref 13.0–17.0)
MCH: 29.9 pg (ref 26.0–34.0)
MCHC: 33.7 g/dL (ref 30.0–36.0)
MCV: 88.8 fL (ref 80.0–100.0)
Platelets: 221 10*3/uL (ref 150–400)
RBC: 4.18 MIL/uL — ABNORMAL LOW (ref 4.22–5.81)
RDW: 13 % (ref 11.5–15.5)
WBC: 11.3 10*3/uL — ABNORMAL HIGH (ref 4.0–10.5)
nRBC: 0 % (ref 0.0–0.2)

## 2020-04-20 LAB — BASIC METABOLIC PANEL
Anion gap: 12 (ref 5–15)
BUN: 12 mg/dL (ref 8–23)
CO2: 21 mmol/L — ABNORMAL LOW (ref 22–32)
Calcium: 8.6 mg/dL — ABNORMAL LOW (ref 8.9–10.3)
Chloride: 99 mmol/L (ref 98–111)
Creatinine, Ser: 1.05 mg/dL (ref 0.61–1.24)
GFR calc Af Amer: >60 mL/min (ref >60)
GFR calc non Af Amer: >60 mL/min (ref >60)
Glucose, Bld: 169 mg/dL — ABNORMAL HIGH (ref 70–99)
Potassium: 4.1 mmol/L (ref 3.5–5.1)
Sodium: 132 mmol/L — ABNORMAL LOW (ref 135–145)

## 2020-04-20 MED ORDER — SODIUM CHLORIDE 0.9% FLUSH: 3.0000 mL | Freq: Once | INTRAVENOUS | Status: DC

## 2020-04-20 NOTE — Telephone Encounter (Signed)
Pt's daughter called said pt fell and his blood pressure was high. They got him into bed and pt is currently not eating. Sent to [facility] for triage.

## 2020-04-20 NOTE — Telephone Encounter (Signed)
Discussed with Fransisco Beau.  Confirmed pt going to ER.

## 2020-04-20 NOTE — ED Triage Notes (Signed)
Pt presents to ED via [facility] from home with c/o near syncope. Pt states went to get out of bed and "got weak and sat down on his tail". Pt presents to triage A&O. Denies injury from near syncopal episode.

## 2020-04-20 NOTE — ED Triage Notes (Signed)
Pt in via EMS from home with c/o low BP. Sitting 98/52, standing 81/51. Pt recevied 513mls of fluid, BP 100/82, #18 left wrist

## 2020-04-20 NOTE — Telephone Encounter (Signed)
FYI  Spoken to daughter. His BP spiked again at 168/80 and he started shaking really bad so they decided to go to the ED. They have been checking his bp regularly everyday. The bp ranges from 116-168/60-88. Due to his past medical history, they are very worried about his sx. They wanted to let Dr Nicki Reaper know what was happening.

## 2020-04-20 NOTE — Telephone Encounter (Signed)
Spoken with daughter patient misjudged the bed and fell directly on his bottom. They where mainly calling due to patients BP was spiking at times and they where worried about it. They wanted to know if both could be affecting him. He has fallen a few times over the [past two months. They requested an appointment and appointment was given for May 02, 2020. But if patient is having any sx of high bp, sob, arm px, chest px, headaches, blurry vision, dropping and slurred speech they need to go to nearest ED. They stated they would comply with the directions.

## 2020-04-20 NOTE — Telephone Encounter (Signed)
Need to confirm no head injury with fall.  Any confusion?  Monitor blood pressure and record readings.  How high has blood pressure been.  If any acute symptoms - needs to be seen.

## 2020-04-21 ENCOUNTER — Other Ambulatory Visit: Payer: Self-pay | Admitting: Internal Medicine

## 2020-04-21 DIAGNOSIS — R531 Weakness: Secondary | ICD-10-CM | POA: Diagnosis not present

## 2020-04-21 DIAGNOSIS — Z5181 Encounter for therapeutic drug level monitoring: Secondary | ICD-10-CM | POA: Diagnosis not present

## 2020-04-21 DIAGNOSIS — Z7982 Long term (current) use of aspirin: Secondary | ICD-10-CM | POA: Diagnosis not present

## 2020-04-21 DIAGNOSIS — Z20822 Contact with and (suspected) exposure to covid-19: Secondary | ICD-10-CM | POA: Diagnosis not present

## 2020-04-21 DIAGNOSIS — I2510 Atherosclerotic heart disease of native coronary artery without angina pectoris: Secondary | ICD-10-CM | POA: Diagnosis not present

## 2020-04-21 DIAGNOSIS — Z8249 Family history of ischemic heart disease and other diseases of the circulatory system: Secondary | ICD-10-CM | POA: Diagnosis not present

## 2020-04-21 DIAGNOSIS — Z951 Presence of aortocoronary bypass graft: Secondary | ICD-10-CM | POA: Diagnosis not present

## 2020-04-21 DIAGNOSIS — I509 Heart failure, unspecified: Secondary | ICD-10-CM | POA: Diagnosis not present

## 2020-04-21 DIAGNOSIS — Z950 Presence of cardiac pacemaker: Secondary | ICD-10-CM | POA: Diagnosis not present

## 2020-04-21 DIAGNOSIS — R000 Tachycardia, unspecified: Secondary | ICD-10-CM | POA: Diagnosis not present

## 2020-04-21 DIAGNOSIS — Z79899 Other long term (current) drug therapy: Secondary | ICD-10-CM | POA: Diagnosis not present

## 2020-04-21 DIAGNOSIS — I110 Hypertensive heart disease with heart failure: Secondary | ICD-10-CM | POA: Diagnosis not present

## 2020-04-21 DIAGNOSIS — I255 Ischemic cardiomyopathy: Secondary | ICD-10-CM | POA: Diagnosis not present

## 2020-04-21 NOTE — Telephone Encounter (Signed)
Discussed with patients daughter. She is going to take him back to the ED- probably at [facility] because he was transported by EMS to [facility] and waited for 4 hours. Was going to be a 10 hour wait so they left.

## 2020-04-21 NOTE — Telephone Encounter (Signed)
If he is having fever and blood pressure fluctuating - really needs to be evaluated.  Concern regarding infection - also with confusion yesterday - make sure not septic, etc.  Also, if concern regarding pacemaker - needs f/u with cardiology.

## 2020-04-21 NOTE — Telephone Encounter (Signed)
Please call pt daughter back

## 2020-04-21 NOTE — Telephone Encounter (Signed)
Pt couldn't be seen yesterday at ER. It was 10 hour wait so they went home. The paramedics said his pacemaker was firing too fast. [facility] just checked the pace maker they said it was fine. Since then he is running a fever and his blood pressure is going up and down. He had first moderna shot on 04/14/20. She wants to know what she needs to do?

## 2020-04-25 ENCOUNTER — Other Ambulatory Visit (INDEPENDENT_AMBULATORY_CARE_PROVIDER_SITE_OTHER): Payer: Self-pay | Admitting: Nurse Practitioner

## 2020-04-25 DIAGNOSIS — I6523 Occlusion and stenosis of bilateral carotid arteries: Secondary | ICD-10-CM

## 2020-04-26 ENCOUNTER — Ambulatory Visit (INDEPENDENT_AMBULATORY_CARE_PROVIDER_SITE_OTHER): Payer: Medicare Other | Admitting: Nurse Practitioner

## 2020-04-26 ENCOUNTER — Encounter (INDEPENDENT_AMBULATORY_CARE_PROVIDER_SITE_OTHER): Payer: Medicare Other

## 2020-04-28 ENCOUNTER — Other Ambulatory Visit: Payer: Self-pay | Admitting: Internal Medicine

## 2020-04-29 ENCOUNTER — Ambulatory Visit (INDEPENDENT_AMBULATORY_CARE_PROVIDER_SITE_OTHER): Payer: Medicare Other | Admitting: Internal Medicine

## 2020-04-29 ENCOUNTER — Other Ambulatory Visit: Payer: Self-pay

## 2020-04-29 DIAGNOSIS — I10 Essential (primary) hypertension: Secondary | ICD-10-CM | POA: Diagnosis not present

## 2020-04-29 DIAGNOSIS — I2510 Atherosclerotic heart disease of native coronary artery without angina pectoris: Secondary | ICD-10-CM

## 2020-04-29 DIAGNOSIS — E7800 Pure hypercholesterolemia, unspecified: Secondary | ICD-10-CM

## 2020-04-29 DIAGNOSIS — E785 Hyperlipidemia, unspecified: Secondary | ICD-10-CM

## 2020-04-29 DIAGNOSIS — R531 Weakness: Secondary | ICD-10-CM

## 2020-04-29 DIAGNOSIS — D649 Anemia, unspecified: Secondary | ICD-10-CM | POA: Diagnosis not present

## 2020-04-29 DIAGNOSIS — E039 Hypothyroidism, unspecified: Secondary | ICD-10-CM | POA: Diagnosis not present

## 2020-04-29 DIAGNOSIS — I6523 Occlusion and stenosis of bilateral carotid arteries: Secondary | ICD-10-CM

## 2020-04-29 DIAGNOSIS — R739 Hyperglycemia, unspecified: Secondary | ICD-10-CM

## 2020-04-29 DIAGNOSIS — E871 Hypo-osmolality and hyponatremia: Secondary | ICD-10-CM

## 2020-04-29 DIAGNOSIS — F439 Reaction to severe stress, unspecified: Secondary | ICD-10-CM

## 2020-04-29 LAB — CBC WITH DIFFERENTIAL/PLATELET
Basophils Absolute: 0.1 10*3/uL (ref 0.0–0.1)
Basophils Relative: 1.2 % (ref 0.0–3.0)
Eosinophils Absolute: 0.2 10*3/uL (ref 0.0–0.7)
Eosinophils Relative: 4.1 % (ref 0.0–5.0)
HCT: 35.5 % — ABNORMAL LOW (ref 39.0–52.0)
Hemoglobin: 12 g/dL — ABNORMAL LOW (ref 13.0–17.0)
Lymphocytes Relative: 18.2 % (ref 12.0–46.0)
Lymphs Abs: 0.9 10*3/uL (ref 0.7–4.0)
MCHC: 33.7 g/dL (ref 30.0–36.0)
MCV: 89.6 fl (ref 78.0–100.0)
Monocytes Absolute: 0.6 10*3/uL (ref 0.1–1.0)
Monocytes Relative: 11.3 % (ref 3.0–12.0)
Neutro Abs: 3.4 10*3/uL (ref 1.4–7.7)
Neutrophils Relative %: 65.2 % (ref 43.0–77.0)
Platelets: 422 10*3/uL — ABNORMAL HIGH (ref 150.0–400.0)
RBC: 3.96 Mil/uL — ABNORMAL LOW (ref 4.22–5.81)
RDW: 13.8 % (ref 11.5–15.5)
WBC: 5.2 10*3/uL (ref 4.0–10.5)

## 2020-04-29 LAB — LIPID PANEL
Cholesterol: 112 mg/dL (ref 0–200)
HDL: 35.9 mg/dL — ABNORMAL LOW (ref >39.00)
LDL Cholesterol: 55 mg/dL (ref 0–99)
NonHDL: 76.06
Total CHOL/HDL Ratio: 3
Triglycerides: 107 mg/dL (ref 0.0–149.0)
VLDL: 21.4 mg/dL (ref 0.0–40.0)

## 2020-04-29 LAB — BASIC METABOLIC PANEL
BUN: 7 mg/dL (ref 6–23)
CO2: 28 mEq/L (ref 19–32)
Calcium: 9 mg/dL (ref 8.4–10.5)
Chloride: 99 mEq/L (ref 96–112)
Creatinine, Ser: 0.94 mg/dL (ref 0.40–1.50)
GFR: 77.23 mL/min (ref >60.00)
Glucose, Bld: 98 mg/dL (ref 70–99)
Potassium: 3.7 mEq/L (ref 3.5–5.1)
Sodium: 133 mEq/L — ABNORMAL LOW (ref 135–145)

## 2020-04-29 LAB — HEPATIC FUNCTION PANEL
ALT: 23 U/L (ref 0–53)
AST: 28 U/L (ref 0–37)
Albumin: 3.8 g/dL (ref 3.5–5.2)
Alkaline Phosphatase: 103 U/L (ref 39–117)
Bilirubin, Direct: 0.1 mg/dL (ref 0.0–0.3)
Total Bilirubin: 0.6 mg/dL (ref 0.2–1.2)
Total Protein: 6.7 g/dL (ref 6.0–8.3)

## 2020-04-29 LAB — TSH: TSH: 8.78 u[IU]/mL — ABNORMAL HIGH (ref 0.35–4.50)

## 2020-04-29 LAB — HEMOGLOBIN A1C: Hgb A1c MFr Bld: 6 % (ref 4.6–6.5)

## 2020-04-29 LAB — FERRITIN: Ferritin: 350.7 ng/mL — ABNORMAL HIGH (ref 22.0–322.0)

## 2020-04-29 NOTE — Progress Notes (Addendum)
Patient ID: Daniel Austin, male   DOB: 16-May-1941, 79 y.o.   MRN: [id number]   Subjective:    Patient ID: Daniel Austin, male    DOB: Jun 03, 1941, 79 y.o.   MRN: [id number]  HPI This visit occurred during the SARS-CoV-2 public health emergency.  Safety protocols were in place, including screening questions prior to the visit, additional usage of staff PPE, and extensive cleaning of exam room while observing appropriate contact time as indicated for disinfecting solutions.  Patient here as a work in appt with concerns regarding falling and weakness.  Was seen in ER ([facility]) 04/21/20.  He is accompanied by his wife.  History obtained from both of them.  He reported he was sitting on the side of the bed and slipped off the bed.  Was evaluated by EMS.  Reported - pacemaker "not right".  To [facility] ER.  Was not evaluated.  Had pacemaker representative  recheck pacemaker and was told pacemaker ok.  Continued to have weakness.  To [facility] ER.  Sodium low.  Blood pressure varying.  Was given IVFs.  Sodium recheck improved.  Discharged from ER.  He reports he is feeling better.  Eating.  No nausea or vomiting.  No headache since ER.  (had headache in ER - resolved with tylenol.  He relates to not eating).  No chest pain.  No increased sob.  No abdominal pain.  Bowels moving.  No other falls since ER evaluation.  Discussed physical therapy.  He is in agreement.  Wants to go to [place].    Past Medical History:  Diagnosis Date  . Anemia   . Anxiety   . Arthritis   . CAD (coronary artery disease)   . Cardiomyopathy ([place])   . Carotid artery stenosis    high-grade symptomatic right sided s/p stent twice  . CHF (congestive heart failure) (HCC)    chronic movement fluid and history of CHF he noticed in the   . Depression   . Fractures, multiple    Metal rod in left foot  . GERD (gastroesophageal reflux disease)   . Heart block   . Hypercholesterolemia   . Hyperglycemia   . Hypertension   . Hypothyroidism   .  Knee pain   . Presence of permanent cardiac pacemaker   . Shortness of breath dyspnea   . Stroke [facility])    clot in neck broke off left side weakness 2008  resolved  . Syncope    second degree heart block s/p St Jude's pacemaker placement 12/08 with his history is limited with any  . Ulcer of gastric fundus    Past Surgical History:  Procedure Laterality Date  . CARDIAC CATHETERIZATION    . CORONARY ANGIOPLASTY     stents x 3  . heart stint  11/04/2014   [facility]  . HERNIA REPAIR     inguinal  . INSERT / REPLACE / REMOVE PACEMAKER    . JOINT REPLACEMENT     right knee  . KNEE ARTHROPLASTY Right 02/08/2016   Procedure: COMPUTER ASSISTED TOTAL KNEE ARTHROPLASTY;  Surgeon: Dereck Leep, MD;  Location: [facility] ORS;  Service: Orthopedics;  Laterality: Right;  . KNEE ARTHROPLASTY Left 06/27/2016   Procedure: COMPUTER ASSISTED TOTAL KNEE ARTHROPLASTY;  Surgeon: Dereck Leep, MD;  Location: [facility] ORS;  Service: Orthopedics;  Laterality: Left;  . PACEMAKER INSERTION  08/2007  . PACEMAKER INSERTION N/A 11/16/2015   Procedure: INSERTION PACEMAKER/ PACEMAKER CHANGE OUT;  Surgeon: Isaias Cowman, MD;  Location: [facility] ORS;  Service: Cardiovascular;  Laterality: N/A;  . PERCUTANEOUS PLACEMENT INTRAVASCULAR STENT CERVICAL CAROTID ARTERY     twice, right carotid artery   Family History  Problem Relation Age of Onset  . Heart disease Mother        CHF  . Heart disease Father        CHF  . Thyroid disease Father        hypothyroidism  . Colon polyps Father   . Diabetes Sister        diet controlled  . Thyroid disease Sister   . Cancer Sister        breast   . Thyroid disease Sister   . Cancer Sister        breast  . Colon cancer Neg Hx   . Prostate cancer Neg Hx    Social History   Socioeconomic History  . Marital status: Married    Spouse name: Not on file  . Number of children: 1  . Years of education: Not on file  . Highest education level: Not on file  Occupational  History  . Not on file  Tobacco Use  . Smoking status: Never Smoker  . Smokeless tobacco: Never Used  Vaping Use  . Vaping Use: Never used  Substance and Sexual Activity  . Alcohol use: No    Alcohol/week: 0.0 standard drinks  . Drug use: No  . Sexual activity: Not Currently  Other Topics Concern  . Not on file  Social History Narrative  . Not on file   Social Determinants of Health   Financial Resource Strain:   . Difficulty of Paying Living Expenses:   Food Insecurity:   . Worried About Charity fundraiser in the Last Year:   . Arboriculturist in the Last Year:   Transportation Needs:   . Film/video editor (Medical):   Marland Kitchen Lack of Transportation (Non-Medical):   Physical Activity:   . Days of Exercise per Week:   . Minutes of Exercise per Session:   Stress:   . Feeling of Stress :   Social Connections:   . Frequency of Communication with Friends and Family:   . Frequency of Social Gatherings with Friends and Family:   . Attends Religious Services:   . Active Member of Clubs or Organizations:   . Attends Archivist Meetings:   Marland Kitchen Marital Status:     Outpatient Encounter Medications as of 04/29/2020  Medication Sig  . acetaminophen (TYLENOL) 500 MG tablet Take 1,000 mg by mouth every 6 (six) hours as needed.  Marland Kitchen amLODipine (NORVASC) 2.5 MG tablet TAKE 1 TABLET BY MOUTH EVERY DAY  . Aspirin 81 MG EC tablet Take 81 mg by mouth daily.  . citalopram (CELEXA) 20 MG tablet TAKE 1 TABLET BY MOUTH EVERY DAY  . clopidogrel (PLAVIX) 75 MG tablet TAKE 1 TABLET BY MOUTH EVERY DAY  . fexofenadine (ALLEGRA) 180 MG tablet Take 180 mg by mouth daily. As needed  . levothyroxine (SYNTHROID) 112 MCG tablet TAKE 1 TABLET (112 MCG TOTAL) BY MOUTH DAILY BEFORE BREAKFAST.  Marland Kitchen lisinopril (ZESTRIL) 40 MG tablet TAKE 1 TABLET BY MOUTH EVERY DAY  . LORazepam (ATIVAN) 1 MG tablet TAKE 1 TABLET BY MOUTH EVERY DAY AT BEDTIME AS NEEDED  . metoprolol tartrate (LOPRESSOR) 25 MG tablet TAKE  1 TABLET BY MOUTH TWICE A DAY  . naproxen sodium (ANAPROX) 220 MG tablet Take by mouth.  . nitroGLYCERIN (NITROSTAT) 0.4 MG  SL tablet Place 0.4 mg under the tongue every 5 (five) minutes as needed. If a third tablet is needed please call 911.  . pantoprazole (PROTONIX) 40 MG tablet TAKE 1 TABLET BY MOUTH EVERY DAY  . Probiotic Product (ALIGN) 4 MG CAPS One tablet per day  . rosuvastatin (CRESTOR) 20 MG tablet TAKE 1 TABLET BY MOUTH EVERY DAY AT NIGHT  . triamcinolone (NASACORT) 55 MCG/ACT AERO nasal inhaler Place into the nose.  . triamcinolone cream (KENALOG) 0.1 % Apply 1 application topically 2 (two) times daily.   No facility-administered encounter medications on file as of 04/29/2020.    Review of Systems  Constitutional: Negative for appetite change and unexpected weight change.  HENT: Negative for congestion and sinus pressure.   Respiratory: Negative for cough, chest tightness and shortness of breath.   Cardiovascular: Negative for chest pain, palpitations and leg swelling.  Gastrointestinal: Negative for abdominal pain, diarrhea and nausea.  Genitourinary: Negative for difficulty urinating and dysuria.  Musculoskeletal: Negative for joint swelling and myalgias.  Skin: Negative for color change and rash.  Neurological: Negative for dizziness and light-headedness.       No headache now and has not been an issue for him.   Psychiatric/Behavioral: Negative for agitation and dysphoric mood.       Objective:    Physical Exam Vitals reviewed.  Constitutional:      General: He is not in acute distress.    Appearance: Normal appearance. He is well-developed.  HENT:     Head: Normocephalic and atraumatic.     Right Ear: External ear normal.     Left Ear: External ear normal.  Eyes:     General: No scleral icterus.       Right eye: No discharge.        Left eye: No discharge.     Conjunctiva/sclera: Conjunctivae normal.  Cardiovascular:     Rate and Rhythm: Normal rate and  regular rhythm.  Pulmonary:     Effort: Pulmonary effort is normal. No respiratory distress.     Breath sounds: Normal breath sounds.  Abdominal:     General: Bowel sounds are normal.     Palpations: Abdomen is soft.     Tenderness: There is no abdominal tenderness.  Musculoskeletal:        General: No swelling or tenderness.     Cervical back: Neck supple. No tenderness.  Lymphadenopathy:     Cervical: No cervical adenopathy.  Skin:    Findings: No erythema or rash.  Neurological:     Mental Status: He is alert.  Psychiatric:        Mood and Affect: Mood normal.        Behavior: Behavior normal.    Not orthostatic on exam.   BP 120/70   Pulse 74   Temp (!) 97.5 F (36.4 C)   Resp 16   Ht '5\' 11"'  (1.803 m)   Wt 197 lb 3.2 oz (89.4 kg)   SpO2 98%   BMI 27.50 kg/m  Wt Readings from Last 3 Encounters:  04/29/20 197 lb 3.2 oz (89.4 kg)  04/20/20 193 lb (87.5 kg)  02/05/20 201 lb (91.2 kg)     Lab Results  Component Value Date   WBC 5.2 04/29/2020   HGB 12.0 (L) 04/29/2020   HCT 35.5 (L) 04/29/2020   PLT 422.0 (H) 04/29/2020   GLUCOSE 98 04/29/2020   CHOL 112 04/29/2020   TRIG 107.0 04/29/2020   HDL 35.90 (L) 04/29/2020  LDLCALC 55 04/29/2020   ALT 23 04/29/2020   AST 28 04/29/2020   NA 133 (L) 04/29/2020   K 3.7 04/29/2020   CL 99 04/29/2020   CREATININE 0.94 04/29/2020   BUN 7 04/29/2020   CO2 28 04/29/2020   TSH 8.78 (H) 04/29/2020   PSA 0.41 04/18/2018   INR 1.13 06/11/2016   HGBA1C 6.0 04/29/2020   MICROALBUR 1.6 05/25/2014       Assessment & Plan:   Problem List Items Addressed This Visit    Anemia    Had previous GI w/up.  Follow cbc.        Benign essential HTN    Blood pressure as outlined.  Continue lisinopril and metoprolol.  Follow pressures.  Follow metabolic panel.        Carotid artery stenosis    Followed by [facility].  Last evaluated 03/2019.  Stable.  Recommended f/u in one year.        Coronary atherosclerosis of native  coronary artery    Stable.  Continue risk factor modification.  Continue crestor, metoprolol and lisinopril.  Followed by cardiology.        Essential hypertension, benign    Blood pressure as outlined.  Not orthostatic on exam.  Follow pressures.  Follow metabolic panel.       HLD (hyperlipidemia)    On crestor.  Low cholesterol diet and exercise.  Follow lipid panel and liver function tests.        Hyperglycemia    Low carb diet and exercise.  Follow met b and a1c.       Hyponatremia    Sodium slightly decreased in ER.  Given IVFs with noted improvement.  Recheck sodium today to confirm stable.        Hypothyroidism    On thyroid replacement.  Follow tsh.       Relevant Orders   TSH (Completed)   Pure hypercholesterolemia    On crestor.  Low cholesterol diet and exercise.  Follow lipid panel and liver function tests.        Stress    Discussed with him today.  He reports some increased mild depression in afternoon.  States feels find during the day.  Denies any significant depression.  No suicidal ideations.  On citalopram.  Follow.        Weakness    Recent fall.  Weakness. Discussed PT to evaluate and treat to help with strengthening, gait, etc.       Relevant Orders   Ambulatory referral to Physical Therapy       Einar Pheasant, MD

## 2020-04-30 ENCOUNTER — Other Ambulatory Visit: Payer: Self-pay | Admitting: Internal Medicine

## 2020-04-30 ENCOUNTER — Encounter: Payer: Self-pay | Admitting: Internal Medicine

## 2020-04-30 DIAGNOSIS — E871 Hypo-osmolality and hyponatremia: Secondary | ICD-10-CM

## 2020-04-30 DIAGNOSIS — R531 Weakness: Secondary | ICD-10-CM | POA: Insufficient documentation

## 2020-04-30 DIAGNOSIS — R7989 Other specified abnormal findings of blood chemistry: Secondary | ICD-10-CM

## 2020-04-30 NOTE — Progress Notes (Signed)
Order placed for f/u labs.  

## 2020-04-30 NOTE — Assessment & Plan Note (Signed)
Stable.  Continue risk factor modification.  Continue crestor, metoprolol and lisinopril.  Followed by cardiology.

## 2020-04-30 NOTE — Assessment & Plan Note (Signed)
On crestor.  Low cholesterol diet and exercise.  Follow lipid panel and liver function tests.   

## 2020-04-30 NOTE — Assessment & Plan Note (Signed)
On thyroid replacement.  Follow tsh.  

## 2020-04-30 NOTE — Assessment & Plan Note (Signed)
Followed by [facility].  Last evaluated 03/2019.  Stable.  Recommended f/u in one year.

## 2020-04-30 NOTE — Assessment & Plan Note (Signed)
Discussed with him today.  He reports some increased mild depression in afternoon.  States feels find during the day.  Denies any significant depression.  No suicidal ideations.  On citalopram.  Follow.

## 2020-04-30 NOTE — Assessment & Plan Note (Signed)
Sodium slightly decreased in ER.  Given IVFs with noted improvement.  Recheck sodium today to confirm stable.

## 2020-04-30 NOTE — Assessment & Plan Note (Signed)
Had previous GI w/up.  Follow [facility]bc.

## 2020-04-30 NOTE — Assessment & Plan Note (Signed)
Recent fall.  Weakness. Discussed PT to evaluate and treat to help with strengthening, gait, etc.

## 2020-04-30 NOTE — Assessment & Plan Note (Signed)
Low carb diet and exercise.  Follow met b and a1c.  

## 2020-04-30 NOTE — Assessment & Plan Note (Signed)
Blood pressure as outlined.  Not orthostatic on exam.  Follow pressures.  Follow metabolic panel.

## 2020-04-30 NOTE — Assessment & Plan Note (Signed)
Blood pressure as outlined.  Continue lisinopril and metoprolol.  Follow pressures.  Follow metabolic panel.  

## 2020-04-30 NOTE — Addendum Note (Signed)
Addended by: Alisa Graff on: 04/30/2020 11:56 AM   Modules accepted: Orders

## 2020-05-02 ENCOUNTER — Ambulatory Visit: Payer: Medicare Other | Admitting: Internal Medicine

## 2020-05-02 ENCOUNTER — Ambulatory Visit: Payer: Medicare Other | Admitting: Podiatry

## 2020-05-02 ENCOUNTER — Encounter: Payer: Self-pay | Admitting: Podiatry

## 2020-05-02 ENCOUNTER — Other Ambulatory Visit: Payer: Self-pay

## 2020-05-02 DIAGNOSIS — D689 Coagulation defect, unspecified: Secondary | ICD-10-CM

## 2020-05-02 DIAGNOSIS — B351 Tinea unguium: Secondary | ICD-10-CM

## 2020-05-02 NOTE — Progress Notes (Signed)
This patient returns to my office for at risk foot care.  This patient requires this care by a professional since this patient will be at risk due to having coagulation defect due to plavix.   This patient is unable to cut nails himself since the patient cannot reach his nails.These nails are painful walking and wearing shoes.  This patient presents for at risk foot care today.  General Appearance  Alert, conversant and in no acute stress.  Vascular  Dorsalis pedis and posterior tibial  pulses are palpable  bilaterally.  Capillary return is within normal limits  bilaterally. Temperature is within normal limits  bilaterally.  Neurologic  Senn-Weinstein monofilament wire test within normal limits  bilaterally. Muscle power within normal limits bilaterally.  Nails Thick disfigured discolored nails with subungual debris  from hallux to fifth toes bilaterally. No evidence of bacterial infection or drainage bilaterally.  Orthopedic  No limitations of motion  feet .  No crepitus or effusions noted.  No bony pathology or digital deformities noted.  HAV  B/L.  Skin  normotropic skin with no porokeratosis noted bilaterally.  No signs of infections or ulcers noted.  Asymptomatic callus 1st MPJ  Right foot.   Onychomycosis  Pain in right toes  Pain in left toes  Consent was obtained for treatment procedures.   Mechanical debridement of nails 1-5  bilaterally performed with a nail nipper.  Filed with dremel without incident.    Return office visit   3 months                   Told patient to return for periodic foot care and evaluation due to potential at risk complications.   Gardiner Barefoot DPM

## 2020-05-12 ENCOUNTER — Other Ambulatory Visit: Payer: Self-pay

## 2020-05-12 ENCOUNTER — Other Ambulatory Visit (INDEPENDENT_AMBULATORY_CARE_PROVIDER_SITE_OTHER): Payer: Medicare Other

## 2020-05-12 DIAGNOSIS — E871 Hypo-osmolality and hyponatremia: Secondary | ICD-10-CM | POA: Diagnosis not present

## 2020-05-12 DIAGNOSIS — R7989 Other specified abnormal findings of blood chemistry: Secondary | ICD-10-CM | POA: Diagnosis not present

## 2020-05-12 LAB — IBC + FERRITIN
Ferritin: 214.3 ng/mL (ref 22.0–322.0)
Iron: 94 ug/dL (ref 42–165)
Saturation Ratios: 36.5 % (ref 20.0–50.0)
Transferrin: 184 mg/dL — ABNORMAL LOW (ref 212.0–360.0)

## 2020-05-12 LAB — SODIUM: Sodium: 136 mEq/L (ref 135–145)

## 2020-05-15 ENCOUNTER — Other Ambulatory Visit: Payer: Self-pay | Admitting: Internal Medicine

## 2020-05-17 NOTE — Telephone Encounter (Signed)
rx ok'd for lorazepam #30 with one refill.

## 2020-05-19 ENCOUNTER — Other Ambulatory Visit: Payer: Self-pay

## 2020-05-19 MED ORDER — LEVOTHYROXINE SODIUM 125 MCG PO TABS: 125.0000 ug | ORAL_TABLET | Freq: Every day | ORAL | 1 refills | Status: DC

## 2020-05-23 ENCOUNTER — Ambulatory Visit: Payer: Medicare Other | Admitting: Internal Medicine

## 2020-05-24 ENCOUNTER — Ambulatory Visit (INDEPENDENT_AMBULATORY_CARE_PROVIDER_SITE_OTHER): Payer: Medicare Other

## 2020-05-24 ENCOUNTER — Ambulatory Visit (INDEPENDENT_AMBULATORY_CARE_PROVIDER_SITE_OTHER): Payer: Medicare Other | Admitting: Vascular Surgery

## 2020-05-24 ENCOUNTER — Encounter (INDEPENDENT_AMBULATORY_CARE_PROVIDER_SITE_OTHER): Payer: Self-pay | Admitting: Vascular Surgery

## 2020-05-24 ENCOUNTER — Other Ambulatory Visit: Payer: Self-pay

## 2020-05-24 VITALS — BP 134/80 | HR 77 | Ht 71.0 in | Wt 196.0 lb

## 2020-05-24 DIAGNOSIS — I6523 Occlusion and stenosis of bilateral carotid arteries: Secondary | ICD-10-CM

## 2020-05-24 DIAGNOSIS — E785 Hyperlipidemia, unspecified: Secondary | ICD-10-CM | POA: Diagnosis not present

## 2020-05-24 DIAGNOSIS — I10 Essential (primary) hypertension: Secondary | ICD-10-CM

## 2020-05-24 NOTE — Progress Notes (Signed)
MRN : [id number]  Daniel Austin is a 79 y.o. (24-Jul-1941) male who presents with chief complaint of  Chief Complaint  Patient presents with  . Follow-up    1 year F/U  .  History of Present Illness: Patient returns in follow-up of his carotid disease.  He is doing well today without any focal neurologic symptoms or complaints.  He is well over a decade status post right carotid stent placement.  Duplex today shows a widely patent right carotid stent with 1 to 39% left ICA stenosis and no significant progression from previous studies.  Current Outpatient Medications  Medication Sig Dispense Refill  . acetaminophen (TYLENOL) 500 MG tablet Take 1,000 mg by mouth every 6 (six) hours as needed.    Marland Kitchen amLODipine (NORVASC) 2.5 MG tablet TAKE 1 TABLET BY MOUTH EVERY DAY 90 tablet 1  . Aspirin 81 MG EC tablet Take 81 mg by mouth daily.    . citalopram (CELEXA) 20 MG tablet TAKE 1 TABLET BY MOUTH EVERY DAY 90 tablet 2  . clopidogrel (PLAVIX) 75 MG tablet TAKE 1 TABLET BY MOUTH EVERY DAY 90 tablet 1  . fexofenadine (ALLEGRA) 180 MG tablet Take 180 mg by mouth daily. As needed    . levothyroxine (SYNTHROID) 125 MCG tablet Take 1 tablet (125 mcg total) by mouth daily before breakfast. 90 tablet 1  . lisinopril (ZESTRIL) 40 MG tablet TAKE 1 TABLET BY MOUTH EVERY DAY 90 tablet 1  . LORazepam (ATIVAN) 1 MG tablet TAKE 1 TABLET BY MOUTH EVERY DAY AT BEDTIME AS NEEDED 30 tablet 1  . metoprolol tartrate (LOPRESSOR) 25 MG tablet TAKE 1 TABLET BY MOUTH TWICE A DAY 180 tablet 1  . naproxen sodium (ANAPROX) 220 MG tablet Take by mouth.    . nitroGLYCERIN (NITROSTAT) 0.4 MG SL tablet Place 0.4 mg under the tongue every 5 (five) minutes as needed. If a third tablet is needed please call 911.    . pantoprazole (PROTONIX) 40 MG tablet TAKE 1 TABLET BY MOUTH EVERY DAY 90 tablet 2  . Probiotic Product (ALIGN) 4 MG CAPS One tablet per day 30 capsule 0  . rosuvastatin (CRESTOR) 20 MG tablet TAKE 1 TABLET BY MOUTH  EVERY DAY AT NIGHT 90 tablet 2  . triamcinolone (NASACORT) 55 MCG/ACT AERO nasal inhaler Place into the nose.    . triamcinolone cream (KENALOG) 0.1 % Apply 1 application topically 2 (two) times daily. 30 g 0   No current facility-administered medications for this visit.    Past Medical History:  Diagnosis Date  . Anemia   . Anxiety   . Arthritis   . CAD (coronary artery disease)   . Cardiomyopathy ([place])   . Carotid artery stenosis    high-grade symptomatic right sided s/p stent twice  . CHF (congestive heart failure) (HCC)    chronic movement fluid and history of CHF he noticed in the   . Depression   . Fractures, multiple    Metal rod in left foot  . GERD (gastroesophageal reflux disease)   . Heart block   . Hypercholesterolemia   . Hyperglycemia   . Hypertension   . Hypothyroidism   . Knee pain   . Presence of permanent cardiac pacemaker   . Shortness of breath dyspnea   . Stroke [facility])    clot in neck broke off left side weakness 2008  resolved  . Syncope    second degree heart block s/p St Jude's pacemaker placement 12/08 with his  history is limited with any  . Ulcer of gastric fundus     Past Surgical History:  Procedure Laterality Date  . CARDIAC CATHETERIZATION    . CORONARY ANGIOPLASTY     stents x 3  . heart stint  11/04/2014   [facility]  . HERNIA REPAIR     inguinal  . INSERT / REPLACE / REMOVE PACEMAKER    . JOINT REPLACEMENT     right knee  . KNEE ARTHROPLASTY Right 02/08/2016   Procedure: COMPUTER ASSISTED TOTAL KNEE ARTHROPLASTY;  Surgeon: Dereck Leep, MD;  Location: [facility] ORS;  Service: Orthopedics;  Laterality: Right;  . KNEE ARTHROPLASTY Left 06/27/2016   Procedure: COMPUTER ASSISTED TOTAL KNEE ARTHROPLASTY;  Surgeon: Dereck Leep, MD;  Location: [facility] ORS;  Service: Orthopedics;  Laterality: Left;  . PACEMAKER INSERTION  08/2007  . PACEMAKER INSERTION N/A 11/16/2015   Procedure: INSERTION PACEMAKER/ PACEMAKER CHANGE OUT;  Surgeon: Isaias Cowman, MD;  Location: [facility] ORS;  Service: Cardiovascular;  Laterality: N/A;  . PERCUTANEOUS PLACEMENT INTRAVASCULAR STENT CERVICAL CAROTID ARTERY     twice, right carotid artery     Social History   Tobacco Use  . Smoking status: Never Smoker  . Smokeless tobacco: Never Used  Vaping Use  . Vaping Use: Never used  Substance Use Topics  . Alcohol use: No    Alcohol/week: 0.0 standard drinks  . Drug use: No     Family History  Problem Relation Age of Onset  . Heart disease Mother        CHF  . Heart disease Father        CHF  . Thyroid disease Father        hypothyroidism  . Colon polyps Father   . Diabetes Sister        diet controlled  . Thyroid disease Sister   . Cancer Sister        breast   . Thyroid disease Sister   . Cancer Sister        breast  . Colon cancer Neg Hx   . Prostate cancer Neg Hx     Allergies  Allergen Reactions  . Beta Adrenergic Blockers Other (See Comments)    Second Degree Heart Block  . Lipitor [Atorvastatin] Other (See Comments)    Myalgia Other Reaction: myalgia Myalgia    REVIEW OF SYSTEMS (Negative unless checked)  Constitutional: [] ?Weight loss  [] ?Fever  [] ?Chills Cardiac: [] ?Chest pain   [] ?Chest pressure   [] ?Palpitations   [] ?Shortness of breath when laying flat   [] ?Shortness of breath at rest   [] ?Shortness of breath with exertion. Vascular:  [] ?Pain in legs with walking   [] ?Pain in legs at rest   [] ?Pain in legs when laying flat   [] ?Claudication   [] ?Pain in feet when walking  [] ?Pain in feet at rest  [] ?Pain in feet when laying flat   [] ?History of DVT   [] ?Phlebitis   [] ?Swelling in legs   [] ?Varicose veins   [] ?Non-healing ulcers Pulmonary:   [] ?Uses home oxygen   [] ?Productive cough   [] ?Hemoptysis   [] ?Wheeze  [] ?COPD   [] ?Asthma Neurologic:  [] ?Dizziness  [] ?Blackouts   [] ?Seizures   [] ?History of stroke   [x] ?History of TIA  [] ?Aphasia   [] ?Temporary blindness   [] ?Dysphagia   [x] ?Weakness or numbness in arms    [] ?Weakness or numbness in legs Musculoskeletal:  [x] ?Arthritis   [] ?Joint swelling   [] ?Joint pain   [] ?Low back pain Hematologic:  [] ?Easy bruising  [] ?  Easy bleeding   [] ?Hypercoagulable state   [] ?Anemic  [] ?Hepatitis Gastrointestinal:  [] ?Blood in stool   [] ?Vomiting blood  [] ?Gastroesophageal reflux/heartburn   [] ?Difficulty swallowing. Genitourinary:  [] ?Chronic kidney disease   [] ?Difficult urination  [] ?Frequent urination  [] ?Burning with urination   [] ?Blood in urine Skin:  [] ?Rashes   [] ?Ulcers   [] ?Wounds Psychological:  [] ?History of anxiety   [] ? History of major depression.  Physical Examination  Vitals:   05/24/20 1348  BP: 134/80  Pulse: 77  Weight: 196 lb (88.9 kg)  Height: 5\' 11"  (1.803 m)   Body mass index is 27.34 kg/m. Gen:  WD/WN, NAD Head: [place]/AT, No temporalis wasting. Ear/Nose/Throat: Hearing grossly intact, nares w/o erythema or drainage, trachea midline Eyes: Conjunctiva clear. Sclera non-icteric Neck: Supple.  No bruit  Pulmonary:  Good air movement, equal and clear to auscultation bilaterally.  Cardiac: RRR, No JVD Vascular:  Vessel Right Left  Radial Palpable Palpable       Musculoskeletal: M/S 5/5 throughout.  No deformity or atrophy. No edema. Neurologic: CN 2-12 intact. Sensation grossly intact in extremities.  Symmetrical.  Speech is fluent. Motor exam as listed above. Psychiatric: Judgment intact, Mood & affect appropriate for pt's clinical situation. Dermatologic: No rashes or ulcers noted.  No cellulitis or open wounds.      CBC Lab Results  Component Value Date   WBC 5.2 04/29/2020   HGB 12.0 (L) 04/29/2020   HCT 35.5 (L) 04/29/2020   MCV 89.6 04/29/2020   PLT 422.0 (H) 04/29/2020    BMET    Component Value Date/Time   NA 136 05/12/2020 0940   K 3.7 04/29/2020 1217   CL 99 04/29/2020 1217   CO2 28 04/29/2020 1217   GLUCOSE 98 04/29/2020 1217   BUN 7 04/29/2020 1217   CREATININE 0.94 04/29/2020 1217   CALCIUM 9.0  04/29/2020 1217   GFRNONAA >60 04/20/2020 1816   GFRAA >60 04/20/2020 1816   CrCl cannot be calculated (Patient's most recent lab result is older than the maximum 21 days allowed.).  COAG Lab Results  Component Value Date   INR 1.13 06/11/2016   INR 1.14 01/25/2016   INR 1.08 11/09/2015    Radiology No results found.   Assessment/Plan HLD (hyperlipidemia) lipid control important in reducing the progression of atherosclerotic disease. Continue statin therapy   Benign essential HTN blood pressure control important in reducing the progression of atherosclerotic disease. On appropriate oral medications.  Carotid artery stenosis Duplex shows a patent right carotid artery stent in 1 to 39% left ICA stenosis with no significant progression from previous studies.  Doing well.  Continue current medical regimen.  Recheck in 1 year.    Leotis Pain, MD  05/24/2020 2:42 PM    This note was created with Dragon medical transcription system.  Any errors from dictation are purely unintentional

## 2020-05-24 NOTE — Assessment & Plan Note (Signed)
Duplex shows a patent right carotid artery stent in 1 to 39% left ICA stenosis with no significant progression from previous studies.  Doing well.  Continue current medical regimen.  Recheck in 1 year.

## 2020-06-01 DIAGNOSIS — I10 Essential (primary) hypertension: Secondary | ICD-10-CM | POA: Diagnosis not present

## 2020-06-01 DIAGNOSIS — I459 Conduction disorder, unspecified: Secondary | ICD-10-CM | POA: Diagnosis not present

## 2020-06-01 DIAGNOSIS — I472 Ventricular tachycardia: Secondary | ICD-10-CM | POA: Diagnosis not present

## 2020-06-01 DIAGNOSIS — I5022 Chronic systolic (congestive) heart failure: Secondary | ICD-10-CM | POA: Diagnosis not present

## 2020-06-01 DIAGNOSIS — Z23 Encounter for immunization: Secondary | ICD-10-CM | POA: Diagnosis not present

## 2020-06-14 ENCOUNTER — Encounter: Payer: Self-pay | Admitting: Internal Medicine

## 2020-06-14 ENCOUNTER — Other Ambulatory Visit: Payer: Self-pay

## 2020-06-14 ENCOUNTER — Ambulatory Visit (INDEPENDENT_AMBULATORY_CARE_PROVIDER_SITE_OTHER): Payer: Medicare Other | Admitting: Internal Medicine

## 2020-06-14 VITALS — BP 130/70 | HR 90 | Temp 98.2°F | Resp 16 | Ht 71.0 in | Wt 198.2 lb

## 2020-06-14 DIAGNOSIS — R531 Weakness: Secondary | ICD-10-CM | POA: Diagnosis not present

## 2020-06-14 DIAGNOSIS — I6523 Occlusion and stenosis of bilateral carotid arteries: Secondary | ICD-10-CM

## 2020-06-14 DIAGNOSIS — E7800 Pure hypercholesterolemia, unspecified: Secondary | ICD-10-CM | POA: Diagnosis not present

## 2020-06-14 DIAGNOSIS — R739 Hyperglycemia, unspecified: Secondary | ICD-10-CM

## 2020-06-14 DIAGNOSIS — Z23 Encounter for immunization: Secondary | ICD-10-CM

## 2020-06-14 DIAGNOSIS — I2510 Atherosclerotic heart disease of native coronary artery without angina pectoris: Secondary | ICD-10-CM

## 2020-06-14 DIAGNOSIS — E039 Hypothyroidism, unspecified: Secondary | ICD-10-CM | POA: Diagnosis not present

## 2020-06-14 DIAGNOSIS — I10 Essential (primary) hypertension: Secondary | ICD-10-CM

## 2020-06-14 DIAGNOSIS — D649 Anemia, unspecified: Secondary | ICD-10-CM

## 2020-06-14 DIAGNOSIS — F439 Reaction to severe stress, unspecified: Secondary | ICD-10-CM

## 2020-06-14 NOTE — Progress Notes (Signed)
Patient ID: ZYMARION FAVORITE, male   DOB: May 29, 1941, 79 y.o.   MRN: [id number]   Subjective:    Patient ID: JASTIN FORE, male    DOB: 15-Oct-1940, 79 y.o.   MRN: [id number]  HPI This visit occurred during the SARS-CoV-2 public health emergency.  Safety protocols were in place, including screening questions prior to the visit, additional usage of staff PPE, and extensive cleaning of exam room while observing appropriate contact time as indicated for disinfecting solutions.  Patient here for a scheduled follow up.  Here to f/u regarding his blood pressure..  Also recently had syncopal episode.  Had extensive w/up.  Had f/u with cardiology 06/01/20.  Stable.  No further episodes. He feels good.  Tries to stay active.  No chest pain or sob.  Outside blood pressure readings reviewed - averaging 110-140/60-80.  No abdominal pain.  Bowels moving.  Saw [facility] 05/24/20.  Stable.  Recommended f/u in one year.  Discussed trial of slow taper of lorazepam.  He is agreeable.    Past Medical History:  Diagnosis Date  . Anemia   . Anxiety   . Arthritis   . CAD (coronary artery disease)   . Cardiomyopathy ([place])   . Carotid artery stenosis    high-grade symptomatic right sided s/p stent twice  . CHF (congestive heart failure) (HCC)    chronic movement fluid and history of CHF he noticed in the   . Depression   . Fractures, multiple    Metal rod in left foot  . GERD (gastroesophageal reflux disease)   . Heart block   . Hypercholesterolemia   . Hyperglycemia   . Hypertension   . Hypothyroidism   . Knee pain   . Presence of permanent cardiac pacemaker   . Shortness of breath dyspnea   . Stroke [facility])    clot in neck broke off left side weakness 2008  resolved  . Syncope    second degree heart block s/p St Jude's pacemaker placement 12/08 with his history is limited with any  . Ulcer of gastric fundus    Past Surgical History:  Procedure Laterality Date  . CARDIAC CATHETERIZATION    . CORONARY  ANGIOPLASTY     stents x 3  . heart stint  11/04/2014   [facility]  . HERNIA REPAIR     inguinal  . INSERT / REPLACE / REMOVE PACEMAKER    . JOINT REPLACEMENT     right knee  . KNEE ARTHROPLASTY Right 02/08/2016   Procedure: COMPUTER ASSISTED TOTAL KNEE ARTHROPLASTY;  Surgeon: Dereck Leep, MD;  Location: [facility] ORS;  Service: Orthopedics;  Laterality: Right;  . KNEE ARTHROPLASTY Left 06/27/2016   Procedure: COMPUTER ASSISTED TOTAL KNEE ARTHROPLASTY;  Surgeon: Dereck Leep, MD;  Location: [facility] ORS;  Service: Orthopedics;  Laterality: Left;  . PACEMAKER INSERTION  08/2007  . PACEMAKER INSERTION N/A 11/16/2015   Procedure: INSERTION PACEMAKER/ PACEMAKER CHANGE OUT;  Surgeon: Isaias Cowman, MD;  Location: [facility] ORS;  Service: Cardiovascular;  Laterality: N/A;  . PERCUTANEOUS PLACEMENT INTRAVASCULAR STENT CERVICAL CAROTID ARTERY     twice, right carotid artery   Family History  Problem Relation Age of Onset  . Heart disease Mother        CHF  . Heart disease Father        CHF  . Thyroid disease Father        hypothyroidism  . Colon polyps Father   . Diabetes Sister  diet controlled  . Thyroid disease Sister   . Cancer Sister        breast   . Thyroid disease Sister   . Cancer Sister        breast  . Colon cancer Neg Hx   . Prostate cancer Neg Hx    Social History   Socioeconomic History  . Marital status: Married    Spouse name: Not on file  . Number of children: 1  . Years of education: Not on file  . Highest education level: Not on file  Occupational History  . Not on file  Tobacco Use  . Smoking status: Never Smoker  . Smokeless tobacco: Never Used  Vaping Use  . Vaping Use: Never used  Substance and Sexual Activity  . Alcohol use: No    Alcohol/week: 0.0 standard drinks  . Drug use: No  . Sexual activity: Not Currently  Other Topics Concern  . Not on file  Social History Narrative  . Not on file   Social Determinants of Health   Financial  Resource Strain:   . Difficulty of Paying Living Expenses: Not on file  Food Insecurity:   . Worried About Charity fundraiser in the Last Year: Not on file  . Ran Out of Food in the Last Year: Not on file  Transportation Needs:   . Lack of Transportation (Medical): Not on file  . Lack of Transportation (Non-Medical): Not on file  Physical Activity:   . Days of Exercise per Week: Not on file  . Minutes of Exercise per Session: Not on file  Stress:   . Feeling of Stress : Not on file  Social Connections:   . Frequency of Communication with Friends and Family: Not on file  . Frequency of Social Gatherings with Friends and Family: Not on file  . Attends Religious Services: Not on file  . Active Member of Clubs or Organizations: Not on file  . Attends Archivist Meetings: Not on file  . Marital Status: Not on file    Outpatient Encounter Medications as of 06/14/2020  Medication Sig  . acetaminophen (TYLENOL) 500 MG tablet Take 1,000 mg by mouth every 6 (six) hours as needed.  Marland Kitchen amLODipine (NORVASC) 2.5 MG tablet TAKE 1 TABLET BY MOUTH EVERY DAY  . Aspirin 81 MG EC tablet Take 81 mg by mouth daily.  . citalopram (CELEXA) 20 MG tablet TAKE 1 TABLET BY MOUTH EVERY DAY  . clopidogrel (PLAVIX) 75 MG tablet TAKE 1 TABLET BY MOUTH EVERY DAY  . fexofenadine (ALLEGRA) 180 MG tablet Take 180 mg by mouth daily. As needed  . levothyroxine (SYNTHROID) 125 MCG tablet Take 1 tablet (125 mcg total) by mouth daily before breakfast.  . lisinopril (ZESTRIL) 40 MG tablet TAKE 1 TABLET BY MOUTH EVERY DAY  . LORazepam (ATIVAN) 1 MG tablet TAKE 1 TABLET BY MOUTH EVERY DAY AT BEDTIME AS NEEDED  . metoprolol tartrate (LOPRESSOR) 25 MG tablet TAKE 1 TABLET BY MOUTH TWICE A DAY  . naproxen sodium (ANAPROX) 220 MG tablet Take by mouth.  . nitroGLYCERIN (NITROSTAT) 0.4 MG SL tablet Place 0.4 mg under the tongue every 5 (five) minutes as needed. If a third tablet is needed please call 911.  . pantoprazole  (PROTONIX) 40 MG tablet TAKE 1 TABLET BY MOUTH EVERY DAY  . Probiotic Product (ALIGN) 4 MG CAPS One tablet per day  . rosuvastatin (CRESTOR) 20 MG tablet TAKE 1 TABLET BY MOUTH EVERY DAY AT  NIGHT  . triamcinolone (NASACORT) 55 MCG/ACT AERO nasal inhaler Place into the nose.  . triamcinolone cream (KENALOG) 0.1 % Apply 1 application topically 2 (two) times daily.   No facility-administered encounter medications on file as of 06/14/2020.    Review of Systems  Constitutional: Negative for appetite change and unexpected weight change.  HENT: Negative for congestion and sinus pressure.   Respiratory: Negative for cough, chest tightness and shortness of breath.   Cardiovascular: Negative for chest pain, palpitations and leg swelling.  Gastrointestinal: Negative for abdominal pain, diarrhea, nausea and vomiting.  Genitourinary: Negative for difficulty urinating and dysuria.  Musculoskeletal: Negative for joint swelling and myalgias.  Skin: Negative for color change and rash.  Neurological: Negative for dizziness, light-headedness and headaches.  Psychiatric/Behavioral: Negative for agitation and dysphoric mood.       Objective:    Physical Exam Constitutional:      General: He is not in acute distress.    Appearance: Normal appearance. He is well-developed.  HENT:     Head: Normocephalic and atraumatic.     Right Ear: External ear normal.     Left Ear: External ear normal.  Eyes:     General: No scleral icterus.       Right eye: No discharge.        Left eye: No discharge.     Conjunctiva/sclera: Conjunctivae normal.  Cardiovascular:     Rate and Rhythm: Normal rate and regular rhythm.  Pulmonary:     Effort: Pulmonary effort is normal. No respiratory distress.     Breath sounds: Normal breath sounds.  Abdominal:     General: Bowel sounds are normal.     Palpations: Abdomen is soft.     Tenderness: There is no abdominal tenderness.  Musculoskeletal:        General: No swelling  or tenderness.     Cervical back: Neck supple. No rigidity or tenderness.  Skin:    Findings: No erythema or rash.  Neurological:     Mental Status: He is alert.  Psychiatric:        Mood and Affect: Mood normal.        Behavior: Behavior normal.     BP 130/70   Pulse 90   Temp 98.2 F (36.8 C) (Oral)   Resp 16   Ht 5' 11" (1.803 m)   Wt 198 lb 3.2 oz (89.9 kg)   SpO2 98%   BMI 27.64 kg/m  Wt Readings from Last 3 Encounters:  06/14/20 198 lb 3.2 oz (89.9 kg)  05/24/20 196 lb (88.9 kg)  04/29/20 197 lb 3.2 oz (89.4 kg)     Lab Results  Component Value Date   WBC 5.2 04/29/2020   HGB 12.0 (L) 04/29/2020   HCT 35.5 (L) 04/29/2020   PLT 422.0 (H) 04/29/2020   GLUCOSE 98 04/29/2020   CHOL 112 04/29/2020   TRIG 107.0 04/29/2020   HDL 35.90 (L) 04/29/2020   LDLCALC 55 04/29/2020   ALT 23 04/29/2020   AST 28 04/29/2020   NA 136 05/12/2020   K 3.7 04/29/2020   CL 99 04/29/2020   CREATININE 0.94 04/29/2020   BUN 7 04/29/2020   CO2 28 04/29/2020   TSH 8.78 (H) 04/29/2020   PSA 0.41 04/18/2018   INR 1.13 06/11/2016   HGBA1C 6.0 04/29/2020   MICROALBUR 1.6 05/25/2014       Assessment & Plan:   Problem List Items Addressed This Visit    Weakness      Recent fall.  Had placed referral to PT to evaluate and treat - strengthening, help with gait, etc.  Check on referral status.       Stress    Overall he is doing relatively well.  Will start a slow taper - lorazepam.  Follow.  Continue celexa.       Pure hypercholesterolemia    On crestor.  Low cholesterol diet and exercise.  Follow lipid panel and liver function tests.        Relevant Orders   Hepatic function panel   Lipid panel   Hypothyroidism    On thyroid replacement.  Follow tsh.       Relevant Orders   TSH   Hyperglycemia    Low carb diet and exercise.  Follow met b and a1c.       Relevant Orders   Hemoglobin A1c   Essential hypertension, benign    Blood pressure doing well.  On 2.5mg  amlodipine and 40mg lisinopril.  Follow pressures.  Follow metabolic panel.       Relevant Orders   Basic metabolic panel   Coronary atherosclerosis of native coronary artery    Continue risk factor modification.  Continue crestor, metoprolol and lisinopril.  Followed by cardiology.       Carotid artery stenosis    Evaluated by [facility] 05/24/20.  Stable.  Recommended f/u in one year.       Anemia    Has had previous GI w/up.  Follow [facility].       Relevant Orders   CBC with Differential/Platelet   IBC + Ferritin    Other Visit Diagnoses    Need for immunization against influenza    -  Primary   Relevant Orders   Flu Vaccine QUAD High Dose(Fluad) (Completed)       Charlene Scott, MD 

## 2020-06-19 NOTE — Assessment & Plan Note (Signed)
On crestor.  Low cholesterol diet and exercise.  Follow lipid panel and liver function tests.   

## 2020-06-19 NOTE — Assessment & Plan Note (Signed)
Has had previous GI w/up.  Follow [facility].

## 2020-06-19 NOTE — Assessment & Plan Note (Signed)
Low carb diet and exercise.  Follow met b and a1c.  

## 2020-06-19 NOTE — Assessment & Plan Note (Signed)
Overall he is doing relatively well.  Will start a slow taper - lorazepam.  Follow.  Continue celexa.

## 2020-06-19 NOTE — Assessment & Plan Note (Signed)
Blood pressure doing well.  On 2.5mg  amlodipine and 40mg  lisinopril.  Follow pressures.  Follow metabolic panel.

## 2020-06-19 NOTE — Assessment & Plan Note (Signed)
Continue risk factor modification.  Continue crestor, metoprolol and lisinopril.  Followed by cardiology.  

## 2020-06-19 NOTE — Assessment & Plan Note (Signed)
On thyroid replacement.  Follow tsh.  

## 2020-06-19 NOTE — Assessment & Plan Note (Signed)
Evaluated by [facility] 05/24/20.  Stable.  Recommended f/u in one year.

## 2020-06-19 NOTE — Assessment & Plan Note (Signed)
Recent fall.  Had placed referral to PT to evaluate and treat - strengthening, help with gait, etc.  Check on referral status.

## 2020-06-24 ENCOUNTER — Telehealth: Payer: Self-pay

## 2020-06-24 NOTE — Telephone Encounter (Signed)
Pt called to inquire about physical therapy-please advise

## 2020-06-28 NOTE — Telephone Encounter (Signed)
Left detailed message for patient.

## 2020-07-06 DIAGNOSIS — I420 Dilated cardiomyopathy: Secondary | ICD-10-CM | POA: Diagnosis not present

## 2020-07-16 ENCOUNTER — Other Ambulatory Visit: Payer: Self-pay | Admitting: Internal Medicine

## 2020-07-26 DIAGNOSIS — H2513 Age-related nuclear cataract, bilateral: Secondary | ICD-10-CM | POA: Diagnosis not present

## 2020-07-30 ENCOUNTER — Other Ambulatory Visit: Payer: Self-pay | Admitting: Internal Medicine

## 2020-08-04 ENCOUNTER — Ambulatory Visit: Payer: Medicare Other | Admitting: Podiatry

## 2020-08-04 ENCOUNTER — Encounter: Payer: Self-pay | Admitting: Podiatry

## 2020-08-04 ENCOUNTER — Other Ambulatory Visit: Payer: Self-pay

## 2020-08-04 DIAGNOSIS — M21619 Bunion of unspecified foot: Secondary | ICD-10-CM | POA: Diagnosis not present

## 2020-08-04 DIAGNOSIS — B351 Tinea unguium: Secondary | ICD-10-CM

## 2020-08-04 DIAGNOSIS — D689 Coagulation defect, unspecified: Secondary | ICD-10-CM | POA: Diagnosis not present

## 2020-08-04 NOTE — Progress Notes (Signed)
This patient returns to my office for at risk foot care.  This patient requires this care by a professional since this patient will be at risk due to having coagulation defect due to plavix.   This patient is unable to cut nails himself since the patient cannot reach his nails.These nails are painful walking and wearing shoes.  This patient presents for at risk foot care today.  General Appearance  Alert, conversant and in no acute stress.  Vascular  Dorsalis pedis and posterior tibial  pulses are palpable  bilaterally.  Capillary return is within normal limits  bilaterally. Temperature is within normal limits  bilaterally.  Neurologic  Senn-Weinstein monofilament wire test within normal limits  bilaterally. Muscle power within normal limits bilaterally.  Nails Thick disfigured discolored nails with subungual debris  from hallux to fifth toes bilaterally. No evidence of bacterial infection or drainage bilaterally.  Orthopedic  No limitations of motion  feet .  No crepitus or effusions noted.  No bony pathology or digital deformities noted.  HAV  B/L.  Skin  normotropic skin with no porokeratosis noted bilaterally.  No signs of infections or ulcers noted.  Asymptomatic callus 1st MPJ  Right foot.   Onychomycosis  Pain in right toes  Pain in left toes  Consent was obtained for treatment procedures.   Mechanical debridement of nails 1-5  bilaterally performed with a nail nipper.  Filed with dremel without incident.    Return office visit   3 months                   Told patient to return for periodic foot care and evaluation due to potential at risk complications.   Gardiner Barefoot DPM

## 2020-09-08 ENCOUNTER — Other Ambulatory Visit: Payer: Medicare Other

## 2020-09-09 ENCOUNTER — Other Ambulatory Visit: Payer: Self-pay

## 2020-09-13 ENCOUNTER — Ambulatory Visit: Payer: Medicare Other | Admitting: Internal Medicine

## 2020-09-13 ENCOUNTER — Other Ambulatory Visit: Payer: Self-pay

## 2020-09-19 ENCOUNTER — Other Ambulatory Visit: Payer: Self-pay | Admitting: Internal Medicine

## 2020-10-03 ENCOUNTER — Ambulatory Visit (INDEPENDENT_AMBULATORY_CARE_PROVIDER_SITE_OTHER): Payer: Medicare Other | Admitting: Internal Medicine

## 2020-10-03 ENCOUNTER — Encounter: Payer: Self-pay | Admitting: Internal Medicine

## 2020-10-03 ENCOUNTER — Other Ambulatory Visit: Payer: Self-pay

## 2020-10-03 DIAGNOSIS — R739 Hyperglycemia, unspecified: Secondary | ICD-10-CM | POA: Diagnosis not present

## 2020-10-03 DIAGNOSIS — D649 Anemia, unspecified: Secondary | ICD-10-CM | POA: Diagnosis not present

## 2020-10-03 DIAGNOSIS — E7800 Pure hypercholesterolemia, unspecified: Secondary | ICD-10-CM

## 2020-10-03 DIAGNOSIS — I2510 Atherosclerotic heart disease of native coronary artery without angina pectoris: Secondary | ICD-10-CM | POA: Diagnosis not present

## 2020-10-03 DIAGNOSIS — I10 Essential (primary) hypertension: Secondary | ICD-10-CM | POA: Diagnosis not present

## 2020-10-03 DIAGNOSIS — F439 Reaction to severe stress, unspecified: Secondary | ICD-10-CM

## 2020-10-03 DIAGNOSIS — E039 Hypothyroidism, unspecified: Secondary | ICD-10-CM

## 2020-10-03 DIAGNOSIS — I6523 Occlusion and stenosis of bilateral carotid arteries: Secondary | ICD-10-CM

## 2020-10-03 LAB — IBC + FERRITIN
Ferritin: 180.2 ng/mL (ref 22.0–322.0)
Iron: 103 ug/dL (ref 42–165)
Saturation Ratios: 37.2 % (ref 20.0–50.0)
Transferrin: 198 mg/dL — ABNORMAL LOW (ref 212.0–360.0)

## 2020-10-03 LAB — LIPID PANEL
Cholesterol: 134 mg/dL (ref 0–200)
HDL: 59.7 mg/dL (ref >39.00)
LDL Cholesterol: 57 mg/dL (ref 0–99)
NonHDL: 74.3
Total CHOL/HDL Ratio: 2
Triglycerides: 88 mg/dL (ref 0.0–149.0)
VLDL: 17.6 mg/dL (ref 0.0–40.0)

## 2020-10-03 LAB — CBC WITH DIFFERENTIAL/PLATELET
Basophils Absolute: 0.1 10*3/uL (ref 0.0–0.1)
Basophils Relative: 1.4 % (ref 0.0–3.0)
Eosinophils Absolute: 0.3 10*3/uL (ref 0.0–0.7)
Eosinophils Relative: 6.2 % — ABNORMAL HIGH (ref 0.0–5.0)
HCT: 38.6 % — ABNORMAL LOW (ref 39.0–52.0)
Hemoglobin: 13 g/dL (ref 13.0–17.0)
Lymphocytes Relative: 23.2 % (ref 12.0–46.0)
Lymphs Abs: 1.1 10*3/uL (ref 0.7–4.0)
MCHC: 33.6 g/dL (ref 30.0–36.0)
MCV: 88.9 fl (ref 78.0–100.0)
Monocytes Absolute: 0.4 10*3/uL (ref 0.1–1.0)
Monocytes Relative: 9.6 % (ref 3.0–12.0)
Neutro Abs: 2.7 10*3/uL (ref 1.4–7.7)
Neutrophils Relative %: 59.6 % (ref 43.0–77.0)
Platelets: 259 10*3/uL (ref 150.0–400.0)
RBC: 4.34 Mil/uL (ref 4.22–5.81)
RDW: 14 % (ref 11.5–15.5)
WBC: 4.5 10*3/uL (ref 4.0–10.5)

## 2020-10-03 LAB — BASIC METABOLIC PANEL
BUN: 7 mg/dL (ref 6–23)
CO2: 28 mEq/L (ref 19–32)
Calcium: 9.3 mg/dL (ref 8.4–10.5)
Chloride: 98 mEq/L (ref 96–112)
Creatinine, Ser: 0.84 mg/dL (ref 0.40–1.50)
GFR: 82.44 mL/min (ref >60.00)
Glucose, Bld: 94 mg/dL (ref 70–99)
Potassium: 4.3 mEq/L (ref 3.5–5.1)
Sodium: 132 mEq/L — ABNORMAL LOW (ref 135–145)

## 2020-10-03 LAB — HEPATIC FUNCTION PANEL
ALT: 7 U/L (ref 0–53)
AST: 15 U/L (ref 0–37)
Albumin: 4.6 g/dL (ref 3.5–5.2)
Alkaline Phosphatase: 96 U/L (ref 39–117)
Bilirubin, Direct: 0.2 mg/dL (ref 0.0–0.3)
Total Bilirubin: 0.7 mg/dL (ref 0.2–1.2)
Total Protein: 7 g/dL (ref 6.0–8.3)

## 2020-10-03 LAB — HEMOGLOBIN A1C: Hgb A1c MFr Bld: 5.8 % (ref 4.6–6.5)

## 2020-10-03 LAB — TSH: TSH: 1.02 u[IU]/mL (ref 0.35–4.50)

## 2020-10-03 MED ORDER — AMLODIPINE BESYLATE 5 MG PO TABS: 5.0000 mg | ORAL_TABLET | Freq: Every day | ORAL | 3 refills | Status: DC

## 2020-10-03 NOTE — Progress Notes (Signed)
Patient ID: Daniel Austin, male   DOB: 06-28-1941, 80 y.o.   MRN: [id number]   Subjective:    Patient ID: Daniel Austin, male    DOB: 02/18/1941, 80 y.o.   MRN: [id number]  HPI This visit occurred during the SARS-CoV-2 public health emergency.  Safety protocols were in place, including screening questions prior to the visit, additional usage of staff PPE, and extensive cleaning of exam room while observing appropriate contact time as indicated for disinfecting solutions.  Patient here for a scheduled follow up.  Here to follow up regarding his blood pressure, blood sugar and cholesterol.  Trying to stay active.  No chest pain or sob reported.  No abdominal pain or bowel change reported.  [place] after Christmas.  No injury.  No LOC.  No residual pain/problems.  Blood pressures reviewed.  Occasional elevation, but most readings averaging 120-130s/70-80.  Handling stress. Has started gradually decreasing lorazepam.    Past Medical History:  Diagnosis Date  . Anemia   . Anxiety   . Arthritis   . CAD (coronary artery disease)   . Cardiomyopathy ([place])   . Carotid artery stenosis    high-grade symptomatic right sided s/p stent twice  . CHF (congestive heart failure) (HCC)    chronic movement fluid and history of CHF he noticed in the   . Depression   . Fractures, multiple    Metal rod in left foot  . GERD (gastroesophageal reflux disease)   . Heart block   . Hypercholesterolemia   . Hyperglycemia   . Hypertension   . Hypothyroidism   . Knee pain   . Presence of permanent cardiac pacemaker   . Shortness of breath dyspnea   . Stroke [facility])    clot in neck broke off left side weakness 2008  resolved  . Syncope    second degree heart block s/p St Jude's pacemaker placement 12/08 with his history is limited with any  . Ulcer of gastric fundus    Past Surgical History:  Procedure Laterality Date  . CARDIAC CATHETERIZATION    . CORONARY ANGIOPLASTY     stents x 3  . heart stint   11/04/2014   [facility]  . HERNIA REPAIR     inguinal  . INSERT / REPLACE / REMOVE PACEMAKER    . JOINT REPLACEMENT     right knee  . KNEE ARTHROPLASTY Right 02/08/2016   Procedure: COMPUTER ASSISTED TOTAL KNEE ARTHROPLASTY;  Surgeon: Dereck Leep, MD;  Location: [facility] ORS;  Service: Orthopedics;  Laterality: Right;  . KNEE ARTHROPLASTY Left 06/27/2016   Procedure: COMPUTER ASSISTED TOTAL KNEE ARTHROPLASTY;  Surgeon: Dereck Leep, MD;  Location: [facility] ORS;  Service: Orthopedics;  Laterality: Left;  . PACEMAKER INSERTION  08/2007  . PACEMAKER INSERTION N/A 11/16/2015   Procedure: INSERTION PACEMAKER/ PACEMAKER CHANGE OUT;  Surgeon: Isaias Cowman, MD;  Location: [facility] ORS;  Service: Cardiovascular;  Laterality: N/A;  . PERCUTANEOUS PLACEMENT INTRAVASCULAR STENT CERVICAL CAROTID ARTERY     twice, right carotid artery   Family History  Problem Relation Age of Onset  . Heart disease Mother        CHF  . Heart disease Father        CHF  . Thyroid disease Father        hypothyroidism  . Colon polyps Father   . Diabetes Sister        diet controlled  . Thyroid disease Sister   . Cancer Sister  breast   . Thyroid disease Sister   . Cancer Sister        breast  . Colon cancer Neg Hx   . Prostate cancer Neg Hx    Social History   Socioeconomic History  . Marital status: Married    Spouse name: Not on file  . Number of children: 1  . Years of education: Not on file  . Highest education level: Not on file  Occupational History  . Not on file  Tobacco Use  . Smoking status: Never Smoker  . Smokeless tobacco: Never Used  Vaping Use  . Vaping Use: Never used  Substance and Sexual Activity  . Alcohol use: No    Alcohol/week: 0.0 standard drinks  . Drug use: No  . Sexual activity: Not Currently  Other Topics Concern  . Not on file  Social History Narrative  . Not on file   Social Determinants of Health   Financial Resource Strain: Not on file  Food  Insecurity: Not on file  Transportation Needs: Not on file  Physical Activity: Not on file  Stress: Not on file  Social Connections: Not on file    Outpatient Encounter Medications as of 10/03/2020  Medication Sig  . acetaminophen (TYLENOL) 500 MG tablet Take 1,000 mg by mouth every 6 (six) hours as needed.  Marland Kitchen amLODipine (NORVASC) 5 MG tablet Take 1 tablet (5 mg total) by mouth daily.  . Aspirin 81 MG EC tablet Take 81 mg by mouth daily.  . citalopram (CELEXA) 20 MG tablet TAKE 1 TABLET BY MOUTH EVERY DAY  . clopidogrel (PLAVIX) 75 MG tablet TAKE 1 TABLET BY MOUTH EVERY DAY  . fexofenadine (ALLEGRA) 180 MG tablet Take 180 mg by mouth daily. As needed  . levothyroxine (SYNTHROID) 112 MCG tablet TAKE 1 TABLET (112 MCG TOTAL) BY MOUTH DAILY BEFORE BREAKFAST.  Marland Kitchen levothyroxine (SYNTHROID) 125 MCG tablet TAKE 1 TABLET (125 MCG TOTAL) BY MOUTH DAILY BEFORE BREAKFAST.  Marland Kitchen lisinopril (ZESTRIL) 40 MG tablet TAKE 1 TABLET BY MOUTH EVERY DAY  . LORazepam (ATIVAN) 1 MG tablet TAKE 1 TABLET BY MOUTH EVERY DAY AT BEDTIME AS NEEDED  . metoprolol tartrate (LOPRESSOR) 25 MG tablet TAKE 1 TABLET BY MOUTH TWICE A DAY  . naproxen sodium (ANAPROX) 220 MG tablet Take by mouth.  . nitroGLYCERIN (NITROSTAT) 0.4 MG SL tablet Place 0.4 mg under the tongue every 5 (five) minutes as needed. If a third tablet is needed please call 911.  . pantoprazole (PROTONIX) 40 MG tablet TAKE 1 TABLET BY MOUTH EVERY DAY  . Probiotic Product (ALIGN) 4 MG CAPS One tablet per day  . rosuvastatin (CRESTOR) 20 MG tablet TAKE 1 TABLET BY MOUTH EVERY DAY AT NIGHT  . triamcinolone (NASACORT) 55 MCG/ACT AERO nasal inhaler Place into the nose.  . triamcinolone cream (KENALOG) 0.1 % Apply 1 application topically 2 (two) times daily.  . [DISCONTINUED] amLODipine (NORVASC) 2.5 MG tablet TAKE 1 TABLET BY MOUTH EVERY DAY   No facility-administered encounter medications on file as of 10/03/2020.    Review of Systems  Constitutional: Negative  for appetite change and unexpected weight change.  HENT: Negative for congestion and sinus pressure.   Respiratory: Negative for cough, chest tightness and shortness of breath.   Cardiovascular: Negative for chest pain, palpitations and leg swelling.  Gastrointestinal: Negative for abdominal pain, diarrhea, nausea and vomiting.  Genitourinary: Negative for difficulty urinating and dysuria.  Musculoskeletal: Negative for joint swelling and myalgias.  Neurological: Negative for dizziness,  light-headedness and headaches.  Psychiatric/Behavioral: Negative for agitation and dysphoric mood.       Objective:    Physical Exam Vitals reviewed.  Constitutional:      General: He is not in acute distress.    Appearance: Normal appearance. He is well-developed and well-nourished.  HENT:     Head: Normocephalic and atraumatic.     Right Ear: External ear normal.     Left Ear: External ear normal.  Eyes:     General: No scleral icterus.       Right eye: No discharge.        Left eye: No discharge.     Conjunctiva/sclera: Conjunctivae normal.  Cardiovascular:     Rate and Rhythm: Normal rate and regular rhythm.  Pulmonary:     Effort: Pulmonary effort is normal. No respiratory distress.     Breath sounds: Normal breath sounds.  Abdominal:     General: Bowel sounds are normal.     Palpations: Abdomen is soft.     Tenderness: There is no abdominal tenderness.  Musculoskeletal:        General: No swelling, tenderness or edema.     Cervical back: Neck supple. No tenderness.  Lymphadenopathy:     Cervical: No cervical adenopathy.  Skin:    Findings: No erythema or rash.  Neurological:     Mental Status: He is alert.  Psychiatric:        Mood and Affect: Mood and affect and mood normal.        Behavior: Behavior normal.     BP 122/78 (BP Location: Left Arm, Patient Position: Sitting)   Pulse 91   Temp 97.7 F (36.5 C)   Ht 5' 10.98" (1.803 m)   Wt 197 lb 9.6 oz (89.6 kg)   BMI  27.57 kg/m  Wt Readings from Last 3 Encounters:  10/03/20 197 lb 9.6 oz (89.6 kg)  06/14/20 198 lb 3.2 oz (89.9 kg)  05/24/20 196 lb (88.9 kg)     Lab Results  Component Value Date   WBC 4.5 10/03/2020   HGB 13.0 10/03/2020   HCT 38.6 (L) 10/03/2020   PLT 259.0 10/03/2020   GLUCOSE 94 10/03/2020   CHOL 134 10/03/2020   TRIG 88.0 10/03/2020   HDL 59.70 10/03/2020   LDLCALC 57 10/03/2020   ALT 7 10/03/2020   AST 15 10/03/2020   NA 132 (L) 10/03/2020   K 4.3 10/03/2020   CL 98 10/03/2020   CREATININE 0.84 10/03/2020   BUN 7 10/03/2020   CO2 28 10/03/2020   TSH 1.02 10/03/2020   PSA 0.41 04/18/2018   INR 1.13 06/11/2016   HGBA1C 5.8 10/03/2020   MICROALBUR 1.6 05/25/2014       Assessment & Plan:   Problem List Items Addressed This Visit    Anemia    Has had previous GI w/up.  Follow [facility].        Carotid artery stenosis    Evaluated by [facility] 05/24/20.  Stable.  Recommended f/u in one year.        Relevant Medications   amLODipine (NORVASC) 5 MG tablet   Coronary atherosclerosis of native coronary artery    Continue crestor, metoprolol and crestor.  Continue risk factor modification.        Relevant Medications   amLODipine (NORVASC) 5 MG tablet   Essential hypertension, benign    Blood pressure as outlined.  Continue lisinopril.  Also taking 44m of amlodipine now.  Refilled.  Continue current  medication regimen.  Follow pressures.  Follow metabolic panel.       Relevant Medications   amLODipine (NORVASC) 5 MG tablet   Hyperglycemia    Low carb diet and exercise.  Follow met b and a1c.        Hypothyroidism    On thyroid replacement.  Follow tsh.       Pure hypercholesterolemia    On crestor.  Low cholesterol diet and exercise.  Follow lipid panel and liver function tests.        Relevant Medications   amLODipine (NORVASC) 5 MG tablet   Stress    Tolerating slow taper of lorazepam.  Handling stress.  Follow.            Einar Pheasant, MD

## 2020-10-04 ENCOUNTER — Other Ambulatory Visit: Payer: Self-pay | Admitting: Internal Medicine

## 2020-10-04 ENCOUNTER — Telehealth: Payer: Self-pay

## 2020-10-04 DIAGNOSIS — E871 Hypo-osmolality and hyponatremia: Secondary | ICD-10-CM

## 2020-10-04 NOTE — Progress Notes (Signed)
Order placed for f/u sodium.  ?

## 2020-10-04 NOTE — Telephone Encounter (Signed)
LMTCB in regards to lab results.  

## 2020-10-09 ENCOUNTER — Encounter: Payer: Self-pay | Admitting: Internal Medicine

## 2020-10-09 NOTE — Assessment & Plan Note (Signed)
On thyroid replacement.  Follow tsh.  

## 2020-10-09 NOTE — Assessment & Plan Note (Signed)
Blood pressure as outlined.  Continue lisinopril.  Also taking 5mg  of amlodipine now.  Refilled.  Continue current medication regimen.  Follow pressures.  Follow metabolic panel.

## 2020-10-09 NOTE — Assessment & Plan Note (Signed)
Tolerating slow taper of lorazepam.  Handling stress.  Follow.

## 2020-10-09 NOTE — Assessment & Plan Note (Signed)
On crestor.  Low cholesterol diet and exercise.  Follow lipid panel and liver function tests.   

## 2020-10-09 NOTE — Assessment & Plan Note (Signed)
Low carb diet and exercise.  Follow met b and a1c.   

## 2020-10-09 NOTE — Assessment & Plan Note (Signed)
Has had previous GI w/up.  Follow [facility].  

## 2020-10-09 NOTE — Assessment & Plan Note (Signed)
Evaluated by [facility] 05/24/20.  Stable.  Recommended f/u in one year.

## 2020-10-09 NOTE — Assessment & Plan Note (Signed)
Continue crestor, metoprolol and crestor.  Continue risk factor modification.

## 2020-10-18 ENCOUNTER — Other Ambulatory Visit: Payer: Self-pay | Admitting: Internal Medicine

## 2020-11-07 ENCOUNTER — Ambulatory Visit: Payer: Medicare Other | Admitting: Podiatry

## 2020-11-07 ENCOUNTER — Encounter: Payer: Self-pay | Admitting: Podiatry

## 2020-11-07 ENCOUNTER — Other Ambulatory Visit: Payer: Self-pay

## 2020-11-07 DIAGNOSIS — B351 Tinea unguium: Secondary | ICD-10-CM

## 2020-11-07 DIAGNOSIS — D689 Coagulation defect, unspecified: Secondary | ICD-10-CM

## 2020-11-07 DIAGNOSIS — M79671 Pain in right foot: Secondary | ICD-10-CM | POA: Diagnosis not present

## 2020-11-07 DIAGNOSIS — M21619 Bunion of unspecified foot: Secondary | ICD-10-CM

## 2020-11-07 DIAGNOSIS — M79672 Pain in left foot: Secondary | ICD-10-CM

## 2020-11-07 NOTE — Progress Notes (Signed)
This patient returns to my office for at risk foot care.  This patient requires this care by a professional since this patient will be at risk due to having coagulation defect due to plavix.   This patient is unable to cut nails himself since the patient cannot reach his nails.These nails are painful walking and wearing shoes.  This patient presents for at risk foot care today.  General Appearance  Alert, conversant and in no acute stress.  Vascular  Dorsalis pedis and posterior tibial  pulses are palpable  bilaterally.  Capillary return is within normal limits  bilaterally. Temperature is within normal limits  bilaterally.  Neurologic  Senn-Weinstein monofilament wire test within normal limits  bilaterally. Muscle power within normal limits bilaterally.  Nails Thick disfigured discolored nails with subungual debris  from hallux to fifth toes bilaterally. No evidence of bacterial infection or drainage bilaterally.  Orthopedic  No limitations of motion  feet .  No crepitus or effusions noted.  No bony pathology or digital deformities noted.  HAV  B/L.  Skin  normotropic skin with no porokeratosis noted bilaterally.  No signs of infections or ulcers noted.  Asymptomatic callus 1st MPJ  Right foot.   Onychomycosis  Pain in right toes  Pain in left toes  Consent was obtained for treatment procedures.   Mechanical debridement of nails 1-5  bilaterally performed with a nail nipper.  Filed with dremel without incident.    Return office visit   3 months                   Told patient to return for periodic foot care and evaluation due to potential at risk complications.   Gardiner Barefoot DPM

## 2020-11-14 ENCOUNTER — Other Ambulatory Visit: Payer: Self-pay | Admitting: Internal Medicine

## 2020-11-30 ENCOUNTER — Other Ambulatory Visit (INDEPENDENT_AMBULATORY_CARE_PROVIDER_SITE_OTHER): Payer: Medicare Other

## 2020-11-30 ENCOUNTER — Other Ambulatory Visit: Payer: Self-pay

## 2020-11-30 DIAGNOSIS — E871 Hypo-osmolality and hyponatremia: Secondary | ICD-10-CM | POA: Diagnosis not present

## 2020-11-30 LAB — SODIUM: Sodium: 135 mEq/L (ref 135–145)

## 2020-12-01 ENCOUNTER — Telehealth: Payer: Self-pay | Admitting: Internal Medicine

## 2020-12-01 NOTE — Telephone Encounter (Signed)
Patient was returning call about his lab results stated that he has a new phone do not know how to get his message yet.

## 2020-12-02 NOTE — Telephone Encounter (Signed)
PT AWARE LABS NORMAL

## 2020-12-05 DIAGNOSIS — I2510 Atherosclerotic heart disease of native coronary artery without angina pectoris: Secondary | ICD-10-CM | POA: Diagnosis not present

## 2020-12-05 DIAGNOSIS — Z9889 Other specified postprocedural states: Secondary | ICD-10-CM | POA: Diagnosis not present

## 2020-12-05 DIAGNOSIS — E785 Hyperlipidemia, unspecified: Secondary | ICD-10-CM | POA: Diagnosis not present

## 2020-12-05 DIAGNOSIS — I10 Essential (primary) hypertension: Secondary | ICD-10-CM | POA: Diagnosis not present

## 2020-12-05 DIAGNOSIS — I472 Ventricular tachycardia: Secondary | ICD-10-CM | POA: Diagnosis not present

## 2020-12-05 DIAGNOSIS — I5022 Chronic systolic (congestive) heart failure: Secondary | ICD-10-CM | POA: Diagnosis not present

## 2020-12-05 DIAGNOSIS — I6529 Occlusion and stenosis of unspecified carotid artery: Secondary | ICD-10-CM | POA: Diagnosis not present

## 2020-12-05 DIAGNOSIS — I459 Conduction disorder, unspecified: Secondary | ICD-10-CM | POA: Diagnosis not present

## 2020-12-17 ENCOUNTER — Other Ambulatory Visit: Payer: Self-pay | Admitting: Internal Medicine

## 2020-12-30 DIAGNOSIS — H2511 Age-related nuclear cataract, right eye: Secondary | ICD-10-CM | POA: Diagnosis not present

## 2020-12-30 DIAGNOSIS — H35371 Puckering of macula, right eye: Secondary | ICD-10-CM | POA: Diagnosis not present

## 2021-01-03 DIAGNOSIS — I5022 Chronic systolic (congestive) heart failure: Secondary | ICD-10-CM | POA: Diagnosis not present

## 2021-01-14 ENCOUNTER — Other Ambulatory Visit: Payer: Self-pay | Admitting: Internal Medicine

## 2021-01-16 NOTE — Telephone Encounter (Signed)
RX Refill:ativan Last Seen:10-03-20 Last ordered:11-15-20

## 2021-01-18 DIAGNOSIS — H2511 Age-related nuclear cataract, right eye: Secondary | ICD-10-CM | POA: Diagnosis not present

## 2021-01-31 ENCOUNTER — Encounter: Payer: Medicare Other | Admitting: Internal Medicine

## 2021-02-06 ENCOUNTER — Ambulatory Visit: Payer: Medicare Other | Admitting: Podiatry

## 2021-02-07 ENCOUNTER — Ambulatory Visit: Payer: Medicare Other

## 2021-02-27 ENCOUNTER — Encounter: Payer: Self-pay | Admitting: Ophthalmology

## 2021-02-28 NOTE — Anesthesia Preprocedure Evaluation (Addendum)
Anesthesia Evaluation  Patient identified by MRN, date of birth, ID band Patient awake    Reviewed: Allergy & Precautions, H&P , NPO status , Patient's Chart, lab work & pertinent test results, reviewed documented beta blocker date and time   Airway Mallampati: II  TM Distance: >3 FB Neck ROM: full    Dental no notable dental hx.    Pulmonary neg pulmonary ROS,    Pulmonary exam normal breath sounds clear to auscultation       Cardiovascular Exercise Tolerance: Good hypertension, + CAD (s/p CABG, stents on Plavix), + Peripheral Vascular Disease (carotid stenosis) and +CHF  (-) dysrhythmias + pacemaker  Rhythm:regular Rate:Normal     Neuro/Psych PSYCHIATRIC DISORDERS Anxiety Depression CVA    GI/Hepatic Neg liver ROS, PUD, GERD  ,  Endo/Other  Hypothyroidism   Renal/GU negative Renal ROS  negative genitourinary   Musculoskeletal   Abdominal   Peds  Hematology  (+) Blood dyscrasia, anemia ,   Anesthesia Other Findings Cardiology note 12/05/20:  Assessment   80 y.o. male with  1. 3-vessel coronary artery disease  2. Benign essential hypertension  3. Chronic systolic CHF (congestive heart failure) (CMS-HCC)  4. Atherosclerosis of native coronary artery of native heart without angina pectoris  5. H/O cardiac catheterization  6. Primary hypertension  7. Nonsustained ventricular tachycardia (CMS-HCC)  8. S/P cardiac catheterization  9. Hyperlipidemia, unspecified hyperlipidemia type  10. Occlusion and stenosis of unspecified carotid artery  11. Stokes-Adams syncope   80 year old gentleman with known coronary artery disease, status post DES mid LAD, currently without chest pain. Patient has essential hypertension, blood pressure well controlled on current BP medications with excellent home blood pressure recordings. Patient has hyperlipidemia with excellent control of LDL cholesterol on Crestor. 2D echocardiogram  11/23/2016 revealed mildly reduced left ventricular function with LVEF 45% with mild mitral and tricuspid regurgitation, similar to prior study and 2016.  Plan   1. Continue current medications 2. Continue dual antiplatelet therapy 3. Counseled patient about low-sodium diet 4. DASH diet printed instructions given to the patient 5. Counseled patient about low-cholesterol diet 6. Continue Crestor for hyperlipidemia management  7. Low-fat and cholesterol diet printed instructions given to the patient 8. Pacemaker clinic to be scheduled 9. Encourage patient to lose weight 10. Return to clinic for follow-up in 6 months  No orders of the defined types were placed in this encounter.  Return in about 6 months (around 06/07/2021).   Reproductive/Obstetrics negative OB ROS                            Anesthesia Physical Anesthesia Plan  ASA: IV  Anesthesia Plan: MAC   Post-op Pain Management:    Induction: Intravenous  PONV Risk Score and Plan: 1 and Midazolam, TIVA and Treatment may vary due to age or medical condition  Airway Management Planned:   Additional Equipment:   Intra-op Plan:   Post-operative Plan:   Informed Consent: I have reviewed the patients History and Physical, chart, labs and discussed the procedure including the risks, benefits and alternatives for the proposed anesthesia with the patient or authorized representative who has indicated his/her understanding and acceptance.     Dental Advisory Given  Plan Discussed with: CRNA  Anesthesia Plan Comments:        Anesthesia Quick Evaluation

## 2021-03-01 ENCOUNTER — Encounter: Payer: Medicare Other | Admitting: Internal Medicine

## 2021-03-07 ENCOUNTER — Encounter
Admission: RE | Disposition: A | Discharge status: Home / Self Care | Payer: Self-pay | Source: Home / Self Care | Attending: Ophthalmology

## 2021-03-07 ENCOUNTER — Encounter: Payer: Self-pay | Admitting: Ophthalmology

## 2021-03-07 ENCOUNTER — Ambulatory Visit: Payer: Medicare Other | Admitting: Anesthesiology

## 2021-03-07 ENCOUNTER — Other Ambulatory Visit: Payer: Self-pay

## 2021-03-07 ENCOUNTER — Ambulatory Visit
Admission: RE | Admit: 2021-03-07 | Discharge: 2021-03-07 | Disposition: A | Discharge status: Home / Self Care | Payer: Medicare Other | Attending: Ophthalmology | Admitting: Ophthalmology

## 2021-03-07 DIAGNOSIS — Z7902 Long term (current) use of antithrombotics/antiplatelets: Secondary | ICD-10-CM | POA: Insufficient documentation

## 2021-03-07 DIAGNOSIS — Z96653 Presence of artificial knee joint, bilateral: Secondary | ICD-10-CM | POA: Insufficient documentation

## 2021-03-07 DIAGNOSIS — H2511 Age-related nuclear cataract, right eye: Secondary | ICD-10-CM | POA: Diagnosis not present

## 2021-03-07 DIAGNOSIS — Z79899 Other long term (current) drug therapy: Secondary | ICD-10-CM | POA: Insufficient documentation

## 2021-03-07 DIAGNOSIS — Z8673 Personal history of transient ischemic attack (TIA), and cerebral infarction without residual deficits: Secondary | ICD-10-CM | POA: Insufficient documentation

## 2021-03-07 DIAGNOSIS — E039 Hypothyroidism, unspecified: Secondary | ICD-10-CM | POA: Insufficient documentation

## 2021-03-07 DIAGNOSIS — Z888 Allergy status to other drugs, medicaments and biological substances status: Secondary | ICD-10-CM | POA: Diagnosis not present

## 2021-03-07 DIAGNOSIS — Z8371 Family history of colonic polyps: Secondary | ICD-10-CM | POA: Diagnosis not present

## 2021-03-07 DIAGNOSIS — Z7982 Long term (current) use of aspirin: Secondary | ICD-10-CM | POA: Diagnosis not present

## 2021-03-07 DIAGNOSIS — Z955 Presence of coronary angioplasty implant and graft: Secondary | ICD-10-CM | POA: Insufficient documentation

## 2021-03-07 DIAGNOSIS — Z79890 Hormone replacement therapy (postmenopausal): Secondary | ICD-10-CM | POA: Insufficient documentation

## 2021-03-07 DIAGNOSIS — Z8249 Family history of ischemic heart disease and other diseases of the circulatory system: Secondary | ICD-10-CM | POA: Insufficient documentation

## 2021-03-07 DIAGNOSIS — Z8349 Family history of other endocrine, nutritional and metabolic diseases: Secondary | ICD-10-CM | POA: Diagnosis not present

## 2021-03-07 DIAGNOSIS — Z950 Presence of cardiac pacemaker: Secondary | ICD-10-CM | POA: Diagnosis not present

## 2021-03-07 DIAGNOSIS — H25811 Combined forms of age-related cataract, right eye: Secondary | ICD-10-CM | POA: Diagnosis not present

## 2021-03-07 HISTORY — PX: CATARACT EXTRACTION W/PHACO: SHX586

## 2021-03-07 SURGERY — PHACOEMULSIFICATION, CATARACT, WITH IOL INSERTION
Anesthesia: Monitor Anesthesia Care | Site: Eye | Laterality: Right

## 2021-03-07 SURGERY — PHACOEMULSIFICATION, CATARACT, WITH IOL INSERTION
Anesthesia: Topical | Laterality: Right

## 2021-03-07 MED ORDER — MIDAZOLAM HCL 2 MG/2ML IJ SOLN
INTRAMUSCULAR | Status: DC | PRN
  Administered 2021-03-07: 1 mg via INTRAVENOUS

## 2021-03-07 MED ORDER — LACTATED RINGERS IV SOLN: INTRAVENOUS | Status: DC

## 2021-03-07 MED ORDER — NA CHONDROIT SULF-NA HYALURON 40-17 MG/ML IO SOLN
INTRAOCULAR | Status: DC | PRN
  Administered 2021-03-07: 1 mL via INTRAOCULAR

## 2021-03-07 MED ORDER — BRIMONIDINE TARTRATE-TIMOLOL 0.2-0.5 % OP SOLN
OPHTHALMIC | Status: DC | PRN
  Administered 2021-03-07: 1 [drp] via OPHTHALMIC

## 2021-03-07 MED ORDER — EPINEPHRINE PF 1 MG/ML IJ SOLN
INTRAOCULAR | Status: DC | PRN
  Administered 2021-03-07: 71 mL via OPHTHALMIC

## 2021-03-07 MED ORDER — MOXIFLOXACIN HCL 0.5 % OP SOLN
OPHTHALMIC | Status: DC | PRN
  Administered 2021-03-07: 0.2 mL via OPHTHALMIC

## 2021-03-07 MED ORDER — LIDOCAINE HCL (PF) 2 % IJ SOLN
INTRAOCULAR | Status: DC | PRN
  Administered 2021-03-07: 2 mL

## 2021-03-07 MED ORDER — ARMC OPHTHALMIC DILATING DROPS
1.0000 "application " | OPHTHALMIC | Status: DC | PRN
  Administered 2021-03-07 (×3): 1 via OPHTHALMIC

## 2021-03-07 MED ORDER — TETRACAINE HCL 0.5 % OP SOLN
1.0000 [drp] | OPHTHALMIC | Status: DC | PRN
  Administered 2021-03-07 (×3): 1 [drp] via OPHTHALMIC

## 2021-03-07 MED ORDER — FENTANYL CITRATE (PF) 100 MCG/2ML IJ SOLN
INTRAMUSCULAR | Status: DC | PRN
  Administered 2021-03-07: 50 ug via INTRAVENOUS

## 2021-03-07 SURGICAL SUPPLY — 17 items
CANNULA ANT/CHMB 27GA (MISCELLANEOUS) ×6 IMPLANT
GLOVE SURG TRIUMPH 8.0 PF LTX (GLOVE) ×6 IMPLANT
GOWN STRL REUS W/ TWL LRG LVL3 (GOWN DISPOSABLE) ×2 IMPLANT
GOWN STRL REUS W/TWL LRG LVL3 (GOWN DISPOSABLE) ×6
LENS IOL TECNIS EYHANCE 24.0 (Intraocular Lens) ×3 IMPLANT
MARKER SKIN DUAL TIP RULER LAB (MISCELLANEOUS) ×3 IMPLANT
NEEDLE FILTER BLUNT 18X 1/2SAF (NEEDLE) ×2
NEEDLE FILTER BLUNT 18X1 1/2 (NEEDLE) ×1 IMPLANT
PACK EYE AFTER SURG (MISCELLANEOUS) ×3 IMPLANT
PACK OPTHALMIC (MISCELLANEOUS) ×3 IMPLANT
PACK PORFILIO (MISCELLANEOUS) ×3 IMPLANT
SUT ETHILON 10-0 CS-B-6CS-B-6 (SUTURE)
SUTURE EHLN 10-0 CS-B-6CS-B-6 (SUTURE) IMPLANT
SYR 3ML LL SCALE MARK (SYRINGE) ×3 IMPLANT
SYR TB 1ML LUER SLIP (SYRINGE) ×3 IMPLANT
WATER STERILE IRR 250ML POUR (IV SOLUTION) ×3 IMPLANT
WIPE NON LINTING 3.25X3.25 (MISCELLANEOUS) ×3 IMPLANT

## 2021-03-07 NOTE — Transfer of Care (Signed)
Immediate Anesthesia Transfer of Care Note  Patient: Daniel Austin  Procedure(s) Performed: CATARACT EXTRACTION PHACO AND INTRAOCULAR LENS PLACEMENT (IOC) RIGHT 8.69 00:55.2 (Right: Eye)  Patient Location: PACU  Anesthesia Type: MAC  Level of Consciousness: awake, alert  and patient cooperative  Airway and Oxygen Therapy: Patient Spontanous Breathing and Patient connected to supplemental oxygen  Post-op Assessment: Post-op Vital signs reviewed, Patient's Cardiovascular Status Stable, Respiratory Function Stable, Patent Airway and No signs of Nausea or vomiting  Post-op Vital Signs: Reviewed and stable  Complications: No notable events documented.

## 2021-03-07 NOTE — Op Note (Signed)
PREOPERATIVE DIAGNOSIS:  Nuclear sclerotic cataract of the right eye.   POSTOPERATIVE DIAGNOSIS:  Cataract   OPERATIVE PROCEDURE:ORPROCALL@   SURGEON:  Birder Robson, MD.   ANESTHESIA:  Anesthesiologist: Alisa Graff, MD CRNA: Cameron Ali, CRNA  1.      Managed anesthesia care. 2.      0.61ml of Shugarcaine was instilled in the eye following the paracentesis.   COMPLICATIONS:  None.   TECHNIQUE:   Stop and chop   DESCRIPTION OF PROCEDURE:  The patient was examined and consented in the preoperative holding area where the aforementioned topical anesthesia was applied to the right eye and then brought back to the Operating Room where the right eye was prepped and draped in the usual sterile ophthalmic fashion and a lid speculum was placed. A paracentesis was created with the side port blade and the anterior chamber was filled with viscoelastic. A near clear corneal incision was performed with the steel keratome. A continuous curvilinear capsulorrhexis was performed with a cystotome followed by the capsulorrhexis forceps. Hydrodissection and hydrodelineation were carried out with BSS on a blunt cannula. The lens was removed in a stop and chop  technique and the remaining cortical material was removed with the irrigation-aspiration handpiece. The capsular bag was inflated with viscoelastic and the Technis [id number]  lens was placed in the capsular bag without complication. The remaining viscoelastic was removed from the eye with the irrigation-aspiration handpiece. The wounds were hydrated. The anterior chamber was flushed with BSS and the eye was inflated to physiologic pressure. 0.50ml of Vigamox was placed in the anterior chamber. The wounds were found to be water tight. The eye was dressed with Combigan. The patient was given protective glasses to wear throughout the day and a shield with which to sleep tonight. The patient was also given drops with which to begin a drop regimen today and will  follow-up with me in one day. Implant Name Type Inv. Item Serial No. Manufacturer Lot No. LRB No. Used Action  LENS IOL TECNIS EYHANCE 24.0 - [id number] Intraocular Lens LENS IOL TECNIS EYHANCE 24.0 [id number] JOHNSON   Right 1 Implanted   Procedure(s): CATARACT EXTRACTION PHACO AND INTRAOCULAR LENS PLACEMENT (IOC) RIGHT 8.69 00:55.2 (Right)  Electronically signed: Birder Robson 03/07/2021 9:09 AM

## 2021-03-07 NOTE — Anesthesia Procedure Notes (Signed)
Procedure Name: MAC Date/Time: 03/07/2021 9:02 AM Performed by: Cameron Ali, CRNA Pre-anesthesia Checklist: Patient identified, Emergency Drugs available, Suction available, Timeout performed and Patient being monitored Patient Re-evaluated:Patient Re-evaluated prior to induction Oxygen Delivery Method: Nasal cannula Placement Confirmation: positive ETCO2

## 2021-03-07 NOTE — Anesthesia Postprocedure Evaluation (Signed)
Anesthesia Post Note  Patient: Daniel Austin  Procedure(s) Performed: CATARACT EXTRACTION PHACO AND INTRAOCULAR LENS PLACEMENT (IOC) RIGHT 8.69 00:55.2 (Right: Eye)     Patient location during evaluation: PACU Anesthesia Type: MAC Level of consciousness: awake and alert Pain management: pain level controlled Vital Signs Assessment: post-procedure vital signs reviewed and stable Respiratory status: spontaneous breathing, nonlabored ventilation, respiratory function stable and patient connected to nasal cannula oxygen Cardiovascular status: stable and blood pressure returned to baseline Postop Assessment: no apparent nausea or vomiting Anesthetic complications: no   No notable events documented.  Alisa Graff

## 2021-03-07 NOTE — H&P (Signed)
[place]   Primary Care Physician:  Einar Pheasant, MD Ophthalmologist: Dr.Nyari Olsson  Pre-Procedure History & Physical: HPI:  Daniel Austin is a 80 y.o. male here for cataract surgery.   Past Medical History:  Diagnosis Date   Anemia    Anxiety    Arthritis    CAD (coronary artery disease)    Cardiomyopathy ([place])    Carotid artery stenosis    high-grade symptomatic right sided s/p stent twice   CHF (congestive heart failure) (HCC)    chronic movement fluid and history of CHF he noticed in the    Depression    Fractures, multiple    Metal rod in left foot   GERD (gastroesophageal reflux disease)    Heart block    Hypercholesterolemia    Hyperglycemia    Hypertension    Hypothyroidism    Knee pain    Presence of permanent cardiac pacemaker    11/16/2015 Medtronic ADR01,   (2008 St Jude)   Shortness of breath dyspnea    Stroke [facility])    clot in neck broke off left side weakness 2008  resolved   Syncope    second degree heart block s/p St Jude's pacemaker placement 12/08 with his history is limited with any   Ulcer of gastric fundus     Past Surgical History:  Procedure Laterality Date   CARDIAC CATHETERIZATION     CORONARY ANGIOPLASTY     stents x 3   heart stint  11/04/2014   [facility]   HERNIA REPAIR     inguinal   INSERT / REPLACE / REMOVE PACEMAKER     JOINT REPLACEMENT     right knee   KNEE ARTHROPLASTY Right 02/08/2016   Procedure: COMPUTER ASSISTED TOTAL KNEE ARTHROPLASTY;  Surgeon: Dereck Leep, MD;  Location: [facility] ORS;  Service: Orthopedics;  Laterality: Right;   KNEE ARTHROPLASTY Left 06/27/2016   Procedure: COMPUTER ASSISTED TOTAL KNEE ARTHROPLASTY;  Surgeon: Dereck Leep, MD;  Location: [facility] ORS;  Service: Orthopedics;  Laterality: Left;   PACEMAKER INSERTION  08/2007   PACEMAKER INSERTION N/A 11/16/2015   Procedure: INSERTION PACEMAKER/ PACEMAKER CHANGE OUT;  Surgeon: Isaias Cowman, MD;  Location: [facility] ORS;  Service:  Cardiovascular;  Laterality: N/A;   PERCUTANEOUS PLACEMENT INTRAVASCULAR STENT CERVICAL CAROTID ARTERY     twice, right carotid artery    Prior to Admission medications   Medication Sig Start Date End Date Taking? Authorizing Provider  acetaminophen (TYLENOL) 500 MG tablet Take 1,000 mg by mouth every 6 (six) hours as needed.   Yes [provider]  amLODipine (NORVASC) 2.5 MG tablet TAKE 1 TABLET BY MOUTH EVERY DAY 12/19/20  Yes Einar Pheasant, MD  amLODipine (NORVASC) 5 MG tablet Take 1 tablet (5 mg total) by mouth daily. 10/03/20  Yes Einar Pheasant, MD  Aspirin 81 MG EC tablet Take 81 mg by mouth daily.   Yes [provider]  citalopram (CELEXA) 20 MG tablet TAKE 1 TABLET BY MOUTH EVERY DAY 12/19/20  Yes Einar Pheasant, MD  clopidogrel (PLAVIX) 75 MG tablet TAKE 1 TABLET BY MOUTH EVERY DAY 01/16/21  Yes Einar Pheasant, MD  fexofenadine (ALLEGRA) 180 MG tablet Take 180 mg by mouth daily. As needed   Yes [provider]  levothyroxine (SYNTHROID) 112 MCG tablet TAKE 1 TABLET (112 MCG TOTAL) BY MOUTH DAILY BEFORE BREAKFAST. 08/01/20  Yes Einar Pheasant, MD  levothyroxine (SYNTHROID) 125 MCG tablet TAKE 1 TABLET (125 MCG TOTAL) BY MOUTH DAILY BEFORE BREAKFAST.  01/16/21  Yes Einar Pheasant, MD  lisinopril (ZESTRIL) 40 MG tablet TAKE 1 TABLET BY MOUTH EVERY DAY 01/16/21  Yes Einar Pheasant, MD  LORazepam (ATIVAN) 1 MG tablet TAKE 1 TABLET BY MOUTH EVERY DAY AT BEDTIME AS NEEDED 01/17/21  Yes Einar Pheasant, MD  metoprolol tartrate (LOPRESSOR) 25 MG tablet TAKE 1 TABLET BY MOUTH TWICE A DAY 01/16/21  Yes Einar Pheasant, MD  naproxen sodium (ANAPROX) 220 MG tablet Take by mouth.   Yes [provider]  pantoprazole (PROTONIX) 40 MG tablet TAKE 1 TABLET BY MOUTH EVERY DAY 12/19/20  Yes Einar Pheasant, MD  Probiotic Product (ALIGN) 4 MG CAPS One tablet per day 01/20/19  Yes Einar Pheasant, MD  rosuvastatin (CRESTOR) 20 MG tablet TAKE 1 TABLET BY MOUTH EVERY DAY AT  NIGHT 11/15/20  Yes Einar Pheasant, MD  triamcinolone (NASACORT) 55 MCG/ACT AERO nasal inhaler Place into the nose. 08/28/18  Yes [provider]  triamcinolone cream (KENALOG) 0.1 % Apply 1 application topically 2 (two) times daily. 09/25/17  Yes Einar Pheasant, MD  nitroGLYCERIN (NITROSTAT) 0.4 MG SL tablet Place 0.4 mg under the tongue every 5 (five) minutes as needed. If a third tablet is needed please call 911.    [provider]    Allergies as of 01/05/2021 - Review Complete 11/07/2020  Allergen Reaction Noted   Beta adrenergic blockers Other (See Comments) 10/31/2012   Lipitor [atorvastatin] Other (See Comments) 10/31/2012    Family History  Problem Relation Age of Onset   Heart disease Mother        CHF   Heart disease Father        CHF   Thyroid disease Father        hypothyroidism   Colon polyps Father    Diabetes Sister        diet controlled   Thyroid disease Sister    Cancer Sister        breast    Thyroid disease Sister    Cancer Sister        breast   Colon cancer Neg Hx    Prostate cancer Neg Hx     Social History   Socioeconomic History   Marital status: Married    Spouse name: Not on file   Number of children: 1   Years of education: Not on file   Highest education level: Not on file  Occupational History   Not on file  Tobacco Use   Smoking status: Never   Smokeless tobacco: Never  Vaping Use   Vaping Use: Never used  Substance and Sexual Activity   Alcohol use: No    Alcohol/week: 0.0 standard drinks   Drug use: No   Sexual activity: Not Currently  Other Topics Concern   Not on file  Social History Narrative   Not on file   Social Determinants of Health   Financial Resource Strain: Not on file  Food Insecurity: Not on file  Transportation Needs: Not on file  Physical Activity: Not on file  Stress: Not on file  Social Connections: Not on file  Intimate Partner Violence: Not on file    Review of Systems: See HPI,  otherwise negative ROS  Physical Exam: BP 122/78   Pulse 86   Temp (!) 97.1 F (36.2 C) (Temporal)   Resp 20   Ht 5\' 11"  (1.803 m)   Wt 89.4 kg   SpO2 97%   BMI 27.49 kg/m  General:   Alert,  pleasant and  cooperative in NAD Head:  Normocephalic and atraumatic. Respiratory:  Normal work of breathing. Cardiovascular:  RRR  Impression/Plan: [facility] Daniel Austin is here for cataract surgery.  Risks, benefits, limitations, and alternatives regarding cataract surgery have been reviewed with the patient.  Questions have been answered.  All parties agreeable.   Birder Robson, MD  03/07/2021, 8:43 AM

## 2021-03-08 ENCOUNTER — Encounter: Payer: Self-pay | Admitting: Ophthalmology

## 2021-03-14 ENCOUNTER — Ambulatory Visit (INDEPENDENT_AMBULATORY_CARE_PROVIDER_SITE_OTHER): Payer: Medicare Other | Admitting: Internal Medicine

## 2021-03-14 ENCOUNTER — Other Ambulatory Visit: Payer: Self-pay

## 2021-03-14 ENCOUNTER — Encounter: Payer: Self-pay | Admitting: Internal Medicine

## 2021-03-14 VITALS — BP 122/70 | HR 88 | Temp 97.9°F | Resp 16 | Ht 71.0 in | Wt 198.6 lb

## 2021-03-14 DIAGNOSIS — I10 Essential (primary) hypertension: Secondary | ICD-10-CM | POA: Diagnosis not present

## 2021-03-14 DIAGNOSIS — Z0000 Encounter for general adult medical examination without abnormal findings: Secondary | ICD-10-CM

## 2021-03-14 DIAGNOSIS — E039 Hypothyroidism, unspecified: Secondary | ICD-10-CM | POA: Diagnosis not present

## 2021-03-14 DIAGNOSIS — E7800 Pure hypercholesterolemia, unspecified: Secondary | ICD-10-CM

## 2021-03-14 DIAGNOSIS — D649 Anemia, unspecified: Secondary | ICD-10-CM

## 2021-03-14 DIAGNOSIS — E871 Hypo-osmolality and hyponatremia: Secondary | ICD-10-CM | POA: Diagnosis not present

## 2021-03-14 DIAGNOSIS — I6523 Occlusion and stenosis of bilateral carotid arteries: Secondary | ICD-10-CM | POA: Diagnosis not present

## 2021-03-14 DIAGNOSIS — I2510 Atherosclerotic heart disease of native coronary artery without angina pectoris: Secondary | ICD-10-CM | POA: Diagnosis not present

## 2021-03-14 DIAGNOSIS — F439 Reaction to severe stress, unspecified: Secondary | ICD-10-CM

## 2021-03-14 DIAGNOSIS — R739 Hyperglycemia, unspecified: Secondary | ICD-10-CM

## 2021-03-14 DIAGNOSIS — Z125 Encounter for screening for malignant neoplasm of prostate: Secondary | ICD-10-CM | POA: Diagnosis not present

## 2021-03-14 LAB — CBC WITH DIFFERENTIAL/PLATELET
Basophils Absolute: 0.1 10*3/uL (ref 0.0–0.1)
Basophils Relative: 1.4 % (ref 0.0–3.0)
Eosinophils Absolute: 0.3 10*3/uL (ref 0.0–0.7)
Eosinophils Relative: 6.4 % — ABNORMAL HIGH (ref 0.0–5.0)
HCT: 38.4 % — ABNORMAL LOW (ref 39.0–52.0)
Hemoglobin: 12.8 g/dL — ABNORMAL LOW (ref 13.0–17.0)
Lymphocytes Relative: 25.9 % (ref 12.0–46.0)
Lymphs Abs: 1.1 10*3/uL (ref 0.7–4.0)
MCHC: 33.4 g/dL (ref 30.0–36.0)
MCV: 89.4 fl (ref 78.0–100.0)
Monocytes Absolute: 0.5 10*3/uL (ref 0.1–1.0)
Monocytes Relative: 12.5 % — ABNORMAL HIGH (ref 3.0–12.0)
Neutro Abs: 2.3 10*3/uL (ref 1.4–7.7)
Neutrophils Relative %: 53.8 % (ref 43.0–77.0)
Platelets: 276 10*3/uL (ref 150.0–400.0)
RBC: 4.3 Mil/uL (ref 4.22–5.81)
RDW: 13.7 % (ref 11.5–15.5)
WBC: 4.2 10*3/uL (ref 4.0–10.5)

## 2021-03-14 LAB — TSH: TSH: 0.4 u[IU]/mL (ref 0.35–4.50)

## 2021-03-14 LAB — HEMOGLOBIN A1C: Hgb A1c MFr Bld: 6.1 % (ref 4.6–6.5)

## 2021-03-14 LAB — LIPID PANEL
Cholesterol: 137 mg/dL (ref 0–200)
HDL: 54.7 mg/dL (ref >39.00)
LDL Cholesterol: 58 mg/dL (ref 0–99)
NonHDL: 82.68
Total CHOL/HDL Ratio: 3
Triglycerides: 123 mg/dL (ref 0.0–149.0)
VLDL: 24.6 mg/dL (ref 0.0–40.0)

## 2021-03-14 LAB — HEPATIC FUNCTION PANEL
ALT: 8 U/L (ref 0–53)
AST: 14 U/L (ref 0–37)
Albumin: 4.5 g/dL (ref 3.5–5.2)
Alkaline Phosphatase: 90 U/L (ref 39–117)
Bilirubin, Direct: 0.1 mg/dL (ref 0.0–0.3)
Total Bilirubin: 0.6 mg/dL (ref 0.2–1.2)
Total Protein: 7 g/dL (ref 6.0–8.3)

## 2021-03-14 LAB — BASIC METABOLIC PANEL
BUN: 9 mg/dL (ref 6–23)
CO2: 28 mEq/L (ref 19–32)
Calcium: 9.3 mg/dL (ref 8.4–10.5)
Chloride: 98 mEq/L (ref 96–112)
Creatinine, Ser: 0.86 mg/dL (ref 0.40–1.50)
GFR: 81.6 mL/min (ref >60.00)
Glucose, Bld: 91 mg/dL (ref 70–99)
Potassium: 4.8 mEq/L (ref 3.5–5.1)
Sodium: 132 mEq/L — ABNORMAL LOW (ref 135–145)

## 2021-03-14 LAB — PSA, MEDICARE: PSA: 0.37 ng/ml (ref 0.10–4.00)

## 2021-03-14 LAB — FERRITIN: Ferritin: 170 ng/mL (ref 22.0–322.0)

## 2021-03-14 NOTE — Progress Notes (Addendum)
Patient ID: Daniel Austin, male   DOB: 1940/09/30, 80 y.o.   MRN: [id number]   Subjective:    Patient ID: Daniel Austin, male    DOB: [birth year], 80 y.o.   MRN: [id number]  HPI This visit occurred during the SARS-CoV-2 public health emergency.  Safety protocols were in place, including screening questions prior to the visit, additional usage of staff PPE, and extensive cleaning of exam room while observing appropriate contact time as indicated for disinfecting solutions.   Patient here for his physical exam.  States he is doing well.  Increased stress recently with family issues.  Discussed.  He does not feel he needs any further intervention at this time.  Tries to stay active.  No chest pain reported.  Breathing stable.  No increased cough or congestion.  No increased abdominal pain reported.  No bowel issues reported.  No sick contacts.  No fever.  No nausea or vomiting.  He is status post right cataract surgery 03/07/2021.  Continues follow-up with ophthalmology.  Past Medical History:  Diagnosis Date   Anemia    Anxiety    Arthritis    CAD (coronary artery disease)    Cardiomyopathy ([place])    Carotid artery stenosis    high-grade symptomatic right sided s/p stent twice   CHF (congestive heart failure) (HCC)    chronic movement fluid and history of CHF he noticed in the    Depression    Fractures, multiple    Metal rod in left foot   GERD (gastroesophageal reflux disease)    Heart block    Hypercholesterolemia    Hyperglycemia    Hypertension    Hypothyroidism    Knee pain    Presence of permanent cardiac pacemaker    11/16/2015 Medtronic ADR01,   (2008 St Jude)   Shortness of breath dyspnea    Stroke [facility])    clot in neck broke off left side weakness 2008  resolved   Syncope    second degree heart block s/p St Jude's pacemaker placement 12/08 with his history is limited with any   Ulcer of gastric fundus    Past Surgical History:  Procedure Laterality Date   CARDIAC  CATHETERIZATION     CATARACT EXTRACTION W/PHACO Right 03/07/2021   Procedure: CATARACT EXTRACTION PHACO AND INTRAOCULAR LENS PLACEMENT ([place]) RIGHT 8.69 00:55.2;  Surgeon: Birder Robson, MD;  Location: [place];  Service: Ophthalmology;  Laterality: Right;   CORONARY ANGIOPLASTY     stents x 3   heart stint  11/04/2014   [facility]   HERNIA REPAIR     inguinal   INSERT / REPLACE / REMOVE PACEMAKER     JOINT REPLACEMENT     right knee   KNEE ARTHROPLASTY Right 02/08/2016   Procedure: COMPUTER ASSISTED TOTAL KNEE ARTHROPLASTY;  Surgeon: Dereck Leep, MD;  Location: [facility];  Service: Orthopedics;  Laterality: Right;   KNEE ARTHROPLASTY Left 06/27/2016   Procedure: COMPUTER ASSISTED TOTAL KNEE ARTHROPLASTY;  Surgeon: Dereck Leep, MD;  Location: [facility];  Service: Orthopedics;  Laterality: Left;   PACEMAKER INSERTION  08/2007   PACEMAKER INSERTION N/A 11/16/2015   Procedure: INSERTION PACEMAKER/ PACEMAKER CHANGE OUT;  Surgeon: Isaias Cowman, MD;  Location: [facility];  Service: Cardiovascular;  Laterality: N/A;   PERCUTANEOUS PLACEMENT INTRAVASCULAR STENT CERVICAL CAROTID ARTERY     twice, right carotid artery   Family History  Problem Relation Age of Onset   Heart disease Mother  CHF   Heart disease Father        CHF   Thyroid disease Father        hypothyroidism   Colon polyps Father    Diabetes Sister        diet controlled   Thyroid disease Sister    Cancer Sister        breast    Thyroid disease Sister    Cancer Sister        breast   Colon cancer Neg Hx    Prostate cancer Neg Hx    Social History   Socioeconomic History   Marital status: Married    Spouse name: Not on file   Number of children: 1   Years of education: Not on file   Highest education level: Not on file  Occupational History   Not on file  Tobacco Use   Smoking status: Never   Smokeless tobacco: Never  Vaping Use   Vaping Use: Never used  Substance and Sexual  Activity   Alcohol use: No    Alcohol/week: 0.0 standard drinks   Drug use: No   Sexual activity: Not Currently  Other Topics Concern   Not on file  Social History Narrative   Not on file   Social Determinants of Health   Financial Resource Strain: Not on file  Food Insecurity: Not on file  Transportation Needs: Not on file  Physical Activity: Not on file  Stress: Not on file  Social Connections: Not on file    Outpatient Encounter Medications as of 03/14/2021  Medication Sig   acetaminophen (TYLENOL) 500 MG tablet Take 1,000 mg by mouth every 6 (six) hours as needed.   amLODipine (NORVASC) 2.5 MG tablet TAKE 1 TABLET BY MOUTH EVERY DAY   amLODipine (NORVASC) 5 MG tablet Take 1 tablet (5 mg total) by mouth daily.   Aspirin 81 MG EC tablet Take 81 mg by mouth daily.   citalopram (CELEXA) 20 MG tablet TAKE 1 TABLET BY MOUTH EVERY DAY   clopidogrel (PLAVIX) 75 MG tablet TAKE 1 TABLET BY MOUTH EVERY DAY   fexofenadine (ALLEGRA) 180 MG tablet Take 180 mg by mouth daily. As needed   levothyroxine (SYNTHROID) 125 MCG tablet TAKE 1 TABLET (125 MCG TOTAL) BY MOUTH DAILY BEFORE BREAKFAST.   lisinopril (ZESTRIL) 40 MG tablet TAKE 1 TABLET BY MOUTH EVERY DAY   LORazepam (ATIVAN) 1 MG tablet TAKE 1 TABLET BY MOUTH EVERY DAY AT BEDTIME AS NEEDED   metoprolol tartrate (LOPRESSOR) 25 MG tablet TAKE 1 TABLET BY MOUTH TWICE A DAY   naproxen sodium (ANAPROX) 220 MG tablet Take by mouth.   nitroGLYCERIN (NITROSTAT) 0.4 MG SL tablet Place 0.4 mg under the tongue every 5 (five) minutes as needed. If a third tablet is needed please call 911.   pantoprazole (PROTONIX) 40 MG tablet TAKE 1 TABLET BY MOUTH EVERY DAY   Probiotic Product (ALIGN) 4 MG CAPS One tablet per day   rosuvastatin (CRESTOR) 20 MG tablet TAKE 1 TABLET BY MOUTH EVERY DAY AT NIGHT   triamcinolone (NASACORT) 55 MCG/ACT AERO nasal inhaler Place into the nose.   triamcinolone cream (KENALOG) 0.1 % Apply 1 application topically 2 (two)  times daily.   [DISCONTINUED] levothyroxine (SYNTHROID) 112 MCG tablet TAKE 1 TABLET (112 MCG TOTAL) BY MOUTH DAILY BEFORE BREAKFAST.   No facility-administered encounter medications on file as of 03/14/2021.     Review of Systems  Constitutional:  Negative for appetite change and unexpected weight  change.  HENT:  Negative for congestion, sinus pressure and sore throat.   Eyes:  Negative for pain and visual disturbance.  Respiratory:  Negative for cough, chest tightness and shortness of breath.   Cardiovascular:  Negative for chest pain, palpitations and leg swelling.  Gastrointestinal:  Negative for abdominal pain, diarrhea, nausea and vomiting.  Genitourinary:  Negative for difficulty urinating and dysuria.  Musculoskeletal:  Negative for joint swelling and myalgias.  Skin:  Negative for color change and rash.  Neurological:  Negative for dizziness, light-headedness and headaches.  Hematological:  Negative for adenopathy. Does not bruise/bleed easily.  Psychiatric/Behavioral:  Negative for agitation and dysphoric mood.       Objective:    Physical Exam Vitals reviewed.  Constitutional:      General: He is not in acute distress.    Appearance: Normal appearance. He is well-developed.  HENT:     Head: Normocephalic and atraumatic.     Right Ear: External ear normal.     Left Ear: External ear normal.  Eyes:     General:        Right eye: No discharge.        Left eye: No discharge.     Conjunctiva/sclera: Conjunctivae normal.  Neck:     Thyroid: No thyromegaly.  Cardiovascular:     Rate and Rhythm: Normal rate and regular rhythm.  Pulmonary:     Effort: No respiratory distress.     Breath sounds: Normal breath sounds. No wheezing.  Abdominal:     General: Bowel sounds are normal.     Palpations: Abdomen is soft.     Tenderness: There is no abdominal tenderness.  Musculoskeletal:        General: No swelling or tenderness.     Cervical back: Neck supple. No tenderness.   Lymphadenopathy:     Cervical: No cervical adenopathy.  Skin:    Findings: No erythema or rash.  Neurological:     Mental Status: He is alert and oriented to person, place, and time.  Psychiatric:        Mood and Affect: Mood normal.        Behavior: Behavior normal.    BP 122/70   Pulse 88   Temp 97.9 F (36.6 C)   Resp 16   Ht _0  (1.803 m)   Wt 198 lb 9.6 oz (90.1 kg)   SpO2 98%   BMI 27.70 kg/m  Wt Readings from Last 3 Encounters:  03/14/21 198 lb 9.6 oz (90.1 kg)  03/07/21 197 lb 1.6 oz (89.4 kg)  10/03/20 197 lb 9.6 oz (89.6 kg)     Lab Results  Component Value Date   WBC 4.2 03/14/2021   HGB 12.8 (L) 03/14/2021   HCT 38.4 (L) 03/14/2021   PLT 276.0 03/14/2021   GLUCOSE 91 03/14/2021   CHOL 137 03/14/2021   TRIG 123.0 03/14/2021   HDL 54.70 03/14/2021   LDLCALC 58 03/14/2021   ALT 8 03/14/2021   AST 14 03/14/2021   NA 132 (L) 03/14/2021   K 4.8 03/14/2021   CL 98 03/14/2021   CREATININE 0.86 03/14/2021   BUN 9 03/14/2021   CO2 28 03/14/2021   TSH 0.40 03/14/2021   PSA 0.37 03/14/2021   INR 1.13 06/11/2016   HGBA1C 6.1 03/14/2021   MICROALBUR 1.6 05/25/2014      Assessment & Plan:   Problem List Items Addressed This Visit     Anemia    Had previous GI work-up (  2012).  Check CBC.       Relevant Orders   CBC with Differential/Platelet (Completed)   Ferritin (Completed)   Ambulatory referral to Gastroenterology   Carotid artery stenosis    Evaluated by [facility] 05/24/20.  Stable.  Recommended f/u in one year.         Coronary atherosclerosis of native coronary artery    Continue risk factor modification.  Continue crestor, metoprolol and lisinopril.  Followed by cardiology.        Essential hypertension, benign - Primary    Blood pressure as outlined.  Continue lisinopril.  Also taking 39m of amlodipine.  Continue current medication regimen.  Follow pressures.  Follow metabolic panel.        Relevant Orders   Basic metabolic panel  (Completed)   TSH (Completed)   Health care maintenance    Physical today 03/14/21.  Colonoscopy 2012.  Patient requests PSA to be checked today.       Hyperglycemia    Low carb diet and exercise.  Follow met b and a1c.         Relevant Orders   Hemoglobin A1c (Completed)   Hyponatremia    Previously noted to have low sodium.  Recheck today to confirm stable/normal.       Hypothyroidism    On thyroid replacement.  Follow TSH       Pure hypercholesterolemia    On crestor.  Low cholesterol diet and exercise.  Follow lipid panel and liver function tests.         Relevant Orders   Hepatic function panel (Completed)   Lipid panel (Completed)   Stress    Increased stress as outlined.  Discussed with him today.  He is type trying to taper his lorazepam.  Request refill.  Following.       Other Visit Diagnoses     Prostate cancer screening       Relevant Orders   PSA, Medicare (Completed)        CEinar Pheasant MD

## 2021-03-14 NOTE — Assessment & Plan Note (Addendum)
Physical today 03/14/21.  Colonoscopy 2012.  Patient requests PSA to be checked today.

## 2021-03-15 ENCOUNTER — Other Ambulatory Visit: Payer: Self-pay

## 2021-03-15 ENCOUNTER — Other Ambulatory Visit: Payer: Self-pay | Admitting: Internal Medicine

## 2021-03-15 ENCOUNTER — Encounter: Payer: Self-pay | Admitting: Internal Medicine

## 2021-03-15 ENCOUNTER — Telehealth: Payer: Self-pay

## 2021-03-15 DIAGNOSIS — E871 Hypo-osmolality and hyponatremia: Secondary | ICD-10-CM

## 2021-03-15 NOTE — Assessment & Plan Note (Signed)
Had previous GI work-up (2012).  Check CBC.

## 2021-03-15 NOTE — Addendum Note (Signed)
Addended by: Alisa Graff on: 03/15/2021 10:09 PM   Modules accepted: Orders

## 2021-03-15 NOTE — Assessment & Plan Note (Signed)
Increased stress as outlined.  Discussed with him today.  He is type trying to taper his lorazepam.  Request refill.  Following.

## 2021-03-15 NOTE — Telephone Encounter (Signed)
PT called to advise that the he is returning the call about results.

## 2021-03-15 NOTE — Assessment & Plan Note (Signed)
Continue risk factor modification.  Continue crestor, metoprolol and lisinopril.  Followed by cardiology.  

## 2021-03-15 NOTE — Assessment & Plan Note (Addendum)
Blood pressure as outlined.  Continue lisinopril.  Also taking 5mg  of amlodipine.  Continue current medication regimen.  Follow pressures.  Follow metabolic panel.

## 2021-03-15 NOTE — Assessment & Plan Note (Signed)
Previously noted to have low sodium.  Recheck today to confirm stable/normal. 

## 2021-03-15 NOTE — Telephone Encounter (Signed)
LMTCB for labs. 

## 2021-03-15 NOTE — Assessment & Plan Note (Signed)
Evaluated by [facility] 05/24/20.  Stable.  Recommended f/u in one year.

## 2021-03-15 NOTE — Assessment & Plan Note (Signed)
Low carb diet and exercise.  Follow met b and a1c.   

## 2021-03-15 NOTE — Assessment & Plan Note (Signed)
On thyroid replacement.  Follow TSH 

## 2021-03-15 NOTE — Telephone Encounter (Signed)
Pt called returning your call 

## 2021-03-15 NOTE — Telephone Encounter (Signed)
Pt called and labs reviewed 

## 2021-03-15 NOTE — Assessment & Plan Note (Signed)
On crestor.  Low cholesterol diet and exercise.  Follow lipid panel and liver function tests.   

## 2021-03-18 ENCOUNTER — Other Ambulatory Visit: Payer: Self-pay | Admitting: Internal Medicine

## 2021-03-20 NOTE — Telephone Encounter (Signed)
Medication last sent in 01/17/21 and Patient last seen 03/14/21. Please advise

## 2021-03-21 ENCOUNTER — Other Ambulatory Visit: Payer: Medicare Other

## 2021-03-23 ENCOUNTER — Other Ambulatory Visit: Payer: Self-pay

## 2021-03-23 ENCOUNTER — Other Ambulatory Visit (INDEPENDENT_AMBULATORY_CARE_PROVIDER_SITE_OTHER): Payer: Medicare Other

## 2021-03-23 DIAGNOSIS — E871 Hypo-osmolality and hyponatremia: Secondary | ICD-10-CM | POA: Diagnosis not present

## 2021-03-23 LAB — BASIC METABOLIC PANEL
BUN: 9 mg/dL (ref 6–23)
CO2: 27 mEq/L (ref 19–32)
Calcium: 9.1 mg/dL (ref 8.4–10.5)
Chloride: 100 mEq/L (ref 96–112)
Creatinine, Ser: 0.94 mg/dL (ref 0.40–1.50)
GFR: 76.38 mL/min (ref >60.00)
Glucose, Bld: 96 mg/dL (ref 70–99)
Potassium: 4.6 mEq/L (ref 3.5–5.1)
Sodium: 133 mEq/L — ABNORMAL LOW (ref 135–145)

## 2021-04-03 DIAGNOSIS — Z961 Presence of intraocular lens: Secondary | ICD-10-CM | POA: Diagnosis not present

## 2021-04-13 ENCOUNTER — Other Ambulatory Visit: Payer: Self-pay

## 2021-04-13 ENCOUNTER — Ambulatory Visit: Payer: Medicare Other | Admitting: Podiatry

## 2021-04-13 ENCOUNTER — Encounter: Payer: Self-pay | Admitting: Podiatry

## 2021-04-13 DIAGNOSIS — D689 Coagulation defect, unspecified: Secondary | ICD-10-CM | POA: Diagnosis not present

## 2021-04-13 DIAGNOSIS — M21619 Bunion of unspecified foot: Secondary | ICD-10-CM

## 2021-04-13 DIAGNOSIS — B351 Tinea unguium: Secondary | ICD-10-CM

## 2021-04-13 NOTE — Progress Notes (Signed)
This patient returns to my office for at risk foot care.  This patient requires this care by a professional since this patient will be at risk due to having coagulation defect due to plavix.   This patient is unable to cut nails himself since the patient cannot reach his nails.These nails are painful walking and wearing shoes.  This patient presents for at risk foot care today.  General Appearance  Alert, conversant and in no acute stress.  Vascular  Dorsalis pedis and posterior tibial  pulses are palpable  bilaterally.  Capillary return is within normal limits  bilaterally. Temperature is within normal limits  bilaterally.  Neurologic  Senn-Weinstein monofilament wire test within normal limits  bilaterally. Muscle power within normal limits bilaterally.  Nails Thick disfigured discolored nails with subungual debris  from hallux to fifth toes bilaterally. No evidence of bacterial infection or drainage bilaterally.  Orthopedic  No limitations of motion  feet .  No crepitus or effusions noted.  No bony pathology or digital deformities noted.  HAV  B/L.  Skin  normotropic skin with no porokeratosis noted bilaterally.  No signs of infections or ulcers noted.  Asymptomatic callus 1st MPJ  Right foot.   Onychomycosis  Pain in right toes  Pain in left toes  Consent was obtained for treatment procedures.   Mechanical debridement of nails 1-5  bilaterally performed with a nail nipper.  Filed with dremel without incident.    Return office visit   3 months                   Told patient to return for periodic foot care and evaluation due to potential at risk complications.   Gardiner Barefoot DPM

## 2021-05-01 ENCOUNTER — Ambulatory Visit (INDEPENDENT_AMBULATORY_CARE_PROVIDER_SITE_OTHER): Payer: Medicare Other

## 2021-05-01 ENCOUNTER — Other Ambulatory Visit: Payer: Self-pay

## 2021-05-01 VITALS — BP 113/76 | HR 87 | Resp 15 | Ht 71.0 in | Wt 196.6 lb

## 2021-05-01 DIAGNOSIS — Z0000 Encounter for general adult medical examination without abnormal findings: Secondary | ICD-10-CM

## 2021-05-01 NOTE — Patient Instructions (Addendum)
Daniel Austin , Thank you for taking time to come for your Medicare Wellness Visit. I appreciate your ongoing commitment to your health goals. Please review the following plan we discussed and let me know if I can assist you in the future.   These are the goals we discussed:  Goals       Patient Stated     Maintain Healthy Lifestyle (pt-stated)      Stay active Stay hydrated         This is a list of the screening recommended for you and due dates:  Health Maintenance  Topic Date Due   Flu Shot  04/24/2021   COVID-19 Vaccine (4 - Booster for Moderna series) 05/17/2021*   Zoster (Shingles) Vaccine (1 of 2) 06/14/2021*   Tetanus Vaccine  01/20/2029   Pneumonia vaccines  Completed   HPV Vaccine  Aged Out  *Topic was postponed. The date shown is not the original due date.    Advanced directives: not at this time   Conditions/risks identified: none new  Follow up in one year for your annual wellness visit.   Preventive Care 91 Years and Older, Male Preventive care refers to lifestyle choices and visits with your health care provider that can promote health and wellness. What does preventive care include? A yearly physical exam. This is also called an annual well check. Dental exams once or twice a year. Routine eye exams. Ask your health care provider how often you should have your eyes checked. Personal lifestyle choices, including: Daily care of your teeth and gums. Regular physical activity. Eating a healthy diet. Avoiding tobacco and drug use. Limiting alcohol use. Practicing safe sex. Taking low doses of aspirin every day. Taking vitamin and mineral supplements as recommended by your health care provider. What happens during an annual well check? The services and screenings done by your health care provider during your annual well check will depend on your age, overall health, lifestyle risk factors, and family history of disease. Counseling  Your health care provider  may ask you questions about your: Alcohol use. Tobacco use. Drug use. Emotional well-being. Home and relationship well-being. Sexual activity. Eating habits. History of falls. Memory and ability to understand (cognition). Work and work Statistician. Screening  You may have the following tests or measurements: Height, weight, and BMI. Blood pressure. Lipid and cholesterol levels. These may be checked every 5 years, or more frequently if you are over 80 years old. Skin check. Lung cancer screening. You may have this screening every year starting at age 65 if you have a 30-pack-year history of smoking and currently smoke or have quit within the past 15 years. Fecal occult blood test (FOBT) of the stool. You may have this test every year starting at age 36. Flexible sigmoidoscopy or colonoscopy. You may have a sigmoidoscopy every 5 years or a colonoscopy every 10 years starting at age 72. Prostate cancer screening. Recommendations will vary depending on your family history and other risks. Hepatitis C blood test. Hepatitis B blood test. Sexually transmitted disease (STD) testing. Diabetes screening. This is done by checking your blood sugar (glucose) after you have not eaten for a while (fasting). You may have this done every 1-3 years. Abdominal aortic aneurysm (AAA) screening. You may need this if you are a current or former smoker. Osteoporosis. You may be screened starting at age 50 if you are at high risk. Talk with your health care provider about your test results, treatment options, and if necessary,  the need for more tests. Vaccines  Your health care provider may recommend certain vaccines, such as: Influenza vaccine. This is recommended every year. Tetanus, diphtheria, and acellular pertussis (Tdap, Td) vaccine. You may need a Td booster every 10 years. Zoster vaccine. You may need this after age 56. Pneumococcal 13-valent conjugate (PCV13) vaccine. One dose is recommended after  age 72. Pneumococcal polysaccharide (PPSV23) vaccine. One dose is recommended after age 42. Talk to your health care provider about which screenings and vaccines you need and how often you need them. This information is not intended to replace advice given to you by your health care provider. Make sure you discuss any questions you have with your health care provider. Document Released: 10/07/2015 Document Revised: 05/30/2016 Document Reviewed: 07/12/2015 Elsevier Interactive Patient Education  2017 [place] Prevention in the Home Falls can cause injuries. They can happen to people of all ages. There are many things you can do to make your home safe and to help prevent falls. What can I do on the outside of my home? Regularly fix the edges of walkways and driveways and fix any cracks. Remove anything that might make you trip as you walk through a door, such as a raised step or threshold. Trim any bushes or trees on the path to your home. Use bright outdoor lighting. Clear any walking paths of anything that might make someone trip, such as rocks or tools. Regularly check to see if handrails are loose or broken. Make sure that both sides of any steps have handrails. Any raised decks and porches should have guardrails on the edges. Have any leaves, snow, or ice cleared regularly. Use sand or salt on walking paths during winter. Clean up any spills in your garage right away. This includes oil or grease spills. What can I do in the bathroom? Use night lights. Install grab bars by the toilet and in the tub and shower. Do not use towel bars as grab bars. Use non-skid mats or decals in the tub or shower. If you need to sit down in the shower, use a plastic, non-slip stool. Keep the floor dry. Clean up any water that spills on the floor as soon as it happens. Remove soap buildup in the tub or shower regularly. Attach bath mats securely with double-sided non-slip rug tape. Do not have  throw rugs and other things on the floor that can make you trip. What can I do in the bedroom? Use night lights. Make sure that you have a light by your bed that is easy to reach. Do not use any sheets or blankets that are too big for your bed. They should not hang down onto the floor. Have a firm chair that has side arms. You can use this for support while you get dressed. Do not have throw rugs and other things on the floor that can make you trip. What can I do in the kitchen? Clean up any spills right away. Avoid walking on wet floors. Keep items that you use a lot in easy-to-reach places. If you need to reach something above you, use a strong step stool that has a grab bar. Keep electrical cords out of the way. Do not use floor polish or wax that makes floors slippery. If you must use wax, use non-skid floor wax. Do not have throw rugs and other things on the floor that can make you trip. What can I do with my stairs? Do not leave any items on  the stairs. Make sure that there are handrails on both sides of the stairs and use them. Fix handrails that are broken or loose. Make sure that handrails are as long as the stairways. Check any carpeting to make sure that it is firmly attached to the stairs. Fix any carpet that is loose or worn. Avoid having throw rugs at the top or bottom of the stairs. If you do have throw rugs, attach them to the floor with carpet tape. Make sure that you have a light switch at the top of the stairs and the bottom of the stairs. If you do not have them, ask someone to add them for you. What else can I do to help prevent falls? Wear shoes that: Do not have high heels. Have rubber bottoms. Are comfortable and fit you well. Are closed at the toe. Do not wear sandals. If you use a stepladder: Make sure that it is fully opened. Do not climb a closed stepladder. Make sure that both sides of the stepladder are locked into place. Ask someone to hold it for you, if  possible. Clearly mark and make sure that you can see: Any grab bars or handrails. First and last steps. Where the edge of each step is. Use tools that help you move around (mobility aids) if they are needed. These include: Canes. Walkers. Scooters. Crutches. Turn on the lights when you go into a dark area. Replace any light bulbs as soon as they burn out. Set up your furniture so you have a clear path. Avoid moving your furniture around. If any of your floors are uneven, fix them. If there are any pets around you, be aware of where they are. Review your medicines with your doctor. Some medicines can make you feel dizzy. This can increase your chance of falling. Ask your doctor what other things that you can do to help prevent falls. This information is not intended to replace advice given to you by your health care provider. Make sure you discuss any questions you have with your health care provider. Document Released: 07/07/2009 Document Revised: 02/16/2016 Document Reviewed: 10/15/2014 Elsevier Interactive Patient Education  2017 [employer].

## 2021-05-01 NOTE — Progress Notes (Signed)
Subjective:   Daniel Austin is a 80 y.o. male who presents for Medicare Annual/Subsequent preventive examination.  Review of Systems    No ROS.  Medicare Wellness Cardiac Risk Factors include: advanced age (>78mn, >>35women);male gender;hypertension     Objective:    Today's Vitals   05/01/21 1320  BP: 113/76  Pulse: 87  Resp: 15  SpO2: 97%  Weight: 196 lb 9.6 oz (89.2 kg)  Height: '5\' 11"'$  (1.803 m)   Body mass index is 27.42 kg/m.  Advanced Directives 05/01/2021 04/20/2020 02/05/2020 09/12/2018 09/12/2017 11/09/2016 09/11/2016  Does Patient Have a Medical Advance Directive? No No No No No No No  Would patient like information on creating a medical advance directive? No - Patient declined - Yes (MAU/Ambulatory/Procedural Areas - Information given) No - Patient declined Yes (MAU/Ambulatory/Procedural Areas - Information given) - No - Patient declined    Current Medications (verified) Outpatient Encounter Medications as of 05/01/2021  Medication Sig   acetaminophen (TYLENOL) 500 MG tablet Take 1,000 mg by mouth every 6 (six) hours as needed.   amLODipine (NORVASC) 2.5 MG tablet TAKE 1 TABLET BY MOUTH EVERY DAY   amLODipine (NORVASC) 5 MG tablet Take 1 tablet (5 mg total) by mouth daily.   Aspirin 81 MG EC tablet Take 81 mg by mouth daily.   citalopram (CELEXA) 20 MG tablet TAKE 1 TABLET BY MOUTH EVERY DAY   clopidogrel (PLAVIX) 75 MG tablet TAKE 1 TABLET BY MOUTH EVERY DAY   fexofenadine (ALLEGRA) 180 MG tablet Take 180 mg by mouth daily. As needed   levothyroxine (SYNTHROID) 125 MCG tablet TAKE 1 TABLET (125 MCG TOTAL) BY MOUTH DAILY BEFORE BREAKFAST.   lisinopril (ZESTRIL) 40 MG tablet TAKE 1 TABLET BY MOUTH EVERY DAY   LORazepam (ATIVAN) 1 MG tablet TAKE 1 TABLET BY MOUTH EVERY DAY AT BEDTIME AS NEEDED   metoprolol tartrate (LOPRESSOR) 25 MG tablet TAKE 1 TABLET BY MOUTH TWICE A DAY   naproxen sodium (ANAPROX) 220 MG tablet Take by mouth.   nitroGLYCERIN (NITROSTAT) 0.4 MG  SL tablet Place 0.4 mg under the tongue every 5 (five) minutes as needed. If a third tablet is needed please call 911.   pantoprazole (PROTONIX) 40 MG tablet TAKE 1 TABLET BY MOUTH EVERY DAY   Probiotic Product (ALIGN) 4 MG CAPS One tablet per day   rosuvastatin (CRESTOR) 20 MG tablet TAKE 1 TABLET BY MOUTH EVERY DAY AT NIGHT   triamcinolone (NASACORT) 55 MCG/ACT AERO nasal inhaler Place into the nose.   triamcinolone cream (KENALOG) 0.1 % Apply 1 application topically 2 (two) times daily.   No facility-administered encounter medications on file as of 05/01/2021.    Allergies (verified) Beta adrenergic blockers and Lipitor [atorvastatin]   History: Past Medical History:  Diagnosis Date   Anemia    Anxiety    Arthritis    CAD (coronary artery disease)    [place]    Carotid artery stenosis    high-grade symptomatic right sided s/p stent twice   CHF (congestive heart failure) (HCC)    chronic movement fluid and history of CHF he noticed in the    Depression    Fractures, multiple    Metal rod in left foot   GERD (gastroesophageal reflux disease)    Heart block    Hypercholesterolemia    Hyperglycemia    Hypertension    Hypothyroidism    Knee pain    Presence of permanent cardiac pacemaker    11/16/2015 Medtronic  ADR01,   (2008 St Jude)   Shortness of breath dyspnea    Stroke [facility])    clot in neck broke off left side weakness 2008  resolved   Syncope    second degree heart block s/p St Jude's pacemaker placement 12/08 with his history is limited with any   Ulcer of gastric fundus    Past Surgical History:  Procedure Laterality Date   CARDIAC CATHETERIZATION     CATARACT EXTRACTION W/PHACO Right 03/07/2021   Procedure: CATARACT EXTRACTION PHACO AND INTRAOCULAR LENS PLACEMENT ([place]) RIGHT 8.69 00:55.2;  Surgeon: Birder Robson, MD;  Location: [facility];  Service: Ophthalmology;  Laterality: Right;   CORONARY ANGIOPLASTY     stents x 3   heart stint   11/04/2014   [facility]   HERNIA REPAIR     inguinal   INSERT / REPLACE / REMOVE PACEMAKER     JOINT REPLACEMENT     right knee   KNEE ARTHROPLASTY Right 02/08/2016   Procedure: COMPUTER ASSISTED TOTAL KNEE ARTHROPLASTY;  Surgeon: Dereck Leep, MD;  Location: [facility];  Service: Orthopedics;  Laterality: Right;   KNEE ARTHROPLASTY Left 06/27/2016   Procedure: COMPUTER ASSISTED TOTAL KNEE ARTHROPLASTY;  Surgeon: Dereck Leep, MD;  Location: [facility];  Service: Orthopedics;  Laterality: Left;   PACEMAKER INSERTION  08/2007   PACEMAKER INSERTION N/A 11/16/2015   Procedure: INSERTION PACEMAKER/ PACEMAKER CHANGE OUT;  Surgeon: Isaias Cowman, MD;  Location: [facility];  Service: Cardiovascular;  Laterality: N/A;   PERCUTANEOUS PLACEMENT INTRAVASCULAR STENT CERVICAL CAROTID ARTERY     twice, right carotid artery   Family History  Problem Relation Age of Onset   Heart disease Mother        CHF   Heart disease Father        CHF   Thyroid disease Father        hypothyroidism   Colon polyps Father    Diabetes Sister        diet controlled   Thyroid disease Sister    Cancer Sister        breast    Thyroid disease Sister    Cancer Sister        breast   Colon cancer Neg Hx    Prostate cancer Neg Hx    Social History   Socioeconomic History   Marital status: Married    Spouse name: Not on file   Number of children: 1   Years of education: Not on file   Highest education level: Not on file  Occupational History   Not on file  Tobacco Use   Smoking status: Never   Smokeless tobacco: Never  Vaping Use   Vaping Use: Never used  Substance and Sexual Activity   Alcohol use: No    Alcohol/week: 0.0 standard drinks   Drug use: No   Sexual activity: Not Currently  Other Topics Concern   Not on file  Social History Narrative   Not on file   Social Determinants of Health   Financial Resource Strain: Not on file  Food Insecurity: Not on file  Transportation Needs: Not  on file  Physical Activity: Not on file  Stress: Not on file  Social Connections: Not on file    Tobacco Counseling Counseling given: Not Answered   Clinical Intake:  Pre-visit preparation completed: Yes        Diabetes: No  How often do you need to have someone help you when you read instructions,  pamphlets, or other written materials from your doctor or pharmacy?: 1 - Never  Interpreter Needed?: No      Activities of Daily Living In your present state of health, do you have any difficulty performing the following activities: 05/01/2021  Hearing? N  Vision? N  Difficulty concentrating or making decisions? N  Walking or climbing stairs? N  Dressing or bathing? N  Doing errands, shopping? N  Preparing Food and eating ? N  Using the Toilet? N  In the past six months, have you accidently leaked urine? N  Do you have problems with loss of bowel control? N  Managing your Medications? N  Managing your Finances? N  Housekeeping or managing your Housekeeping? N  Some recent data might be hidden    Patient Care Team: Einar Pheasant, MD as PCP - General (Internal Medicine)  Indicate any recent Medical Services you may have received from other than [facility] providers in the past year (date may be approximate).     Assessment:   This is a routine wellness examination for Daniel Austin.  Hearing/Vision screen Hearing Screening - Comments:: Patient is able to hear conversational tones without difficulty. No issues reported. Vision Screening - Comments:: Followed by [facility]  Blindness L eye  Wears corrective lenses  Dietary issues and exercise activities discussed: Current Exercise Habits: Home exercise routine, Type of exercise: walking, Intensity: Mild Regular diet Good fluid intake  Goals Addressed               This Visit's Progress     Patient Stated     Maintain Healthy Lifestyle (pt-stated)        Stay active Stay hydrated        Depression  Screen PHQ 2/9 Scores 05/01/2021 10/03/2020 02/05/2020 09/12/2018 09/12/2017 05/03/2017 09/11/2016  PHQ - 2 Score 0 0 0 0 0 0 1  PHQ- 9 Score - - - - 0 - -    Fall Risk Fall Risk  05/01/2021 10/03/2020 02/05/2020 09/15/2019 09/12/2018  Falls in the past year? 0 1 0 0 0  Number falls in past yr: - 1 0 - -  Injury with Fall? 0 0 - - -  Follow up Falls evaluation completed Falls evaluation completed Falls evaluation completed Falls evaluation completed -    FALL RISK PREVENTION PERTAINING TO THE HOME: Adequate lighting in your home to reduce risk of falls? Yes   ASSISTIVE DEVICES UTILIZED TO PREVENT FALLS: Life alert? No  Use of a cane, walker or w/c? No  Grab bars in the bathroom? Yes  Shower chair or bench in shower? No  Elevated toilet seat or a handicapped toilet? No   TIMED UP AND GO: Was the test performed? Yes .  Length of time to ambulate 10 feet: 10 sec.   Gait slow and steady without use of assistive device  Cognitive Function: MMSE - Mini Mental State Exam 09/12/2017  Orientation to time 5  Orientation to Place 5  Registration 3  Attention/ Calculation 5  Recall 3  Language- name 2 objects 2  Language- repeat 1  Language- follow 3 step command 3  Language- read & follow direction 1  Write a sentence 1  Copy design 1  Total score 30     6CIT Screen 05/01/2021 02/05/2020 09/12/2018 09/11/2016  What Year? 0 points 0 points 0 points 0 points  What month? 0 points 0 points 0 points 0 points  What time? 0 points - 0 points 0 points  Count  back from 20 0 points 0 points 0 points 0 points  Months in reverse 0 points - 0 points 0 points  Repeat phrase - 2 points 0 points -  Total Score - - 0 -    Immunizations Immunization History  Administered Date(s) Administered   DTaP 11/04/2006   Fluad Quad(high Dose 65+) 05/30/2019, 06/14/2020   Influenza Nasal 09/03/2012, 07/06/2013   Influenza, High Dose Seasonal PF 10/25/2016, 09/25/2017, 06/20/2018   Influenza,inj,Quad PF,6+  Mos 05/28/2014, 05/25/2015   Influenza-Unspecified 07/22/2012, 07/17/2013, 05/28/2014, 05/25/2015   Moderna Sars-Covid-2 Vaccination 04/14/2020, 05/12/2020, 11/16/2020   Pneumococcal Conjugate-13 01/10/2017   Pneumococcal Polysaccharide-23 04/18/2018   Td 01/21/2019    Health Maintenance Health Maintenance  Topic Date Due   INFLUENZA VACCINE  04/24/2021   COVID-19 Vaccine (4 - Booster for Moderna series) 05/17/2021 (Originally 02/13/2021)   Zoster Vaccines- Shingrix (1 of 2) 06/14/2021 (Originally 05/18/1960)   TETANUS/TDAP  01/20/2029   PNA vac Low Risk Adult  Completed   HPV VACCINES  Aged Out   Lung Cancer Screening: (Low Dose CT Chest recommended if Age 61-80 years, 30 pack-year currently smoking OR have quit w/in 15years.) does not qualify.   Hepatitis C Screening: does not qualify  Vision Screening: Recommended annual ophthalmology exams for early detection of glaucoma and other disorders of the eye.  Dental Screening: Recommended annual dental exams for proper oral hygiene  Community Resource Referral / Chronic Care Management: CRR required this visit?  No   CCM required this visit?  No      Plan:   Keep all routine maintenance appointments.   I have personally reviewed and noted the following in the patient's chart:   Medical and social history Use of alcohol, tobacco or illicit drugs  Current medications and supplements including opioid prescriptions. Patient is not currently taking opioid prescriptions. Functional ability and status Nutritional status Physical activity Advanced directives List of other physicians Hospitalizations, surgeries, and ER visits in previous 12 months Vitals Screenings to include cognitive, depression, and falls Referrals and appointments  In addition, I have reviewed and discussed with patient certain preventive protocols, quality metrics, and best practice recommendations. A written personalized care plan for preventive services  as well as general preventive health recommendations were provided to patient.     Varney Biles, LPN   [phone]

## 2021-05-17 ENCOUNTER — Other Ambulatory Visit: Payer: Self-pay | Admitting: Internal Medicine

## 2021-05-17 NOTE — Telephone Encounter (Signed)
Rx ok'd for lorazepam #30 with one refill.

## 2021-05-17 NOTE — Telephone Encounter (Signed)
RX Refill:ativan Last Seen:03-14-21 Last ordered:03-21-21

## 2021-05-18 DIAGNOSIS — Z7902 Long term (current) use of antithrombotics/antiplatelets: Secondary | ICD-10-CM | POA: Diagnosis not present

## 2021-05-18 DIAGNOSIS — D649 Anemia, unspecified: Secondary | ICD-10-CM | POA: Diagnosis not present

## 2021-05-18 DIAGNOSIS — Z950 Presence of cardiac pacemaker: Secondary | ICD-10-CM | POA: Diagnosis not present

## 2021-05-18 DIAGNOSIS — Z8711 Personal history of peptic ulcer disease: Secondary | ICD-10-CM | POA: Diagnosis not present

## 2021-05-18 DIAGNOSIS — Z86010 Personal history of colonic polyps: Secondary | ICD-10-CM | POA: Diagnosis not present

## 2021-05-18 DIAGNOSIS — K219 Gastro-esophageal reflux disease without esophagitis: Secondary | ICD-10-CM | POA: Diagnosis not present

## 2021-05-18 DIAGNOSIS — K5730 Diverticulosis of large intestine without perforation or abscess without bleeding: Secondary | ICD-10-CM | POA: Diagnosis not present

## 2021-05-23 ENCOUNTER — Ambulatory Visit (INDEPENDENT_AMBULATORY_CARE_PROVIDER_SITE_OTHER): Payer: Medicare Other

## 2021-05-23 ENCOUNTER — Encounter (INDEPENDENT_AMBULATORY_CARE_PROVIDER_SITE_OTHER): Payer: Self-pay | Admitting: Vascular Surgery

## 2021-05-23 ENCOUNTER — Other Ambulatory Visit: Payer: Self-pay

## 2021-05-23 ENCOUNTER — Ambulatory Visit (INDEPENDENT_AMBULATORY_CARE_PROVIDER_SITE_OTHER): Payer: Medicare Other | Admitting: Vascular Surgery

## 2021-05-23 VITALS — BP 106/68 | HR 74 | Resp 16 | Wt 195.0 lb

## 2021-05-23 DIAGNOSIS — I6523 Occlusion and stenosis of bilateral carotid arteries: Secondary | ICD-10-CM

## 2021-05-23 DIAGNOSIS — I10 Essential (primary) hypertension: Secondary | ICD-10-CM

## 2021-05-23 DIAGNOSIS — E785 Hyperlipidemia, unspecified: Secondary | ICD-10-CM | POA: Diagnosis not present

## 2021-05-23 NOTE — Progress Notes (Signed)
MRN : [id number]  Daniel Austin is a 80 y.o. (1941/07/23) male who presents with chief complaint of  Chief Complaint  Patient presents with   Follow-up    Ultrasound follow up  .  History of Present Illness: Patient returns in follow-up of his carotid disease.  He is doing well.  He has no focal neurologic symptoms currently.  He had a right carotid stent placed approximately 14 years ago and has done well.  He was having focal neurologic symptoms at that time but has not had any symptoms in many years.  No new complaints today.  Duplex today shows the right carotid stent to be widely patent with stable 1 to 39% left ICA stenosis.  Current Outpatient Medications  Medication Sig Dispense Refill   acetaminophen (TYLENOL) 500 MG tablet Take 1,000 mg by mouth every 6 (six) hours as needed.     amLODipine (NORVASC) 2.5 MG tablet TAKE 1 TABLET BY MOUTH EVERY DAY 90 tablet 1   amLODipine (NORVASC) 5 MG tablet Take 1 tablet (5 mg total) by mouth daily. 90 tablet 3   Aspirin 81 MG EC tablet Take 81 mg by mouth daily.     citalopram (CELEXA) 20 MG tablet TAKE 1 TABLET BY MOUTH EVERY DAY 90 tablet 2   clopidogrel (PLAVIX) 75 MG tablet TAKE 1 TABLET BY MOUTH EVERY DAY 90 tablet 1   fexofenadine (ALLEGRA) 180 MG tablet Take 180 mg by mouth daily. As needed     levothyroxine (SYNTHROID) 125 MCG tablet TAKE 1 TABLET (125 MCG TOTAL) BY MOUTH DAILY BEFORE BREAKFAST. 90 tablet 1   lisinopril (ZESTRIL) 40 MG tablet TAKE 1 TABLET BY MOUTH EVERY DAY 90 tablet 1   LORazepam (ATIVAN) 1 MG tablet TAKE 1 TABLET BY MOUTH EVERY DAY AT BEDTIME AS NEEDED 30 tablet 1   metoprolol tartrate (LOPRESSOR) 25 MG tablet TAKE 1 TABLET BY MOUTH TWICE A DAY 180 tablet 1   naproxen sodium (ANAPROX) 220 MG tablet Take by mouth.     nitroGLYCERIN (NITROSTAT) 0.4 MG SL tablet Place 0.4 mg under the tongue every 5 (five) minutes as needed. If a third tablet is needed please call 911.     pantoprazole (PROTONIX) 40 MG tablet  TAKE 1 TABLET BY MOUTH EVERY DAY 90 tablet 2   Probiotic Product (ALIGN) 4 MG CAPS One tablet per day 30 capsule 0   rosuvastatin (CRESTOR) 20 MG tablet TAKE 1 TABLET BY MOUTH EVERY DAY AT NIGHT 90 tablet 2   triamcinolone (NASACORT) 55 MCG/ACT AERO nasal inhaler Place into the nose.     triamcinolone cream (KENALOG) 0.1 % Apply 1 application topically 2 (two) times daily. 30 g 0   No current facility-administered medications for this visit.    Past Medical History:  Diagnosis Date   Anemia    Anxiety    Arthritis    CAD (coronary artery disease)    Cardiomyopathy ([place])    Carotid artery stenosis    high-grade symptomatic right sided s/p stent twice   CHF (congestive heart failure) (HCC)    chronic movement fluid and history of CHF he noticed in the    Depression    Fractures, multiple    Metal rod in left foot   GERD (gastroesophageal reflux disease)    Heart block    Hypercholesterolemia    Hyperglycemia    Hypertension    Hypothyroidism    Knee pain    Presence of permanent cardiac pacemaker  11/16/2015 Medtronic ADR01,   (2008 St Jude)   Shortness of breath dyspnea    Stroke [facility])    clot in neck broke off left side weakness 2008  resolved   Syncope    second degree heart block s/p St Jude's pacemaker placement 12/08 with his history is limited with any   Ulcer of gastric fundus     Past Surgical History:  Procedure Laterality Date   CARDIAC CATHETERIZATION     CATARACT EXTRACTION W/PHACO Right 03/07/2021   Procedure: CATARACT EXTRACTION PHACO AND INTRAOCULAR LENS PLACEMENT ([place]) RIGHT 8.69 00:55.2;  Surgeon: Birder Robson, MD;  Location: [facility];  Service: Ophthalmology;  Laterality: Right;   CORONARY ANGIOPLASTY     stents x 3   heart stint  11/04/2014   [facility]   HERNIA REPAIR     inguinal   INSERT / REPLACE / REMOVE PACEMAKER     JOINT REPLACEMENT     right knee   KNEE ARTHROPLASTY Right 02/08/2016   Procedure: COMPUTER ASSISTED TOTAL  KNEE ARTHROPLASTY;  Surgeon: Dereck Leep, MD;  Location: [facility];  Service: Orthopedics;  Laterality: Right;   KNEE ARTHROPLASTY Left 06/27/2016   Procedure: COMPUTER ASSISTED TOTAL KNEE ARTHROPLASTY;  Surgeon: Dereck Leep, MD;  Location: [facility];  Service: Orthopedics;  Laterality: Left;   PACEMAKER INSERTION  08/2007   PACEMAKER INSERTION N/A 11/16/2015   Procedure: INSERTION PACEMAKER/ PACEMAKER CHANGE OUT;  Surgeon: Isaias Cowman, MD;  Location: [facility];  Service: Cardiovascular;  Laterality: N/A;   PERCUTANEOUS PLACEMENT INTRAVASCULAR STENT CERVICAL CAROTID ARTERY     twice, right carotid artery     Social History   Tobacco Use   Smoking status: Never   Smokeless tobacco: Never  Vaping Use   Vaping Use: Never used  Substance Use Topics   Alcohol use: No    Alcohol/week: 0.0 standard drinks   Drug use: No       Family History  Problem Relation Age of Onset   Heart disease Mother        CHF   Heart disease Father        CHF   Thyroid disease Father        hypothyroidism   Colon polyps Father    Diabetes Sister        diet controlled   Thyroid disease Sister    Cancer Sister        breast    Thyroid disease Sister    Cancer Sister        breast   Colon cancer Neg Hx    Prostate cancer Neg Hx      Allergies  Allergen Reactions   Beta Adrenergic Blockers Other (See Comments)    Second Degree Heart Block   Lipitor [Atorvastatin] Other (See Comments)    Myalgia     REVIEW OF SYSTEMS (Negative unless checked)   Constitutional: '[]'$ Weight loss  '[]'$ Fever  '[]'$ Chills Cardiac: '[]'$ Chest pain   '[]'$ Chest pressure   '[]'$ Palpitations   '[]'$ Shortness of breath when laying flat   '[]'$ Shortness of breath at rest   '[]'$ Shortness of breath with exertion. Vascular:  '[]'$ Pain in legs with walking   '[]'$ Pain in legs at rest   '[]'$ Pain in legs when laying flat   '[]'$ Claudication   '[]'$ Pain in feet when walking  '[]'$ Pain in feet at rest  '[]'$ Pain in feet when laying flat   '[]'$ History of DVT    '[]'$ Phlebitis   '[]'$ Swelling in legs   '[]'$ Varicose veins   '[]'$   Non-healing ulcers Pulmonary:   '[]'$ Uses home oxygen   '[]'$ Productive cough   '[]'$ Hemoptysis   '[]'$ Wheeze  '[]'$ COPD   '[]'$ Asthma Neurologic:  '[]'$ Dizziness  '[]'$ Blackouts   '[]'$ Seizures   '[]'$ History of stroke   '[x]'$ History of TIA  '[]'$ Aphasia   '[]'$ Temporary blindness   '[]'$ Dysphagia   '[x]'$ Weakness or numbness in arms   '[]'$ Weakness or numbness in legs Musculoskeletal:  '[x]'$ Arthritis   '[]'$ Joint swelling   '[]'$ Joint pain   '[]'$ Low back pain Hematologic:  '[]'$ Easy bruising  '[]'$ Easy bleeding   '[]'$ Hypercoagulable state   '[]'$ Anemic  '[]'$ Hepatitis Gastrointestinal:  '[]'$ Blood in stool   '[]'$ Vomiting blood  '[]'$ Gastroesophageal reflux/heartburn   '[]'$ Difficulty swallowing. Genitourinary:  '[]'$ Chronic kidney disease   '[]'$ Difficult urination  '[]'$ Frequent urination  '[]'$ Burning with urination   '[]'$ Blood in urine Skin:  '[]'$ Rashes   '[]'$ Ulcers   '[]'$ Wounds Psychological:  '[]'$ History of anxiety   '[]'$  History of major depression.    Physical Examination  Vitals:   05/23/21 1332  BP: 106/68  Pulse: 74  Resp: 16  Weight: 195 lb (88.5 kg)   Body mass index is 27.2 kg/m. Gen:  WD/WN, NAD.  Appears younger than stated age Head: [place]/AT, No temporalis wasting. Ear/Nose/Throat: Hearing grossly intact, nares w/o erythema or drainage, trachea midline Eyes: Conjunctiva clear. Sclera non-icteric Neck: Supple.  No bruit  Pulmonary:  Good air movement, equal and clear to auscultation bilaterally.  Cardiac: RRR, No JVD Vascular:  Vessel Right Left  Radial Palpable Palpable   Musculoskeletal: M/S 5/5 throughout.  No deformity or atrophy.  No edema. Neurologic: CN 2-12 intact. Sensation grossly intact in extremities.  Symmetrical.  Speech is fluent. Motor exam as listed above. Psychiatric: Judgment intact, Mood & affect appropriate for pt's clinical situation. Dermatologic: No rashes or ulcers noted.  No cellulitis or open wounds.     CBC Lab Results  Component Value Date   WBC 4.2 03/14/2021   HGB 12.8 (L)  03/14/2021   HCT 38.4 (L) 03/14/2021   MCV 89.4 03/14/2021   PLT 276.0 03/14/2021    BMET    Component Value Date/Time   NA 133 (L) 03/23/2021 1115   K 4.6 03/23/2021 1115   CL 100 03/23/2021 1115   CO2 27 03/23/2021 1115   GLUCOSE 96 03/23/2021 1115   BUN 9 03/23/2021 1115   CREATININE 0.94 03/23/2021 1115   CALCIUM 9.1 03/23/2021 1115   GFRNONAA >60 04/20/2020 1816   GFRAA >60 04/20/2020 1816   CrCl cannot be calculated (Patient's most recent lab result is older than the maximum 21 days allowed.).  COAG Lab Results  Component Value Date   INR 1.13 06/11/2016   INR 1.14 01/25/2016   INR 1.08 11/09/2015    Radiology No results found.   Assessment/Plan HLD (hyperlipidemia) lipid control important in reducing the progression of atherosclerotic disease. Continue statin therapy     Benign essential HTN blood pressure control important in reducing the progression of atherosclerotic disease. On appropriate oral medications.  Carotid artery stenosis Duplex today shows the right carotid stent to be widely patent with stable 1 to 39% left ICA stenosis.  Doing well.  Continue current medical regimen which includes aspirin and Plavix.  Recheck in 1 year.    Leotis Pain, MD  05/23/2021 3:33 PM    This note was created with Dragon medical transcription system.  Any errors from dictation are purely unintentional

## 2021-05-23 NOTE — Assessment & Plan Note (Signed)
Duplex today shows the right carotid stent to be widely patent with stable 1 to 39% left ICA stenosis.  Doing well.  Continue current medical regimen which includes aspirin and Plavix.  Recheck in 1 year.

## 2021-06-12 ENCOUNTER — Ambulatory Visit
Admission: EM | Admit: 2021-06-12 | Discharge: 2021-06-12 | Disposition: A | Discharge status: Other Acute Inpatient Hospital | Payer: Medicare Other | Attending: Family Medicine | Admitting: Family Medicine

## 2021-06-12 ENCOUNTER — Observation Stay
Admission: EM | Admit: 2021-06-12 | Discharge: 2021-06-13 | Disposition: A | Discharge status: Home / Self Care | Payer: Medicare Other | Attending: Student | Admitting: Student

## 2021-06-12 ENCOUNTER — Emergency Department: Payer: Medicare Other

## 2021-06-12 ENCOUNTER — Other Ambulatory Visit: Payer: Self-pay

## 2021-06-12 ENCOUNTER — Telehealth: Payer: Self-pay | Admitting: Internal Medicine

## 2021-06-12 ENCOUNTER — Encounter: Payer: Self-pay | Admitting: Emergency Medicine

## 2021-06-12 DIAGNOSIS — R569 Unspecified convulsions: Secondary | ICD-10-CM

## 2021-06-12 DIAGNOSIS — I5020 Unspecified systolic (congestive) heart failure: Secondary | ICD-10-CM | POA: Insufficient documentation

## 2021-06-12 DIAGNOSIS — Z8673 Personal history of transient ischemic attack (TIA), and cerebral infarction without residual deficits: Secondary | ICD-10-CM | POA: Insufficient documentation

## 2021-06-12 DIAGNOSIS — Z7902 Long term (current) use of antithrombotics/antiplatelets: Secondary | ICD-10-CM | POA: Insufficient documentation

## 2021-06-12 DIAGNOSIS — Z955 Presence of coronary angioplasty implant and graft: Secondary | ICD-10-CM | POA: Insufficient documentation

## 2021-06-12 DIAGNOSIS — S61256A Open bite of right little finger without damage to nail, initial encounter: Principal | ICD-10-CM | POA: Insufficient documentation

## 2021-06-12 DIAGNOSIS — Z20822 Contact with and (suspected) exposure to covid-19: Secondary | ICD-10-CM | POA: Diagnosis not present

## 2021-06-12 DIAGNOSIS — R4020 Unspecified coma: Secondary | ICD-10-CM

## 2021-06-12 DIAGNOSIS — I2510 Atherosclerotic heart disease of native coronary artery without angina pectoris: Secondary | ICD-10-CM | POA: Insufficient documentation

## 2021-06-12 DIAGNOSIS — Z96652 Presence of left artificial knee joint: Secondary | ICD-10-CM | POA: Insufficient documentation

## 2021-06-12 DIAGNOSIS — Z7982 Long term (current) use of aspirin: Secondary | ICD-10-CM | POA: Diagnosis not present

## 2021-06-12 DIAGNOSIS — H579 Unspecified disorder of eye and adnexa: Secondary | ICD-10-CM | POA: Diagnosis not present

## 2021-06-12 DIAGNOSIS — I6529 Occlusion and stenosis of unspecified carotid artery: Secondary | ICD-10-CM | POA: Diagnosis present

## 2021-06-12 DIAGNOSIS — I672 Cerebral atherosclerosis: Secondary | ICD-10-CM | POA: Diagnosis not present

## 2021-06-12 DIAGNOSIS — Z79899 Other long term (current) drug therapy: Secondary | ICD-10-CM | POA: Diagnosis not present

## 2021-06-12 DIAGNOSIS — I10 Essential (primary) hypertension: Secondary | ICD-10-CM | POA: Diagnosis not present

## 2021-06-12 DIAGNOSIS — R404 Transient alteration of awareness: Secondary | ICD-10-CM | POA: Diagnosis not present

## 2021-06-12 DIAGNOSIS — R6889 Other general symptoms and signs: Secondary | ICD-10-CM | POA: Diagnosis not present

## 2021-06-12 DIAGNOSIS — I110 Hypertensive heart disease with heart failure: Secondary | ICD-10-CM | POA: Insufficient documentation

## 2021-06-12 DIAGNOSIS — W540XXA Bitten by dog, initial encounter: Secondary | ICD-10-CM | POA: Diagnosis not present

## 2021-06-12 DIAGNOSIS — I5022 Chronic systolic (congestive) heart failure: Secondary | ICD-10-CM

## 2021-06-12 DIAGNOSIS — R55 Syncope and collapse: Secondary | ICD-10-CM | POA: Insufficient documentation

## 2021-06-12 DIAGNOSIS — E039 Hypothyroidism, unspecified: Secondary | ICD-10-CM | POA: Diagnosis not present

## 2021-06-12 DIAGNOSIS — S61236A Puncture wound without foreign body of right little finger without damage to nail, initial encounter: Secondary | ICD-10-CM | POA: Diagnosis not present

## 2021-06-12 DIAGNOSIS — I255 Ischemic cardiomyopathy: Secondary | ICD-10-CM | POA: Diagnosis present

## 2021-06-12 DIAGNOSIS — Z950 Presence of cardiac pacemaker: Secondary | ICD-10-CM | POA: Insufficient documentation

## 2021-06-12 DIAGNOSIS — S61216A Laceration without foreign body of right little finger without damage to nail, initial encounter: Secondary | ICD-10-CM | POA: Diagnosis not present

## 2021-06-12 DIAGNOSIS — Z23 Encounter for immunization: Secondary | ICD-10-CM | POA: Insufficient documentation

## 2021-06-12 DIAGNOSIS — M7989 Other specified soft tissue disorders: Secondary | ICD-10-CM | POA: Diagnosis not present

## 2021-06-12 DIAGNOSIS — M810 Age-related osteoporosis without current pathological fracture: Secondary | ICD-10-CM | POA: Diagnosis not present

## 2021-06-12 DIAGNOSIS — Z743 Need for continuous supervision: Secondary | ICD-10-CM | POA: Diagnosis not present

## 2021-06-12 DIAGNOSIS — S069X9A Unspecified intracranial injury with loss of consciousness of unspecified duration, initial encounter: Secondary | ICD-10-CM | POA: Diagnosis not present

## 2021-06-12 DIAGNOSIS — Z95828 Presence of other vascular implants and grafts: Secondary | ICD-10-CM

## 2021-06-12 LAB — CBC WITH DIFFERENTIAL/PLATELET
Abs Immature Granulocytes: 0.02 10*3/uL (ref 0.00–0.07)
Basophils Absolute: 0.1 10*3/uL (ref 0.0–0.1)
Basophils Relative: 1 %
Eosinophils Absolute: 0.4 10*3/uL (ref 0.0–0.5)
Eosinophils Relative: 6 %
HCT: 36.5 % — ABNORMAL LOW (ref 39.0–52.0)
Hemoglobin: 12.4 g/dL — ABNORMAL LOW (ref 13.0–17.0)
Immature Granulocytes: 0 %
Lymphocytes Relative: 28 %
Lymphs Abs: 1.7 10*3/uL (ref 0.7–4.0)
MCH: 30 pg (ref 26.0–34.0)
MCHC: 34 g/dL (ref 30.0–36.0)
MCV: 88.2 fL (ref 80.0–100.0)
Monocytes Absolute: 0.6 10*3/uL (ref 0.1–1.0)
Monocytes Relative: 9 %
Neutro Abs: 3.5 10*3/uL (ref 1.7–7.7)
Neutrophils Relative %: 56 %
Platelets: 223 10*3/uL (ref 150–400)
RBC: 4.14 MIL/uL — ABNORMAL LOW (ref 4.22–5.81)
RDW: 13.1 % (ref 11.5–15.5)
WBC: 6.3 10*3/uL (ref 4.0–10.5)
nRBC: 0 % (ref 0.0–0.2)

## 2021-06-12 LAB — TROPONIN I (HIGH SENSITIVITY)
Troponin I (High Sensitivity): 4 ng/L (ref <18)
Troponin I (High Sensitivity): 5 ng/L (ref <18)

## 2021-06-12 LAB — URINALYSIS, MICROSCOPIC (REFLEX): Squamous Epithelial / HPF: NONE SEEN (ref 0–5)

## 2021-06-12 LAB — COMPREHENSIVE METABOLIC PANEL
ALT: 11 U/L (ref 0–44)
AST: 21 U/L (ref 15–41)
Albumin: 4.2 g/dL (ref 3.5–5.0)
Alkaline Phosphatase: 87 U/L (ref 38–126)
Anion gap: 9 (ref 5–15)
BUN: 8 mg/dL (ref 8–23)
CO2: 22 mmol/L (ref 22–32)
Calcium: 8.8 mg/dL — ABNORMAL LOW (ref 8.9–10.3)
Chloride: 101 mmol/L (ref 98–111)
Creatinine, Ser: 0.96 mg/dL (ref 0.61–1.24)
GFR, Estimated: >60 mL/min (ref >60)
Glucose, Bld: 140 mg/dL — ABNORMAL HIGH (ref 70–99)
Potassium: 3.5 mmol/L (ref 3.5–5.1)
Sodium: 132 mmol/L — ABNORMAL LOW (ref 135–145)
Total Bilirubin: 0.9 mg/dL (ref 0.3–1.2)
Total Protein: 6.7 g/dL (ref 6.5–8.1)

## 2021-06-12 LAB — URINALYSIS, ROUTINE W REFLEX MICROSCOPIC
Bilirubin Urine: NEGATIVE
Glucose, UA: NEGATIVE mg/dL
Ketones, ur: NEGATIVE mg/dL
Leukocytes,Ua: NEGATIVE
Nitrite: NEGATIVE
Protein, ur: NEGATIVE mg/dL
Specific Gravity, Urine: 1.01 (ref 1.005–1.030)
pH: 5.5 (ref 5.0–8.0)

## 2021-06-12 LAB — BRAIN NATRIURETIC PEPTIDE: B Natriuretic Peptide: 109.3 pg/mL — ABNORMAL HIGH (ref 0.0–100.0)

## 2021-06-12 LAB — MAGNESIUM: Magnesium: 2.1 mg/dL (ref 1.7–2.4)

## 2021-06-12 LAB — RESP PANEL BY RT-PCR (FLU A&B, COVID) ARPGX2
Influenza A by PCR: NEGATIVE
Influenza B by PCR: NEGATIVE
SARS Coronavirus 2 by RT PCR: NEGATIVE

## 2021-06-12 MED ORDER — AMOXICILLIN-POT CLAVULANATE 875-125 MG PO TABS
1.0000 | ORAL_TABLET | Freq: Two times a day (BID) | ORAL | Status: DC
  Administered 2021-06-12 – 2021-06-13 (×2): 1 via ORAL
  Filled 2021-06-12 (×2): qty 1

## 2021-06-12 MED ORDER — ONDANSETRON HCL 4 MG PO TABS: 4.0000 mg | ORAL_TABLET | Freq: Four times a day (QID) | ORAL | Status: DC | PRN

## 2021-06-12 MED ORDER — ACETAMINOPHEN 650 MG RE SUPP: 650.0000 mg | Freq: Four times a day (QID) | RECTAL | Status: DC | PRN

## 2021-06-12 MED ORDER — LORAZEPAM 2 MG/ML IJ SOLN: 2.0000 mg | INTRAMUSCULAR | Status: DC | PRN

## 2021-06-12 MED ORDER — ENOXAPARIN SODIUM 40 MG/0.4ML IJ SOSY
40.0000 mg | PREFILLED_SYRINGE | INTRAMUSCULAR | Status: DC
  Administered 2021-06-13: 40 mg via SUBCUTANEOUS
  Filled 2021-06-12: qty 0.4

## 2021-06-12 MED ORDER — SODIUM CHLORIDE 0.9 % IV SOLN
75.0000 mL/h | INTRAVENOUS | Status: DC
  Administered 2021-06-12 – 2021-06-13 (×2): 75 mL/h via INTRAVENOUS

## 2021-06-12 MED ORDER — ACETAMINOPHEN 325 MG PO TABS: 650.0000 mg | ORAL_TABLET | ORAL | Status: DC | PRN

## 2021-06-12 MED ORDER — ACETAMINOPHEN 325 MG PO TABS: 650.0000 mg | ORAL_TABLET | Freq: Four times a day (QID) | ORAL | Status: DC | PRN

## 2021-06-12 MED ORDER — TETANUS-DIPHTH-ACELL PERTUSSIS 5-2.5-18.5 LF-MCG/0.5 IM SUSY
0.5000 mL | PREFILLED_SYRINGE | Freq: Once | INTRAMUSCULAR | Status: AC
  Administered 2021-06-12: 0.5 mL via INTRAMUSCULAR
  Filled 2021-06-12: qty 0.5

## 2021-06-12 MED ORDER — SODIUM CHLORIDE 0.9% FLUSH
3.0000 mL | Freq: Two times a day (BID) | INTRAVENOUS | Status: DC
  Administered 2021-06-12 – 2021-06-13 (×2): 3 mL via INTRAVENOUS

## 2021-06-12 MED ORDER — ONDANSETRON HCL 4 MG/2ML IJ SOLN: 4.0000 mg | Freq: Four times a day (QID) | INTRAMUSCULAR | Status: DC | PRN

## 2021-06-12 MED ORDER — ACETAMINOPHEN 650 MG RE SUPP: 650.0000 mg | RECTAL | Status: DC | PRN

## 2021-06-12 MED ORDER — LIDOCAINE HCL (PF) 1 % IJ SOLN
INTRAMUSCULAR | Status: AC
  Administered 2021-06-12: 5 mL
  Filled 2021-06-12: qty 5

## 2021-06-12 MED ORDER — LIDOCAINE HCL (PF) 1 % IJ SOLN: 5.0000 mL | Freq: Once | INTRAMUSCULAR | Status: AC

## 2021-06-12 NOTE — ED Notes (Signed)
Medtronic pacemaker interrogated °

## 2021-06-12 NOTE — H&P (Addendum)
History and Physical    Daniel Austin [id number] DOB: 1940-11-19 DOA: 06/12/2021  PCP: Einar Pheasant, MD   Patient coming from: home  I have personally briefly reviewed patient's old medical records in [place]  Chief Complaint: syncope  HPI: Daniel Austin is a 80 y.o. male with medical history significant for CAD with history of stent angioplasty, ischemic cardiomyopathy with chronic systolic heart failure, last EF 45% in 2018, history of right carotid artery stent with recent Doppler 05/24/2021 showing no hemodynamically significant stenosis (1 to 39%) bilateral carotids, pacemaker placement secondary to second-degree heart block as well as history of HTN and hypothyroidism who was sent from urgent care following a syncopal event with seizure-like activity that occurred while having a wound washed in preparation for sutures.  Patient reportedly went ashen gray when he syncopized at the urgent care and was noted to have twitching activity.  EMS also reported seizure-like activity and patient had urinary incontinence.  By arrival he was more alert.  States he was previously in his usual state of health and was bitten by his neighbors dog on his right little finger while he was putting him.  He went to the urgent care to have the wound sutured.  He denied pain out of proportion to the wound.  Dog reportedly up-to-date with shots.  Patient denies feeling ill prior to the dog bite.  He denied lightheadedness, visual changes, one-sided numbness weakness or tingling.  He had no chest pain or palpitations or shortness of breath.  He denied recent cough, fever or chills and has had no recent nausea, vomiting, abdominal pain or diarrhea  ED course: On arrival awake, alert, BP 141/77, pulse 69, respirations 14 with O2 sat 100% on room air and afebrile Troponin 4 and BNP 109 CBC unremarkable, CMP significant only for sodium of 132 otherwise unremarkable  EKG, personally viewed and  interpreted: Sinus at 67 with LBBB which appears unchanged when compared to EKG from July 2021 on Care Everywhere  Imaging: Head CT with no acute intracranial findings but with chronic microvascular ischemic changes and an old right sided watershed infarct. Chest x-ray no acute disease Right fifth finger x-ray with no evidence of foreign body or bony injury  Pacemaker interrogated.  Laceration was repaired in the ED.  Patient started on Augmentin, given Tdap.  Hospitalist consulted for admission.   Review of Systems: As per HPI otherwise all other systems on review of systems negative.    Past Medical History:  Diagnosis Date   Anemia    Anxiety    Arthritis    CAD (coronary artery disease)    Cardiomyopathy ([place])    Carotid artery stenosis    high-grade symptomatic right sided s/p stent twice   CHF (congestive heart failure) (H[facility])    chronic movement fluid and history of CHF he noticed in the    Depression    Fractures, multiple    Metal rod in left foot   GERD (gastroesophageal reflux disease)    Heart block    Hypercholesterolemia    Hyperglycemia    Hypertension    Hypothyroidism    Knee pain    Presence of permanent cardiac pacemaker    11/16/2015 Med[employer]onic ADR01,   (2008 St Jude)   Shortness of breath dyspnea    Stroke [facility])    clot in neck broke off left side weakness 2008  resolved   Syncope    second degree heart block s/p St Jude's pacemaker placement  12/08 with his history is limited with any   Ulcer of gastric fundus     Past Surgical History:  Procedure Laterality Date   CARDIAC CATHETERIZATION     CATARACT EXTRACTION W/PHACO Right 03/07/2021   Procedure: CATARACT EXTRACTION PHACO AND INTRAOCULAR LENS PLACEMENT (IOC) RIGHT 8.69 00:55.2;  Surgeon: Birder Robson, MD;  Location: [place];  Service: Ophthalmology;  Laterality: Right;   CORONARY ANGIOPLASTY     stents x 3   heart stint  11/04/2014   [facility]   HERNIA REPAIR     inguinal    INSERT / REPLACE / REMOVE PACEMAKER     JOINT REPLACEMENT     right knee   KNEE ARTHROPLASTY Right 02/08/2016   Procedure: COMPUTER ASSISTED TOTAL KNEE ARTHROPLASTY;  Surgeon: Dereck Leep, MD;  Location: [facility];  Service: Orthopedics;  Laterality: Right;   KNEE ARTHROPLASTY Left 06/27/2016   Procedure: COMPUTER ASSISTED TOTAL KNEE ARTHROPLASTY;  Surgeon: Dereck Leep, MD;  Location: [facility];  Service: Orthopedics;  Laterality: Left;   PACEMAKER INSERTION  08/2007   PACEMAKER INSERTION N/A 11/16/2015   Procedure: INSERTION PACEMAKER/ PACEMAKER CHANGE OUT;  Surgeon: Isaias Cowman, MD;  Location: [facility];  Service: Cardiovascular;  Laterality: N/A;   PERCUTANEOUS PLACEMENT INTRAVASCULAR STENT CERVICAL CAROTID ARTERY     twice, right carotid artery     reports that he has never smoked. He has never used smokeless tobacco. He reports that he does not drink alcohol and does not use drugs.  Allergies  Allergen Reactions   Beta Adrenergic Blockers Other (See Comments)    Second Degree Heart Block   Lipitor [Atorvastatin] Other (See Comments)    Myalgia     Family History  Problem Relation Age of Onset   Heart disease Mother        CHF   Heart disease Father        CHF   Thyroid disease Father        hypothyroidism   Colon polyps Father    Diabetes Sister        diet controlled   Thyroid disease Sister    Cancer Sister        breast    Thyroid disease Sister    Cancer Sister        breast   Colon cancer Neg Hx    Prostate cancer Neg Hx       Prior to Admission medications   Medication Sig Start Date End Date Taking? Authorizing Provider  acetaminophen (TYLENOL) 500 MG tablet Take 1,000 mg by mouth every 6 (six) hours as needed.    [provider]  amLODipine (NORVASC) 2.5 MG tablet TAKE 1 TABLET BY MOUTH EVERY DAY 12/19/20   Einar Pheasant, MD  amLODipine (NORVASC) 5 MG tablet Take 1 tablet (5 mg total) by mouth daily. 10/03/20   Einar Pheasant, MD   Aspirin 81 MG EC tablet Take 81 mg by mouth daily.    [provider]  citalopram (CELEXA) 20 MG tablet TAKE 1 TABLET BY MOUTH EVERY DAY 12/19/20   Einar Pheasant, MD  clopidogrel (PLAVIX) 75 MG tablet TAKE 1 TABLET BY MOUTH EVERY DAY 01/16/21   Einar Pheasant, MD  fexofenadine (ALLEGRA) 180 MG tablet Take 180 mg by mouth daily. As needed    [provider]  levothyroxine (SYNTHROID) 125 MCG tablet TAKE 1 TABLET (125 MCG TOTAL) BY MOUTH DAILY BEFORE BREAKFAST. 01/16/21   Einar Pheasant, MD  lisinopril (ZESTRIL) 40 MG  tablet TAKE 1 TABLET BY MOUTH EVERY DAY 01/16/21   Einar Pheasant, MD  LORazepam (ATIVAN) 1 MG tablet TAKE 1 TABLET BY MOUTH EVERY DAY AT BEDTIME AS NEEDED 05/17/21   Einar Pheasant, MD  metoprolol tartrate (LOPRESSOR) 25 MG tablet TAKE 1 TABLET BY MOUTH TWICE A DAY 01/16/21   Einar Pheasant, MD  naproxen sodium (ANAPROX) 220 MG tablet Take by mouth.    [provider]  nitroGLYCERIN (NITROSTAT) 0.4 MG SL tablet Place 0.4 mg under the tongue every 5 (five) minutes as needed. If a third tablet is needed please call 911.    [provider]  pantoprazole (PROTONIX) 40 MG tablet TAKE 1 TABLET BY MOUTH EVERY DAY 12/19/20   Einar Pheasant, MD  Probiotic Product (ALIGN) 4 MG CAPS One tablet per day 01/20/19   Einar Pheasant, MD  rosuvastatin (CRESTOR) 20 MG tablet TAKE 1 TABLET BY MOUTH EVERY DAY AT NIGHT 11/15/20   Einar Pheasant, MD  triamcinolone (NASACORT) 55 MCG/ACT AERO nasal inhaler Place into the nose. 08/28/18   [provider]  triamcinolone cream (KENALOG) 0.1 % Apply 1 application topically 2 (two) times daily. 09/25/17   Einar Pheasant, MD    Physical Exam: Vitals:   06/12/21 1901 06/12/21 1902 06/12/21 1927 06/12/21 2035  BP: (!) 141/77  138/70 136/74  Pulse: 69  67 75  Resp: '14  13 18  '$ Temp: 97.6 F (36.4 C)     TempSrc: Oral     SpO2: 100%  100% 100%  Weight:  90.7 kg    Height:  '5\' 11"'$  (1.803 m)       Vitals:    06/12/21 1901 06/12/21 1902 06/12/21 1927 06/12/21 2035  BP: (!) 141/77  138/70 136/74  Pulse: 69  67 75  Resp: '14  13 18  '$ Temp: 97.6 F (36.4 C)     TempSrc: Oral     SpO2: 100%  100% 100%  Weight:  90.7 kg    Height:  '5\' 11"'$  (1.803 m)        Constitutional: Alert and oriented x 3 . Not in any apparent distress HEENT:      Head: Normocephalic and atraumatic.         Eyes: PERLA, EOMI, Conjunctivae are normal. Sclera is non-icteric.       Mouth/Throat: Mucous membranes are moist.       Neck: Supple with no signs of meningismus. Cardiovascular: Regular rate and rhythm. No murmurs, gallops, or rubs. 2+ symmetrical distal pulses are present . No JVD. No LE edema Respiratory: Respiratory effort normal .Lungs sounds clear bilaterally. No wheezes, crackles, or rhonchi.  Gastrointestinal: Soft, non tender, and non distended with positive bowel sounds.  Genitourinary: No CVA tenderness. Musculoskeletal: Nontender with normal range of motion in all extremities. No cyanosis, or erythema of extremities.  Right fourth and fifth digits and bandage Neurologic:  Face is symmetric. Moving all extremities. No gross focal neurologic deficits . Skin: Skin is warm, dry.  No rash or ulcers Psychiatric: Mood and affect are normal    Labs on Admission: I have personally reviewed following labs and imaging studies  CBC: Recent Labs  Lab 06/12/21 1908  WBC 6.3  NEUTROABS 3.5  HGB 12.4*  HCT 36.5*  MCV 88.2  PLT [id number]   Basic Metabolic Panel: Recent Labs  Lab 06/12/21 1908  NA 132*  K 3.5  CL 101  CO2 22  GLUCOSE 140*  BUN 8  CREATININE 0.96  CALCIUM 8.8*  MG 2.1  GFR: Estimated Creatinine Clearance: 69.6 mL/min (by C-G formula based on SCr of 0.96 mg/dL). Liver Function Tests: Recent Labs  Lab 06/12/21 1908  AST 21  ALT 11  ALKPHOS 87  BILITOT 0.9  PROT 6.7  ALBUMIN 4.2   No results for input(s): LIPASE, AMYLASE in the last 168 hours. No results for input(s): AMMONIA in  the last 168 hours. Coagulation Profile: No results for input(s): INR, PROTIME in the last 168 hours. Cardiac Enzymes: No results for input(s): CKTOTAL, CKMB, CKMBINDEX, TROPONINI in the last 168 hours. BNP (last 3 results) No results for input(s): PROBNP in the last 8760 hours. HbA1C: No results for input(s): HGBA1C in the last 72 hours. CBG: No results for input(s): GLUCAP in the last 168 hours. Lipid Profile: No results for input(s): CHOL, HDL, LDLCALC, TRIG, CHOLHDL, LDLDIRECT in the last 72 hours. Thyroid Function Tests: No results for input(s): TSH, T4TOTAL, FREET4, T3FREE, THYROIDAB in the last 72 hours. Anemia Panel: No results for input(s): VITAMINB12, FOLATE, FERRITIN, TIBC, IRON, RETICCTPCT in the last 72 hours. Urine analysis:    Component Value Date/Time   COLORURINE YELLOW (A) 06/11/2016 1102   APPEARANCEUR CLEAR (A) 06/11/2016 1102   LABSPEC 1.015 06/11/2016 1102   PHURINE 6.0 06/11/2016 1102   GLUCOSEU NEGATIVE 06/11/2016 1102   HGBUR NEGATIVE 06/11/2016 1102   BILIRUBINUR NEGATIVE 06/11/2016 1102   KETONESUR NEGATIVE 06/11/2016 1102   PROTEINUR NEGATIVE 06/11/2016 1102   NITRITE NEGATIVE 06/11/2016 1102   LEUKOCYTESUR NEGATIVE 06/11/2016 1102    Radiological Exams on Admission: CT HEAD WO CONTRAST (5MM)  Result Date: 06/12/2021 CLINICAL DATA:  80 year old male with history of nontraumatic seizure. EXAM: CT HEAD WITHOUT CONTRAST TECHNIQUE: Contiguous axial images were obtained from the base of the skull through the vertex without intravenous contrast. COMPARISON:  No priors. FINDINGS: Brain: Mild cerebral atrophy. Patchy and confluent areas of decreased attenuation are noted throughout the deep and periventricular white matter of the cerebral hemispheres bilaterally, compatible with chronic microvascular ischemic disease. In addition, there are more well-defined areas of low attenuation in the right frontal lobe and right parietooccipital region, compatible with  areas of encephalomalacia from prior right sided watershed infarcts. No evidence of acute infarction, hemorrhage, hydrocephalus, extra-axial collection or mass lesion/mass effect. Vascular: Numerous atherosclerotic calcifications in the vertebral arteries bilaterally. Skull: Normal. Negative for fracture or focal lesion. Sinuses/Orbits: No acute finding. Other: None. IMPRESSION: 1. No acute intracranial abnormalities. 2. Mild cerebral atrophy with extensive chronic microvascular ischemic changes in the cerebral white matter and old right-sided watershed infarcts, as above. Electronically Signed   By: Vinnie Langton M.D.   On: 06/12/2021 19:45   DG Chest Portable 1 View  Result Date: 06/12/2021 CLINICAL DATA:  Syncope.  Possible seizure. EXAM: PORTABLE CHEST 1 VIEW COMPARISON:  Chest x-ray dated November 09, 2015. FINDINGS: Unchanged left chest wall pacemaker. The heart size and mediastinal contours are within normal limits. Normal pulmonary vascularity. No focal consolidation, pleural effusion, or pneumothorax. No acute osseous abnormality. IMPRESSION: No active disease. Electronically Signed   By: Titus Dubin M.D.   On: 06/12/2021 19:42   DG Finger Little Right  Result Date: 06/12/2021 CLINICAL DATA:  Puncture wound to the right fifth finger from a dog bite. EXAM: RIGHT LITTLE FINGER 2+V COMPARISON:  None. FINDINGS: Diffuse bone demineralization. Degenerative changes in the interphalangeal and metacarpal phalangeal joints. No evidence of acute fracture or dislocation. No focal bone lesions. Soft tissue defect along the dorsum consistent with history of dog bite. Mild soft  tissue swelling. No radiopaque soft tissue foreign bodies. IMPRESSION: No acute bony abnormalities. Degenerative changes and demineralization. No radiopaque soft tissue foreign bodies. Electronically Signed   By: Lucienne Capers M.D.   On: 06/12/2021 19:42     Assessment/Plan 80 year old male with history of CAD with history of  stent angioplasty, ischemic cardiomyopathy with chronic systolic heart failure, last EF 45% in 2018, history of right carotid artery stent with recent Doppler 05/24/2021 showing no hemodynamically significant stenosis (1 to 39%) bilateral carotids, pacemaker placement secondary to second-degree heart block as well as history of HTN and hypothyroidism who was sent from urgent care following a syncopal event with seizure-like activity that occurred while having a wound washed in preparation for sutures. bony injury    Syncope and collapse   Seizure-like activity -Circumstances of patient's syncopal event with suggest vasovagal syncope however given extensive cardiac history, as well as seizure-like activity, will do work-up to evaluate for acute cardiac and neurologic etiologies - CT head nonacute but showed extensive microvascular disease and evidence of old right sided watershed infarcts -pacemaker check showed no irregularities - Continuous cardiac monitoring - Echocardiogram - Continue to trend troponins to evaluate for ACS  - EEG given seizure-like activity - Patient had recent carotid artery Doppler in August 2022 which showed patent carotids so will not repeat - IV hydration - Neurologic checks with fall, aspiration and seizure precautions -Neurology consult   Dog bite with laceration right fifth finger - Laceration repair done in the ED on 9/19 - Tdap was updated - Wound care    Carotid artery stenosis   History of right common carotid artery stent placement - Carotid Doppler on 05/23/2021 showed 1 to 39% stenosis of bilateral carotids     Chronic systolic CHF (congestive heart failure) ([place])   Cardiomyopathy, ischemic - Last echo with EF 45% in March 2018 - Continue lisinoprilandmetoprolol    Pacemaker - Pacemaker check was done that showed no arrhythmias    Hypothyroidism - Continue levothyroxine    Coronary atherosclerosis of native coronary artery - Patient with  triple-vessel coronary artery disease with history of stent angioplasty - No complaints of chest pain and troponin negative - EKG showing LBBB but appears similar to EKG July 2021 - Continue clopidogrel and aspirin, metoprolol, Crestor, nitroglycerin as needed    Essential hypertension, benign - Continue amlodipine, lisinopril and metoprolol      DVT prophylaxis: Lovenox  Code Status: full code  Family Communication:  wife at bedside Disposition Plan: Back to previous home environment Consults called: none  Status:observation    Athena Masse MD [facility]     06/12/2021, 9:49 PM

## 2021-06-12 NOTE — ED Triage Notes (Signed)
Pt presents today with c/o of puncture wound to right pinky finger. He reports being bit by neighbor dog this morning. Dog has current Rabies vaccine. Last Tetanus unknown.

## 2021-06-12 NOTE — ED Provider Notes (Signed)
[facility] Emergency Department Provider Note  ____________________________________________   Event Date/Time   First MD Initiated Contact with Patient 06/12/21 1904     (approximate)  I have reviewed the triage vital signs and the nursing notes.   HISTORY  Chief Complaint Loss of Consciousness    HPI Daniel Austin is a 80 y.o. male with dilated cardiomyopathy status post pacemaker due to second-degree heart block who comes in with concerns for seizure versus syncopal episode.  Patient was reportedly at urgent care getting a right finger wound washed out after being bit by a dog when he went unconscious and had some seizure-like activity and urinated on himself.  When EMS got there he was gray had agonal breathing and EKG did show some ST elevations with his left bundle branch block.  However upon repeat EKG that was transmitted these ST elevations had resolved.  He denies any chest pain or shortness of breath.  He denies any abdominal pain.  He states that he feels at his normal self at this time.  He states that he remembers going out and it just went out all of a sudden.  Denies any prodromal symptoms.  Denies his finger hurting really bad while washing it out.  He does report a history of vasovagal syncope in the past worked up at [facility].  Denies any history of seizure.  This episode happened 1 time, severe, unclear what brought it on, better with time.          Past Medical History:  Diagnosis Date   Anemia    Anxiety    Arthritis    CAD (coronary artery disease)    Cardiomyopathy ([place])    Carotid artery stenosis    high-grade symptomatic right sided s/p stent twice   CHF (congestive heart failure) ([place])    chronic movement fluid and history of CHF he noticed in the    Depression    Fractures, multiple    Metal rod in left foot   GERD (gastroesophageal reflux disease)    Heart block    Hypercholesterolemia    Hyperglycemia    Hypertension     Hypothyroidism    Knee pain    Presence of permanent cardiac pacemaker    11/16/2015 Medtronic ADR01,   (2008 St Jude)   Shortness of breath dyspnea    Stroke ([place])    clot in neck broke off left side weakness 2008  resolved   Syncope    second degree heart block s/p St Jude's pacemaker placement 12/08 with his history is limited with any   Ulcer of gastric fundus     Patient Active Problem List   Diagnosis Date Noted   Weakness 04/30/2020   Hyponatremia 05/10/2019   Dog bite 01/21/2019   Stress 05/05/2017   Weakness of left arm 11/09/2016   H/O total knee replacement 03/24/2016   S/P total knee arthroplasty 02/08/2016   HLD (hyperlipidemia) 12/13/2015   BP (high blood pressure) 12/13/2015   Cardiomyopathy, ischemic 12/13/2015   MI (mitral incompetence) 12/13/2015   Billowing mitral valve 12/13/2015   History of ventricular tachycardia 12/13/2015   Carotid stenosis 12/13/2015   Episode of syncope 12/13/2015   Disease of thyroid gland 12/13/2015   Health care maintenance 09/04/2015   Encounter for completion of form with patient 01/12/2015   Left arm numbness 09/30/2014   Ischemia of upper extremity 09/30/2014   Pins and needles sensation 09/30/2014   Unspecified visual disturbance 09/30/2014  Paresthesia of arm 09/30/2014   Primary osteoarthritis of both knees 09/29/2014   History of cardiac catheterization 05/12/2014   Other specified postprocedural states 05/12/2014   Change in vision 01/09/2014   Knee pain, bilateral 01/09/2014   Arthralgia of lower leg 01/09/2014   Left knee pain 09/13/2013   Hyperglycemia 11/05/2012   Carotid artery stenosis 11/05/2012   Carotid artery narrowing 11/05/2012   Carotid artery obstruction 11/05/2012   Abnormal blood sugar 11/05/2012   Hypothyroidism 10/31/2012   Pure hypercholesterolemia 10/31/2012   Dysphagia, unspecified(787.20) 10/31/2012   Coronary atherosclerosis of native coronary artery 10/31/2012   Anemia 10/31/2012    Essential hypertension, benign 10/31/2012   Absolute anemia 10/31/2012   Benign essential HTN 10/31/2012   Adult hypothyroidism 10/31/2012    Past Surgical History:  Procedure Laterality Date   CARDIAC CATHETERIZATION     CATARACT EXTRACTION W/PHACO Right 03/07/2021   Procedure: CATARACT EXTRACTION PHACO AND INTRAOCULAR LENS PLACEMENT ([place]) RIGHT 8.69 00:55.2;  Surgeon: Birder Robson, MD;  Location: [facility];  Service: Ophthalmology;  Laterality: Right;   CORONARY ANGIOPLASTY     stents x 3   heart stint  11/04/2014   [facility]   HERNIA REPAIR     inguinal   INSERT / REPLACE / REMOVE PACEMAKER     JOINT REPLACEMENT     right knee   KNEE ARTHROPLASTY Right 02/08/2016   Procedure: COMPUTER ASSISTED TOTAL KNEE ARTHROPLASTY;  Surgeon: Dereck Leep, MD;  Location: [facility];  Service: Orthopedics;  Laterality: Right;   KNEE ARTHROPLASTY Left 06/27/2016   Procedure: COMPUTER ASSISTED TOTAL KNEE ARTHROPLASTY;  Surgeon: Dereck Leep, MD;  Location: [facility];  Service: Orthopedics;  Laterality: Left;   PACEMAKER INSERTION  08/2007   PACEMAKER INSERTION N/A 11/16/2015   Procedure: INSERTION PACEMAKER/ PACEMAKER CHANGE OUT;  Surgeon: Isaias Cowman, MD;  Location: [facility];  Service: Cardiovascular;  Laterality: N/A;   PERCUTANEOUS PLACEMENT INTRAVASCULAR STENT CERVICAL CAROTID ARTERY     twice, right carotid artery    Prior to Admission medications   Medication Sig Start Date End Date Taking? Authorizing Provider  acetaminophen (TYLENOL) 500 MG tablet Take 1,000 mg by mouth every 6 (six) hours as needed.    [provider]  amLODipine (NORVASC) 2.5 MG tablet TAKE 1 TABLET BY MOUTH EVERY DAY 12/19/20   Einar Pheasant, MD  amLODipine (NORVASC) 5 MG tablet Take 1 tablet (5 mg total) by mouth daily. 10/03/20   Einar Pheasant, MD  Aspirin 81 MG EC tablet Take 81 mg by mouth daily.    [provider]  citalopram (CELEXA) 20 MG tablet TAKE 1 TABLET BY  MOUTH EVERY DAY 12/19/20   Einar Pheasant, MD  clopidogrel (PLAVIX) 75 MG tablet TAKE 1 TABLET BY MOUTH EVERY DAY 01/16/21   Einar Pheasant, MD  fexofenadine (ALLEGRA) 180 MG tablet Take 180 mg by mouth daily. As needed    [provider]  levothyroxine (SYNTHROID) 125 MCG tablet TAKE 1 TABLET (125 MCG TOTAL) BY MOUTH DAILY BEFORE BREAKFAST. 01/16/21   Einar Pheasant, MD  lisinopril (ZESTRIL) 40 MG tablet TAKE 1 TABLET BY MOUTH EVERY DAY 01/16/21   Einar Pheasant, MD  LORazepam (ATIVAN) 1 MG tablet TAKE 1 TABLET BY MOUTH EVERY DAY AT BEDTIME AS NEEDED 05/17/21   Einar Pheasant, MD  metoprolol tartrate (LOPRESSOR) 25 MG tablet TAKE 1 TABLET BY MOUTH TWICE A DAY 01/16/21   Einar Pheasant, MD  naproxen sodium (ANAPROX) 220 MG tablet Take by mouth.  [provider]  nitroGLYCERIN (NITROSTAT) 0.4 MG SL tablet Place 0.4 mg under the tongue every 5 (five) minutes as needed. If a third tablet is needed please call 911.    [provider]  pantoprazole (PROTONIX) 40 MG tablet TAKE 1 TABLET BY MOUTH EVERY DAY 12/19/20   Einar Pheasant, MD  Probiotic Product (ALIGN) 4 MG CAPS One tablet per day 01/20/19   Einar Pheasant, MD  rosuvastatin (CRESTOR) 20 MG tablet TAKE 1 TABLET BY MOUTH EVERY DAY AT NIGHT 11/15/20   Einar Pheasant, MD  triamcinolone (NASACORT) 55 MCG/ACT AERO nasal inhaler Place into the nose. 08/28/18   [provider]  triamcinolone cream (KENALOG) 0.1 % Apply 1 application topically 2 (two) times daily. 09/25/17   Einar Pheasant, MD    Allergies Beta adrenergic blockers and Lipitor [atorvastatin]  Family History  Problem Relation Age of Onset   Heart disease Mother        CHF   Heart disease Father        CHF   Thyroid disease Father        hypothyroidism   Colon polyps Father    Diabetes Sister        diet controlled   Thyroid disease Sister    Cancer Sister        breast    Thyroid disease Sister    Cancer Sister        breast   Colon  cancer Neg Hx    Prostate cancer Neg Hx     Social History Social History   Tobacco Use   Smoking status: Never   Smokeless tobacco: Never  Vaping Use   Vaping Use: Never used  Substance Use Topics   Alcohol use: No    Alcohol/week: 0.0 standard drinks   Drug use: No      Review of Systems Constitutional: No fever/chills, loss of consciousness Eyes: No visual changes. ENT: No sore throat. Cardiovascular: Denies chest pain. Respiratory: Denies shortness of breath. Gastrointestinal: No abdominal pain.  No nausea, no vomiting.  No diarrhea.  No constipation. Genitourinary: Negative for dysuria. Musculoskeletal: Negative for back pain.  Finger injury Skin: Negative for rash. Neurological: Negative for headaches, focal weakness or numbness. All other ROS negative ____________________________________________   PHYSICAL EXAM:  VITAL SIGNS: ED Triage Vitals  Enc Vitals Group     BP 06/12/21 1901 (!) 141/77     Pulse Rate 06/12/21 1901 69     Resp 06/12/21 1901 14     Temp 06/12/21 1901 97.6 F (36.4 C)     Temp Source 06/12/21 1901 Oral     SpO2 06/12/21 1901 100 %     Weight 06/12/21 1902 199 lb 15.3 oz (90.7 kg)     Height 06/12/21 1902 '5\' 11"'$  (1.803 m)     Head Circumference --      Peak Flow --      Pain Score 06/12/21 1902 0     Pain Loc --      Pain Edu? --      Excl. in [place]? --     Constitutional: Alert and oriented. Well appearing and in no acute distress. Eyes: Conjunctivae are normal. EOMI. Head: Atraumatic. Nose: No congestion/rhinnorhea. Mouth/Throat: Mucous membranes are moist.   Neck: No stridor. Trachea Midline. FROM Cardiovascular: Normal rate, regular rhythm. Grossly normal heart sounds.  Good peripheral circulation. Respiratory: Normal respiratory effort.  No retractions. Lungs CTAB. Gastrointestinal: Soft and nontender. No distention. No abdominal bruits.  Musculoskeletal:  No lower extremity tenderness nor edema.  No joint effusions.  Small  laceration 2cm superficial.  No tendon exposure noted to the right pinky finger able to flex at the DIP and PIP Neurologic:  Normal speech and language. No gross focal neurologic deficits are appreciated.  Skin:  Skin is warm, dry and intact. No rash noted. Psychiatric: Mood and affect are normal. Speech and behavior are normal. GU: Deferred   ____________________________________________   LABS (all labs ordered are listed, but only abnormal results are displayed)  Labs Reviewed  RESP PANEL BY RT-PCR (FLU A&B, COVID) ARPGX2  CBC WITH DIFFERENTIAL/PLATELET  COMPREHENSIVE METABOLIC PANEL  MAGNESIUM  URINALYSIS, ROUTINE W REFLEX MICROSCOPIC  TROPONIN I (HIGH SENSITIVITY)   ____________________________________________   ED ECG REPORT I, Vanessa Oakhurst, the attending physician, personally viewed and interpreted this ECG.  Normal sinus rate of 67 without any ST elevation with a left bundle branch block and T wave version in aVL and short PR interval ____________________________________________  RADIOLOGY Robert Bellow, personally viewed and evaluated these images (plain radiographs) as part of my medical decision making, as well as reviewing the written report by the radiologist.  ED MD interpretation: No fracture on x-ray  Official radiology report(s): CT HEAD WO CONTRAST (5MM)  Result Date: 06/12/2021 CLINICAL DATA:  80 year old male with history of nontraumatic seizure. EXAM: CT HEAD WITHOUT CONTRAST TECHNIQUE: Contiguous axial images were obtained from the base of the skull through the vertex without intravenous contrast. COMPARISON:  No priors. FINDINGS: Brain: Mild cerebral atrophy. Patchy and confluent areas of decreased attenuation are noted throughout the deep and periventricular white matter of the cerebral hemispheres bilaterally, compatible with chronic microvascular ischemic disease. In addition, there are more well-defined areas of low attenuation in the right frontal lobe  and right parietooccipital region, compatible with areas of encephalomalacia from prior right sided watershed infarcts. No evidence of acute infarction, hemorrhage, hydrocephalus, extra-axial collection or mass lesion/mass effect. Vascular: Numerous atherosclerotic calcifications in the vertebral arteries bilaterally. Skull: Normal. Negative for fracture or focal lesion. Sinuses/Orbits: No acute finding. Other: None. IMPRESSION: 1. No acute intracranial abnormalities. 2. Mild cerebral atrophy with extensive chronic microvascular ischemic changes in the cerebral white matter and old right-sided watershed infarcts, as above. Electronically Signed   By: Vinnie Langton M.D.   On: 06/12/2021 19:45   DG Chest Portable 1 View  Result Date: 06/12/2021 CLINICAL DATA:  Syncope.  Possible seizure. EXAM: PORTABLE CHEST 1 VIEW COMPARISON:  Chest x-ray dated November 09, 2015. FINDINGS: Unchanged left chest wall pacemaker. The heart size and mediastinal contours are within normal limits. Normal pulmonary vascularity. No focal consolidation, pleural effusion, or pneumothorax. No acute osseous abnormality. IMPRESSION: No active disease. Electronically Signed   By: Titus Dubin M.D.   On: 06/12/2021 19:42   DG Finger Little Right  Result Date: 06/12/2021 CLINICAL DATA:  Puncture wound to the right fifth finger from a dog bite. EXAM: RIGHT LITTLE FINGER 2+V COMPARISON:  None. FINDINGS: Diffuse bone demineralization. Degenerative changes in the interphalangeal and metacarpal phalangeal joints. No evidence of acute fracture or dislocation. No focal bone lesions. Soft tissue defect along the dorsum consistent with history of dog bite. Mild soft tissue swelling. No radiopaque soft tissue foreign bodies. IMPRESSION: No acute bony abnormalities. Degenerative changes and demineralization. No radiopaque soft tissue foreign bodies. Electronically Signed   By: Lucienne Capers M.D.   On: 06/12/2021 19:42     ____________________________________________   PROCEDURES  Procedure(s) performed (including Critical Care):  Marland KitchenMarland Kitchen  Laceration Repair  Date/Time: 06/12/2021 8:18 PM Performed by: Vanessa Johnson Siding, MD Authorized by: Vanessa Lake Holiday, MD   Consent:    Consent obtained:  Verbal   Consent given by:  Patient   Risks discussed:  Infection, need for additional repair, nerve damage, poor wound healing, poor cosmetic result, pain, retained foreign body, tendon damage and vascular damage   Alternatives discussed:  No treatment Universal protocol:    Patient identity confirmed:  Verbally with patient Anesthesia:    Anesthesia method:  Local infiltration   Local anesthetic:  Lidocaine 1% w/o epi Laceration details:    Location:  Finger   Finger location:  R small finger   Length (cm):  2   Depth (mm):  1 Exploration:    Wound extent: no tendon damage noted and no underlying fracture noted     Contaminated: no   Treatment:    Area cleansed with:  Povidone-iodine   Amount of cleaning:  Standard   Debridement:  None Skin repair:    Repair method:  Sutures   Suture size:  6-0   Suture material:  Prolene   Suture technique:  Simple interrupted   Number of sutures:  1 Approximation:    Approximation:  Close Repair type:    Repair type:  Simple Post-procedure details:    Dressing:  Antibiotic ointment   Procedure completion:  Tolerated well, no immediate complications .1-3 Lead EKG Interpretation Performed by: Vanessa Walkerton, MD Authorized by: Vanessa [place], MD     Interpretation: normal     ECG rate:  70s   ECG rate assessment: normal     Rhythm: sinus rhythm     Ectopy: none     Conduction: normal     ____________________________________________   INITIAL IMPRESSION / ASSESSMENT AND PLAN / ED COURSE  Daniel Austin was evaluated in Emergency Department on 06/12/2021 for the symptoms described in the history of present illness. He was evaluated in the context of the global  COVID-19 pandemic, which necessitated consideration that the patient might be at risk for infection with the SARS-CoV-2 virus that causes COVID-19. Institutional protocols and algorithms that pertain to the evaluation of patients at risk for COVID-19 are in a state of rapid change based on information released by regulatory bodies including the CDC and federal and state organizations. These policies and algorithms were followed during the patient's care in the ED.    Patient comes in with concern for syncopal episode seizure activity.  I did read the note from urgent care and they were very concerned that patient could have had a seizure.  This could also be vasovagal in nature.  Cardiac markers were ordered to evaluate for ACS and patient will be kept on the cardiac monitor.  CT head ordered to evaluate for any intracranial hemorrhage.  Labs ordered evaluate for any electrolyte abnormalities, AKI.  Patient has a laceration to his pinky finger and x-ray was ordered to make sure no underlying fracture due to the dog bite.  This was negative.  I did place 1 suture and I did discuss with him the risk for infection and that symptoms would not even close this at all but given it is slightly gaping open we will do 1 suture to kind of tachycardia in place and to help with wound healing.  Patient preferred to do this and stated that they were can do at the urgent care.   Will discuss hospital team for admission for neurology consult,  EEG, cardiac monitoring, echocardiogram and further work-up of this seizure versus syncopal episode.  We will hold off on MRI due to patient having a pacemaker.  We will discussed with the hospital team for admission.  Patient started on 7 days of Augmentin for the dog bite.  Patient instructed that the suture needs to be removed in 7 to 10 days           ____________________________________________   FINAL CLINICAL IMPRESSION(S) / ED DIAGNOSES   Final diagnoses:  Dog  bite, initial encounter  Loss of consciousness ([place])      MEDICATIONS GIVEN DURING THIS VISIT:  Medications  amoxicillin-clavulanate (AUGMENTIN) 875-125 MG per tablet 1 tablet (has no administration in time range)  Tdap (BOOSTRIX) injection 0.5 mL (0.5 mLs Intramuscular Given 06/12/21 2026)  lidocaine (PF) (XYLOCAINE) 1 % injection 5 mL (5 mLs Infiltration Given by Other 06/12/21 2005)     ED Discharge Orders     None        Note:  This document was prepared using Dragon voice recognition software and may include unintentional dictation errors.    Vanessa Upton, MD 06/12/21 2124

## 2021-06-12 NOTE — ED Notes (Signed)
[employer] rep provided report to Dr. Jari Pigg.

## 2021-06-12 NOTE — ED Notes (Signed)
Pt to CT

## 2021-06-12 NOTE — ED Notes (Signed)
Patient is being discharged from the Urgent Care and sent to the Emergency Department via EMS . Per J.Cook MD, patient is in need of higher level of care due to seizure-like activity. Patient is aware and verbalizes understanding of plan of care.  Vitals:   06/12/21 1756 06/12/21 1759  BP:  (!) 131/109  Pulse:  77  Resp: 16 16  Temp: 98.3 F (36.8 C)   SpO2:  99%

## 2021-06-12 NOTE — ED Triage Notes (Signed)
Pt arrives via [facility] with CC of syncopal episode that occurred at an urgent care when wound was being washed out in anticipation of suturing wound. EMS reports pt had seizure like activity for one minute, urinated on self, was altered after episode, and appeared to be "gray" in color.

## 2021-06-12 NOTE — ED Notes (Signed)
Pts laceration cleaned with normal saline and gauze.

## 2021-06-12 NOTE — Telephone Encounter (Signed)
Patient sent to access nurse received a dog bite . Last TDAP 2008

## 2021-06-12 NOTE — ED Provider Notes (Addendum)
[facility]    CSN: [id number] Arrival date & time: 06/12/21  1718      History   Chief Complaint Chief Complaint  Patient presents with   Animal Bite   Puncture Wound    Right 5th finger    HPI 80 year old male presents with the above complaints.  Patient states that he was bitten by his neighbors dog this morning.  States that he was petting him while he was eating and subsequently got bit.  He has a laceration to the dorsum of the right fifth digit.  Bleeding is well controlled.  Dog is up-to-date on vaccines per his report.  His last tetanus is unknown.  Of note, patient became unresponsive while I was cleaning his wound.  See assessment and plan.  Past Medical History:  Diagnosis Date   Anemia    Anxiety    Arthritis    CAD (coronary artery disease)    Cardiomyopathy ([place])    Carotid artery stenosis    high-grade symptomatic right sided s/p stent twice   CHF (congestive heart failure) ([place])    chronic movement fluid and history of CHF he noticed in the    Depression    Fractures, multiple    Metal rod in left foot   GERD (gastroesophageal reflux disease)    Heart block    Hypercholesterolemia    Hyperglycemia    Hypertension    Hypothyroidism    Knee pain    Presence of permanent cardiac pacemaker    11/16/2015 Medtronic ADR01,   (2008 St Jude)   Shortness of breath dyspnea    Stroke ([place])    clot in neck broke off left side weakness 2008  resolved   Syncope    second degree heart block s/p St Jude's pacemaker placement 12/08 with his history is limited with any   Ulcer of gastric fundus     Patient Active Problem List   Diagnosis Date Noted   Weakness 04/30/2020   Hyponatremia 05/10/2019   Dog bite 01/21/2019   Stress 05/05/2017   Weakness of left arm 11/09/2016   H/O total knee replacement 03/24/2016   S/P total knee arthroplasty 02/08/2016   HLD (hyperlipidemia) 12/13/2015   BP (high blood pressure) 12/13/2015   Cardiomyopathy,  ischemic 12/13/2015   MI (mitral incompetence) 12/13/2015   Billowing mitral valve 12/13/2015   History of ventricular tachycardia 12/13/2015   Carotid stenosis 12/13/2015   Episode of syncope 12/13/2015   Disease of thyroid gland 12/13/2015   Health care maintenance 09/04/2015   Encounter for completion of form with patient 01/12/2015   Left arm numbness 09/30/2014   Ischemia of upper extremity 09/30/2014   Pins and needles sensation 09/30/2014   Unspecified visual disturbance 09/30/2014   Paresthesia of arm 09/30/2014   Primary osteoarthritis of both knees 09/29/2014   History of cardiac catheterization 05/12/2014   Other specified postprocedural states 05/12/2014   Change in vision 01/09/2014   Knee pain, bilateral 01/09/2014   Arthralgia of lower leg 01/09/2014   Left knee pain 09/13/2013   Hyperglycemia 11/05/2012   Carotid artery stenosis 11/05/2012   Carotid artery narrowing 11/05/2012   Carotid artery obstruction 11/05/2012   Abnormal blood sugar 11/05/2012   Hypothyroidism 10/31/2012   Pure hypercholesterolemia 10/31/2012   Dysphagia, unspecified(787.20) 10/31/2012   Coronary atherosclerosis of native coronary artery 10/31/2012   Anemia 10/31/2012   Essential hypertension, benign 10/31/2012   Absolute anemia 10/31/2012   Benign essential HTN 10/31/2012   Adult hypothyroidism  10/31/2012    Past Surgical History:  Procedure Laterality Date   CARDIAC CATHETERIZATION     CATARACT EXTRACTION W/PHACO Right 03/07/2021   Procedure: CATARACT EXTRACTION PHACO AND INTRAOCULAR LENS PLACEMENT (IOC) RIGHT 8.69 00:55.2;  Surgeon: Birder Robson, MD;  Location: [place];  Service: Ophthalmology;  Laterality: Right;   CORONARY ANGIOPLASTY     stents x 3   heart stint  11/04/2014   [facility]   HERNIA REPAIR     inguinal   INSERT / REPLACE / REMOVE PACEMAKER     JOINT REPLACEMENT     right knee   KNEE ARTHROPLASTY Right 02/08/2016   Procedure: COMPUTER  ASSISTED TOTAL KNEE ARTHROPLASTY;  Surgeon: Dereck Leep, MD;  Location: [facility];  Service: Orthopedics;  Laterality: Right;   KNEE ARTHROPLASTY Left 06/27/2016   Procedure: COMPUTER ASSISTED TOTAL KNEE ARTHROPLASTY;  Surgeon: Dereck Leep, MD;  Location: [facility];  Service: Orthopedics;  Laterality: Left;   PACEMAKER INSERTION  08/2007   PACEMAKER INSERTION N/A 11/16/2015   Procedure: INSERTION PACEMAKER/ PACEMAKER CHANGE OUT;  Surgeon: Isaias Cowman, MD;  Location: [facility];  Service: Cardiovascular;  Laterality: N/A;   PERCUTANEOUS PLACEMENT INTRAVASCULAR STENT CERVICAL CAROTID ARTERY     twice, right carotid artery       Home Medications    Prior to Admission medications   Medication Sig Start Date End Date Taking? Authorizing Provider  acetaminophen (TYLENOL) 500 MG tablet Take 1,000 mg by mouth every 6 (six) hours as needed.    [provider]  amLODipine (NORVASC) 2.5 MG tablet TAKE 1 TABLET BY MOUTH EVERY DAY 12/19/20   Einar Pheasant, MD  amLODipine (NORVASC) 5 MG tablet Take 1 tablet (5 mg total) by mouth daily. 10/03/20   Einar Pheasant, MD  Aspirin 81 MG EC tablet Take 81 mg by mouth daily.    [provider]  citalopram (CELEXA) 20 MG tablet TAKE 1 TABLET BY MOUTH EVERY DAY 12/19/20   Einar Pheasant, MD  clopidogrel (PLAVIX) 75 MG tablet TAKE 1 TABLET BY MOUTH EVERY DAY 01/16/21   Einar Pheasant, MD  fexofenadine (ALLEGRA) 180 MG tablet Take 180 mg by mouth daily. As needed    [provider]  levothyroxine (SYNTHROID) 125 MCG tablet TAKE 1 TABLET (125 MCG TOTAL) BY MOUTH DAILY BEFORE BREAKFAST. 01/16/21   Einar Pheasant, MD  lisinopril (ZESTRIL) 40 MG tablet TAKE 1 TABLET BY MOUTH EVERY DAY 01/16/21   Einar Pheasant, MD  LORazepam (ATIVAN) 1 MG tablet TAKE 1 TABLET BY MOUTH EVERY DAY AT BEDTIME AS NEEDED 05/17/21   Einar Pheasant, MD  metoprolol tartrate (LOPRESSOR) 25 MG tablet TAKE 1 TABLET BY MOUTH TWICE A DAY 01/16/21   Einar Pheasant,  MD  naproxen sodium (ANAPROX) 220 MG tablet Take by mouth.    [provider]  nitroGLYCERIN (NITROSTAT) 0.4 MG SL tablet Place 0.4 mg under the tongue every 5 (five) minutes as needed. If a third tablet is needed please call 911.    [provider]  pantoprazole (PROTONIX) 40 MG tablet TAKE 1 TABLET BY MOUTH EVERY DAY 12/19/20   Einar Pheasant, MD  Probiotic Product (ALIGN) 4 MG CAPS One tablet per day 01/20/19   Einar Pheasant, MD  rosuvastatin (CRESTOR) 20 MG tablet TAKE 1 TABLET BY MOUTH EVERY DAY AT NIGHT 11/15/20   Einar Pheasant, MD  triamcinolone (NASACORT) 55 MCG/ACT AERO nasal inhaler Place into the nose. 08/28/18   [provider]  triamcinolone cream (KENALOG) 0.1 %  Apply 1 application topically 2 (two) times daily. 09/25/17   Einar Pheasant, MD    Family History Family History  Problem Relation Age of Onset   Heart disease Mother        CHF   Heart disease Father        CHF   Thyroid disease Father        hypothyroidism   Colon polyps Father    Diabetes Sister        diet controlled   Thyroid disease Sister    Cancer Sister        breast    Thyroid disease Sister    Cancer Sister        breast   Colon cancer Neg Hx    Prostate cancer Neg Hx     Social History Social History   Tobacco Use   Smoking status: Never   Smokeless tobacco: Never  Vaping Use   Vaping Use: Never used  Substance Use Topics   Alcohol use: No    Alcohol/week: 0.0 standard drinks   Drug use: No     Allergies   Beta adrenergic blockers and Lipitor [atorvastatin]   Review of Systems Review of Systems  Constitutional:  Negative for fever.  Skin:  Positive for wound.    Physical Exam Triage Vital Signs ED Triage Vitals  Enc Vitals Group     BP 06/12/21 1759 (!) 131/109     Pulse Rate 06/12/21 1759 77     Resp 06/12/21 1756 16     Temp 06/12/21 1756 98.3 F (36.8 C)     Temp Source 06/12/21 1756 Oral     SpO2 06/12/21 1759 99 %     Weight --       Height --      Head Circumference --      Peak Flow --      Pain Score 06/12/21 1755 0     Pain Loc --      Pain Edu? --      Excl. in [place]? --    Updated Vital Signs BP (!) 131/109 (BP Location: Left Arm)   Pulse 77   Temp 98.3 F (36.8 C) (Oral)   Resp 16   SpO2 99%   Visual Acuity Right Eye Distance:   Left Eye Distance:   Bilateral Distance:    Right Eye Near:   Left Eye Near:    Bilateral Near:     Physical Exam Vitals and nursing note reviewed.  Constitutional:      General: He is not in acute distress.    Comments: Patient was initially alert and oriented.  Eyes:     General:        Right eye: No discharge.  Cardiovascular:     Rate and Rhythm: Normal rate and regular rhythm.  Pulmonary:     Effort: Pulmonary effort is normal.     Breath sounds: No wheezing or rales.  Skin:    Comments: Small linear laceration noted to the dorsum of the right fifth digit.  Neurological:     Comments: Patient initially alert and oriented.  Subsequently became unresponsive and had seizure-like activity and became incontinent.     UC Treatments / Results  Labs (all labs ordered are listed, but only abnormal results are displayed) Labs Reviewed - No data to display  EKG   Radiology No results found.  Procedures Procedures (including critical care time)  Medications Ordered in UC Medications - No data  to display  Initial Impression / Assessment and Plan / UC Course  I have reviewed the triage vital signs and the nursing notes.  Pertinent labs & imaging results that were available during my care of the patient were reviewed by me and considered in my medical decision making (see chart for details).    80 year old male presents with a dog bite to the right fifth digit. I was cleaning his wound and patiently subsequently withdrew his hand.  I looked over the patient and he was pale and subsequently became unresponsive.  Patient had focal seizure activity, snoring  respirations and became incontinent of urine.  Patient's family was brought to the room.  Family states that he has had episodes like this previously and he was diagnosed with syncope.  This appears to consistent with a focal seizure.  Patient is being transported to the hospital for further evaluation and management.  Patient needs higher level of care.  He needs close monitoring.  Recommend neurology assessment.   Final Clinical Impressions(s) / UC Diagnoses   Final diagnoses:  Dog bite, initial encounter  Seizure [facility])   Discharge Instructions   None    ED Prescriptions   None    PDMP not reviewed this encounter.     Coral Spikes, [place] 06/12/21 1843

## 2021-06-12 NOTE — Telephone Encounter (Signed)
Access nurse instructed patient on care advise for patient. Due to no availability in office. Also instructed patient to be evaluated if sx worsen.

## 2021-06-13 DIAGNOSIS — R569 Unspecified convulsions: Secondary | ICD-10-CM

## 2021-06-13 DIAGNOSIS — I5022 Chronic systolic (congestive) heart failure: Secondary | ICD-10-CM

## 2021-06-13 DIAGNOSIS — I10 Essential (primary) hypertension: Secondary | ICD-10-CM

## 2021-06-13 DIAGNOSIS — E039 Hypothyroidism, unspecified: Secondary | ICD-10-CM | POA: Diagnosis not present

## 2021-06-13 DIAGNOSIS — W540XXA Bitten by dog, initial encounter: Secondary | ICD-10-CM

## 2021-06-13 DIAGNOSIS — I6523 Occlusion and stenosis of bilateral carotid arteries: Secondary | ICD-10-CM

## 2021-06-13 DIAGNOSIS — I255 Ischemic cardiomyopathy: Secondary | ICD-10-CM

## 2021-06-13 DIAGNOSIS — Z950 Presence of cardiac pacemaker: Secondary | ICD-10-CM | POA: Diagnosis not present

## 2021-06-13 DIAGNOSIS — R55 Syncope and collapse: Secondary | ICD-10-CM | POA: Diagnosis not present

## 2021-06-13 LAB — CBG MONITORING, ED: Glucose-Capillary: 108 mg/dL — ABNORMAL HIGH (ref 70–99)

## 2021-06-13 MED ORDER — CLOPIDOGREL BISULFATE 75 MG PO TABS
75.0000 mg | ORAL_TABLET | Freq: Every day | ORAL | Status: DC
  Administered 2021-06-13: 75 mg via ORAL
  Filled 2021-06-13: qty 1

## 2021-06-13 MED ORDER — CITALOPRAM HYDROBROMIDE 20 MG PO TABS
20.0000 mg | ORAL_TABLET | Freq: Every day | ORAL | Status: DC
  Administered 2021-06-13: 20 mg via ORAL
  Filled 2021-06-13: qty 1

## 2021-06-13 MED ORDER — METOPROLOL TARTRATE 25 MG PO TABS
25.0000 mg | ORAL_TABLET | Freq: Two times a day (BID) | ORAL | Status: DC
  Administered 2021-06-13: 25 mg via ORAL
  Filled 2021-06-13: qty 1

## 2021-06-13 MED ORDER — PANTOPRAZOLE SODIUM 40 MG PO TBEC
40.0000 mg | DELAYED_RELEASE_TABLET | Freq: Every day | ORAL | Status: DC
  Administered 2021-06-13: 40 mg via ORAL
  Filled 2021-06-13: qty 1

## 2021-06-13 MED ORDER — LEVOTHYROXINE SODIUM 50 MCG PO TABS
125.0000 ug | ORAL_TABLET | Freq: Every day | ORAL | Status: DC
  Administered 2021-06-13: 125 ug via ORAL
  Filled 2021-06-13: qty 3

## 2021-06-13 MED ORDER — ROSUVASTATIN CALCIUM 20 MG PO TABS
20.0000 mg | ORAL_TABLET | Freq: Every day | ORAL | Status: DC
  Administered 2021-06-13: 20 mg via ORAL
  Filled 2021-06-13: qty 1

## 2021-06-13 MED ORDER — AMLODIPINE BESYLATE 5 MG PO TABS
5.0000 mg | ORAL_TABLET | Freq: Every day | ORAL | Status: DC
  Administered 2021-06-13: 5 mg via ORAL
  Filled 2021-06-13: qty 1

## 2021-06-13 MED ORDER — AMOXICILLIN-POT CLAVULANATE 875-125 MG PO TABS: 1.0000 | ORAL_TABLET | Freq: Two times a day (BID) | ORAL | 0 refills | Status: AC

## 2021-06-13 MED ORDER — ASPIRIN EC 81 MG PO TBEC
81.0000 mg | DELAYED_RELEASE_TABLET | Freq: Every day | ORAL | Status: DC
  Administered 2021-06-13: 81 mg via ORAL
  Filled 2021-06-13: qty 1

## 2021-06-13 NOTE — Procedures (Signed)
Patient Name: Daniel Austin  MRN: [id number]  Epilepsy Attending: Lora Havens  Referring Physician/Provider: Dr Judd Gaudier Date: 06/13/2021 Duration: 21.20 mins  Patient history: 80yo M presented following a syncopal event with seizure-like activity that occurred while having a wound washed in preparation for sutures. bony injury. EEG to evaluate for seizure.  Level of alertness: Awake  AEDs during EEG study: None  Technical aspects: This EEG study was done with scalp electrodes positioned according to the 10-20 International system of electrode placement. Electrical activity was acquired at a sampling rate of 500Hz  and reviewed with a high frequency filter of 70Hz  and a low frequency filter of 1Hz . EEG data were recorded continuously and digitally stored.   Description: The posterior dominant rhythm consists of 8 Hz activity of moderate voltage (25-35 uV) seen predominantly in posterior head regions, symmetric and reactive to eye opening and eye closing. Physiologic photic driving was not seen during photic stimulation.  Hyperventilation was not performed.     IMPRESSION: This study is within normal limits. No seizures or epileptiform discharges were seen throughout the recording.  [name]

## 2021-06-13 NOTE — ED Notes (Signed)
Dr. Damita Dunnings & Spouse @ the bedside.

## 2021-06-13 NOTE — ED Notes (Signed)
Pt and wife stating they want to discharge home and had not planned to be admitted. Stating that MD last night told them he would only be here for 24hr observe. Sent msg to Dr Cyndia Skeeters to inform of pt wishes.

## 2021-06-13 NOTE — Discharge Summary (Signed)
Physician Discharge Summary  Daniel Austin IWP:[id number] DOB: [birth year] DOA: 06/12/2021  PCP: Einar Pheasant, MD  Admit date: 06/12/2021 Discharge date: 06/13/2021 Admitted From: Home Disposition: Home Recommendations for Outpatient Follow-up:  Follow ups as below. Please obtain CBC/BMP/Mag at follow up Please follow up on the following pending results: None Home Health: Not indicated Equipment/Devices: Not indicated Discharge Condition: Stable CODE STATUS: Full code  Follow-up Information     Einar Pheasant, MD. Schedule an appointment as soon as possible for a visit in 1 week(s).   Specialty: Internal Medicine Why: For suture removal Contact information: [address] [place] [address] [phone]                Hospital Course: 80 year old M with PMH of CAD/stents, ICM/systolic CHF, right CAS s/p CEA with no hemodynamically significant residual stenosis, AVB s/p PPM, HTN and hypothyroidism brought to ED from urgent care after having a syncopal episode with seizure-like activity during wound suture for dog bite to his right hand.  On arrival, vitals within normal.  Basic labs, serial troponin, EKG, CT head and chest x-ray without acute finding.  No significant event on PPM interrogation.  He was admitted for observation overnight.  EEG and echocardiogram ordered.  Patient remained stable overnight and the next day.  EEG without seizure or epileptiform discharge.  Orthostatic vitals negative.  Evaluated by therapy and no need identified.  He feels well and ready to go home.  Not able to get his TTE on time due to long back log.  Patient has significant cardiac comorbidities but his syncope seems to be vasovagal, and he already has a pacemaker although this does not completely exclude cardiac etiology of his syncope.  He voiced understanding his risk and chose to go home and follow-up with his cardiologist.  He was counseled to take precaution  until he sees his cardiologist.   In regards to dog bite, patient reported that the dog is fully vaccinated.  He had Tdap at urgent care.  He has sutures and started on Augmentin.  He is discharged with Augmentin for a total of 7 days.  Patient to follow-up with PCP or at urgent care for suture removal in 5 to 7 days.   See individual problem list below for more on hospital course.  Discharge Diagnoses:  Syncope/seizure-like activity-likely vasovagal syncope.  Cardiac evaluation without significant finding.  Patient remained stable.  Able to ambulate independently without problem.  Orthostatic vitals negative. -Outpatient follow-up with cardiology -Advised to take precaution until follow-up with cardiology  Dog bite-reportedly the dog is fully vaccinated.  Patient had Tdap in urgent care -Suture removal in 5 to 7 days -Continue p.o. Augmentin for for 6 more days -See wound care instruction below  History of CAD/stents-stable ICM/chronic systolic CHF-stable -Continue home medications  Right CAS s/p CEA with no hemodynamically significant residual stenosis -Continue DAPT  AVB s/p PPM-no significant event on PPM interrogation  Essential hypertension: Normotensive -Continue home meds  Hypothyroidism -Continue home Synthroid     Body mass index is 27.89 kg/m.           Discharge Exam: Vitals:   06/13/21 1500 06/13/21 1600 06/13/21 1645 06/13/21 1724  BP: (!) 143/91 (!) 163/90  (!) 160/88  Pulse: 78 76 77 80  Temp:      Resp: 16 18 13 20   Height:      Weight:      SpO2: 99% 97% 98% 97%  TempSrc:  BMI (Calculated):         GENERAL: No apparent distress.  Nontoxic. HEENT: MMM.  Vision and hearing grossly intact.  NECK: Supple.  No apparent JVD.  RESP: On RA.  No IWOB.  Fair aeration bilaterally. CVS:  RRR. Heart sounds normal.  ABD/GI/GU: Bowel sounds present. Soft. Non tender.  MSK/EXT:  Moves extremities. No apparent deformity. No edema.  Dressing over  right fourth and fifth fingers DCI.  Slight swelling proximally.  Neurovascular intact. SKIN: Dressing over right fourth and fifth fingers as above. NEURO: Awake, alert and oriented appropriately. Speech clear. Cranial nerves II-XII intact. Motor 5/5 in all muscle groups of UE and LE bilaterally, Normal tone. Light sensation intact in all dermatomes of upper and lower ext bilaterally. Patellar reflex symmetric.  No pronator drift.  Finger to nose intact. PSYCH: Calm. Normal affect.   Discharge Instructions  Discharge Instructions     Call MD for:  difficulty breathing, headache or visual disturbances   Complete by: As directed    Call MD for:  extreme fatigue   Complete by: As directed    Call MD for:  persistant dizziness or light-headedness   Complete by: As directed    Call MD for:  severe uncontrolled pain   Complete by: As directed    Diet - low sodium heart healthy   Complete by: As directed    Discharge instructions   Complete by: As directed    It has been a pleasure taking care of you!  You were hospitalized due to syncope, which is likely vasovagal from pain during skin repair.  Your EEG did not show seizure activity.  Please continue your antibiotics for dog bite wound. Follow-up with your primary care doctor in 5 to 7 days for suture removal. We also recommend follow up with your cardiologist as soon as possible.  Review your new medication list and the directions on your medications before you take them.   Take care,   Discharge wound care:   Complete by: As directed    You may wash with soap and water, and tap dry. Do not soak.  Follow-up with your primary care doctor or at urgent care in 5 to 7 days for suture removal   Increase activity slowly   Complete by: As directed       Allergies as of 06/13/2021       Reactions   Beta Adrenergic Blockers Other (See Comments)   Second Degree Heart Block   Lipitor [atorvastatin] Other (See Comments)   Myalgia         Medication List     STOP taking these medications    naproxen sodium 220 MG tablet Commonly known as: ALEVE       TAKE these medications    acetaminophen 500 MG tablet Commonly known as: TYLENOL Take 1,000 mg by mouth every 6 (six) hours as needed.   Align 4 MG Caps One tablet per day   amLODipine 5 MG tablet Commonly known as: NORVASC Take 1 tablet (5 mg total) by mouth daily.   amoxicillin-clavulanate 875-125 MG tablet Commonly known as: AUGMENTIN Take 1 tablet by mouth every 12 (twelve) hours for 6 days.   Aspirin 81 MG EC tablet Take 81 mg by mouth daily.   citalopram 20 MG tablet Commonly known as: CELEXA TAKE 1 TABLET BY MOUTH EVERY DAY   clopidogrel 75 MG tablet Commonly known as: PLAVIX TAKE 1 TABLET BY MOUTH EVERY DAY   fexofenadine 180 MG  tablet Commonly known as: ALLEGRA Take 180 mg by mouth daily. As needed   levothyroxine 125 MCG tablet Commonly known as: SYNTHROID TAKE 1 TABLET (125 MCG TOTAL) BY MOUTH DAILY BEFORE BREAKFAST.   lisinopril 40 MG tablet Commonly known as: ZESTRIL TAKE 1 TABLET BY MOUTH EVERY DAY   LORazepam 1 MG tablet Commonly known as: ATIVAN TAKE 1 TABLET BY MOUTH EVERY DAY AT BEDTIME AS NEEDED   metoprolol tartrate 25 MG tablet Commonly known as: LOPRESSOR TAKE 1 TABLET BY MOUTH TWICE A DAY   nitroGLYCERIN 0.4 MG SL tablet Commonly known as: NITROSTAT Place 0.4 mg under the tongue every 5 (five) minutes as needed. If a third tablet is needed please call 911.   pantoprazole 40 MG tablet Commonly known as: PROTONIX TAKE 1 TABLET BY MOUTH EVERY DAY   rosuvastatin 20 MG tablet Commonly known as: CRESTOR TAKE 1 TABLET BY MOUTH EVERY DAY AT NIGHT   triamcinolone 55 MCG/ACT Aero nasal inhaler Commonly known as: NASACORT Place into the nose.   triamcinolone cream 0.1 % Commonly known as: KENALOG Apply 1 application topically 2 (two) times daily.               Discharge Care Instructions  (From  admission, onward)           Start     Ordered   06/13/21 0000  Discharge wound care:       Comments: You may wash with soap and water, and tap dry. Do not soak.  Follow-up with your primary care doctor or at urgent care in 5 to 7 days for suture removal   06/13/21 1642            Consultations: None  Procedures/Studies:   CT HEAD WO CONTRAST (5MM)  Result Date: 06/12/2021 CLINICAL DATA:  80 year old male with history of nontraumatic seizure. EXAM: CT HEAD WITHOUT CONTRAST TECHNIQUE: Contiguous axial images were obtained from the base of the skull through the vertex without intravenous contrast. COMPARISON:  No priors. FINDINGS: Brain: Mild cerebral atrophy. Patchy and confluent areas of decreased attenuation are noted throughout the deep and periventricular white matter of the cerebral hemispheres bilaterally, compatible with chronic microvascular ischemic disease. In addition, there are more well-defined areas of low attenuation in the right frontal lobe and right parietooccipital region, compatible with areas of encephalomalacia from prior right sided watershed infarcts. No evidence of acute infarction, hemorrhage, hydrocephalus, extra-axial collection or mass lesion/mass effect. Vascular: Numerous atherosclerotic calcifications in the vertebral arteries bilaterally. Skull: Normal. Negative for fracture or focal lesion. Sinuses/Orbits: No acute finding. Other: None. IMPRESSION: 1. No acute intracranial abnormalities. 2. Mild cerebral atrophy with extensive chronic microvascular ischemic changes in the cerebral white matter and old right-sided watershed infarcts, as above. Electronically Signed   By: Vinnie Langton M.D.   On: 06/12/2021 19:45   DG Chest Portable 1 View  Result Date: 06/12/2021 CLINICAL DATA:  Syncope.  Possible seizure. EXAM: PORTABLE CHEST 1 VIEW COMPARISON:  Chest x-ray dated November 09, 2015. FINDINGS: Unchanged left chest wall pacemaker. The heart size and  mediastinal contours are within normal limits. Normal pulmonary vascularity. No focal consolidation, pleural effusion, or pneumothorax. No acute osseous abnormality. IMPRESSION: No active disease. Electronically Signed   By: Titus Dubin M.D.   On: 06/12/2021 19:42   DG Finger Little Right  Result Date: 06/12/2021 CLINICAL DATA:  Puncture wound to the right fifth finger from a dog bite. EXAM: RIGHT LITTLE FINGER 2+V COMPARISON:  None. FINDINGS: Diffuse bone  demineralization. Degenerative changes in the interphalangeal and metacarpal phalangeal joints. No evidence of acute fracture or dislocation. No focal bone lesions. Soft tissue defect along the dorsum consistent with history of dog bite. Mild soft tissue swelling. No radiopaque soft tissue foreign bodies. IMPRESSION: No acute bony abnormalities. Degenerative changes and demineralization. No radiopaque soft tissue foreign bodies. Electronically Signed   By: Lucienne Capers M.D.   On: 06/12/2021 19:42   EEG adult  Result Date: 06/13/2021 Lora Havens, MD     06/13/2021  1:51 PM Patient Name: Daniel Austin MRN: [id number] Epilepsy Attending: Lora Havens Referring Physician/Provider: Dr Judd Gaudier Date: 06/13/2021 Duration: 21.20 mins Patient history: 80yo M presented following a syncopal event with seizure-like activity that occurred while having a wound washed in preparation for sutures. bony injury. EEG to evaluate for seizure. Level of alertness: Awake AEDs during EEG study: None Technical aspects: This EEG study was done with scalp electrodes positioned according to the 10-20 International system of electrode placement. Electrical activity was acquired at a sampling rate of 500Hz  and reviewed with a high frequency filter of 70Hz  and a low frequency filter of 1Hz . EEG data were recorded continuously and digitally stored. Description: The posterior dominant rhythm consists of 8 Hz activity of moderate voltage (25-35 uV) seen predominantly  in posterior head regions, symmetric and reactive to eye opening and eye closing. Physiologic photic driving was not seen during photic stimulation.  Hyperventilation was not performed.   IMPRESSION: This study is within normal limits. No seizures or epileptiform discharges were seen throughout the recording. Priyanka Barbra Sarks   VAS US CAROTID  Result Date: 06/06/2021 Carotid Arterial Duplex Study Patient Name:  RAIDEN HAYDU  Date of Exam:   05/23/2021 Medical Rec #: [id number]         Accession #:    [id number] Date of Birth: 24-Dec-1940         Patient Gender: M Patient Age:   16 years Exam Location:  [facility] Procedure:      VAS US CAROTID Referring Phys: Leotis Pain --------------------------------------------------------------------------------  Indications:       Carotid artery disease. Comparison Study:  05/24/2020 Performing Technologist: Almira Coaster RVS  Examination Guidelines: A complete evaluation includes B-mode imaging, spectral Doppler, color Doppler, and power Doppler as needed of all accessible portions of each vessel. Bilateral testing is considered an integral part of a complete examination. Limited examinations for reoccurring indications may be performed as noted.  Right Carotid Findings: +----------+--------+--------+--------+------------------+--------+           PSV cm/sEDV cm/sStenosisPlaque DescriptionComments +----------+--------+--------+--------+------------------+--------+ CCA Prox  73      10                                         +----------+--------+--------+--------+------------------+--------+ CCA Mid   79      13                                         +----------+--------+--------+--------+------------------+--------+ CCA Distal78      18                                         +----------+--------+--------+--------+------------------+--------+ ICA Prox  30      7                                           +----------+--------+--------+--------+------------------+--------+ ICA Mid   54      10                                         +----------+--------+--------+--------+------------------+--------+ ICA Distal35      8                                          +----------+--------+--------+--------+------------------+--------+ ECA       78      9                                          +----------+--------+--------+--------+------------------+--------+ +----------+--------+-------+--------+-------------------+           PSV cm/sEDV cmsDescribeArm Pressure (mmHG) +----------+--------+-------+--------+-------------------+ [id number]      0                                  +----------+--------+-------+--------+-------------------+ +---------+--------+--+--------+--+ VertebralPSV cm/s38EDV cm/s10 +---------+--------+--+--------+--+  Left Carotid Findings: +----------+--------+--------+--------+------------------+--------+           PSV cm/sEDV cm/sStenosisPlaque DescriptionComments +----------+--------+--------+--------+------------------+--------+ CCA Prox  112     16                                         +----------+--------+--------+--------+------------------+--------+ CCA Mid   94      15                                         +----------+--------+--------+--------+------------------+--------+ CCA Distal85      17                                         +----------+--------+--------+--------+------------------+--------+ ICA Prox  33      10                                         +----------+--------+--------+--------+------------------+--------+ ICA Mid   39      15                                         +----------+--------+--------+--------+------------------+--------+ ICA Distal46      13                                         +----------+--------+--------+--------+------------------+--------+ ECA  56      0                                           +----------+--------+--------+--------+------------------+--------+ +----------+--------+--------+--------+-------------------+           PSV cm/sEDV cm/sDescribeArm Pressure (mmHG) +----------+--------+--------+--------+-------------------+ [id number]      0                                   +----------+--------+--------+--------+-------------------+ +---------+--------+--+--------+--+ VertebralPSV cm/s66EDV cm/s13 +---------+--------+--+--------+--+   Summary: Right Carotid: Velocities in the right ICA are consistent with a 1-39% stenosis. Left Carotid: Velocities in the left ICA are consistent with a 1-39% stenosis. Vertebrals:  Bilateral vertebral arteries demonstrate antegrade flow. Subclavians: Normal flow hemodynamics were seen in bilateral subclavian              arteries. *See table(s) above for measurements and observations.  Electronically signed by Leotis Pain MD on 06/06/2021 at 12:53:04 PM.    Final        The results of significant diagnostics from this hospitalization (including imaging, microbiology, ancillary and laboratory) are listed below for reference.     Microbiology: Recent Results (from the past 240 hour(s))  Resp Panel by RT-PCR (Flu A&B, Covid) Nasopharyngeal Swab     Status: None   Collection Time: 06/12/21  7:07 PM   Specimen: Nasopharyngeal Swab; Nasopharyngeal(NP) swabs in vial transport medium  Result Value Ref Range Status   SARS Coronavirus 2 by RT PCR NEGATIVE NEGATIVE Final    Comment: (NOTE) SARS-CoV-2 target nucleic acids are NOT DETECTED.  The SARS-CoV-2 RNA is generally detectable in upper respiratory specimens during the acute phase of infection. The lowest concentration of SARS-CoV-2 viral copies this assay can detect is 138 copies/mL. A negative result does not preclude SARS-Cov-2 infection and should not be used as the sole basis for treatment or other patient management decisions. A negative  result may occur with  improper specimen collection/handling, submission of specimen other than nasopharyngeal swab, presence of viral mutation(s) within the areas targeted by this assay, and inadequate number of viral copies(<138 copies/mL). A negative result must be combined with clinical observations, patient history, and epidemiological information. The expected result is Negative.  Fact Sheet for Patients:  EntrepreneurPulse.com.au  Fact Sheet for Healthcare Providers:  IncredibleEmployment.be  This test is no t yet approved or cleared by the [place] FDA and  has been authorized for detection and/or diagnosis of SARS-CoV-2 by FDA under an Emergency Use Authorization (EUA). This EUA will remain  in effect (meaning this test can be used) for the duration of the COVID-19 declaration under Section 564(b)(1) of the Act, 21 U.S.C.section 360bbb-3(b)(1), unless the authorization is terminated  or revoked sooner.       Influenza A by PCR NEGATIVE NEGATIVE Final   Influenza B by PCR NEGATIVE NEGATIVE Final    Comment: (NOTE) The Xpert Xpress SARS-CoV-2/FLU/RSV plus assay is intended as an aid in the diagnosis of influenza from Nasopharyngeal swab specimens and should not be used as a sole basis for treatment. Nasal washings and aspirates are unacceptable for Xpert Xpress SARS-CoV-2/FLU/RSV testing.  Fact Sheet for Patients: EntrepreneurPulse.com.au  Fact Sheet for Healthcare Providers: IncredibleEmployment.be  This test is not yet approved or cleared by the [place] FDA and has been authorized for detection and/or diagnosis  of SARS-CoV-2 by FDA under an Emergency Use Authorization (EUA). This EUA will remain in effect (meaning this test can be used) for the duration of the COVID-19 declaration under Section 564(b)(1) of the Act, 21 U.S.C. section 360bbb-3(b)(1), unless the authorization is  terminated or revoked.  Performed at [facility], [address] [place],  [address]      Labs:  CBC: Recent Labs  Lab 06/12/21 1908  WBC 6.3  NEUTROABS 3.5  HGB 12.4*  HCT 36.5*  MCV 88.2  PLT 223   BMP &GFR Recent Labs  Lab 06/12/21 1908  NA 132*  K 3.5  CL 101  CO2 22  GLUCOSE 140*  BUN 8  CREATININE 0.96  CALCIUM 8.8*  MG 2.1   Estimated Creatinine Clearance: 69.6 mL/min (by C-G formula based on SCr of 0.96 mg/dL). Liver & Pancreas: Recent Labs  Lab 06/12/21 1908  AST 21  ALT 11  ALKPHOS 87  BILITOT 0.9  PROT 6.7  ALBUMIN 4.2   No results for input(s): LIPASE, AMYLASE in the last 168 hours. No results for input(s): AMMONIA in the last 168 hours. Diabetic: No results for input(s): HGBA1C in the last 72 hours. Recent Labs  Lab 06/13/21 0738  GLUCAP 108*   Cardiac Enzymes: No results for input(s): CKTOTAL, CKMB, CKMBINDEX, TROPONINI in the last 168 hours. No results for input(s): PROBNP in the last 8760 hours. Coagulation Profile: No results for input(s): INR, PROTIME in the last 168 hours. Thyroid Function Tests: No results for input(s): TSH, T4TOTAL, FREET4, T3FREE, THYROIDAB in the last 72 hours. Lipid Profile: No results for input(s): CHOL, HDL, LDLCALC, TRIG, CHOLHDL, LDLDIRECT in the last 72 hours. Anemia Panel: No results for input(s): VITAMINB12, FOLATE, FERRITIN, TIBC, IRON, RETICCTPCT in the last 72 hours. Urine analysis:    Component Value Date/Time   COLORURINE YELLOW 06/12/2021 2228   APPEARANCEUR CLEAR 06/12/2021 2228   LABSPEC 1.010 06/12/2021 2228   PHURINE 5.5 06/12/2021 2228   GLUCOSEU NEGATIVE 06/12/2021 2228   HGBUR TRACE (A) 06/12/2021 2228   BILIRUBINUR NEGATIVE 06/12/2021 2228   KETONESUR NEGATIVE 06/12/2021 2228   PROTEINUR NEGATIVE 06/12/2021 2228   NITRITE NEGATIVE 06/12/2021 2228   LEUKOCYTESUR NEGATIVE 06/12/2021 2228   Sepsis Labs: Invalid input(s): PROCALCITONIN, LACTICIDVEN   Time  coordinating discharge: 45 minutes  SIGNED:  Mercy Riding, MD  [facility] 06/13/2021, 6:43 PM

## 2021-06-13 NOTE — Evaluation (Signed)
Physical Therapy Evaluation Patient Details Name: Daniel Austin MRN: [id number] DOB: May 05, 1941 Today's Date: 06/13/2021  History of Present Illness  Daniel Austin is an 84yoM who comes to [facility] on 06/12/21 after puncture wound to Rt 5th finger, pt reports dog bite. Pt referred to ED by urgent care due to concerns over seizure-like activity. PMH: anemia, GAD, CAD, cardiomyopathy, CAS, CHF, depression,  GERD, HTN, PPM, CVA, syncope (pre PPM)  Clinical Impression  Pt admitted with above diagnosis. Pt currently with functional limitations due to the deficits listed below (see "PT Problem List"). Patient agreeable to PT evaluation. Patient provides detailed description of PLOF and home environment. Patient's assessment this date reveals the patient requires near baseline performance of mobility, the only impairment imposed by lines and leads. Pt performs all mobility with confidence and without any frank impairment. Patient is at baseline, all education completed, and time is given to address all questions/concerns. No additional skilled PT services needed at this time, PT signing off. PT recommends daily ambulation ad lib or with nursing staff as needed to prevent deconditioning.       Recommendations for follow up therapy are one component of a multi-disciplinary discharge planning process, led by the attending physician.  Recommendations may be updated based on patient status, additional functional criteria and insurance authorization.  Follow Up Recommendations No PT follow up    Equipment Recommendations  None recommended by PT    Recommendations for Other Services       Precautions / Restrictions Precautions Precautions: Fall Restrictions Weight Bearing Restrictions: No      Mobility  Bed Mobility               General bed mobility comments: standing at entry    Transfers Overall transfer level: Independent Equipment used: None                 Ambulation/Gait Ambulation/Gait assistance: Independent Gait Distance (Feet): 350 Feet Assistive device: None Gait Pattern/deviations: WFL(Within Functional Limits) Gait velocity: 0.53m/s      Stairs            Wheelchair Mobility    Modified Rankin (Stroke Patients Only)       Balance Overall balance assessment: Independent                                           Pertinent Vitals/Pain Pain Assessment: No/denies pain    Home Living Family/patient expects to be discharged to:: Private residence Living Arrangements: Spouse/significant other   Type of Home: House Home Access: Stairs to enter Entrance Stairs-Rails: Chemical engineer of Steps: 5 Home Layout: One level Home Equipment: Environmental consultant - 2 wheels;Walker - 4 wheels;Cane - single point      Prior Function Level of Independence: Independent         Comments: still drives, makes groceries, no reported difficulty with AMB outside of the home. Denies any falls in past 6 months.     Hand Dominance   Dominant Hand: Right    Extremity/Trunk Assessment                Communication      Cognition Arousal/Alertness: Awake/alert Behavior During Therapy: WFL for tasks assessed/performed Overall Cognitive Status: Within Functional Limits for tasks assessed  General Comments      Exercises     Assessment/Plan    PT Assessment Patent does not need any further PT services  PT Problem List         PT Treatment Interventions      PT Goals (Current goals can be found in the Care Plan section)  Acute Rehab PT Goals PT Goal Formulation: All assessment and education complete, DC therapy    Frequency     Barriers to discharge        Co-evaluation               AM-PAC PT "6 Clicks" Mobility  Outcome Measure Help needed turning from your back to your side while in a flat bed without using  bedrails?: None Help needed moving from lying on your back to sitting on the side of a flat bed without using bedrails?: None Help needed moving to and from a bed to a chair (including a wheelchair)?: None Help needed standing up from a chair using your arms (e.g., wheelchair or bedside chair)?: None Help needed to walk in hospital room?: A Little Help needed climbing 3-5 steps with a railing? : A Little 6 Click Score: 22    End of Session   Activity Tolerance: Patient tolerated treatment well;No increased pain Patient left: in chair;with family/visitor present;with call bell/phone within reach Nurse Communication: Mobility status PT Visit Diagnosis: History of falling (Z91.81)    Time: 6767-2094 PT Time Calculation (min) (ACUTE ONLY): 15 min   Charges:   PT Evaluation $PT Eval Low Complexity: 1 Low        9:37 AM, 06/13/21 Daniel Austin, PT, DPT Physical Therapist - [facility]  [phone] ([place])     Daniel Austin 06/13/2021, 9:35 AM

## 2021-06-13 NOTE — ED Notes (Signed)
Pts Spouse provided a recliner & blanket.

## 2021-06-13 NOTE — ED Notes (Signed)
Pts wound was dressed with nonadherent gauze and curlex.

## 2021-06-13 NOTE — Telephone Encounter (Signed)
Fyi dr scott  Call pt  See how he is doing I would advise is seen in ED for dog bite for which he doesn't know.  He may require antibiotics and most certainly updating his tetanus vaccine He may require rabies evaluation or animal to be placed in quarantine if unsure of rabies vaccine status

## 2021-06-13 NOTE — ED Notes (Signed)
Breakfast tray at bedside. Pt sitting in recliner, wife at bedside.  Bed linens and pt clothes were changed.

## 2021-06-13 NOTE — ED Notes (Signed)
EEG tech at bedside to perform EEG.

## 2021-06-13 NOTE — ED Notes (Signed)
Patient blood sugar was 108.

## 2021-06-13 NOTE — Progress Notes (Signed)
OT Cancellation Note  Patient Details Name: EULAN HEYWARD MRN: [id number] DOB: 12/18/1940   Cancelled Treatment:    Reason Eval/Treat Not Completed: OT screened, no needs identified, will sign off. Per PT report, pt is independent in all aspects of self care and functional mobility. Pt is at baseline.   Darleen Crocker, MS, OTR/L , CBIS ascom [phone]  06/13/21, 9:27 AM

## 2021-06-13 NOTE — ED Notes (Signed)
Pt offered a gown or paper scrubs and he declined.

## 2021-06-13 NOTE — ED Notes (Signed)
PT at bedside.  Orthostatic VS were obtained. Pulse did jump to 112-114 with standing but BP was stable and pt was asymptomatic.  Hospitalist did see pt at bedside.

## 2021-06-13 NOTE — ED Notes (Signed)
Pt still asking to discharge. Hospitalist did see pt this morning. Orthostatic VS were done and PT walked pt in hallway. Dr Cyndia Skeeters informed pt still asking to discharge home.

## 2021-06-13 NOTE — Progress Notes (Signed)
Eeg done 

## 2021-06-15 ENCOUNTER — Telehealth: Payer: Self-pay

## 2021-06-15 ENCOUNTER — Telehealth: Payer: Self-pay | Admitting: Internal Medicine

## 2021-06-15 NOTE — Telephone Encounter (Signed)
Patient notified and appointment has been placed.

## 2021-06-15 NOTE — Telephone Encounter (Signed)
Transition Care Management Unsuccessful Follow-up Telephone Call  Date of discharge and from where:  06/13/21 [facility]  Attempts:  1st Attempt  Reason for unsuccessful TCM follow-up call:  Unable to reach patient

## 2021-06-15 NOTE — Telephone Encounter (Signed)
Can schedule Wednesday 06/21/21 at 12:30.

## 2021-06-15 NOTE — Telephone Encounter (Signed)
Patient was seen at the ED for dog bite. He needs his stitches taken out next week. No appointments with any provider at time of phone call.

## 2021-06-16 NOTE — Telephone Encounter (Signed)
Transition Care Management Unsuccessful Follow-up Telephone Call  Date of discharge and from where:  06/13/21-[facility]  Attempts:  2nd Attempt  Reason for unsuccessful TCM follow-up call:  No answer/busy. Unable to leave message.

## 2021-06-19 DIAGNOSIS — Z9889 Other specified postprocedural states: Secondary | ICD-10-CM | POA: Diagnosis not present

## 2021-06-19 DIAGNOSIS — I340 Nonrheumatic mitral (valve) insufficiency: Secondary | ICD-10-CM | POA: Diagnosis not present

## 2021-06-19 DIAGNOSIS — I10 Essential (primary) hypertension: Secondary | ICD-10-CM | POA: Diagnosis not present

## 2021-06-19 DIAGNOSIS — I6529 Occlusion and stenosis of unspecified carotid artery: Secondary | ICD-10-CM | POA: Diagnosis not present

## 2021-06-19 DIAGNOSIS — I341 Nonrheumatic mitral (valve) prolapse: Secondary | ICD-10-CM | POA: Diagnosis not present

## 2021-06-19 DIAGNOSIS — R55 Syncope and collapse: Secondary | ICD-10-CM | POA: Diagnosis not present

## 2021-06-19 DIAGNOSIS — I6521 Occlusion and stenosis of right carotid artery: Secondary | ICD-10-CM | POA: Diagnosis not present

## 2021-06-19 DIAGNOSIS — I2510 Atherosclerotic heart disease of native coronary artery without angina pectoris: Secondary | ICD-10-CM | POA: Diagnosis not present

## 2021-06-19 DIAGNOSIS — E785 Hyperlipidemia, unspecified: Secondary | ICD-10-CM | POA: Diagnosis not present

## 2021-06-19 DIAGNOSIS — I5022 Chronic systolic (congestive) heart failure: Secondary | ICD-10-CM | POA: Diagnosis not present

## 2021-06-19 DIAGNOSIS — E7800 Pure hypercholesterolemia, unspecified: Secondary | ICD-10-CM | POA: Diagnosis not present

## 2021-06-19 NOTE — Telephone Encounter (Signed)
Patient does not qualify for TCM. ED visit without observation.

## 2021-06-21 ENCOUNTER — Ambulatory Visit (INDEPENDENT_AMBULATORY_CARE_PROVIDER_SITE_OTHER): Payer: Medicare Other | Admitting: Internal Medicine

## 2021-06-21 ENCOUNTER — Other Ambulatory Visit: Payer: Self-pay

## 2021-06-21 DIAGNOSIS — W540XXA Bitten by dog, initial encounter: Secondary | ICD-10-CM | POA: Diagnosis not present

## 2021-06-21 DIAGNOSIS — I10 Essential (primary) hypertension: Secondary | ICD-10-CM

## 2021-06-21 DIAGNOSIS — Z950 Presence of cardiac pacemaker: Secondary | ICD-10-CM

## 2021-06-21 DIAGNOSIS — I2510 Atherosclerotic heart disease of native coronary artery without angina pectoris: Secondary | ICD-10-CM

## 2021-06-21 DIAGNOSIS — R739 Hyperglycemia, unspecified: Secondary | ICD-10-CM | POA: Diagnosis not present

## 2021-06-21 DIAGNOSIS — R55 Syncope and collapse: Secondary | ICD-10-CM

## 2021-06-21 NOTE — Progress Notes (Signed)
Patient ID: Daniel Austin, male   DOB: 11/06/1940, 80 y.o.   MRN: [id number]   Subjective:    Patient ID: Daniel Austin, male    DOB: 18-Sep-1941, 80 y.o.   MRN: [id number]  This visit occurred during the SARS-CoV-2 public health emergency.  Safety protocols were in place, including screening questions prior to the visit, additional usage of staff PPE, and extensive cleaning of exam room while observing appropriate contact time as indicated for disinfecting solutions.   Patient here for work in appt.  Chief Complaint  Patient presents with   Suture / Staple Removal   .   HPI Here for ER f/u.  Was seen at [facility] 06/12/21 for dog bite.  While finger being irrigated, he lost consciousness.  Reports some seizure like activity.  EMS - EKG - EKG with ST elevations and LBBB.  F/u EKG - ST elevation resolved.  Transferred to ER.  CT - no acute intracranial abnormalities. CXR - no acute abnormality.  Finger xray - no acute bony abnormalities.  One suture placed - fifth finger.  Tdap given.  Placed on augmentin.  Saw cardiology 06/19/21.  Per note, pacemaker interrogation was performed, which revealed normal functioning dual chamber pacemaker.  Felt to be related to vasovagal episode and no further cardiac w/up felt warranted.  States he has felt fine since the episode.  No further syncope or near syncope.  No chest pain or sob reported.    Past Medical History:  Diagnosis Date   Anemia    Anxiety    Arthritis    CAD (coronary artery disease)    Cardiomyopathy ([place])    Carotid artery stenosis    high-grade symptomatic right sided s/p stent twice   CHF (congestive heart failure) (HCC)    chronic movement fluid and history of CHF he noticed in the    Depression    Fractures, multiple    Metal rod in left foot   GERD (gastroesophageal reflux disease)    Heart block    Hypercholesterolemia    Hyperglycemia    Hypertension    Hypothyroidism    Knee pain    Presence of permanent cardiac pacemaker     11/16/2015 Medtronic ADR01,   (2008 St Jude)   Shortness of breath dyspnea    Stroke [facility])    clot in neck broke off left side weakness 2008  resolved   Syncope    second degree heart block s/p St Jude's pacemaker placement 12/08 with his history is limited with any   Ulcer of gastric fundus    Past Surgical History:  Procedure Laterality Date   CARDIAC CATHETERIZATION     CATARACT EXTRACTION W/PHACO Right 03/07/2021   Procedure: CATARACT EXTRACTION PHACO AND INTRAOCULAR LENS PLACEMENT ([place]) RIGHT 8.69 00:55.2;  Surgeon: Birder Robson, MD;  Location: [place];  Service: Ophthalmology;  Laterality: Right;   CORONARY ANGIOPLASTY     stents x 3   heart stint  11/04/2014   [facility]   HERNIA REPAIR     inguinal   INSERT / REPLACE / REMOVE PACEMAKER     JOINT REPLACEMENT     right knee   KNEE ARTHROPLASTY Right 02/08/2016   Procedure: COMPUTER ASSISTED TOTAL KNEE ARTHROPLASTY;  Surgeon: Dereck Leep, MD;  Location: [facility] ORS;  Service: Orthopedics;  Laterality: Right;   KNEE ARTHROPLASTY Left 06/27/2016   Procedure: COMPUTER ASSISTED TOTAL KNEE ARTHROPLASTY;  Surgeon: Dereck Leep, MD;  Location: [facility] ORS;  Service:  Orthopedics;  Laterality: Left;   PACEMAKER INSERTION  08/2007   PACEMAKER INSERTION N/A 11/16/2015   Procedure: INSERTION PACEMAKER/ PACEMAKER CHANGE OUT;  Surgeon: Isaias Cowman, MD;  Location: [facility] ORS;  Service: Cardiovascular;  Laterality: N/A;   PERCUTANEOUS PLACEMENT INTRAVASCULAR STENT CERVICAL CAROTID ARTERY     twice, right carotid artery   Family History  Problem Relation Age of Onset   Heart disease Mother        CHF   Heart disease Father        CHF   Thyroid disease Father        hypothyroidism   Colon polyps Father    Diabetes Sister        diet controlled   Thyroid disease Sister    Cancer Sister        breast    Thyroid disease Sister    Cancer Sister        breast   Colon cancer Neg Hx    Prostate cancer Neg Hx     Social History   Socioeconomic History   Marital status: Married    Spouse name: Not on file   Number of children: 1   Years of education: Not on file   Highest education level: Not on file  Occupational History   Not on file  Tobacco Use   Smoking status: Never   Smokeless tobacco: Never  Vaping Use   Vaping Use: Never used  Substance and Sexual Activity   Alcohol use: No    Alcohol/week: 0.0 standard drinks   Drug use: No   Sexual activity: Not Currently  Other Topics Concern   Not on file  Social History Narrative   Not on file   Social Determinants of Health   Financial Resource Strain: Not on file  Food Insecurity: Not on file  Transportation Needs: Not on file  Physical Activity: Not on file  Stress: Not on file  Social Connections: Not on file     Review of Systems  Constitutional:  Negative for appetite change and unexpected weight change.  HENT:  Negative for congestion and sinus pressure.   Respiratory:  Negative for cough, chest tightness and shortness of breath.   Cardiovascular:  Negative for chest pain, palpitations and leg swelling.  Gastrointestinal:  Negative for abdominal pain, diarrhea, nausea and vomiting.  Genitourinary:  Negative for difficulty urinating and dysuria.  Musculoskeletal:  Negative for joint swelling and myalgias.  Skin:  Negative for color change and rash.  Neurological:  Negative for dizziness, light-headedness and headaches.  Psychiatric/Behavioral:  Negative for agitation and dysphoric mood.       Objective:     BP 114/72   Pulse 79   Temp 98 F (36.7 C)   Resp 16   Ht '5\' 11"'  (1.803 m)   Wt 191 lb 9.6 oz (86.9 kg)   SpO2 97%   BMI 26.72 kg/m  Wt Readings from Last 3 Encounters:  06/21/21 191 lb 9.6 oz (86.9 kg)  06/12/21 199 lb 15.3 oz (90.7 kg)  05/23/21 195 lb (88.5 kg)    Physical Exam Vitals reviewed.  Constitutional:      General: He is not in acute distress.    Appearance: Normal appearance. He is  well-developed.  HENT:     Head: Normocephalic and atraumatic.     Right Ear: External ear normal.     Left Ear: External ear normal.  Eyes:     General: No scleral icterus.  Right eye: No discharge.        Left eye: No discharge.     Conjunctiva/sclera: Conjunctivae normal.  Cardiovascular:     Rate and Rhythm: Normal rate and regular rhythm.  Pulmonary:     Effort: Pulmonary effort is normal. No respiratory distress.     Breath sounds: Normal breath sounds.  Abdominal:     General: Bowel sounds are normal.     Palpations: Abdomen is soft.     Tenderness: There is no abdominal tenderness.  Musculoskeletal:        General: No swelling or tenderness.     Cervical back: Neck supple. No tenderness.     Comments: Laceration - right fifth digit.  One suture - removed.  No surrounding erythema.  Non tender.    Lymphadenopathy:     Cervical: No cervical adenopathy.  Skin:    Findings: No erythema or rash.  Neurological:     Mental Status: He is alert.  Psychiatric:        Mood and Affect: Mood normal.        Behavior: Behavior normal.     Outpatient Encounter Medications as of 06/21/2021  Medication Sig   acetaminophen (TYLENOL) 500 MG tablet Take 1,000 mg by mouth every 6 (six) hours as needed.   amLODipine (NORVASC) 5 MG tablet Take 1 tablet (5 mg total) by mouth daily.   Aspirin 81 MG EC tablet Take 81 mg by mouth daily.   citalopram (CELEXA) 20 MG tablet TAKE 1 TABLET BY MOUTH EVERY DAY   clopidogrel (PLAVIX) 75 MG tablet TAKE 1 TABLET BY MOUTH EVERY DAY   fexofenadine (ALLEGRA) 180 MG tablet Take 180 mg by mouth daily. As needed   levothyroxine (SYNTHROID) 125 MCG tablet TAKE 1 TABLET (125 MCG TOTAL) BY MOUTH DAILY BEFORE BREAKFAST.   lisinopril (ZESTRIL) 40 MG tablet TAKE 1 TABLET BY MOUTH EVERY DAY   LORazepam (ATIVAN) 1 MG tablet TAKE 1 TABLET BY MOUTH EVERY DAY AT BEDTIME AS NEEDED   metoprolol tartrate (LOPRESSOR) 25 MG tablet TAKE 1 TABLET BY MOUTH TWICE A DAY    nitroGLYCERIN (NITROSTAT) 0.4 MG SL tablet Place 0.4 mg under the tongue every 5 (five) minutes as needed. If a third tablet is needed please call 911.   pantoprazole (PROTONIX) 40 MG tablet TAKE 1 TABLET BY MOUTH EVERY DAY   Probiotic Product (ALIGN) 4 MG CAPS One tablet per day   rosuvastatin (CRESTOR) 20 MG tablet TAKE 1 TABLET BY MOUTH EVERY DAY AT NIGHT   triamcinolone (NASACORT) 55 MCG/ACT AERO nasal inhaler Place into the nose.   triamcinolone cream (KENALOG) 0.1 % Apply 1 application topically 2 (two) times daily.   No facility-administered encounter medications on file as of 06/21/2021.     Lab Results  Component Value Date   WBC 6.3 06/12/2021   HGB 12.4 (L) 06/12/2021   HCT 36.5 (L) 06/12/2021   PLT 223 06/12/2021   GLUCOSE 140 (H) 06/12/2021   CHOL 137 03/14/2021   TRIG 123.0 03/14/2021   HDL 54.70 03/14/2021   LDLCALC 58 03/14/2021   ALT 11 06/12/2021   AST 21 06/12/2021   NA 132 (L) 06/12/2021   K 3.5 06/12/2021   CL 101 06/12/2021   CREATININE 0.96 06/12/2021   BUN 8 06/12/2021   CO2 22 06/12/2021   TSH 0.40 03/14/2021   PSA 0.37 03/14/2021   INR 1.13 06/11/2016   HGBA1C 6.1 03/14/2021   MICROALBUR 1.6 05/25/2014    CT HEAD  WO CONTRAST (5MM)  Result Date: 06/12/2021 CLINICAL DATA:  80 year old male with history of nontraumatic seizure. EXAM: CT HEAD WITHOUT CONTRAST TECHNIQUE: Contiguous axial images were obtained from the base of the skull through the vertex without intravenous contrast. COMPARISON:  No priors. FINDINGS: Brain: Mild cerebral atrophy. Patchy and confluent areas of decreased attenuation are noted throughout the deep and periventricular white matter of the cerebral hemispheres bilaterally, compatible with chronic microvascular ischemic disease. In addition, there are more well-defined areas of low attenuation in the right frontal lobe and right parietooccipital region, compatible with areas of encephalomalacia from prior right sided watershed  infarcts. No evidence of acute infarction, hemorrhage, hydrocephalus, extra-axial collection or mass lesion/mass effect. Vascular: Numerous atherosclerotic calcifications in the vertebral arteries bilaterally. Skull: Normal. Negative for fracture or focal lesion. Sinuses/Orbits: No acute finding. Other: None. IMPRESSION: 1. No acute intracranial abnormalities. 2. Mild cerebral atrophy with extensive chronic microvascular ischemic changes in the cerebral white matter and old right-sided watershed infarcts, as above. Electronically Signed   By: Vinnie Langton M.D.   On: 06/12/2021 19:45   DG Chest Portable 1 View  Result Date: 06/12/2021 CLINICAL DATA:  Syncope.  Possible seizure. EXAM: PORTABLE CHEST 1 VIEW COMPARISON:  Chest x-ray dated November 09, 2015. FINDINGS: Unchanged left chest wall pacemaker. The heart size and mediastinal contours are within normal limits. Normal pulmonary vascularity. No focal consolidation, pleural effusion, or pneumothorax. No acute osseous abnormality. IMPRESSION: No active disease. Electronically Signed   By: Titus Dubin M.D.   On: 06/12/2021 19:42   DG Finger Little Right  Result Date: 06/12/2021 CLINICAL DATA:  Puncture wound to the right fifth finger from a dog bite. EXAM: RIGHT LITTLE FINGER 2+V COMPARISON:  None. FINDINGS: Diffuse bone demineralization. Degenerative changes in the interphalangeal and metacarpal phalangeal joints. No evidence of acute fracture or dislocation. No focal bone lesions. Soft tissue defect along the dorsum consistent with history of dog bite. Mild soft tissue swelling. No radiopaque soft tissue foreign bodies. IMPRESSION: No acute bony abnormalities. Degenerative changes and demineralization. No radiopaque soft tissue foreign bodies. Electronically Signed   By: Lucienne Capers M.D.   On: 06/12/2021 19:42   EEG adult  Result Date: 06/13/2021 Lora Havens, MD     06/13/2021  1:51 PM Patient Name: Daniel Austin MRN: [id number]  Epilepsy Attending: Lora Havens Referring Physician/Provider: Dr Judd Gaudier Date: 06/13/2021 Duration: 21.20 mins Patient history: 80yo M presented following a syncopal event with seizure-like activity that occurred while having a wound washed in preparation for sutures. bony injury. EEG to evaluate for seizure. Level of alertness: Awake AEDs during EEG study: None Technical aspects: This EEG study was done with scalp electrodes positioned according to the 10-20 International system of electrode placement. Electrical activity was acquired at a sampling rate of '500Hz'  and reviewed with a high frequency filter of '70Hz'  and a low frequency filter of '1Hz' . EEG data were recorded continuously and digitally stored. Description: The posterior dominant rhythm consists of 8 Hz activity of moderate voltage (25-35 uV) seen predominantly in posterior head regions, symmetric and reactive to eye opening and eye closing. Physiologic photic driving was not seen during photic stimulation.  Hyperventilation was not performed.   IMPRESSION: This study is within normal limits. No seizures or epileptiform discharges were seen throughout the recording. [place]       Assessment & Plan:   Problem List Items Addressed This Visit     Coronary atherosclerosis of native coronary artery  Continue risk factor modification.  Continue crestor, metoprolol and lisinopril.  Followed by cardiology.       Dog bite    Lesion on fifth finger as outlined.  Suture removed.  No evidence of infection.  On augmentin.  Dog up to date with immunizations.  Follow.       Essential hypertension, benign    Blood pressure as outlined.  Continue lisinopril.  Also taking 55m of amlodipine.  Continue current medication regimen.  Follow pressures.  Follow metabolic panel.       Hyperglycemia    Low carb diet and exercise.  Follow met b and a1c.        Pacemaker    Apparently interrogated and ok.  Follow.       Syncope and  collapse    Had the episode as outlined while wound being irrigated.  ER evaluation as outlined.  Saw cardiology in follow up.  Felt related to vasovagal episode. No recurrence.  Follow.          CEinar Pheasant MD

## 2021-06-25 ENCOUNTER — Encounter: Payer: Self-pay | Admitting: Internal Medicine

## 2021-06-25 NOTE — Assessment & Plan Note (Signed)
Low carb diet and exercise.  Follow met b and a1c.

## 2021-06-25 NOTE — Assessment & Plan Note (Signed)
Had the episode as outlined while wound being irrigated.  ER evaluation as outlined.  Saw cardiology in follow up.  Felt related to vasovagal episode. No recurrence.  Follow.

## 2021-06-25 NOTE — Assessment & Plan Note (Signed)
Lesion on fifth finger as outlined.  Suture removed.  No evidence of infection.  On augmentin.  Dog up to date with immunizations.  Follow.

## 2021-06-25 NOTE — Assessment & Plan Note (Signed)
Blood pressure as outlined.  Continue lisinopril.  Also taking 5mg  of amlodipine.  Continue current medication regimen.  Follow pressures.  Follow metabolic panel.

## 2021-06-25 NOTE — Assessment & Plan Note (Signed)
Apparently interrogated and ok.  Follow.

## 2021-06-25 NOTE — Assessment & Plan Note (Signed)
Continue risk factor modification.  Continue crestor, metoprolol and lisinopril.  Followed by cardiology.  

## 2021-07-05 ENCOUNTER — Encounter: Payer: Self-pay | Admitting: *Deleted

## 2021-07-06 ENCOUNTER — Encounter: Payer: Self-pay | Admitting: *Deleted

## 2021-07-06 ENCOUNTER — Ambulatory Visit
Admission: RE | Admit: 2021-07-06 | Discharge: 2021-07-06 | Disposition: A | Discharge status: Home / Self Care | Payer: Medicare Other | Attending: Gastroenterology | Admitting: Gastroenterology

## 2021-07-06 ENCOUNTER — Ambulatory Visit: Payer: Medicare Other | Admitting: Anesthesiology

## 2021-07-06 ENCOUNTER — Encounter
Admission: RE | Disposition: A | Discharge status: Home / Self Care | Payer: Self-pay | Source: Home / Self Care | Attending: Gastroenterology

## 2021-07-06 DIAGNOSIS — Z7902 Long term (current) use of antithrombotics/antiplatelets: Secondary | ICD-10-CM | POA: Insufficient documentation

## 2021-07-06 DIAGNOSIS — Z8371 Family history of colonic polyps: Secondary | ICD-10-CM | POA: Diagnosis not present

## 2021-07-06 DIAGNOSIS — K635 Polyp of colon: Secondary | ICD-10-CM | POA: Diagnosis not present

## 2021-07-06 DIAGNOSIS — K5730 Diverticulosis of large intestine without perforation or abscess without bleeding: Secondary | ICD-10-CM | POA: Insufficient documentation

## 2021-07-06 DIAGNOSIS — I110 Hypertensive heart disease with heart failure: Secondary | ICD-10-CM | POA: Diagnosis not present

## 2021-07-06 DIAGNOSIS — D120 Benign neoplasm of cecum: Secondary | ICD-10-CM | POA: Diagnosis not present

## 2021-07-06 DIAGNOSIS — D649 Anemia, unspecified: Secondary | ICD-10-CM | POA: Diagnosis not present

## 2021-07-06 DIAGNOSIS — K640 First degree hemorrhoids: Secondary | ICD-10-CM | POA: Insufficient documentation

## 2021-07-06 DIAGNOSIS — K621 Rectal polyp: Secondary | ICD-10-CM | POA: Insufficient documentation

## 2021-07-06 DIAGNOSIS — Z955 Presence of coronary angioplasty implant and graft: Secondary | ICD-10-CM | POA: Diagnosis not present

## 2021-07-06 DIAGNOSIS — Z950 Presence of cardiac pacemaker: Secondary | ICD-10-CM | POA: Insufficient documentation

## 2021-07-06 DIAGNOSIS — I509 Heart failure, unspecified: Secondary | ICD-10-CM | POA: Diagnosis not present

## 2021-07-06 DIAGNOSIS — E039 Hypothyroidism, unspecified: Secondary | ICD-10-CM | POA: Diagnosis not present

## 2021-07-06 DIAGNOSIS — Z1211 Encounter for screening for malignant neoplasm of colon: Secondary | ICD-10-CM | POA: Insufficient documentation

## 2021-07-06 DIAGNOSIS — K649 Unspecified hemorrhoids: Secondary | ICD-10-CM | POA: Diagnosis not present

## 2021-07-06 DIAGNOSIS — Z86010 Personal history of colonic polyps: Secondary | ICD-10-CM | POA: Diagnosis not present

## 2021-07-06 DIAGNOSIS — K5790 Diverticulosis of intestine, part unspecified, without perforation or abscess without bleeding: Secondary | ICD-10-CM | POA: Diagnosis not present

## 2021-07-06 HISTORY — PX: COLONOSCOPY: SHX5424

## 2021-07-06 LAB — HM COLONOSCOPY

## 2021-07-06 SURGERY — COLONOSCOPY
Anesthesia: General

## 2021-07-06 MED ORDER — PROPOFOL 10 MG/ML IV BOLUS
INTRAVENOUS | Status: DC | PRN
Start: 2021-07-06 — End: 2021-07-06
  Administered 2021-07-06: 40 mg via INTRAVENOUS

## 2021-07-06 MED ORDER — PROPOFOL 500 MG/50ML IV EMUL
INTRAVENOUS | Status: DC | PRN
  Administered 2021-07-06: 100 ug/kg/min via INTRAVENOUS

## 2021-07-06 MED ORDER — SODIUM CHLORIDE 0.9 % IV SOLN: INTRAVENOUS | Status: DC

## 2021-07-06 MED ORDER — PHENYLEPHRINE HCL (PRESSORS) 10 MG/ML IV SOLN
INTRAVENOUS | Status: DC | PRN
  Administered 2021-07-06: 80 ug via INTRAVENOUS
  Administered 2021-07-06: 160 ug via INTRAVENOUS

## 2021-07-06 NOTE — Op Note (Signed)
[facility] Gastroenterology Patient Name: Daniel Austin Procedure Date: 07/06/2021 11:32 AM MRN: [id number] Account #: [id number] Date of Birth: [birth year] Admit Type: Outpatient Age: 80 Room: [facility] ENDO ROOM 1 Gender: Male Note Status: Finalized Instrument Name: Colonscope [id number] Procedure:             Colonoscopy Indications:           High risk colon cancer surveillance: Personal history                         of colonic polyps Providers:             Annamaria Helling DO, DO Referring MD:          Einar Pheasant, MD (Referring MD) Medicines:             Monitored Anesthesia Care Complications:         No immediate complications. Estimated blood loss:                         Minimal. Procedure:             Pre-Anesthesia Assessment:                        - Prior to the procedure, a History and Physical was                         performed, and patient medications and allergies were                         reviewed. The patient is competent. The risks and                         benefits of the procedure and the sedation options and                         risks were discussed with the patient. All questions                         were answered and informed consent was obtained.                         Patient identification and proposed procedure were                         verified by the physician, the nurse, the anesthetist                         and the technician in the endoscopy suite. Mental                         Status Examination: alert and oriented. Airway                         Examination: normal oropharyngeal airway and neck                         mobility. Respiratory Examination: clear to  auscultation. CV Examination: RRR, no murmurs, no S3                         or S4. Prophylactic Antibiotics: The patient does not                         require prophylactic antibiotics. Prior                          Anticoagulants: The patient has taken Plavix                         (clopidogrel), last dose was day of procedure. ASA                         Grade Assessment: III - A patient with severe systemic                         disease. After reviewing the risks and benefits, the                         patient was deemed in satisfactory condition to                         undergo the procedure. The anesthesia plan was to use                         monitored anesthesia care (MAC). Immediately prior to                         administration of medications, the patient was                         re-assessed for adequacy to receive sedatives. The                         heart rate, respiratory rate, oxygen saturations,                         blood pressure, adequacy of pulmonary ventilation, and                         response to care were monitored throughout the                         procedure. The physical status of the patient was                         re-assessed after the procedure.                        After obtaining informed consent, the colonoscope was                         passed under direct vision. Throughout the procedure,                         the patient's blood pressure, pulse, and oxygen  saturations were monitored continuously. The                         Colonoscope was introduced through the anus and                         advanced to the the cecum, identified by appendiceal                         orifice and ileocecal valve. The colonoscopy was                         performed without difficulty. The patient tolerated                         the procedure well. The quality of the bowel                         preparation was evaluated using the BBPS [facility] Bowel                         Preparation Scale) with scores of: Right Colon = 2                         (minor amount of residual staining, small fragments of                          stool and/or opaque liquid, but mucosa seen well),                         Transverse Colon = 3 (entire mucosa seen well with no                         residual staining, small fragments of stool or opaque                         liquid) and Left Colon = 2 (minor amount of residual                         staining, small fragments of stool and/or opaque                         liquid, but mucosa seen well). The total BBPS score                         equals 7. The quality of the bowel preparation was                         good. The ileocecal valve, appendiceal orifice, and                         rectum were photographed. Findings:      The perianal and digital rectal examinations were normal. Pertinent       negatives include normal sphincter tone.      Multiple small-mouthed diverticula were found in the entire colon.       Estimated blood loss: none.  Non-bleeding internal hemorrhoids were found during retroflexion. The       hemorrhoids were Grade I (internal hemorrhoids that do not prolapse).      A 2 to 3 mm polyp was found in the cecum. The polyp was sessile. The       polyp was removed with a cold biopsy forceps. Resection and retrieval       were complete. Estimated blood loss was minimal.      A 6 to 7 mm polyp was found in the cecum. The polyp was flat. The polyp       was removed with a cold biopsy forceps. Polyp removed with cold forceps       due to active plavix use. It appeared the entire polyp was resected, but       would likely need re-evaluation as this would normally be taken with       cold snare.      Two sessile polyps were found in the rectum. The polyps were 2 to 3 mm       in size. These polyps were removed with a cold biopsy forceps. Resection       and retrieval were complete. Estimated blood loss was minimal.      The exam was otherwise without abnormality on direct and retroflexion       views. Impression:            - Diverticulosis in the entire  examined colon.                        - Non-bleeding internal hemorrhoids.                        - One 2 to 3 mm polyp in the cecum, removed with a                         cold biopsy forceps. Resected and retrieved.                        - One 6 to 7 mm polyp in the cecum, removed with a                         cold biopsy forceps.                        - Two 2 to 3 mm polyps in the rectum, removed with a                         cold biopsy forceps. Resected and retrieved.                        - The examination was otherwise normal on direct and                         retroflexion views. Recommendation:        - Discharge patient to home.                        - Resume previous diet.                        - Continue present  medications.                        - Await pathology results.                        - - Repeat colonoscopy discuss with PCP and GI. Given                         piecemeal resection of polyp, can consider within 6-12                         months, however, given age and other comorbidities                         would be reasonable to not repeat as the patient has                         aged out of screening. Defer to shared patient                         decision making between PCP, primary GI provider, and                         patient/family.                        - Return to referring physician as previously                         scheduled.                        - The findings and recommendations were discussed with                         the patient's family. Procedure Code(s):     --- Professional ---                        [phone], Colonoscopy, flexible; with biopsy, single or                         multiple Diagnosis Code(s):     --- Professional ---                        Z86.010, Personal history of colonic polyps                        K64.0, First degree hemorrhoids                        K63.5, Polyp of colon                         K62.1, Rectal polyp                        K57.30, Diverticulosis of large intestine without                         perforation or abscess without  bleeding CPT copyright 2019 American [employer] Association. All rights reserved. The codes documented in this report are preliminary and upon coder review may  be revised to meet current compliance requirements. Volney American, DO Annamaria Helling DO, DO 07/06/2021 1:08:11 PM This report has been signed electronically. Number of Addenda: 0 Note Initiated On: 07/06/2021 11:32 AM Scope Withdrawal Time: 0 hours 22 minutes 38 seconds  Total Procedure Duration: 0 hours 31 minutes 58 seconds  Estimated Blood Loss:  Estimated blood loss was minimal.      [facility]

## 2021-07-06 NOTE — H&P (Signed)
[facility] Pre-Procedure H&P   Patient ID: Daniel Austin is a 80 y.o. male.  Gastroenterology Provider: Annamaria Helling, DO  Referring Provider: Octavia Bruckner, PA PCP: Einar Pheasant, MD  Date: 07/06/2021  HPI [name] is a 80 y.o. male who presents today for Colonoscopy for surveillance colonoscopy with personal h/o colon polyps.  Patient has not stopped his plavix prior to procedure.  No melena, hematochezia, diarrhea, or constipation. Very mild normocytic anemia. Non iron deficient. With hgb 12.6  CHF; s/p ppm 2008; PCI/DES 2016  Colon 2012- normal with diverticulosis; 2008 diverticulosis; 2005- 1cm adenomatous polyp.  Father - colon polyps  No other acute gi complaints  Past Medical History:  Diagnosis Date   Anemia    Anxiety    Arthritis    CAD (coronary artery disease)    Cardiomyopathy ([place])    Carotid artery stenosis    high-grade symptomatic right sided s/p stent twice   CHF (congestive heart failure) (HCC)    chronic movement fluid and history of CHF he noticed in the    Depression    Fractures, multiple    Metal rod in left foot   GERD (gastroesophageal reflux disease)    Heart block    Hypercholesterolemia    Hyperglycemia    Hypertension    Hypothyroidism    Knee pain    Presence of permanent cardiac pacemaker    11/16/2015 Medtronic ADR01,   (2008 St Jude)   Shortness of breath dyspnea    Stroke ([place])    clot in neck broke off left side weakness 2008  resolved   Syncope    second degree heart block s/p St Jude's pacemaker placement 12/08 with his history is limited with any   Ulcer of gastric fundus     Past Surgical History:  Procedure Laterality Date   CARDIAC CATHETERIZATION     CATARACT EXTRACTION W/PHACO Right 03/07/2021   Procedure: CATARACT EXTRACTION PHACO AND INTRAOCULAR LENS PLACEMENT ([place]) RIGHT 8.69 00:55.2;  Surgeon: Birder Robson, MD;  Location: [place];  Service: Ophthalmology;   Laterality: Right;   CORONARY ANGIOPLASTY     stents x 3   heart stint  11/04/2014   [facility]   HERNIA REPAIR     inguinal   INSERT / REPLACE / REMOVE PACEMAKER     JOINT REPLACEMENT     right knee   KNEE ARTHROPLASTY Right 02/08/2016   Procedure: COMPUTER ASSISTED TOTAL KNEE ARTHROPLASTY;  Surgeon: Dereck Leep, MD;  Location: [facility];  Service: Orthopedics;  Laterality: Right;   KNEE ARTHROPLASTY Left 06/27/2016   Procedure: COMPUTER ASSISTED TOTAL KNEE ARTHROPLASTY;  Surgeon: Dereck Leep, MD;  Location: [facility];  Service: Orthopedics;  Laterality: Left;   PACEMAKER INSERTION  08/2007   PACEMAKER INSERTION N/A 11/16/2015   Procedure: INSERTION PACEMAKER/ PACEMAKER CHANGE OUT;  Surgeon: Isaias Cowman, MD;  Location: [facility];  Service: Cardiovascular;  Laterality: N/A;   PERCUTANEOUS PLACEMENT INTRAVASCULAR STENT CERVICAL CAROTID ARTERY     twice, right carotid artery    Family History Father- colon polyps No other h/o GI disease or malignancy  Review of Systems  Constitutional:  Negative for activity change, appetite change, chills, fatigue, fever and unexpected weight change.  HENT:  Negative for trouble swallowing and voice change.   Respiratory:  Negative for shortness of breath.   Cardiovascular:  Negative for chest pain and palpitations.  Gastrointestinal:  Negative for abdominal distention, abdominal pain, anal bleeding, blood in  stool, constipation, diarrhea, nausea and vomiting.  Musculoskeletal:  Negative for arthralgias and myalgias.  Skin:  Negative for color change and pallor.  Neurological:  Negative for dizziness, syncope and weakness.  Psychiatric/Behavioral:  Negative for confusion. The patient is not nervous/anxious.   All other systems reviewed and are negative.   Medications No current facility-administered medications on file prior to encounter.   Current Outpatient Medications on File Prior to Encounter  Medication Sig Dispense Refill    amLODipine (NORVASC) 5 MG tablet Take 1 tablet (5 mg total) by mouth daily. 90 tablet 3   Aspirin 81 MG EC tablet Take 81 mg by mouth daily.     citalopram (CELEXA) 20 MG tablet TAKE 1 TABLET BY MOUTH EVERY DAY 90 tablet 2   clopidogrel (PLAVIX) 75 MG tablet TAKE 1 TABLET BY MOUTH EVERY DAY 90 tablet 1   fexofenadine (ALLEGRA) 180 MG tablet Take 180 mg by mouth daily. As needed     levothyroxine (SYNTHROID) 125 MCG tablet TAKE 1 TABLET (125 MCG TOTAL) BY MOUTH DAILY BEFORE BREAKFAST. 90 tablet 1   lisinopril (ZESTRIL) 40 MG tablet TAKE 1 TABLET BY MOUTH EVERY DAY 90 tablet 1   LORazepam (ATIVAN) 1 MG tablet TAKE 1 TABLET BY MOUTH EVERY DAY AT BEDTIME AS NEEDED 30 tablet 1   metoprolol tartrate (LOPRESSOR) 25 MG tablet TAKE 1 TABLET BY MOUTH TWICE A DAY 180 tablet 1   pantoprazole (PROTONIX) 40 MG tablet TAKE 1 TABLET BY MOUTH EVERY DAY 90 tablet 2   rosuvastatin (CRESTOR) 20 MG tablet TAKE 1 TABLET BY MOUTH EVERY DAY AT NIGHT 90 tablet 2   triamcinolone (NASACORT) 55 MCG/ACT AERO nasal inhaler Place into the nose.     triamcinolone cream (KENALOG) 0.1 % Apply 1 application topically 2 (two) times daily. 30 g 0   acetaminophen (TYLENOL) 500 MG tablet Take 1,000 mg by mouth every 6 (six) hours as needed.     nitroGLYCERIN (NITROSTAT) 0.4 MG SL tablet Place 0.4 mg under the tongue every 5 (five) minutes as needed. If a third tablet is needed please call 911.     Probiotic Product (ALIGN) 4 MG CAPS One tablet per day (Patient not taking: Reported on 07/06/2021) 30 capsule 0    Pertinent medications related to GI and procedure were reviewed by me with the patient prior to the procedure  No current facility-administered medications for this encounter.      Allergies  Allergen Reactions   Beta Adrenergic Blockers Other (See Comments)    Second Degree Heart Block   Lipitor [Atorvastatin] Other (See Comments)    Myalgia    Allergies were reviewed by me prior to the procedure  Objective     Vitals:   07/06/21 1153  BP: (!) 142/84  Pulse: 71  Resp: 18  Temp: (!) 97 F (36.1 C)  TempSrc: Temporal  SpO2: 97%  Weight: 82.7 kg  Height: 5\' 9"  (1.753 m)     Physical Exam Vitals and nursing note reviewed.  Constitutional:      General: He is not in acute distress.    Appearance: Normal appearance. He is not ill-appearing, toxic-appearing or diaphoretic.  HENT:     Head: Normocephalic and atraumatic.     Nose: Nose normal.     Mouth/Throat:     Mouth: Mucous membranes are moist.     Pharynx: Oropharynx is clear.  Eyes:     General: No scleral icterus.    Extraocular Movements: Extraocular movements intact.  Cardiovascular:     Rate and Rhythm: Normal rate and regular rhythm.     Heart sounds: Normal heart sounds. No murmur heard.   No friction rub. No gallop.     Comments: Ppm in place Pulmonary:     Effort: Pulmonary effort is normal. No respiratory distress.     Breath sounds: Normal breath sounds. No wheezing, rhonchi or rales.  Abdominal:     General: Bowel sounds are normal. There is no distension.     Palpations: Abdomen is soft.     Tenderness: There is no abdominal tenderness. There is no guarding or rebound.  Musculoskeletal:     Cervical back: Neck supple.     Right lower leg: No edema.     Left lower leg: No edema.  Skin:    General: Skin is warm and dry.     Coloration: Skin is not jaundiced or pale.  Neurological:     General: No focal deficit present.     Mental Status: He is alert and oriented to person, place, and time. Mental status is at baseline.  Psychiatric:        Mood and Affect: Mood normal.        Behavior: Behavior normal.        Thought Content: Thought content normal.        Judgment: Judgment normal.     Assessment:  Daniel Austin is a 80 y.o. male  who presents today for Colonoscopy for surveillance colonoscopy with personal h/o colon polyps.  Plan:  Colonoscopy with possible intervention today  Patient  has not stopped his plavix pre-procedure. Discussed increased risk of bleeding and that only small polyps would be removed, if large lesions present will need to repeat colonoscopy. Patient verbalized understanding.  Colonoscopy with possible biopsy, control of bleeding, polypectomy, and interventions as necessary has been discussed with the patient/patient representative. Informed consent was obtained from the patient/patient representative after explaining the indication, nature, and risks of the procedure including but not limited to death, bleeding, perforation, missed neoplasm/lesions, cardiorespiratory compromise, and reaction to medications. Opportunity for questions was given and appropriate answers were provided. Patient/patient representative has verbalized understanding is amenable to undergoing the procedure.   Annamaria Helling, DO  [facility]  Portions of the record may have been created with voice recognition software. Occasional wrong-word or 'sound-a-like' substitutions may have occurred due to the inherent limitations of voice recognition software.  Read the chart carefully and recognize, using context, where substitutions may have occurred.

## 2021-07-06 NOTE — Interval H&P Note (Signed)
History and Physical Interval Note: Preprocedure H&P from 07/06/21  was reviewed and there was no interval change after seeing and examining the patient.  Written consent was obtained from the patient after discussion of risks, benefits, and alternatives. Patient has consented to proceed with Colonoscopy with possible intervention   07/06/2021 12:13 PM  DOMANIK RAINVILLE  has presented today for surgery, with the diagnosis of hx of.  The various methods of treatment have been discussed with the patient and family. After consideration of risks, benefits and other options for treatment, the patient has consented to  Procedure(s): COLONOSCOPY (N/A) as a surgical intervention.  The patient's history has been reviewed, patient examined, no change in status, stable for surgery.  I have reviewed the patient's chart and labs.  Questions were answered to the patient's satisfaction.     Annamaria Helling

## 2021-07-06 NOTE — Anesthesia Preprocedure Evaluation (Signed)
Anesthesia Evaluation  Patient identified by MRN, date of birth, ID band Patient awake    Reviewed: Allergy & Precautions, H&P , NPO status , Patient's Chart, lab work & pertinent test results, reviewed documented beta blocker date and time   Airway Mallampati: II  TM Distance: >3 FB Neck ROM: full    Dental  (+) Poor Dentition, Missing   Pulmonary shortness of breath and with exertion,    Pulmonary exam normal breath sounds clear to auscultation       Cardiovascular Exercise Tolerance: Good hypertension, + CAD (s/p CABG, stents on Plavix), + Peripheral Vascular Disease (carotid stenosis) and +CHF  (-) dysrhythmias + pacemaker  Rhythm:regular Rate:Normal     Neuro/Psych PSYCHIATRIC DISORDERS Anxiety Depression CVA    GI/Hepatic Neg liver ROS, PUD, GERD  ,  Endo/Other  Hypothyroidism   Renal/GU negative Renal ROS  negative genitourinary   Musculoskeletal  (+) Arthritis , Osteoarthritis,    Abdominal   Peds  Hematology  (+) Blood dyscrasia, anemia ,   Anesthesia Other Findings Cardiology note 12/05/20:  Assessment   80 y.o. male with  1. 3-vessel coronary artery disease  2. Benign essential hypertension  3. Chronic systolic CHF (congestive heart failure) (CMS-HCC)  4. Atherosclerosis of native coronary artery of native heart without angina pectoris  5. H/O cardiac catheterization  6. Primary hypertension  7. Nonsustained ventricular tachycardia (CMS-HCC)  8. S/P cardiac catheterization  9. Hyperlipidemia, unspecified hyperlipidemia type  10. Occlusion and stenosis of unspecified carotid artery  11. Stokes-Adams syncope   80 year old gentleman with known coronary artery disease, status post DES mid LAD, currently without chest pain. Patient has essential hypertension, blood pressure well controlled on current BP medications with excellent home blood pressure recordings. Patient has hyperlipidemia with excellent  control of LDL cholesterol on Crestor. 2D echocardiogram 11/23/2016 revealed mildly reduced left ventricular function with LVEF 45% with mild mitral and tricuspid regurgitation, similar to prior study and 2016.  Plan   1. Continue current medications 2. Continue dual antiplatelet therapy 3. Counseled patient about low-sodium diet 4. DASH diet printed instructions given to the patient 5. Counseled patient about low-cholesterol diet 6. Continue Crestor for hyperlipidemia management  7. Low-fat and cholesterol diet printed instructions given to the patient 8. Pacemaker clinic to be scheduled 9. Encourage patient to lose weight 10. Return to clinic for follow-up in 6 months  No orders of the defined types were placed in this encounter.  Return in about 6 months (around 06/07/2021).   Reproductive/Obstetrics negative OB ROS                             Anesthesia Physical  Anesthesia Plan  ASA: 3  Anesthesia Plan: General   Post-op Pain Management:    Induction: Intravenous  PONV Risk Score and Plan: 2 and Propofol infusion and TIVA  Airway Management Planned: Natural Airway and Nasal Cannula  Additional Equipment:   Intra-op Plan:   Post-operative Plan:   Informed Consent: I have reviewed the patients History and Physical, chart, labs and discussed the procedure including the risks, benefits and alternatives for the proposed anesthesia with the patient or authorized representative who has indicated his/her understanding and acceptance.       Plan Discussed with: CRNA, Anesthesiologist and Surgeon  Anesthesia Plan Comments:         Anesthesia Quick Evaluation

## 2021-07-06 NOTE — Transfer of Care (Signed)
Immediate Anesthesia Transfer of Care Note  Patient: Daniel Austin  Procedure(s) Performed: COLONOSCOPY  Patient Location: PACU  Anesthesia Type:General  Level of Consciousness: sedated  Airway & Oxygen Therapy: Patient Spontanous Breathing and Patient connected to nasal cannula oxygen  Post-op Assessment: Report given to RN and Post -op Vital signs reviewed and stable  Post vital signs: Reviewed and stable  Last Vitals:  Vitals Value Taken Time  BP 97/70 07/06/21 1301  Temp 36.1 C 07/06/21 1301  Pulse 60 07/06/21 1303  Resp 17 07/06/21 1303  SpO2 99 % 07/06/21 1303  Vitals shown include unvalidated device data.  Last Pain:  Vitals:   07/06/21 1301  TempSrc: Temporal  PainSc: Asleep         Complications: No notable events documented.

## 2021-07-06 NOTE — Anesthesia Postprocedure Evaluation (Signed)
Anesthesia Post Note  Patient: Daniel Austin  Procedure(s) Performed: COLONOSCOPY  Patient location during evaluation: Phase II Anesthesia Type: General Level of consciousness: awake and alert, awake and oriented Pain management: pain level controlled Vital Signs Assessment: post-procedure vital signs reviewed and stable Respiratory status: spontaneous breathing, nonlabored ventilation and respiratory function stable Cardiovascular status: blood pressure returned to baseline and stable Postop Assessment: no apparent nausea or vomiting Anesthetic complications: no   No notable events documented.   Last Vitals:  Vitals:   07/06/21 1301 07/06/21 1321  BP: 97/70 (!) 126/58  Pulse:    Resp:    Temp: (!) 36.1 C   SpO2:      Last Pain:  Vitals:   07/06/21 1331  TempSrc:   PainSc: 0-No pain                 Phill Mutter

## 2021-07-07 ENCOUNTER — Encounter: Payer: Self-pay | Admitting: Gastroenterology

## 2021-07-07 LAB — SURGICAL PATHOLOGY

## 2021-07-12 DIAGNOSIS — I5022 Chronic systolic (congestive) heart failure: Secondary | ICD-10-CM | POA: Diagnosis not present

## 2021-07-15 ENCOUNTER — Other Ambulatory Visit: Payer: Self-pay | Admitting: Internal Medicine

## 2021-07-17 NOTE — Telephone Encounter (Signed)
Pt last seen on 06/21/2021 Ativan last filled on 05/17/2021

## 2021-07-18 ENCOUNTER — Ambulatory Visit: Payer: Medicare Other | Admitting: Internal Medicine

## 2021-07-20 ENCOUNTER — Other Ambulatory Visit: Payer: Self-pay

## 2021-07-20 ENCOUNTER — Ambulatory Visit: Payer: Medicare Other | Admitting: Podiatry

## 2021-07-20 ENCOUNTER — Encounter (INDEPENDENT_AMBULATORY_CARE_PROVIDER_SITE_OTHER): Payer: Self-pay

## 2021-07-20 ENCOUNTER — Encounter: Payer: Self-pay | Admitting: Podiatry

## 2021-07-20 DIAGNOSIS — D689 Coagulation defect, unspecified: Secondary | ICD-10-CM | POA: Diagnosis not present

## 2021-07-20 DIAGNOSIS — L97509 Non-pressure chronic ulcer of other part of unspecified foot with unspecified severity: Secondary | ICD-10-CM | POA: Insufficient documentation

## 2021-07-20 DIAGNOSIS — B351 Tinea unguium: Secondary | ICD-10-CM

## 2021-07-20 DIAGNOSIS — L97521 Non-pressure chronic ulcer of other part of left foot limited to breakdown of skin: Secondary | ICD-10-CM | POA: Diagnosis not present

## 2021-07-20 NOTE — Progress Notes (Signed)
This patient returns to my office for at risk foot care.  This patient requires this care by a professional since this patient will be at risk due to having coagulation defect due to plavix.   This patient is unable to cut nails himself since the patient cannot reach his nails.These nails are painful walking and wearing shoes.  This patient presents for at risk foot care today.  General Appearance  Alert, conversant and in no acute stress.  Vascular  Dorsalis pedis and posterior tibial  pulses are palpable  bilaterally.  Capillary return is within normal limits  bilaterally. Temperature is within normal limits  bilaterally.  Neurologic  Senn-Weinstein monofilament wire test within normal limits  bilaterally. Muscle power within normal limits bilaterally.  Nails Thick disfigured discolored nails with subungual debris  from hallux to fifth toes bilaterally. No evidence of bacterial infection or drainage bilaterally.  Orthopedic  No limitations of motion  feet .  No crepitus or effusions noted.  No bony pathology or digital deformities noted.  HAV  B/L.  Skin  normotropic skin with no porokeratosis noted bilaterally.  No signs of infections or ulcers noted.  Asymptomatic callus 1st MPJ  Right foot. Red inflamed ulcer medial aspect fifth toe left foot.  Onychomycosis  Pain in right toes  Pain in left toes  Consent was obtained for treatment procedures.   Mechanical debridement of nails 1-5  bilaterally performed with a nail nipper.  Filed with dremel without incident.  During nail treatment it was noted he has open wound on the inside fifth toe left foot.   Told him to soak his toe.  If the condition worsens he was told to return to the office.  I am allowing this lesion to declare itself.  Neosporin/DSD applied.      Return office visit   3 months                   Told patient to return for periodic foot care and evaluation due to potential at risk complications.   Gardiner Barefoot DPM

## 2021-07-29 ENCOUNTER — Other Ambulatory Visit: Payer: Self-pay | Admitting: Internal Medicine

## 2021-08-05 ENCOUNTER — Other Ambulatory Visit: Payer: Self-pay | Admitting: Internal Medicine

## 2021-08-20 ENCOUNTER — Other Ambulatory Visit: Payer: Self-pay | Admitting: Internal Medicine

## 2021-08-21 NOTE — Telephone Encounter (Signed)
Rx ok'd for lorazepam #30 with one refill.

## 2021-08-21 NOTE — Telephone Encounter (Signed)
Last OV 06/21/21 last refill 07/18/21 okay to fill?

## 2021-08-26 ENCOUNTER — Other Ambulatory Visit: Payer: Self-pay | Admitting: Internal Medicine

## 2021-09-19 ENCOUNTER — Other Ambulatory Visit: Payer: Self-pay | Admitting: Internal Medicine

## 2021-10-23 ENCOUNTER — Other Ambulatory Visit: Payer: Self-pay | Admitting: Internal Medicine

## 2021-10-24 ENCOUNTER — Telehealth: Payer: Self-pay | Admitting: Internal Medicine

## 2021-10-24 NOTE — Telephone Encounter (Signed)
Needs f/u appt scheduled.  Thanks.

## 2021-10-30 ENCOUNTER — Telehealth: Payer: Self-pay | Admitting: Internal Medicine

## 2021-10-30 NOTE — Telephone Encounter (Signed)
Lm to call office to set up a follow up/ per Dr Nicki Reaper.

## 2021-11-02 ENCOUNTER — Ambulatory Visit: Payer: Medicare Other | Admitting: Podiatry

## 2021-12-11 ENCOUNTER — Other Ambulatory Visit: Payer: Self-pay

## 2021-12-11 ENCOUNTER — Telehealth: Payer: Self-pay | Admitting: Internal Medicine

## 2021-12-11 MED ORDER — LISINOPRIL 40 MG PO TABS: 40.0000 mg | ORAL_TABLET | Freq: Every day | ORAL | 1 refills | Status: DC

## 2021-12-11 NOTE — Telephone Encounter (Signed)
Medication sent in. 

## 2021-12-11 NOTE — Telephone Encounter (Signed)
Pt need refill on lisinopril sent to [facility] [place] ?

## 2021-12-20 ENCOUNTER — Other Ambulatory Visit: Payer: Self-pay | Admitting: Internal Medicine

## 2021-12-20 NOTE — Telephone Encounter (Signed)
Rx ok'd for lorazepam.  Please schedule him a f/u appt.  Would like to discuss medication and f/u his other medical issues.  ?

## 2022-01-19 ENCOUNTER — Other Ambulatory Visit: Payer: Self-pay | Admitting: Internal Medicine

## 2022-01-23 NOTE — Telephone Encounter (Signed)
Rx ok'd for lorazepam #30 with no refills.  Rx sent in.  Needs a f/u appt with me.  Overdue.  ?

## 2022-02-24 ENCOUNTER — Other Ambulatory Visit: Payer: Self-pay | Admitting: Internal Medicine

## 2022-02-27 NOTE — Telephone Encounter (Signed)
Rx ok'd for lorazepam x1.  Needs a f/u appt scheduled.  Note sent to front to schedule.

## 2022-03-19 ENCOUNTER — Other Ambulatory Visit: Payer: Self-pay | Admitting: Internal Medicine

## 2022-04-09 ENCOUNTER — Other Ambulatory Visit: Payer: Self-pay | Admitting: Internal Medicine

## 2022-04-18 ENCOUNTER — Other Ambulatory Visit: Payer: Self-pay | Admitting: Internal Medicine

## 2022-04-18 NOTE — Telephone Encounter (Signed)
Rx sent in for lorazepam.  He needs a f/u appt.  Is overdue.

## 2022-05-01 ENCOUNTER — Telehealth: Payer: Self-pay | Admitting: Internal Medicine

## 2022-05-01 NOTE — Telephone Encounter (Signed)
Copied from [place] [phone]. Topic: Medicare AWV >> May 01, 2022 11:06 AM Devoria Glassing wrote: Reason for CRM: Left message for patient to schedule Annual Wellness Visit.  Please schedule with Nurse Health Advisor Denisa O'Brien-Blaney, LPN at [facility]. This appt can be telephone or office visit.  Please call [phone] ask for [facility]

## 2022-05-03 ENCOUNTER — Other Ambulatory Visit: Payer: Self-pay | Admitting: Internal Medicine

## 2022-05-04 ENCOUNTER — Other Ambulatory Visit: Payer: Self-pay | Admitting: Internal Medicine

## 2022-05-22 ENCOUNTER — Encounter (INDEPENDENT_AMBULATORY_CARE_PROVIDER_SITE_OTHER): Payer: Medicare Other

## 2022-05-22 ENCOUNTER — Ambulatory Visit (INDEPENDENT_AMBULATORY_CARE_PROVIDER_SITE_OTHER): Payer: Medicare Other | Admitting: Nurse Practitioner

## 2022-05-30 ENCOUNTER — Telehealth: Payer: Self-pay | Admitting: Internal Medicine

## 2022-05-30 NOTE — Telephone Encounter (Signed)
Copied from [place] [phone]. Topic: Medicare AWV >> May 30, 2022 10:06 AM Devoria Glassing wrote: Reason for CRM: Left message for patient to schedule Annual Wellness Visit.  Please schedule with Nurse Health Advisor Denisa O'Brien-Blaney, LPN at [facility]. This appt can be telephone or office visit.  Please call [phone] ask for [facility]

## 2022-05-31 ENCOUNTER — Other Ambulatory Visit: Payer: Self-pay | Admitting: Internal Medicine

## 2022-06-01 ENCOUNTER — Telehealth: Payer: Self-pay

## 2022-06-01 NOTE — Telephone Encounter (Signed)
Patient states he needs a refill of his LORazepam (ATIVAN) 1 MG tablet.  *Patient states [facility] [place].

## 2022-06-01 NOTE — Telephone Encounter (Signed)
Please call him and let him know that I can refill the medication, but given has been a while since I have seen him - needs a f/u appt.  Please schedule him for Tuesday 06/05/22 am.

## 2022-06-01 NOTE — Telephone Encounter (Signed)
Lvm for pt to return call in regards to his Lorazepam RF. See note.

## 2022-06-04 NOTE — Telephone Encounter (Signed)
Called pt x2 lvm x2 for pt to return call in regards to his Lorazepam RF. See note.

## 2022-06-05 NOTE — Telephone Encounter (Signed)
Please call his daughter Cephus Richer.  (She is also my pt and is on his DPR)

## 2022-06-05 NOTE — Telephone Encounter (Signed)
Called pt's daughter Loma Sousa to disc pt's Lorazepam RF. See note. Unable to reach nor lvm, phone just kept ringing.

## 2022-06-08 ENCOUNTER — Other Ambulatory Visit: Payer: Self-pay | Admitting: Internal Medicine

## 2022-06-12 NOTE — Telephone Encounter (Signed)
Last OV 06/21/21 tried to call patient to schedule follow up no answer left message on multiple voicemails to call  office please advise to refill.

## 2022-06-13 NOTE — Telephone Encounter (Signed)
I refilled his plavix.  He is overdue f/u with me.  Needs to schedule an appt.  If unable to reach pt, please contact emergency contact.

## 2022-06-14 ENCOUNTER — Telehealth: Payer: Self-pay

## 2022-06-14 MED ORDER — LORAZEPAM 1 MG PO TABS: ORAL_TABLET | ORAL | 0 refills | Status: DC

## 2022-06-14 NOTE — Telephone Encounter (Signed)
Patient states he needs a refill for his LORazepam (ATIVAN) 1 MG tablet.  Patient states he is out of this medication.  *[facility] [place] (patient states he just switched to this pharmacy)

## 2022-06-14 NOTE — Telephone Encounter (Signed)
Rx sent in for lorazepam.  Needs an ppt before any further refills.  Please schedule.

## 2022-06-15 NOTE — Telephone Encounter (Signed)
Appointment made for 06/27/22

## 2022-06-15 NOTE — Telephone Encounter (Signed)
Patient advised and appt made for 06/27/22

## 2022-06-27 ENCOUNTER — Ambulatory Visit (INDEPENDENT_AMBULATORY_CARE_PROVIDER_SITE_OTHER): Payer: Medicare Other | Admitting: Internal Medicine

## 2022-06-27 DIAGNOSIS — I10 Essential (primary) hypertension: Secondary | ICD-10-CM

## 2022-06-28 ENCOUNTER — Encounter: Payer: Self-pay | Admitting: Internal Medicine

## 2022-06-28 NOTE — Progress Notes (Signed)
Appt was canceled (changed).  Pt was not seen.

## 2022-07-03 ENCOUNTER — Telehealth: Payer: Self-pay | Admitting: Internal Medicine

## 2022-07-03 NOTE — Telephone Encounter (Signed)
Copied from [place] [id number]. Topic: Medicare AWV >> Jul 03, 2022 11:20 AM Devoria Glassing wrote: Reason for CRM: Left message for patient to schedule Annual Wellness Visit.  Please schedule with Nurse Health Advisor Denisa O'Brien-Blaney, LPN at [facility]. This appt can be telephone or office visit.  Please call [phone] ask for [facility]

## 2022-07-11 ENCOUNTER — Other Ambulatory Visit: Payer: Self-pay | Admitting: Internal Medicine

## 2022-07-30 ENCOUNTER — Other Ambulatory Visit: Payer: Self-pay | Admitting: Internal Medicine

## 2022-07-31 NOTE — Telephone Encounter (Signed)
Lm for pt daughter Loma Sousa re: pt needs appointment

## 2022-07-31 NOTE — Telephone Encounter (Signed)
Rx ok'd for lorazepam #30 with no refills.  Needs f/u appt.  Can schedule this Friday.  I think I have an 11:00 open.

## 2022-08-03 ENCOUNTER — Ambulatory Visit (INDEPENDENT_AMBULATORY_CARE_PROVIDER_SITE_OTHER): Payer: Medicare Other | Admitting: Internal Medicine

## 2022-08-03 DIAGNOSIS — R739 Hyperglycemia, unspecified: Secondary | ICD-10-CM

## 2022-08-03 DIAGNOSIS — Z125 Encounter for screening for malignant neoplasm of prostate: Secondary | ICD-10-CM

## 2022-08-03 DIAGNOSIS — E039 Hypothyroidism, unspecified: Secondary | ICD-10-CM

## 2022-08-03 DIAGNOSIS — I10 Essential (primary) hypertension: Secondary | ICD-10-CM

## 2022-08-03 DIAGNOSIS — E785 Hyperlipidemia, unspecified: Secondary | ICD-10-CM

## 2022-08-03 NOTE — Progress Notes (Deleted)
Patient ID: Daniel Austin, male   DOB: [birth year], 81 y.o.   MRN: [id number]   Subjective:    Patient ID: Daniel Austin, male    DOB: 02-09-1941, 81 y.o.   MRN: [id number]   Patient here for No chief complaint on file.  Marland Kitchen   HPI Here to follow up regarding blood pressure and cholesterol.  Last evaluated 06/21/21.    Past Medical History:  Diagnosis Date   Anemia    Anxiety    Arthritis    CAD (coronary artery disease)    Cardiomyopathy ([place])    Carotid artery stenosis    high-grade symptomatic right sided s/p stent twice   CHF (congestive heart failure) (HCC)    chronic movement fluid and history of CHF he noticed in the    Depression    Fractures, multiple    Metal rod in left foot   GERD (gastroesophageal reflux disease)    Heart block    Hypercholesterolemia    Hyperglycemia    Hypertension    Hypothyroidism    Knee pain    Presence of permanent cardiac pacemaker    11/16/2015 Medtronic ADR01,   (2008 St Jude)   Shortness of breath dyspnea    Stroke [facility])    clot in neck broke off left side weakness 2008  resolved   Syncope    second degree heart block s/p St Jude's pacemaker placement 12/08 with his history is limited with any   Ulcer of gastric fundus    Past Surgical History:  Procedure Laterality Date   CARDIAC CATHETERIZATION     CATARACT EXTRACTION W/PHACO Right 03/07/2021   Procedure: CATARACT EXTRACTION PHACO AND INTRAOCULAR LENS PLACEMENT ([place]) RIGHT 8.69 00:55.2;  Surgeon: Birder Robson, MD;  Location: [place];  Service: Ophthalmology;  Laterality: Right;   COLONOSCOPY N/A 07/06/2021   Procedure: COLONOSCOPY;  Surgeon: Annamaria Helling, DO;  Location: [facility] ENDOSCOPY;  Service: Gastroenterology;  Laterality: N/A;   CORONARY ANGIOPLASTY     stents x 3   heart stint  11/04/2014   [facility]   HERNIA REPAIR     inguinal   INSERT / REPLACE / REMOVE PACEMAKER     JOINT REPLACEMENT     right knee   KNEE ARTHROPLASTY Right  02/08/2016   Procedure: COMPUTER ASSISTED TOTAL KNEE ARTHROPLASTY;  Surgeon: Dereck Leep, MD;  Location: [facility];  Service: Orthopedics;  Laterality: Right;   KNEE ARTHROPLASTY Left 06/27/2016   Procedure: COMPUTER ASSISTED TOTAL KNEE ARTHROPLASTY;  Surgeon: Dereck Leep, MD;  Location: [facility];  Service: Orthopedics;  Laterality: Left;   PACEMAKER INSERTION  08/2007   PACEMAKER INSERTION N/A 11/16/2015   Procedure: INSERTION PACEMAKER/ PACEMAKER CHANGE OUT;  Surgeon: Isaias Cowman, MD;  Location: [facility];  Service: Cardiovascular;  Laterality: N/A;   PERCUTANEOUS PLACEMENT INTRAVASCULAR STENT CERVICAL CAROTID ARTERY     twice, right carotid artery   Family History  Problem Relation Age of Onset   Heart disease Mother        CHF   Heart disease Father        CHF   Thyroid disease Father        hypothyroidism   Colon polyps Father    Diabetes Sister        diet controlled   Thyroid disease Sister    Cancer Sister        breast    Thyroid disease Sister    Cancer Sister  breast   Colon cancer Neg Hx    Prostate cancer Neg Hx    Social History   Socioeconomic History   Marital status: Married    Spouse name: Not on file   Number of children: 1   Years of education: Not on file   Highest education level: Not on file  Occupational History   Not on file  Tobacco Use   Smoking status: Never   Smokeless tobacco: Never  Vaping Use   Vaping Use: Never used  Substance and Sexual Activity   Alcohol use: No    Alcohol/week: 0.0 standard drinks of alcohol   Drug use: No   Sexual activity: Not Currently  Other Topics Concern   Not on file  Social History Narrative   Not on file   Social Determinants of Health   Financial Resource Strain: Low Risk  (09/12/2017)   Overall Financial Resource Strain (CARDIA)    Difficulty of Paying Living Expenses: Not hard at all  Food Insecurity: No Food Insecurity (09/12/2017)   Hunger Vital Sign    Worried About Running  Out of Food in the Last Year: Never true    Ran Out of Food in the Last Year: Never true  Transportation Needs: No Transportation Needs (09/12/2017)   PRAPARE - Hydrologist (Medical): No    Lack of Transportation (Non-Medical): No  Physical Activity: Unknown (09/12/2017)   Exercise Vital Sign    Days of Exercise per Week: 0 days    Minutes of Exercise per Session: Not on file  Stress: No Stress Concern Present (09/12/2017)   [place]    Feeling of Stress : Not at all  Social Connections: Unknown (09/12/2017)   Social Connection and Isolation Panel [NHANES]    Frequency of Communication with Friends and Family: Patient refused    Frequency of Social Gatherings with Friends and Family: Patient refused    Attends Religious Services: Patient refused    Active Member of Clubs or Organizations: Patient refused    Attends Archivist Meetings: Patient refused    Marital Status: Patient refused     Review of Systems     Objective:     There were no vitals taken for this visit. Wt Readings from Last 3 Encounters:  07/06/21 182 lb 5.8 oz (82.7 kg)  06/21/21 191 lb 9.6 oz (86.9 kg)  06/12/21 199 lb 15.3 oz (90.7 kg)    Physical Exam   Outpatient Encounter Medications as of 08/03/2022  Medication Sig   acetaminophen (TYLENOL) 500 MG tablet Take 1,000 mg by mouth every 6 (six) hours as needed.   amLODipine (NORVASC) 5 MG tablet TAKE 1 TABLET (5 MG TOTAL) BY MOUTH DAILY.   Aspirin 81 MG EC tablet Take 81 mg by mouth daily.   citalopram (CELEXA) 20 MG tablet TAKE 1 TABLET BY MOUTH EVERY DAY   clopidogrel (PLAVIX) 75 MG tablet TAKE 1 TABLET BY MOUTH EVERY DAY   fexofenadine (ALLEGRA) 180 MG tablet Take 180 mg by mouth daily. As needed   levothyroxine (SYNTHROID) 125 MCG tablet TAKE 1 TABLET BY MOUTH EVERY DAY BEFORE BREAKFAST   lisinopril (ZESTRIL) 40 MG tablet TAKE 1 TABLET BY  MOUTH EVERY DAY   LORazepam (ATIVAN) 1 MG tablet 1/2-1 TABLET AT BEDTIME AS NEEDED . NEEDS F/U APPT.   metoprolol tartrate (LOPRESSOR) 25 MG tablet TAKE 1 TABLET BY MOUTH TWICE A DAY   nitroGLYCERIN (NITROSTAT)  0.4 MG SL tablet Place 0.4 mg under the tongue every 5 (five) minutes as needed. If a third tablet is needed please call 911.   pantoprazole (PROTONIX) 40 MG tablet TAKE 1 TABLET BY MOUTH EVERY DAY   rosuvastatin (CRESTOR) 20 MG tablet TAKE 1 TABLET BY MOUTH EVERY DAY AT NIGHT   triamcinolone (NASACORT) 55 MCG/ACT AERO nasal inhaler Place into the nose.   triamcinolone cream (KENALOG) 0.1 % Apply 1 application topically 2 (two) times daily.   No facility-administered encounter medications on file as of 08/03/2022.     Lab Results  Component Value Date   WBC 6.3 06/12/2021   HGB 12.4 (L) 06/12/2021   HCT 36.5 (L) 06/12/2021   PLT 223 06/12/2021   GLUCOSE 140 (H) 06/12/2021   CHOL 137 03/14/2021   TRIG 123.0 03/14/2021   HDL 54.70 03/14/2021   LDLCALC 58 03/14/2021   ALT 11 06/12/2021   AST 21 06/12/2021   NA 132 (L) 06/12/2021   K 3.5 06/12/2021   CL 101 06/12/2021   CREATININE 0.96 06/12/2021   BUN 8 06/12/2021   CO2 22 06/12/2021   TSH 0.40 03/14/2021   PSA 0.37 03/14/2021   INR 1.13 06/11/2016   HGBA1C 6.1 03/14/2021   MICROALBUR 1.6 05/25/2014       Assessment & Plan:   Problem List Items Addressed This Visit   None    Einar Pheasant, MD

## 2022-08-04 ENCOUNTER — Encounter: Payer: Self-pay | Admitting: Internal Medicine

## 2022-08-04 NOTE — Progress Notes (Signed)
Patient ID: Daniel Austin, male   DOB: [birth year], 81 y.o.   MRN: [id number] Did not show for appt.

## 2022-08-27 ENCOUNTER — Other Ambulatory Visit: Payer: Self-pay | Admitting: Internal Medicine

## 2022-08-27 ENCOUNTER — Encounter: Payer: Self-pay | Admitting: Internal Medicine

## 2022-08-27 ENCOUNTER — Ambulatory Visit (INDEPENDENT_AMBULATORY_CARE_PROVIDER_SITE_OTHER): Payer: Medicare Other | Admitting: Internal Medicine

## 2022-08-27 VITALS — BP 130/72 | HR 73 | Temp 98.0°F | Resp 16 | Ht 69.0 in | Wt 181.4 lb

## 2022-08-27 DIAGNOSIS — E039 Hypothyroidism, unspecified: Secondary | ICD-10-CM | POA: Diagnosis not present

## 2022-08-27 DIAGNOSIS — E871 Hypo-osmolality and hyponatremia: Secondary | ICD-10-CM

## 2022-08-27 DIAGNOSIS — I10 Essential (primary) hypertension: Secondary | ICD-10-CM | POA: Diagnosis not present

## 2022-08-27 DIAGNOSIS — R739 Hyperglycemia, unspecified: Secondary | ICD-10-CM | POA: Diagnosis not present

## 2022-08-27 DIAGNOSIS — D649 Anemia, unspecified: Secondary | ICD-10-CM | POA: Diagnosis not present

## 2022-08-27 DIAGNOSIS — E785 Hyperlipidemia, unspecified: Secondary | ICD-10-CM

## 2022-08-27 DIAGNOSIS — Z125 Encounter for screening for malignant neoplasm of prostate: Secondary | ICD-10-CM

## 2022-08-27 DIAGNOSIS — Z96651 Presence of right artificial knee joint: Secondary | ICD-10-CM

## 2022-08-27 DIAGNOSIS — F439 Reaction to severe stress, unspecified: Secondary | ICD-10-CM

## 2022-08-27 DIAGNOSIS — I5022 Chronic systolic (congestive) heart failure: Secondary | ICD-10-CM | POA: Diagnosis not present

## 2022-08-27 DIAGNOSIS — I6523 Occlusion and stenosis of bilateral carotid arteries: Secondary | ICD-10-CM

## 2022-08-27 DIAGNOSIS — R413 Other amnesia: Secondary | ICD-10-CM | POA: Diagnosis not present

## 2022-08-27 DIAGNOSIS — I2510 Atherosclerotic heart disease of native coronary artery without angina pectoris: Secondary | ICD-10-CM | POA: Diagnosis not present

## 2022-08-27 LAB — CBC WITH DIFFERENTIAL/PLATELET
Basophils Absolute: 0.1 10*3/uL (ref 0.0–0.1)
Basophils Relative: 1.1 % (ref 0.0–3.0)
Eosinophils Absolute: 0.4 10*3/uL (ref 0.0–0.7)
Eosinophils Relative: 7.2 % — ABNORMAL HIGH (ref 0.0–5.0)
HCT: 33.8 % — ABNORMAL LOW (ref 39.0–52.0)
Hemoglobin: 11.6 g/dL — ABNORMAL LOW (ref 13.0–17.0)
Lymphocytes Relative: 25.9 % (ref 12.0–46.0)
Lymphs Abs: 1.4 10*3/uL (ref 0.7–4.0)
MCHC: 34.5 g/dL (ref 30.0–36.0)
MCV: 89.9 fl (ref 78.0–100.0)
Monocytes Absolute: 0.7 10*3/uL (ref 0.1–1.0)
Monocytes Relative: 12.7 % — ABNORMAL HIGH (ref 3.0–12.0)
Neutro Abs: 2.9 10*3/uL (ref 1.4–7.7)
Neutrophils Relative %: 53.1 % (ref 43.0–77.0)
Platelets: 232 10*3/uL (ref 150.0–400.0)
RBC: 3.75 Mil/uL — ABNORMAL LOW (ref 4.22–5.81)
RDW: 14 % (ref 11.5–15.5)
WBC: 5.5 10*3/uL (ref 4.0–10.5)

## 2022-08-27 LAB — BASIC METABOLIC PANEL
BUN: 13 mg/dL (ref 6–23)
CO2: 25 mEq/L (ref 19–32)
Calcium: 9.2 mg/dL (ref 8.4–10.5)
Chloride: 93 mEq/L — ABNORMAL LOW (ref 96–112)
Creatinine, Ser: 0.9 mg/dL (ref 0.40–1.50)
GFR: 79.67 mL/min (ref >60.00)
Glucose, Bld: 108 mg/dL — ABNORMAL HIGH (ref 70–99)
Potassium: 4.6 mEq/L (ref 3.5–5.1)
Sodium: 125 mEq/L — ABNORMAL LOW (ref 135–145)

## 2022-08-27 LAB — PSA, MEDICARE: PSA: 0.32 ng/ml (ref 0.10–4.00)

## 2022-08-27 LAB — TSH: TSH: 5.1 u[IU]/mL (ref 0.35–5.50)

## 2022-08-27 LAB — VITAMIN B12: Vitamin B-12: 343 pg/mL (ref 211–911)

## 2022-08-27 NOTE — Progress Notes (Unsigned)
Patient ID: Daniel Austin, male   DOB: March 31, 1941, 81 y.o.   MRN: [id number]   Subjective:    Patient ID: Daniel Austin, male    DOB: 1941/04/13, 81 y.o.   MRN: [id number]   Patient here for  Chief Complaint  Patient presents with   Follow-up   Hypertension   .   HPI Here to follow up regarding his blood pressure, blood sugar and cholesterol.  Last seen 05/2021.  Saw cardiology 06/19/21 - stable.  He is accompanied by his wife.  History obtained from both of them.  Reports he has noticed that he is more short tempered.  Reports will get upset with wife easily.  No specific triggers.  Uses a cane to walk around.  No falls.  Still tries to stay active.  No chest pain or sob reported.  No increased cough or congestion.  No abdominal pain.  Bowels moving.  Also reports some memory change.     Past Medical History:  Diagnosis Date   Anemia    Anxiety    Arthritis    CAD (coronary artery disease)    Cardiomyopathy ([place])    Carotid artery stenosis    high-grade symptomatic right sided s/p stent twice   CHF (congestive heart failure) (HCC)    chronic movement fluid and history of CHF he noticed in the    Depression    Fractures, multiple    Metal rod in left foot   GERD (gastroesophageal reflux disease)    Heart block    Hypercholesterolemia    Hyperglycemia    Hypertension    Hypothyroidism    Knee pain    Presence of permanent cardiac pacemaker    11/16/2015 Medtronic ADR01,   (2008 St Jude)   Shortness of breath dyspnea    Stroke [facility])    clot in neck broke off left side weakness 2008  resolved   Syncope    second degree heart block s/p St Jude's pacemaker placement 12/08 with his history is limited with any   Ulcer of gastric fundus    Past Surgical History:  Procedure Laterality Date   CARDIAC CATHETERIZATION     CATARACT EXTRACTION W/PHACO Right 03/07/2021   Procedure: CATARACT EXTRACTION PHACO AND INTRAOCULAR LENS PLACEMENT ([place]) RIGHT 8.69 00:55.2;  Surgeon:  Birder Robson, MD;  Location: [facility];  Service: Ophthalmology;  Laterality: Right;   COLONOSCOPY N/A 07/06/2021   Procedure: COLONOSCOPY;  Surgeon: Annamaria Helling, DO;  Location: [facility] ENDOSCOPY;  Service: Gastroenterology;  Laterality: N/A;   CORONARY ANGIOPLASTY     stents x 3   heart stint  11/04/2014   [facility]   HERNIA REPAIR     inguinal   INSERT / REPLACE / REMOVE PACEMAKER     JOINT REPLACEMENT     right knee   KNEE ARTHROPLASTY Right 02/08/2016   Procedure: COMPUTER ASSISTED TOTAL KNEE ARTHROPLASTY;  Surgeon: Dereck Leep, MD;  Location: [facility] ORS;  Service: Orthopedics;  Laterality: Right;   KNEE ARTHROPLASTY Left 06/27/2016   Procedure: COMPUTER ASSISTED TOTAL KNEE ARTHROPLASTY;  Surgeon: Dereck Leep, MD;  Location: [facility] ORS;  Service: Orthopedics;  Laterality: Left;   PACEMAKER INSERTION  08/2007   PACEMAKER INSERTION N/A 11/16/2015   Procedure: INSERTION PACEMAKER/ PACEMAKER CHANGE OUT;  Surgeon: Isaias Cowman, MD;  Location: [facility] ORS;  Service: Cardiovascular;  Laterality: N/A;   PERCUTANEOUS PLACEMENT INTRAVASCULAR STENT CERVICAL CAROTID ARTERY     twice, right carotid artery   Family  History  Problem Relation Age of Onset   Heart disease Mother        CHF   Heart disease Father        CHF   Thyroid disease Father        hypothyroidism   Colon polyps Father    Diabetes Sister        diet controlled   Thyroid disease Sister    Cancer Sister        breast    Thyroid disease Sister    Cancer Sister        breast   Colon cancer Neg Hx    Prostate cancer Neg Hx    Social History   Socioeconomic History   Marital status: Married    Spouse name: Not on file   Number of children: 1   Years of education: Not on file   Highest education level: Not on file  Occupational History   Not on file  Tobacco Use   Smoking status: Never   Smokeless tobacco: Never  Vaping Use   Vaping Use: Never used  Substance and Sexual Activity    Alcohol use: No    Alcohol/week: 0.0 standard drinks of alcohol   Drug use: No   Sexual activity: Not Currently  Other Topics Concern   Not on file  Social History Narrative   Not on file   Social Determinants of Health   Financial Resource Strain: Low Risk  (09/12/2017)   Overall Financial Resource Strain (CARDIA)    Difficulty of Paying Living Expenses: Not hard at all  Food Insecurity: No Food Insecurity (09/12/2017)   Hunger Vital Sign    Worried About Running Out of Food in the Last Year: Never true    Ran Out of Food in the Last Year: Never true  Transportation Needs: No Transportation Needs (09/12/2017)   PRAPARE - Hydrologist (Medical): No    Lack of Transportation (Non-Medical): No  Physical Activity: Unknown (09/12/2017)   Exercise Vital Sign    Days of Exercise per Week: 0 days    Minutes of Exercise per Session: Not on file  Stress: No Stress Concern Present (09/12/2017)   [place]    Feeling of Stress : Not at all  Social Connections: Unknown (09/12/2017)   Social Connection and Isolation Panel [NHANES]    Frequency of Communication with Friends and Family: Patient refused    Frequency of Social Gatherings with Friends and Family: Patient refused    Attends Religious Services: Patient refused    Marine scientist or Organizations: Patient refused    Attends Archivist Meetings: Patient refused    Marital Status: Patient refused     Review of Systems  Constitutional:  Negative for appetite change and unexpected weight change.  HENT:  Negative for congestion and sinus pressure.   Respiratory:  Negative for cough, chest tightness and shortness of breath.   Cardiovascular:  Negative for chest pain, palpitations and leg swelling.  Gastrointestinal:  Negative for abdominal pain, diarrhea, nausea and vomiting.  Genitourinary:  Negative for difficulty  urinating and dysuria.  Musculoskeletal:  Negative for joint swelling and myalgias.  Skin:  Negative for color change and rash.  Neurological:  Negative for dizziness and headaches.  Psychiatric/Behavioral:         Describes being short tempered.  Feels he cannot control his temper as well.  Some memory issues.  Hard to give specific examples.        Objective:     BP 130/72   Pulse 73   Temp 98 F (36.7 C) (Temporal)   Resp 16   Ht _0  (1.753 m)   Wt 181 lb 6.4 oz (82.3 kg)   SpO2 98%   BMI 26.79 kg/m  Wt Readings from Last 3 Encounters:  08/27/22 181 lb 6.4 oz (82.3 kg)  07/06/21 182 lb 5.8 oz (82.7 kg)  06/21/21 191 lb 9.6 oz (86.9 kg)    Physical Exam Vitals reviewed.  Constitutional:      General: He is not in acute distress.    Appearance: Normal appearance. He is well-developed.  HENT:     Head: Normocephalic and atraumatic.     Right Ear: External ear normal.     Left Ear: External ear normal.  Eyes:     General: No scleral icterus.       Right eye: No discharge.        Left eye: No discharge.     Conjunctiva/sclera: Conjunctivae normal.  Cardiovascular:     Rate and Rhythm: Normal rate and regular rhythm.  Pulmonary:     Effort: Pulmonary effort is normal. No respiratory distress.     Breath sounds: Normal breath sounds.  Abdominal:     General: Bowel sounds are normal.     Palpations: Abdomen is soft.     Tenderness: There is no abdominal tenderness.  Musculoskeletal:        General: No swelling or tenderness.     Cervical back: Neck supple. No tenderness.  Lymphadenopathy:     Cervical: No cervical adenopathy.  Skin:    Findings: No erythema or rash.  Neurological:     Mental Status: He is alert.  Psychiatric:        Mood and Affect: Mood normal.        Behavior: Behavior normal.      Outpatient Encounter Medications as of 08/27/2022  Medication Sig   acetaminophen (TYLENOL) 500 MG tablet Take 1,000 mg by mouth every 6 (six) hours as  needed.   amLODipine (NORVASC) 5 MG tablet TAKE 1 TABLET (5 MG TOTAL) BY MOUTH DAILY.   Aspirin 81 MG EC tablet Take 81 mg by mouth daily.   citalopram (CELEXA) 20 MG tablet TAKE 1 TABLET BY MOUTH EVERY DAY   clopidogrel (PLAVIX) 75 MG tablet TAKE 1 TABLET BY MOUTH EVERY DAY   fexofenadine (ALLEGRA) 180 MG tablet Take 180 mg by mouth daily. As needed   levothyroxine (SYNTHROID) 125 MCG tablet TAKE 1 TABLET BY MOUTH EVERY DAY BEFORE BREAKFAST   lisinopril (ZESTRIL) 40 MG tablet TAKE 1 TABLET BY MOUTH EVERY DAY   LORazepam (ATIVAN) 1 MG tablet 1/2-1 TABLET AT BEDTIME AS NEEDED . NEEDS F/U APPT.   nitroGLYCERIN (NITROSTAT) 0.4 MG SL tablet Place 0.4 mg under the tongue every 5 (five) minutes as needed. If a third tablet is needed please call 911.   pantoprazole (PROTONIX) 40 MG tablet TAKE 1 TABLET BY MOUTH EVERY DAY   rosuvastatin (CRESTOR) 20 MG tablet TAKE 1 TABLET BY MOUTH EVERY DAY AT NIGHT   triamcinolone (NASACORT) 55 MCG/ACT AERO nasal inhaler Place into the nose.   triamcinolone cream (KENALOG) 0.1 % Apply 1 application topically 2 (two) times daily.   [DISCONTINUED] metoprolol tartrate (LOPRESSOR) 25 MG tablet TAKE 1 TABLET BY MOUTH TWICE A DAY   No facility-administered encounter medications on file as of 08/27/2022.  Lab Results  Component Value Date   WBC 5.5 08/27/2022   HGB 11.6 (L) 08/27/2022   HCT 33.8 (L) 08/27/2022   PLT 232.0 08/27/2022   GLUCOSE 108 (H) 08/27/2022   CHOL 137 03/14/2021   TRIG 123.0 03/14/2021   HDL 54.70 03/14/2021   LDLCALC 58 03/14/2021   ALT 11 06/12/2021   AST 21 06/12/2021   NA 125 (L) 08/27/2022   K 4.6 08/27/2022   CL 93 (L) 08/27/2022   CREATININE 0.90 08/27/2022   BUN 13 08/27/2022   CO2 25 08/27/2022   TSH 5.10 08/27/2022   PSA 0.32 08/27/2022   INR 1.13 06/11/2016   HGBA1C 6.1 03/14/2021   MICROALBUR 1.6 05/25/2014       Assessment & Plan:   Problem List Items Addressed This Visit     Adult hypothyroidism    On thyroid  replacement.  Follow tsh.       Anemia    Check cbc.       Relevant Orders   IBC + Ferritin   Carotid artery stenosis    Evaluated by [facility] 04/2021. Stable.  Recommended f/u in one year.  Continue aspirin and plavix.  Appears is overdue.      Chronic systolic CHF (congestive heart failure) ([place])    Is followed by cardiology - Dr Saralyn Pilar.  ECHO 2018 - EF 45%.  Last evaluated 05/2021.  Stable.  Recommended f/u in 6 months.  Overdue. No evidence of volume overload on exam.  Follow. Check metabolic panel.       Coronary atherosclerosis of native coronary artery    Continue risk factor modification.  Continue crestor, metoprolol and lisinopril.  Followed by cardiology.       Essential hypertension, benign    Blood pressure as outlined.  Continue lisinopril, metoprolol and amlodipine.  Continue current medication regimen.  Follow pressures.  Follow metabolic panel.       H/O total knee replacement    Knees doing well.  Follow.        HLD (hyperlipidemia)    On crestor.  Low cholesterol diet and exercise.  Follow lipid panel and liver function tests.        Hyperglycemia    Low carb diet and exercise.  Follow met b and a1c.        Hyponatremia    Previously noted to have low sodium.  Recheck today to confirm stable/normal.      Relevant Orders   Sodium   Sodium, urine, random   Osmolality   Hypothyroidism    On thyroid replacement.  Follow TSH      Memory change - Primary    Noticed some minimal change recently.  Check labs, including cbc, I09, TSH and metabolic panel.  MMSE 26/30.       Relevant Orders   Vitamin B12 (Completed)   Stress    Reports not being able to control his temper as well.  Discussed.  On citalopram.  Takes lorazepam.  Have discussed trying to taper.  Given change in temper, mood - discussed psychiatry evaluation.  He is agreeable.  Will hold on making changes in medication at this time.  Follow.       Relevant Orders   Ambulatory referral to  Psychiatry   Other Visit Diagnoses     Prostate cancer screening            Einar Pheasant, MD

## 2022-08-27 NOTE — Telephone Encounter (Signed)
Rx sent in for metoprolol

## 2022-08-28 ENCOUNTER — Encounter: Payer: Self-pay | Admitting: Internal Medicine

## 2022-08-28 NOTE — Assessment & Plan Note (Signed)
On thyroid replacement.  Follow TSH 

## 2022-08-28 NOTE — Assessment & Plan Note (Signed)
Reports not being able to control his temper as well.  Discussed.  On citalopram.  Takes lorazepam.  Have discussed trying to taper.  Given change in temper, mood - discussed psychiatry evaluation.  He is agreeable.  Will hold on making changes in medication at this time.  Follow.

## 2022-08-28 NOTE — Assessment & Plan Note (Signed)
Previously noted to have low sodium.  Recheck today to confirm stable/normal.

## 2022-08-28 NOTE — Assessment & Plan Note (Signed)
Check cbc 

## 2022-08-28 NOTE — Addendum Note (Signed)
Addended by: Alisa Graff on: 08/28/2022 04:59 AM   Modules accepted: Orders

## 2022-08-28 NOTE — Assessment & Plan Note (Signed)
Knees doing well.  Follow.

## 2022-08-28 NOTE — Assessment & Plan Note (Signed)
On crestor.  Low cholesterol diet and exercise.  Follow lipid panel and liver function tests.   

## 2022-08-28 NOTE — Assessment & Plan Note (Signed)
Blood pressure as outlined.  Continue lisinopril, metoprolol and amlodipine.  Continue current medication regimen.  Follow pressures.  Follow metabolic panel.

## 2022-08-28 NOTE — Assessment & Plan Note (Addendum)
Noticed some minimal change recently.  Check labs, including cbc, J34, TSH and metabolic panel.  MMSE 26/30.

## 2022-08-28 NOTE — Assessment & Plan Note (Signed)
Continue risk factor modification.  Continue crestor, metoprolol and lisinopril.  Followed by cardiology.

## 2022-08-28 NOTE — Assessment & Plan Note (Signed)
Low carb diet and exercise.  Follow met b and a1c.   

## 2022-08-28 NOTE — Assessment & Plan Note (Signed)
Is followed by cardiology - Dr Saralyn Pilar.  ECHO 2018 - EF 45%.  Last evaluated 05/2021.  Stable.  Recommended f/u in 6 months.  Overdue. No evidence of volume overload on exam.  Follow. Check metabolic panel.

## 2022-08-28 NOTE — Assessment & Plan Note (Addendum)
Evaluated by [facility] 04/2021. Stable.  Recommended f/u in one year.  Continue aspirin and plavix.  Appears is overdue.

## 2022-08-28 NOTE — Assessment & Plan Note (Signed)
On thyroid replacement.  Follow tsh.  

## 2022-08-29 ENCOUNTER — Other Ambulatory Visit (INDEPENDENT_AMBULATORY_CARE_PROVIDER_SITE_OTHER): Payer: Medicare Other

## 2022-08-29 DIAGNOSIS — E871 Hypo-osmolality and hyponatremia: Secondary | ICD-10-CM | POA: Diagnosis not present

## 2022-08-29 DIAGNOSIS — D649 Anemia, unspecified: Secondary | ICD-10-CM

## 2022-08-29 DIAGNOSIS — R739 Hyperglycemia, unspecified: Secondary | ICD-10-CM | POA: Diagnosis not present

## 2022-08-29 DIAGNOSIS — E785 Hyperlipidemia, unspecified: Secondary | ICD-10-CM | POA: Diagnosis not present

## 2022-08-29 LAB — IBC + FERRITIN
Ferritin: 190.2 ng/mL (ref 22.0–322.0)
Iron: 81 ug/dL (ref 42–165)
Saturation Ratios: 27.4 % (ref 20.0–50.0)
TIBC: 295.4 ug/dL (ref 250.0–450.0)
Transferrin: 211 mg/dL — ABNORMAL LOW (ref 212.0–360.0)

## 2022-08-29 LAB — HEPATIC FUNCTION PANEL
ALT: 9 U/L (ref 0–53)
AST: 16 U/L (ref 0–37)
Albumin: 4.5 g/dL (ref 3.5–5.2)
Alkaline Phosphatase: 85 U/L (ref 39–117)
Bilirubin, Direct: 0.2 mg/dL (ref 0.0–0.3)
Total Bilirubin: 0.7 mg/dL (ref 0.2–1.2)
Total Protein: 6.9 g/dL (ref 6.0–8.3)

## 2022-08-29 LAB — LIPID PANEL
Cholesterol: 142 mg/dL (ref 0–200)
HDL: 77.4 mg/dL (ref >39.00)
LDL Cholesterol: 51 mg/dL (ref 0–99)
NonHDL: 64.32
Total CHOL/HDL Ratio: 2
Triglycerides: 67 mg/dL (ref 0.0–149.0)
VLDL: 13.4 mg/dL (ref 0.0–40.0)

## 2022-08-29 LAB — SODIUM: Sodium: 130 mEq/L — ABNORMAL LOW (ref 135–145)

## 2022-08-29 LAB — HEMOGLOBIN A1C: Hgb A1c MFr Bld: 6.1 % (ref 4.6–6.5)

## 2022-08-30 ENCOUNTER — Other Ambulatory Visit: Payer: Self-pay

## 2022-08-30 ENCOUNTER — Other Ambulatory Visit: Payer: Self-pay | Admitting: Internal Medicine

## 2022-08-30 DIAGNOSIS — E871 Hypo-osmolality and hyponatremia: Secondary | ICD-10-CM

## 2022-08-30 LAB — SODIUM, URINE, RANDOM: Sodium, Ur: 44 mmol/L (ref 28–272)

## 2022-08-30 LAB — OSMOLALITY: Osmolality: 273 mOsm/kg — ABNORMAL LOW (ref 278–305)

## 2022-08-30 NOTE — Progress Notes (Signed)
Order placed for f/u lab.   

## 2022-09-05 DIAGNOSIS — I5022 Chronic systolic (congestive) heart failure: Secondary | ICD-10-CM | POA: Diagnosis not present

## 2022-09-06 ENCOUNTER — Other Ambulatory Visit (INDEPENDENT_AMBULATORY_CARE_PROVIDER_SITE_OTHER): Payer: Medicare Other

## 2022-09-06 DIAGNOSIS — E871 Hypo-osmolality and hyponatremia: Secondary | ICD-10-CM

## 2022-09-06 LAB — BASIC METABOLIC PANEL
BUN: 15 mg/dL (ref 6–23)
CO2: 29 mEq/L (ref 19–32)
Calcium: 9.4 mg/dL (ref 8.4–10.5)
Chloride: 96 mEq/L (ref 96–112)
Creatinine, Ser: 0.87 mg/dL (ref 0.40–1.50)
GFR: 80.47 mL/min (ref >60.00)
Glucose, Bld: 113 mg/dL — ABNORMAL HIGH (ref 70–99)
Potassium: 4.5 mEq/L (ref 3.5–5.1)
Sodium: 131 mEq/L — ABNORMAL LOW (ref 135–145)

## 2022-09-06 LAB — CORTISOL: Cortisol, Plasma: 12.6 ug/dL

## 2022-09-09 ENCOUNTER — Other Ambulatory Visit: Payer: Self-pay | Admitting: Internal Medicine

## 2022-09-10 NOTE — Telephone Encounter (Signed)
Prescription Request  09/10/2022  Is this a "Controlled Substance" medicine? Yes  LOV: 08/27/2022  What is the name of the medication or equipment? Lorazepam (ATIVAN) 1 MG tablet  Have you contacted your pharmacy to request a refill? No   Which pharmacy would you like this sent to?   [id number] [place] - [id number]: [phone]: [phone]   Patient notified that their request is being sent to the clinical staff for review and that they should receive a response within 2 business days.   Please advise at [phone]

## 2022-09-11 NOTE — Telephone Encounter (Signed)
I have sent in the order for lorazepam.  Have him try to take 1/2 tablet q hs (some nights) instead of one whole tablet q hs.

## 2022-09-18 ENCOUNTER — Ambulatory Visit (INDEPENDENT_AMBULATORY_CARE_PROVIDER_SITE_OTHER): Payer: Medicare Other

## 2022-09-18 VITALS — BP 120/77 | Ht 69.0 in | Wt 181.0 lb

## 2022-09-18 DIAGNOSIS — Z0000 Encounter for general adult medical examination without abnormal findings: Secondary | ICD-10-CM

## 2022-09-18 NOTE — Patient Instructions (Addendum)
Daniel Austin , Thank you for taking time to come for your Medicare Wellness Visit. I appreciate your ongoing commitment to your health goals. Please review the following plan we discussed and let me know if I can assist you in the future.   These are the goals we discussed:  Goals       Patient Stated     Maintain Healthy Lifestyle (pt-stated)      Stay active Stay hydrated  Healthy diet        This is a list of the screening recommended for you and due dates:  Health Maintenance  Topic Date Due   Zoster (Shingles) Vaccine (1 of 2) 11/26/2022*   Flu Shot  12/23/2022*   COVID-19 Vaccine (5 - 2023-24 season) 2022/10/06   Medicare Annual Wellness Visit  09/19/2023   DTaP/Tdap/Td vaccine (4 - Td or Tdap) 06/13/2031   Pneumonia Vaccine  Completed   HPV Vaccine  Aged Out  *Topic was postponed. The date shown is not the original due date.    Advanced directives: End of life planning; Advanced aging; Advanced directives discussed.  No HCPOA/Living Will.  Per request, additional information given to take home and discuss with his family.   Conditions/risks identified: none new  Next appointment: Follow up in one year for your annual wellness visit.   Preventive Care 75 Years and Older, Male  Preventive care refers to lifestyle choices and visits with your health care provider that can promote health and wellness. What does preventive care include? A yearly physical exam. This is also called an annual well check. Dental exams once or twice a year. Routine eye exams. Ask your health care provider how often you should have your eyes checked. Personal lifestyle choices, including: Daily care of your teeth and gums. Regular physical activity. Eating a healthy diet. Avoiding tobacco and drug use. Limiting alcohol use. Practicing safe sex. Taking low doses of aspirin every day. Taking vitamin and mineral supplements as recommended by your health care provider. What happens during an  annual well check? The services and screenings done by your health care provider during your annual well check will depend on your age, overall health, lifestyle risk factors, and family history of disease. Counseling  Your health care provider may ask you questions about your: Alcohol use. Tobacco use. Drug use. Emotional well-being. Home and relationship well-being. Sexual activity. Eating habits. History of falls. Memory and ability to understand (cognition). Work and work Statistician. Screening  You may have the following tests or measurements: Height, weight, and BMI. Blood pressure. Lipid and cholesterol levels. These may be checked every 5 years, or more frequently if you are over 80 years old. Skin check. Lung cancer screening. You may have this screening every year starting at age 69 if you have a 30-pack-year history of smoking and currently smoke or have quit within the past 15 years. Fecal occult blood test (FOBT) of the stool. You may have this test every year starting at age 50. Flexible sigmoidoscopy or colonoscopy. You may have a sigmoidoscopy every 5 years or a colonoscopy every 10 years starting at age 57. Prostate cancer screening. Recommendations will vary depending on your family history and other risks. Hepatitis C blood test. Hepatitis B blood test. Sexually transmitted disease (STD) testing. Diabetes screening. This is done by checking your blood sugar (glucose) after you have not eaten for a while (fasting). You may have this done every 1-3 years. Abdominal aortic aneurysm (AAA) screening. You may need this  if you are a current or former smoker. Osteoporosis. You may be screened starting at age 75 if you are at high risk. Talk with your health care provider about your test results, treatment options, and if necessary, the need for more tests. Vaccines  Your health care provider may recommend certain vaccines, such as: Influenza vaccine. This is recommended  every year. Tetanus, diphtheria, and acellular pertussis (Tdap, Td) vaccine. You may need a Td booster every 10 years. Zoster vaccine. You may need this after age 96. Pneumococcal 13-valent conjugate (PCV13) vaccine. One dose is recommended after age 84. Pneumococcal polysaccharide (PPSV23) vaccine. One dose is recommended after age 13. Talk to your health care provider about which screenings and vaccines you need and how often you need them. This information is not intended to replace advice given to you by your health care provider. Make sure you discuss any questions you have with your health care provider. Document Released: 10/07/2015 Document Revised: 05/30/2016 Document Reviewed: 07/12/2015 Elsevier Interactive Patient Education  2017 [place] Prevention in the Home Falls can cause injuries. They can happen to people of all ages. There are many things you can do to make your home safe and to help prevent falls. What can I do on the outside of my home? Regularly fix the edges of walkways and driveways and fix any cracks. Remove anything that might make you trip as you walk through a door, such as a raised step or threshold. Trim any bushes or trees on the path to your home. Use bright outdoor lighting. Clear any walking paths of anything that might make someone trip, such as rocks or tools. Regularly check to see if handrails are loose or broken. Make sure that both sides of any steps have handrails. Any raised decks and porches should have guardrails on the edges. Have any leaves, snow, or ice cleared regularly. Use sand or salt on walking paths during winter. Clean up any spills in your garage right away. This includes oil or grease spills. What can I do in the bathroom? Use night lights. Install grab bars by the toilet and in the tub and shower. Do not use towel bars as grab bars. Use non-skid mats or decals in the tub or shower. If you need to sit down in the shower,  use a plastic, non-slip stool. Keep the floor dry. Clean up any water that spills on the floor as soon as it happens. Remove soap buildup in the tub or shower regularly. Attach bath mats securely with double-sided non-slip rug tape. Do not have throw rugs and other things on the floor that can make you trip. What can I do in the bedroom? Use night lights. Make sure that you have a light by your bed that is easy to reach. Do not use any sheets or blankets that are too big for your bed. They should not hang down onto the floor. Have a firm chair that has side arms. You can use this for support while you get dressed. Do not have throw rugs and other things on the floor that can make you trip. What can I do in the kitchen? Clean up any spills right away. Avoid walking on wet floors. Keep items that you use a lot in easy-to-reach places. If you need to reach something above you, use a strong step stool that has a grab bar. Keep electrical cords out of the way. Do not use floor polish or wax that makes floors slippery.  If you must use wax, use non-skid floor wax. Do not have throw rugs and other things on the floor that can make you trip. What can I do with my stairs? Do not leave any items on the stairs. Make sure that there are handrails on both sides of the stairs and use them. Fix handrails that are broken or loose. Make sure that handrails are as long as the stairways. Check any carpeting to make sure that it is firmly attached to the stairs. Fix any carpet that is loose or worn. Avoid having throw rugs at the top or bottom of the stairs. If you do have throw rugs, attach them to the floor with carpet tape. Make sure that you have a light switch at the top of the stairs and the bottom of the stairs. If you do not have them, ask someone to add them for you. What else can I do to help prevent falls? Wear shoes that: Do not have high heels. Have rubber bottoms. Are comfortable and fit you  well. Are closed at the toe. Do not wear sandals. If you use a stepladder: Make sure that it is fully opened. Do not climb a closed stepladder. Make sure that both sides of the stepladder are locked into place. Ask someone to hold it for you, if possible. Clearly mark and make sure that you can see: Any grab bars or handrails. First and last steps. Where the edge of each step is. Use tools that help you move around (mobility aids) if they are needed. These include: Canes. Walkers. Scooters. Crutches. Turn on the lights when you go into a dark area. Replace any light bulbs as soon as they burn out. Set up your furniture so you have a clear path. Avoid moving your furniture around. If any of your floors are uneven, fix them. If there are any pets around you, be aware of where they are. Review your medicines with your doctor. Some medicines can make you feel dizzy. This can increase your chance of falling. Ask your doctor what other things that you can do to help prevent falls. This information is not intended to replace advice given to you by your health care provider. Make sure you discuss any questions you have with your health care provider. Document Released: 07/07/2009 Document Revised: 02/16/2016 Document Reviewed: 10/15/2014 Elsevier Interactive Patient Education  2017 [employer].

## 2022-09-18 NOTE — Progress Notes (Signed)
Subjective:   Daniel Austin is a 81 y.o. male who presents for Medicare Annual/Subsequent preventive examination.  Review of Systems    No ROS.  Medicare Wellness   Cardiac Risk Factors include: advanced age (>50mn, >>14women);male gender     Objective:    Today's Vitals   09/18/22 1511  BP: 120/77  SpO2: 97%  Weight: 181 lb (82.1 kg)  Height: '5\' 9"'$  (1.753 m)   Body mass index is 26.73 kg/m.     09/18/2022    3:16 PM 07/06/2021   11:55 AM 06/12/2021    7:03 PM 05/01/2021    1:28 PM 04/20/2020    6:15 PM 02/05/2020   11:22 AM 09/12/2018   10:26 AM  Advanced Directives  Does Patient Have a Medical Advance Directive? No No No No No No No  Would patient like information on creating a medical advance directive? Yes (MAU/Ambulatory/Procedural Areas - Information given)  No - Patient declined No - Patient declined  Yes (MAU/Ambulatory/Procedural Areas - Information given) No - Patient declined    Current Medications (verified) Outpatient Encounter Medications as of 09/18/2022  Medication Sig   acetaminophen (TYLENOL) 500 MG tablet Take 1,000 mg by mouth every 6 (six) hours as needed.   amLODipine (NORVASC) 5 MG tablet TAKE 1 TABLET (5 MG TOTAL) BY MOUTH DAILY.   Aspirin 81 MG EC tablet Take 81 mg by mouth daily.   citalopram (CELEXA) 20 MG tablet TAKE 1 TABLET BY MOUTH EVERY DAY   clopidogrel (PLAVIX) 75 MG tablet TAKE 1 TABLET BY MOUTH EVERY DAY   fexofenadine (ALLEGRA) 180 MG tablet Take 180 mg by mouth daily. As needed   levothyroxine (SYNTHROID) 125 MCG tablet TAKE 1 TABLET BY MOUTH EVERY DAY BEFORE BREAKFAST   lisinopril (ZESTRIL) 40 MG tablet TAKE 1 TABLET BY MOUTH EVERY DAY   LORazepam (ATIVAN) 1 MG tablet 1/2-1 TABLET AT BEDTIME AS NEEDED .   metoprolol tartrate (LOPRESSOR) 25 MG tablet TAKE 1 TABLET BY MOUTH TWICE A DAY   nitroGLYCERIN (NITROSTAT) 0.4 MG SL tablet Place 0.4 mg under the tongue every 5 (five) minutes as needed. If a third tablet is needed please  call 911.   pantoprazole (PROTONIX) 40 MG tablet TAKE 1 TABLET BY MOUTH EVERY DAY   rosuvastatin (CRESTOR) 20 MG tablet TAKE 1 TABLET BY MOUTH EVERY DAY AT NIGHT   triamcinolone (NASACORT) 55 MCG/ACT AERO nasal inhaler Place into the nose.   triamcinolone cream (KENALOG) 0.1 % Apply 1 application topically 2 (two) times daily.   No facility-administered encounter medications on file as of 09/18/2022.    Allergies (verified) Beta adrenergic blockers and Lipitor [atorvastatin]   History: Past Medical History:  Diagnosis Date   Anemia    Anxiety    Arthritis    CAD (coronary artery disease)    Cardiomyopathy ([facility]    Carotid artery stenosis    high-grade symptomatic right sided s/p stent twice   CHF (congestive heart failure) (HCC)    chronic movement fluid and history of CHF he noticed in the    Depression    Fractures, multiple    Metal rod in left foot   GERD (gastroesophageal reflux disease)    Heart block    Hypercholesterolemia    Hyperglycemia    Hypertension    Hypothyroidism    Knee pain    Presence of permanent cardiac pacemaker    11/16/2015 Medtronic ADR01,   (2008 St Jude)   Shortness of breath dyspnea  Stroke [facility])    clot in neck broke off left side weakness 2008  resolved   Syncope    second degree heart block s/p St Jude's pacemaker placement 12/08 with his history is limited with any   Ulcer of gastric fundus    Past Surgical History:  Procedure Laterality Date   CARDIAC CATHETERIZATION     CATARACT EXTRACTION W/PHACO Right 03/07/2021   Procedure: CATARACT EXTRACTION PHACO AND INTRAOCULAR LENS PLACEMENT ([place]) RIGHT 8.69 00:55.2;  Surgeon: Birder Robson, MD;  Location: [facility];  Service: Ophthalmology;  Laterality: Right;   COLONOSCOPY N/A 07/06/2021   Procedure: COLONOSCOPY;  Surgeon: Annamaria Helling, DO;  Location: [facility] ENDOSCOPY;  Service: Gastroenterology;  Laterality: N/A;   CORONARY ANGIOPLASTY     stents x 3   heart stint   11/04/2014   [facility]   HERNIA REPAIR     inguinal   INSERT / REPLACE / REMOVE PACEMAKER     JOINT REPLACEMENT     right knee   KNEE ARTHROPLASTY Right 02/08/2016   Procedure: COMPUTER ASSISTED TOTAL KNEE ARTHROPLASTY;  Surgeon: Dereck Leep, MD;  Location: [facility] ORS;  Service: Orthopedics;  Laterality: Right;   KNEE ARTHROPLASTY Left 06/27/2016   Procedure: COMPUTER ASSISTED TOTAL KNEE ARTHROPLASTY;  Surgeon: Dereck Leep, MD;  Location: [facility] ORS;  Service: Orthopedics;  Laterality: Left;   PACEMAKER INSERTION  08/2007   PACEMAKER INSERTION N/A 11/16/2015   Procedure: INSERTION PACEMAKER/ PACEMAKER CHANGE OUT;  Surgeon: Isaias Cowman, MD;  Location: [facility] ORS;  Service: Cardiovascular;  Laterality: N/A;   PERCUTANEOUS PLACEMENT INTRAVASCULAR STENT CERVICAL CAROTID ARTERY     twice, right carotid artery   Family History  Problem Relation Age of Onset   Heart disease Mother        CHF   Heart disease Father        CHF   Thyroid disease Father        hypothyroidism   Colon polyps Father    Diabetes Sister        diet controlled   Thyroid disease Sister    Cancer Sister        breast    Thyroid disease Sister    Cancer Sister        breast   Colon cancer Neg Hx    Prostate cancer Neg Hx    Social History   Socioeconomic History   Marital status: Married    Spouse name: Not on file   Number of children: 1   Years of education: Not on file   Highest education level: Not on file  Occupational History   Not on file  Tobacco Use   Smoking status: Never   Smokeless tobacco: Never  Vaping Use   Vaping Use: Never used  Substance and Sexual Activity   Alcohol use: No    Alcohol/week: 0.0 standard drinks of alcohol   Drug use: No   Sexual activity: Not Currently  Other Topics Concern   Not on file  Social History Narrative   Not on file   Social Determinants of Health   Financial Resource Strain: Low Risk  (09/18/2022)   Overall Financial Resource Strain  (CARDIA)    Difficulty of Paying Living Expenses: Not hard at all  Food Insecurity: No Food Insecurity (09/18/2022)   Hunger Vital Sign    Worried About Running Out of Food in the Last Year: Never true    Ran Out of Food in the Last Year: Never  true  Transportation Needs: No Transportation Needs (09/18/2022)   PRAPARE - Hydrologist (Medical): No    Lack of Transportation (Non-Medical): No  Physical Activity: Insufficiently Active (09/18/2022)   Exercise Vital Sign    Days of Exercise per Week: 5 days    Minutes of Exercise per Session: 10 min  Stress: No Stress Concern Present (09/18/2022)   [place]    Feeling of Stress : Not at all  Social Connections: Unknown (09/18/2022)   Social Connection and Isolation Panel [NHANES]    Frequency of Communication with Friends and Family: More than three times a week    Frequency of Social Gatherings with Friends and Family: More than three times a week    Attends Religious Services: Not on Advertising copywriter or Organizations: Not on file    Attends Archivist Meetings: Not on file    Marital Status: Married    Tobacco Counseling Counseling given: Not Answered   Clinical Intake:  Pre-visit preparation completed: Yes        Diabetes: No  How often do you need to have someone help you when you read instructions, pamphlets, or other written materials from your doctor or pharmacy?: 1 - Never    Interpreter Needed?: No      Activities of Daily Living    09/18/2022    3:17 PM  In your present state of health, do you have any difficulty performing the following activities:  Hearing? 0  Vision? 1  Difficulty concentrating or making decisions? 0  Walking or climbing stairs? 1  Dressing or bathing? 0  Doing errands, shopping? 0  Preparing Food and eating ? N  Using the Toilet? N  In the past six months, have  you accidently leaked urine? N  Do you have problems with loss of bowel control? N  Managing your Medications? N  Managing your Finances? N  Housekeeping or managing your Housekeeping? N    Patient Care Team: Einar Pheasant, MD as PCP - General (Internal Medicine)  Indicate any recent Medical Services you may have received from other than [facility] providers in the past year (date may be approximate).     Assessment:   This is a routine wellness examination for Vidur.  Hearing/Vision screen Hearing Screening - Comments:: Patient is able to hear conversational tones without difficulty.  No issues reported.   Vision Screening - Comments:: Followed by [facility]  Blindness L eye  Wears corrective lenses    Dietary issues and exercise activities discussed: Current Exercise Habits: Home exercise routine, Type of exercise: walking, Time (Minutes): 15, Frequency (Times/Week): 5, Weekly Exercise (Minutes/Week): 75, Intensity: Mild   Goals Addressed               This Visit's Progress     Patient Stated     Maintain Healthy Lifestyle (pt-stated)        Stay active Stay hydrated  Healthy diet       Depression Screen    09/18/2022    3:41 PM 08/27/2022    9:01 AM 05/01/2021    1:29 PM 10/03/2020   11:24 AM 02/05/2020   11:17 AM 09/12/2018   10:27 AM 09/12/2017    8:29 AM  PHQ 2/9 Scores  PHQ - 2 Score 4 4 0 0 0 0 0  PHQ- 9 Score 8 8     0  Fall Risk    09/18/2022    3:17 PM 08/27/2022    9:01 AM 05/01/2021    1:29 PM 10/03/2020   11:24 AM 02/05/2020   11:16 AM  Fall Risk   Falls in the past year? 1 1 0 1 0  Number falls in past yr:  1  1 0  Injury with Fall?  0 0 0   Risk for fall due to : Impaired balance/gait No Fall Risks     Risk for fall due to: Comment Cane in use      Follow up Falls evaluation completed Falls evaluation completed Falls evaluation completed Falls evaluation completed Falls evaluation completed    [place]: Home free of loose throw rugs in walkways, pet beds, electrical cords, etc? Yes  Adequate lighting in your home to reduce risk of falls? Yes   ASSISTIVE DEVICES UTILIZED TO PREVENT FALLS: Life alert? No  Use of a cane, walker or w/c? Yes   TIMED UP AND GO: Was the test performed? Yes .  Length of time to ambulate 10 feet: 15 sec.   Gait slow and steady with assistive device  Cognitive Function: 6CIT completed. Declines additional follow up at this time.     08/27/2022    9:46 AM 09/12/2017    8:33 AM  MMSE - Mini Mental State Exam  Orientation to time 1 5  Orientation to Place 5 5  Registration 3 3  Attention/ Calculation 5 5  Recall 3 3  Language- name 2 objects 2 2  Language- repeat 1 1  Language- follow 3 step command 3 3  Language- read & follow direction 1 1  Write a sentence 1 1  Copy design 1 1  Total score 26 30        09/18/2022    3:19 PM 05/01/2021    1:35 PM 02/05/2020   11:28 AM 09/12/2018   10:28 AM 09/11/2016    8:30 AM  6CIT Screen  What Year? 0 points 0 points 0 points 0 points 0 points  What month? 3 points 0 points 0 points 0 points 0 points  What time? 3 points 0 points  0 points 0 points  Count back from 20 0 points 0 points 0 points 0 points 0 points  Months in reverse 4 points 0 points  0 points 0 points  Repeat phrase 0 points  2 points 0 points   Total Score 10 points   0 points     Immunizations Immunization History  Administered Date(s) Administered   COVID-19, mRNA, vaccine(Comirnaty)12 years and older 08/06/2022   DTaP 11/04/2006   Fluad Quad(high Dose 65+) 05/30/2019, 06/14/2020   Influenza Nasal 09/03/2012, 07/06/2013   Influenza, High Dose Seasonal PF 10/25/2016, 09/25/2017, 06/20/2018   Influenza,inj,Quad PF,6+ Mos 05/28/2014, 05/25/2015   Influenza-Unspecified 07/22/2012, 07/17/2013, 05/28/2014, 05/25/2015   Moderna Sars-Covid-2 Vaccination 04/14/2020, 05/12/2020, 11/16/2020   Pneumococcal Conjugate-13  01/10/2017   Pneumococcal Polysaccharide-23 04/18/2018   Td 01/21/2019   Tdap 06/12/2021   Screening Tests Health Maintenance  Topic Date Due   Zoster Vaccines- Shingrix (1 of 2) 11/26/2022 (Originally 05/18/1960)   INFLUENZA VACCINE  12/23/2022 (Originally 04/24/2022)   COVID-19 Vaccine (5 - 2023-24 season) 2022-10-27   Medicare Annual Wellness (AWV)  09/19/2023   DTaP/Tdap/Td (4 - Td or Tdap) 06/13/2031   Pneumonia Vaccine 71+ Years old  Completed   HPV VACCINES  Aged Out   Health Maintenance There are no preventive care  reminders to display for this patient.  Lung Cancer Screening: (Low Dose CT Chest recommended if Age 92-80 years, 30 pack-year currently smoking OR have quit w/in 15years.) does not qualify.   Hepatitis C Screening: does not qualify.  Vision Screening: Recommended annual ophthalmology exams for early detection of glaucoma and other disorders of the eye.  Dental Screening: Recommended annual dental exams for proper oral hygiene.  Community Resource Referral / Chronic Care Management: CRR required this visit?  No   CCM required this visit?  No      Plan:     I have personally reviewed and noted the following in the patient's chart:   Medical and social history Use of alcohol, tobacco or illicit drugs  Current medications and supplements including opioid prescriptions. Patient is not currently taking opioid prescriptions. Functional ability and status Nutritional status Physical activity Advanced directives List of other physicians Hospitalizations, surgeries, and ER visits in previous 12 months Vitals Screenings to include cognitive, depression, and falls Referrals and appointments  In addition, I have reviewed and discussed with patient certain preventive protocols, quality metrics, and best practice recommendations. A written personalized care plan for preventive services as well as general preventive health recommendations were provided to  patient.     Leta Jungling, LPN   [id number]

## 2022-09-18 NOTE — Telephone Encounter (Signed)
Lm for darlene

## 2022-09-25 DIAGNOSIS — I499 Cardiac arrhythmia, unspecified: Secondary | ICD-10-CM | POA: Diagnosis not present

## 2022-09-25 DIAGNOSIS — Z743 Need for continuous supervision: Secondary | ICD-10-CM | POA: Diagnosis not present

## 2022-09-25 DIAGNOSIS — R6889 Other general symptoms and signs: Secondary | ICD-10-CM | POA: Diagnosis not present

## 2022-09-25 DIAGNOSIS — R0689 Other abnormalities of breathing: Secondary | ICD-10-CM | POA: Diagnosis not present

## 2022-09-25 DIAGNOSIS — R231 Pallor: Secondary | ICD-10-CM | POA: Diagnosis not present

## 2022-09-26 ENCOUNTER — Inpatient Hospital Stay: Payer: Medicare Other | Admitting: Anesthesiology

## 2022-09-26 ENCOUNTER — Inpatient Hospital Stay
Admit: 2022-09-26 | Discharge: 2022-09-26 | Disposition: A | Discharge status: Home / Self Care | Payer: Medicare Other | Attending: Pulmonary Disease | Admitting: Pulmonary Disease

## 2022-09-26 ENCOUNTER — Emergency Department: Payer: Medicare Other

## 2022-09-26 ENCOUNTER — Inpatient Hospital Stay
Admission: EM | Admit: 2022-09-26 | Discharge: 2022-10-25 | DRG: 853 | Disposition: E | Payer: Medicare Other | Attending: Internal Medicine | Admitting: Internal Medicine

## 2022-09-26 ENCOUNTER — Encounter
Admission: EM | Disposition: E | Payer: Self-pay | Source: Home / Self Care | Attending: Student in an Organized Health Care Education/Training Program

## 2022-09-26 ENCOUNTER — Encounter: Payer: Self-pay | Admitting: Internal Medicine

## 2022-09-26 ENCOUNTER — Inpatient Hospital Stay: Payer: Medicare Other

## 2022-09-26 ENCOUNTER — Other Ambulatory Visit: Payer: Self-pay

## 2022-09-26 DIAGNOSIS — I959 Hypotension, unspecified: Secondary | ICD-10-CM

## 2022-09-26 DIAGNOSIS — Z961 Presence of intraocular lens: Secondary | ICD-10-CM | POA: Diagnosis present

## 2022-09-26 DIAGNOSIS — R569 Unspecified convulsions: Secondary | ICD-10-CM | POA: Diagnosis not present

## 2022-09-26 DIAGNOSIS — K2990 Gastroduodenitis, unspecified, without bleeding: Secondary | ICD-10-CM

## 2022-09-26 DIAGNOSIS — K5730 Diverticulosis of large intestine without perforation or abscess without bleeding: Secondary | ICD-10-CM | POA: Diagnosis not present

## 2022-09-26 DIAGNOSIS — K559 Vascular disorder of intestine, unspecified: Secondary | ICD-10-CM | POA: Diagnosis not present

## 2022-09-26 DIAGNOSIS — K91840 Postprocedural hemorrhage and hematoma of a digestive system organ or structure following a digestive system procedure: Secondary | ICD-10-CM | POA: Diagnosis not present

## 2022-09-26 DIAGNOSIS — E872 Acidosis, unspecified: Secondary | ICD-10-CM | POA: Diagnosis not present

## 2022-09-26 DIAGNOSIS — K599 Functional intestinal disorder, unspecified: Secondary | ICD-10-CM | POA: Diagnosis not present

## 2022-09-26 DIAGNOSIS — Z79890 Hormone replacement therapy (postmenopausal): Secondary | ICD-10-CM

## 2022-09-26 DIAGNOSIS — I441 Atrioventricular block, second degree: Secondary | ICD-10-CM | POA: Diagnosis present

## 2022-09-26 DIAGNOSIS — F32A Depression, unspecified: Secondary | ICD-10-CM | POA: Diagnosis present

## 2022-09-26 DIAGNOSIS — J9601 Acute respiratory failure with hypoxia: Secondary | ICD-10-CM | POA: Diagnosis not present

## 2022-09-26 DIAGNOSIS — M419 Scoliosis, unspecified: Secondary | ICD-10-CM | POA: Diagnosis not present

## 2022-09-26 DIAGNOSIS — I700 Atherosclerosis of aorta: Secondary | ICD-10-CM | POA: Diagnosis not present

## 2022-09-26 DIAGNOSIS — J90 Pleural effusion, not elsewhere classified: Secondary | ICD-10-CM | POA: Diagnosis not present

## 2022-09-26 DIAGNOSIS — Z66 Do not resuscitate: Secondary | ICD-10-CM | POA: Diagnosis not present

## 2022-09-26 DIAGNOSIS — E871 Hypo-osmolality and hyponatremia: Secondary | ICD-10-CM | POA: Diagnosis not present

## 2022-09-26 DIAGNOSIS — I2489 Other forms of acute ischemic heart disease: Secondary | ICD-10-CM | POA: Diagnosis present

## 2022-09-26 DIAGNOSIS — Z888 Allergy status to other drugs, medicaments and biological substances status: Secondary | ICD-10-CM

## 2022-09-26 DIAGNOSIS — M19012 Primary osteoarthritis, left shoulder: Secondary | ICD-10-CM | POA: Diagnosis not present

## 2022-09-26 DIAGNOSIS — I2510 Atherosclerotic heart disease of native coronary artery without angina pectoris: Secondary | ICD-10-CM | POA: Diagnosis not present

## 2022-09-26 DIAGNOSIS — T68XXXA Hypothermia, initial encounter: Secondary | ICD-10-CM

## 2022-09-26 DIAGNOSIS — G9341 Metabolic encephalopathy: Secondary | ICD-10-CM | POA: Diagnosis present

## 2022-09-26 DIAGNOSIS — Z8249 Family history of ischemic heart disease and other diseases of the circulatory system: Secondary | ICD-10-CM

## 2022-09-26 DIAGNOSIS — D62 Acute posthemorrhagic anemia: Secondary | ICD-10-CM | POA: Diagnosis not present

## 2022-09-26 DIAGNOSIS — R1110 Vomiting, unspecified: Secondary | ICD-10-CM | POA: Diagnosis not present

## 2022-09-26 DIAGNOSIS — M47814 Spondylosis without myelopathy or radiculopathy, thoracic region: Secondary | ICD-10-CM | POA: Diagnosis not present

## 2022-09-26 DIAGNOSIS — Z955 Presence of coronary angioplasty implant and graft: Secondary | ICD-10-CM

## 2022-09-26 DIAGNOSIS — A419 Sepsis, unspecified organism: Principal | ICD-10-CM

## 2022-09-26 DIAGNOSIS — K4040 Unilateral inguinal hernia, with gangrene, not specified as recurrent: Secondary | ICD-10-CM | POA: Diagnosis not present

## 2022-09-26 DIAGNOSIS — Z95828 Presence of other vascular implants and grafts: Secondary | ICD-10-CM

## 2022-09-26 DIAGNOSIS — K219 Gastro-esophageal reflux disease without esophagitis: Secondary | ICD-10-CM | POA: Diagnosis present

## 2022-09-26 DIAGNOSIS — R680 Hypothermia, not associated with low environmental temperature: Secondary | ICD-10-CM | POA: Diagnosis present

## 2022-09-26 DIAGNOSIS — J9602 Acute respiratory failure with hypercapnia: Secondary | ICD-10-CM | POA: Diagnosis not present

## 2022-09-26 DIAGNOSIS — I5022 Chronic systolic (congestive) heart failure: Secondary | ICD-10-CM | POA: Diagnosis present

## 2022-09-26 DIAGNOSIS — Z7902 Long term (current) use of antithrombotics/antiplatelets: Secondary | ICD-10-CM

## 2022-09-26 DIAGNOSIS — Z6827 Body mass index (BMI) 27.0-27.9, adult: Secondary | ICD-10-CM

## 2022-09-26 DIAGNOSIS — Z8673 Personal history of transient ischemic attack (TIA), and cerebral infarction without residual deficits: Secondary | ICD-10-CM

## 2022-09-26 DIAGNOSIS — K922 Gastrointestinal hemorrhage, unspecified: Secondary | ICD-10-CM | POA: Diagnosis not present

## 2022-09-26 DIAGNOSIS — Z4682 Encounter for fitting and adjustment of non-vascular catheter: Secondary | ICD-10-CM | POA: Diagnosis not present

## 2022-09-26 DIAGNOSIS — K668 Other specified disorders of peritoneum: Secondary | ICD-10-CM | POA: Diagnosis not present

## 2022-09-26 DIAGNOSIS — I255 Ischemic cardiomyopathy: Secondary | ICD-10-CM | POA: Diagnosis present

## 2022-09-26 DIAGNOSIS — Z96653 Presence of artificial knee joint, bilateral: Secondary | ICD-10-CM | POA: Diagnosis present

## 2022-09-26 DIAGNOSIS — K5939 Other megacolon: Secondary | ICD-10-CM | POA: Diagnosis not present

## 2022-09-26 DIAGNOSIS — R578 Other shock: Secondary | ICD-10-CM | POA: Diagnosis not present

## 2022-09-26 DIAGNOSIS — I447 Left bundle-branch block, unspecified: Secondary | ICD-10-CM | POA: Diagnosis present

## 2022-09-26 DIAGNOSIS — K4030 Unilateral inguinal hernia, with obstruction, without gangrene, not specified as recurrent: Secondary | ICD-10-CM | POA: Diagnosis present

## 2022-09-26 DIAGNOSIS — Z1152 Encounter for screening for COVID-19: Secondary | ICD-10-CM | POA: Diagnosis not present

## 2022-09-26 DIAGNOSIS — Z515 Encounter for palliative care: Secondary | ICD-10-CM

## 2022-09-26 DIAGNOSIS — I708 Atherosclerosis of other arteries: Secondary | ICD-10-CM | POA: Diagnosis not present

## 2022-09-26 DIAGNOSIS — E861 Hypovolemia: Secondary | ICD-10-CM | POA: Diagnosis present

## 2022-09-26 DIAGNOSIS — E440 Moderate protein-calorie malnutrition: Secondary | ICD-10-CM | POA: Diagnosis present

## 2022-09-26 DIAGNOSIS — I110 Hypertensive heart disease with heart failure: Secondary | ICD-10-CM | POA: Diagnosis present

## 2022-09-26 DIAGNOSIS — K55059 Acute (reversible) ischemia of intestine, part and extent unspecified: Secondary | ICD-10-CM | POA: Diagnosis not present

## 2022-09-26 DIAGNOSIS — I3139 Other pericardial effusion (noninflammatory): Secondary | ICD-10-CM | POA: Diagnosis present

## 2022-09-26 DIAGNOSIS — G319 Degenerative disease of nervous system, unspecified: Secondary | ICD-10-CM | POA: Diagnosis not present

## 2022-09-26 DIAGNOSIS — K5900 Constipation, unspecified: Secondary | ICD-10-CM | POA: Diagnosis present

## 2022-09-26 DIAGNOSIS — I96 Gangrene, not elsewhere classified: Secondary | ICD-10-CM | POA: Diagnosis not present

## 2022-09-26 DIAGNOSIS — I509 Heart failure, unspecified: Secondary | ICD-10-CM | POA: Diagnosis not present

## 2022-09-26 DIAGNOSIS — M1990 Unspecified osteoarthritis, unspecified site: Secondary | ICD-10-CM | POA: Diagnosis present

## 2022-09-26 DIAGNOSIS — K2970 Gastritis, unspecified, without bleeding: Secondary | ICD-10-CM | POA: Diagnosis present

## 2022-09-26 DIAGNOSIS — E876 Hypokalemia: Secondary | ICD-10-CM | POA: Diagnosis not present

## 2022-09-26 DIAGNOSIS — I7143 Infrarenal abdominal aortic aneurysm, without rupture: Secondary | ICD-10-CM | POA: Diagnosis present

## 2022-09-26 DIAGNOSIS — N179 Acute kidney failure, unspecified: Secondary | ICD-10-CM | POA: Diagnosis not present

## 2022-09-26 DIAGNOSIS — F419 Anxiety disorder, unspecified: Secondary | ICD-10-CM | POA: Diagnosis present

## 2022-09-26 DIAGNOSIS — K529 Noninfective gastroenteritis and colitis, unspecified: Secondary | ICD-10-CM

## 2022-09-26 DIAGNOSIS — K9401 Colostomy hemorrhage: Secondary | ICD-10-CM | POA: Diagnosis not present

## 2022-09-26 DIAGNOSIS — Z8711 Personal history of peptic ulcer disease: Secondary | ICD-10-CM

## 2022-09-26 DIAGNOSIS — Z950 Presence of cardiac pacemaker: Secondary | ICD-10-CM

## 2022-09-26 DIAGNOSIS — R7401 Elevation of levels of liver transaminase levels: Secondary | ICD-10-CM | POA: Diagnosis present

## 2022-09-26 DIAGNOSIS — R579 Shock, unspecified: Secondary | ICD-10-CM | POA: Diagnosis present

## 2022-09-26 DIAGNOSIS — I3131 Malignant pericardial effusion in diseases classified elsewhere: Secondary | ICD-10-CM | POA: Diagnosis not present

## 2022-09-26 DIAGNOSIS — K6389 Other specified diseases of intestine: Secondary | ICD-10-CM | POA: Diagnosis not present

## 2022-09-26 DIAGNOSIS — R571 Hypovolemic shock: Secondary | ICD-10-CM | POA: Diagnosis present

## 2022-09-26 DIAGNOSIS — Z79899 Other long term (current) drug therapy: Secondary | ICD-10-CM

## 2022-09-26 DIAGNOSIS — E7800 Pure hypercholesterolemia, unspecified: Secondary | ICD-10-CM | POA: Diagnosis present

## 2022-09-26 DIAGNOSIS — J95821 Acute postprocedural respiratory failure: Secondary | ICD-10-CM | POA: Diagnosis not present

## 2022-09-26 DIAGNOSIS — R6521 Severe sepsis with septic shock: Secondary | ICD-10-CM | POA: Diagnosis not present

## 2022-09-26 DIAGNOSIS — I318 Other specified diseases of pericardium: Secondary | ICD-10-CM | POA: Diagnosis not present

## 2022-09-26 DIAGNOSIS — E039 Hypothyroidism, unspecified: Secondary | ICD-10-CM | POA: Diagnosis present

## 2022-09-26 DIAGNOSIS — J9811 Atelectasis: Secondary | ICD-10-CM | POA: Diagnosis not present

## 2022-09-26 DIAGNOSIS — R112 Nausea with vomiting, unspecified: Principal | ICD-10-CM

## 2022-09-26 DIAGNOSIS — K55049 Acute infarction of large intestine, extent unspecified: Secondary | ICD-10-CM | POA: Diagnosis not present

## 2022-09-26 DIAGNOSIS — R739 Hyperglycemia, unspecified: Secondary | ICD-10-CM | POA: Diagnosis present

## 2022-09-26 DIAGNOSIS — K2980 Duodenitis without bleeding: Secondary | ICD-10-CM | POA: Diagnosis present

## 2022-09-26 DIAGNOSIS — R55 Syncope and collapse: Secondary | ICD-10-CM

## 2022-09-26 DIAGNOSIS — Z7982 Long term (current) use of aspirin: Secondary | ICD-10-CM

## 2022-09-26 HISTORY — PX: LAPAROTOMY: SHX154

## 2022-09-26 LAB — COMPREHENSIVE METABOLIC PANEL
ALT: 13 U/L (ref 0–44)
ALT: 15 U/L (ref 0–44)
AST: 42 U/L — ABNORMAL HIGH (ref 15–41)
AST: 66 U/L — ABNORMAL HIGH (ref 15–41)
Albumin: 3.9 g/dL (ref 3.5–5.0)
Albumin: 3.2 g/dL — ABNORMAL LOW (ref 3.5–5.0)
Alkaline Phosphatase: 70 U/L (ref 38–126)
Alkaline Phosphatase: 46 U/L (ref 38–126)
Anion gap: 16 — ABNORMAL HIGH (ref 5–15)
Anion gap: 14 (ref 5–15)
BUN: 26 mg/dL — ABNORMAL HIGH (ref 8–23)
BUN: 42 mg/dL — ABNORMAL HIGH (ref 8–23)
CO2: 10 mmol/L — ABNORMAL LOW (ref 22–32)
CO2: 15 mmol/L — ABNORMAL LOW (ref 22–32)
Calcium: 8.9 mg/dL (ref 8.9–10.3)
Calcium: 7.5 mg/dL — ABNORMAL LOW (ref 8.9–10.3)
Chloride: 100 mmol/L (ref 98–111)
Chloride: 102 mmol/L (ref 98–111)
Creatinine, Ser: 1.62 mg/dL — ABNORMAL HIGH (ref 0.61–1.24)
Creatinine, Ser: 1.59 mg/dL — ABNORMAL HIGH (ref 0.61–1.24)
GFR, Estimated: 42 mL/min — ABNORMAL LOW (ref >60)
GFR, Estimated: 43 mL/min — ABNORMAL LOW (ref >60)
Glucose, Bld: 214 mg/dL — ABNORMAL HIGH (ref 70–99)
Glucose, Bld: 158 mg/dL — ABNORMAL HIGH (ref 70–99)
Potassium: 3.6 mmol/L (ref 3.5–5.1)
Potassium: 4.2 mmol/L (ref 3.5–5.1)
Sodium: 126 mmol/L — ABNORMAL LOW (ref 135–145)
Sodium: 131 mmol/L — ABNORMAL LOW (ref 135–145)
Total Bilirubin: 1.2 mg/dL (ref 0.3–1.2)
Total Bilirubin: 1.1 mg/dL (ref 0.3–1.2)
Total Protein: 6.6 g/dL (ref 6.5–8.1)
Total Protein: 5.4 g/dL — ABNORMAL LOW (ref 6.5–8.1)

## 2022-09-26 LAB — URINALYSIS, ROUTINE W REFLEX MICROSCOPIC
Bacteria, UA: NONE SEEN
Bilirubin Urine: NEGATIVE
Glucose, UA: NEGATIVE mg/dL
Ketones, ur: 5 mg/dL — AB
Leukocytes,Ua: NEGATIVE
Nitrite: NEGATIVE
Protein, ur: 100 mg/dL — AB
Specific Gravity, Urine: 1.031 — ABNORMAL HIGH (ref 1.005–1.030)
pH: 5 (ref 5.0–8.0)

## 2022-09-26 LAB — BASIC METABOLIC PANEL
Anion gap: 17 — ABNORMAL HIGH (ref 5–15)
BUN: 37 mg/dL — ABNORMAL HIGH (ref 8–23)
CO2: 14 mmol/L — ABNORMAL LOW (ref 22–32)
Calcium: 8.7 mg/dL — ABNORMAL LOW (ref 8.9–10.3)
Chloride: 100 mmol/L (ref 98–111)
Creatinine, Ser: 1.92 mg/dL — ABNORMAL HIGH (ref 0.61–1.24)
GFR, Estimated: 34 mL/min — ABNORMAL LOW (ref >60)
Glucose, Bld: 159 mg/dL — ABNORMAL HIGH (ref 70–99)
Potassium: 4 mmol/L (ref 3.5–5.1)
Sodium: 131 mmol/L — ABNORMAL LOW (ref 135–145)

## 2022-09-26 LAB — BLOOD GAS, ARTERIAL
Acid-base deficit: 13.5 mmol/L — ABNORMAL HIGH (ref 0.0–2.0)
Acid-base deficit: 11.6 mmol/L — ABNORMAL HIGH (ref 0.0–2.0)
Acid-base deficit: 13.1 mmol/L — ABNORMAL HIGH (ref 0.0–2.0)
Bicarbonate: 10.8 mmol/L — ABNORMAL LOW (ref 20.0–28.0)
Bicarbonate: 10.5 mmol/L — ABNORMAL LOW (ref 20.0–28.0)
Bicarbonate: 11.3 mmol/L — ABNORMAL LOW (ref 20.0–28.0)
FIO2: 60 %
MECHVT: 620 mL
Mechanical Rate: 20
O2 Saturation: 92.2 %
O2 Saturation: 100 %
O2 Saturation: 98.5 %
PEEP: 5 cmH2O
Patient temperature: 37
Patient temperature: 37
Patient temperature: 37
pCO2 arterial: 22 mmHg — ABNORMAL LOW (ref 32–48)
pCO2 arterial: <18 mmHg — CL (ref 32–48)
pCO2 arterial: 23 mmHg — ABNORMAL LOW (ref 32–48)
pH, Arterial: 7.3 — ABNORMAL LOW (ref 7.35–7.45)
pH, Arterial: 7.4 (ref 7.35–7.45)
pH, Arterial: 7.3 — ABNORMAL LOW (ref 7.35–7.45)
pO2, Arterial: 67 mmHg — ABNORMAL LOW (ref 83–108)
pO2, Arterial: 93 mmHg (ref 83–108)
pO2, Arterial: 186 mmHg — ABNORMAL HIGH (ref 83–108)

## 2022-09-26 LAB — GLUCOSE, CAPILLARY
Glucose-Capillary: 147 mg/dL — ABNORMAL HIGH (ref 70–99)
Glucose-Capillary: 147 mg/dL — ABNORMAL HIGH (ref 70–99)
Glucose-Capillary: 152 mg/dL — ABNORMAL HIGH (ref 70–99)
Glucose-Capillary: 157 mg/dL — ABNORMAL HIGH (ref 70–99)
Glucose-Capillary: 175 mg/dL — ABNORMAL HIGH (ref 70–99)
Glucose-Capillary: 178 mg/dL — ABNORMAL HIGH (ref 70–99)

## 2022-09-26 LAB — BLOOD GAS, VENOUS
Acid-base deficit: 16 mmol/L — ABNORMAL HIGH (ref 0.0–2.0)
Acid-base deficit: 6.9 mmol/L — ABNORMAL HIGH (ref 0.0–2.0)
Bicarbonate: 11.8 mmol/L — ABNORMAL LOW (ref 20.0–28.0)
Bicarbonate: 18.5 mmol/L — ABNORMAL LOW (ref 20.0–28.0)
O2 Saturation: 40.1 %
O2 Saturation: 60.4 %
Patient temperature: 37
Patient temperature: 37
pCO2, Ven: 34 mmHg — ABNORMAL LOW (ref 44–60)
pCO2, Ven: 36 mmHg — ABNORMAL LOW (ref 44–60)
pH, Ven: 7.15 — CL (ref 7.25–7.43)
pH, Ven: 7.32 (ref 7.25–7.43)
pO2, Ven: 35 mmHg (ref 32–45)
pO2, Ven: 39 mmHg (ref 32–45)

## 2022-09-26 LAB — GASTROINTESTINAL PANEL BY PCR, STOOL (REPLACES STOOL CULTURE)

## 2022-09-26 LAB — LACTIC ACID, PLASMA
Lactic Acid, Venous: 5.3 mmol/L (ref 0.5–1.9)
Lactic Acid, Venous: 5.9 mmol/L (ref 0.5–1.9)
Lactic Acid, Venous: 7.2 mmol/L (ref 0.5–1.9)
Lactic Acid, Venous: 3.5 mmol/L (ref 0.5–1.9)

## 2022-09-26 LAB — LIPASE, BLOOD: Lipase: 61 U/L — ABNORMAL HIGH (ref 11–51)

## 2022-09-26 LAB — PHOSPHORUS
Phosphorus: 4.3 mg/dL (ref 2.5–4.6)
Phosphorus: 6 mg/dL — ABNORMAL HIGH (ref 2.5–4.6)

## 2022-09-26 LAB — CBC WITH DIFFERENTIAL/PLATELET
Abs Immature Granulocytes: 0.11 10*3/uL — ABNORMAL HIGH (ref 0.00–0.07)
Basophils Absolute: 0.1 10*3/uL (ref 0.0–0.1)
Basophils Relative: 0 %
Eosinophils Absolute: 0 10*3/uL (ref 0.0–0.5)
Eosinophils Relative: 0 %
HCT: 41.4 % (ref 39.0–52.0)
Hemoglobin: 13.8 g/dL (ref 13.0–17.0)
Immature Granulocytes: 1 %
Lymphocytes Relative: 6 %
Lymphs Abs: 1.1 10*3/uL (ref 0.7–4.0)
MCH: 30 pg (ref 26.0–34.0)
MCHC: 33.3 g/dL (ref 30.0–36.0)
MCV: 90 fL (ref 80.0–100.0)
Monocytes Absolute: 1.1 10*3/uL — ABNORMAL HIGH (ref 0.1–1.0)
Monocytes Relative: 6 %
Neutro Abs: 14.9 10*3/uL — ABNORMAL HIGH (ref 1.7–7.7)
Neutrophils Relative %: 87 %
Platelets: 301 10*3/uL (ref 150–400)
RBC: 4.6 MIL/uL (ref 4.22–5.81)
RDW: 13.1 % (ref 11.5–15.5)
WBC: 17.3 10*3/uL — ABNORMAL HIGH (ref 4.0–10.5)
nRBC: 0 % (ref 0.0–0.2)

## 2022-09-26 LAB — CBC
HCT: 38.7 % — ABNORMAL LOW (ref 39.0–52.0)
Hemoglobin: 13.5 g/dL (ref 13.0–17.0)
MCH: 30.3 pg (ref 26.0–34.0)
MCHC: 34.9 g/dL (ref 30.0–36.0)
MCV: 86.8 fL (ref 80.0–100.0)
Platelets: 228 10*3/uL (ref 150–400)
RBC: 4.46 MIL/uL (ref 4.22–5.81)
RDW: 13.4 % (ref 11.5–15.5)
WBC: 15 10*3/uL — ABNORMAL HIGH (ref 4.0–10.5)
nRBC: 0 % (ref 0.0–0.2)

## 2022-09-26 LAB — TROPONIN I (HIGH SENSITIVITY)
Troponin I (High Sensitivity): 71 ng/L — ABNORMAL HIGH (ref <18)
Troponin I (High Sensitivity): 82 ng/L — ABNORMAL HIGH (ref <18)
Troponin I (High Sensitivity): 542 ng/L (ref <18)
Troponin I (High Sensitivity): 1621 ng/L (ref <18)

## 2022-09-26 LAB — T4, FREE: Free T4: 0.79 ng/dL (ref 0.61–1.12)

## 2022-09-26 LAB — SODIUM, URINE, RANDOM: Sodium, Ur: 18 mmol/L

## 2022-09-26 LAB — OSMOLALITY, URINE: Osmolality, Ur: 476 mOsm/kg (ref 300–900)

## 2022-09-26 LAB — OSMOLALITY: Osmolality: 310 mOsm/kg — ABNORMAL HIGH (ref 275–295)

## 2022-09-26 LAB — PROTIME-INR
INR: 2 — ABNORMAL HIGH (ref 0.8–1.2)
Prothrombin Time: 22.6 seconds — ABNORMAL HIGH (ref 11.4–15.2)

## 2022-09-26 LAB — TSH: TSH: 23.792 u[IU]/mL — ABNORMAL HIGH (ref 0.350–4.500)

## 2022-09-26 LAB — MAGNESIUM
Magnesium: 2.3 mg/dL (ref 1.7–2.4)
Magnesium: 2.6 mg/dL — ABNORMAL HIGH (ref 1.7–2.4)

## 2022-09-26 LAB — RESP PANEL BY RT-PCR (RSV, FLU A&B, COVID)  RVPGX2
Influenza A by PCR: NEGATIVE
Influenza B by PCR: NEGATIVE
Resp Syncytial Virus by PCR: NEGATIVE
SARS Coronavirus 2 by RT PCR: NEGATIVE

## 2022-09-26 LAB — PROCALCITONIN: Procalcitonin: <0.1 ng/mL

## 2022-09-26 LAB — SODIUM: Sodium: 132 mmol/L — ABNORMAL LOW (ref 135–145)

## 2022-09-26 LAB — BRAIN NATRIURETIC PEPTIDE: B Natriuretic Peptide: 231.2 pg/mL — ABNORMAL HIGH (ref 0.0–100.0)

## 2022-09-26 LAB — CORTISOL: Cortisol, Plasma: 85.5 ug/dL

## 2022-09-26 SURGERY — LAPAROTOMY, EXPLORATORY
Anesthesia: General | Site: Abdomen

## 2022-09-26 MED ORDER — SODIUM CHLORIDE 0.9 % IV SOLN
2.0000 g | Freq: Once | INTRAVENOUS | Status: AC
  Administered 2022-09-26: 2 g via INTRAVENOUS
  Filled 2022-09-26: qty 12.5

## 2022-09-26 MED ORDER — MIDAZOLAM HCL 2 MG/2ML IJ SOLN
INTRAMUSCULAR | Status: AC
  Filled 2022-09-26: qty 2

## 2022-09-26 MED ORDER — PROPOFOL 1000 MG/100ML IV EMUL
5.0000 ug/kg/min | INTRAVENOUS | Status: DC
  Administered 2022-09-26: 5 ug/kg/min via INTRAVENOUS
  Administered 2022-09-27: 25 ug/kg/min via INTRAVENOUS
  Administered 2022-09-27: 15 ug/kg/min via INTRAVENOUS
  Filled 2022-09-26 (×2): qty 100

## 2022-09-26 MED ORDER — FENTANYL BOLUS VIA INFUSION
25.0000 ug | INTRAVENOUS | Status: DC | PRN
  Administered 2022-09-26 (×2): 25 ug via INTRAVENOUS

## 2022-09-26 MED ORDER — CITALOPRAM HYDROBROMIDE 20 MG PO TABS: 20.0000 mg | ORAL_TABLET | Freq: Every day | ORAL | Status: DC

## 2022-09-26 MED ORDER — ALBUMIN HUMAN 25 % IV SOLN
25.0000 g | Freq: Two times a day (BID) | INTRAVENOUS | Status: AC
  Administered 2022-09-26 – 2022-09-27 (×4): 25 g via INTRAVENOUS
  Filled 2022-09-26 (×4): qty 100

## 2022-09-26 MED ORDER — FENTANYL CITRATE PF 50 MCG/ML IJ SOSY
25.0000 ug | PREFILLED_SYRINGE | Freq: Once | INTRAMUSCULAR | Status: AC
  Administered 2022-09-26: 25 ug via INTRAVENOUS

## 2022-09-26 MED ORDER — ROCURONIUM BROMIDE 100 MG/10ML IV SOLN
INTRAVENOUS | Status: DC | PRN
  Administered 2022-09-26 (×2): 50 mg via INTRAVENOUS

## 2022-09-26 MED ORDER — LACTATED RINGERS IV BOLUS: 500.0000 mL | Freq: Once | INTRAVENOUS | Status: DC

## 2022-09-26 MED ORDER — VANCOMYCIN HCL IN DEXTROSE 1-5 GM/200ML-% IV SOLN: 1000.0000 mg | Freq: Once | INTRAVENOUS | Status: DC

## 2022-09-26 MED ORDER — METRONIDAZOLE 500 MG/100ML IV SOLN
500.0000 mg | Freq: Once | INTRAVENOUS | Status: AC
  Administered 2022-09-26: 500 mg via INTRAVENOUS
  Filled 2022-09-26: qty 100

## 2022-09-26 MED ORDER — ENOXAPARIN SODIUM 40 MG/0.4ML IJ SOSY
40.0000 mg | PREFILLED_SYRINGE | INTRAMUSCULAR | Status: DC
  Administered 2022-09-26 – 2022-09-28 (×3): 40 mg via SUBCUTANEOUS
  Filled 2022-09-26 (×5): qty 0.4

## 2022-09-26 MED ORDER — DOCUSATE SODIUM 100 MG PO CAPS: 100.0000 mg | ORAL_CAPSULE | Freq: Two times a day (BID) | ORAL | Status: DC | PRN

## 2022-09-26 MED ORDER — ORAL CARE MOUTH RINSE: 15.0000 mL | OROMUCOSAL | Status: DC | PRN

## 2022-09-26 MED ORDER — ROCURONIUM BROMIDE 10 MG/ML (PF) SYRINGE
PREFILLED_SYRINGE | INTRAVENOUS | Status: AC
  Administered 2022-09-26: 60 mg via INTRAVENOUS
  Filled 2022-09-26: qty 10

## 2022-09-26 MED ORDER — HYDROCORTISONE SOD SUC (PF) 100 MG IJ SOLR
100.0000 mg | Freq: Three times a day (TID) | INTRAMUSCULAR | Status: DC
  Administered 2022-09-26 – 2022-09-27 (×4): 100 mg via INTRAVENOUS
  Filled 2022-09-26 (×4): qty 2

## 2022-09-26 MED ORDER — LACTATED RINGERS IV BOLUS
1000.0000 mL | Freq: Once | INTRAVENOUS | Status: AC
  Administered 2022-09-26: 1000 mL via INTRAVENOUS

## 2022-09-26 MED ORDER — VANCOMYCIN HCL 1750 MG/350ML IV SOLN
1750.0000 mg | Freq: Once | INTRAVENOUS | Status: AC
  Administered 2022-09-26: 1750 mg via INTRAVENOUS
  Filled 2022-09-26: qty 350

## 2022-09-26 MED ORDER — NOREPINEPHRINE 4 MG/250ML-% IV SOLN
0.0000 ug/min | INTRAVENOUS | Status: DC
  Administered 2022-09-26: 2 ug/min via INTRAVENOUS
  Filled 2022-09-26: qty 250

## 2022-09-26 MED ORDER — PROPOFOL 1000 MG/100ML IV EMUL
0.0000 ug/kg/min | INTRAVENOUS | Status: DC
  Filled 2022-09-26: qty 100

## 2022-09-26 MED ORDER — PANTOPRAZOLE SODIUM 40 MG IV SOLR
40.0000 mg | Freq: Once | INTRAVENOUS | Status: AC
  Administered 2022-09-26: 40 mg via INTRAVENOUS
  Filled 2022-09-26: qty 10

## 2022-09-26 MED ORDER — FENTANYL 2500MCG IN NS 250ML (10MCG/ML) PREMIX INFUSION
0.0000 ug/h | INTRAVENOUS | Status: DC
  Administered 2022-09-26 (×2): 75 ug/h via INTRAVENOUS
  Administered 2022-09-27: 100 ug/h via INTRAVENOUS
  Filled 2022-09-26: qty 250

## 2022-09-26 MED ORDER — SODIUM CHLORIDE 0.9 % IV BOLUS (SEPSIS)
1000.0000 mL | Freq: Once | INTRAVENOUS | Status: DC
  Administered 2022-09-26: 1000 mL via INTRAVENOUS

## 2022-09-26 MED ORDER — STERILE WATER FOR INJECTION IV SOLN
INTRAVENOUS | Status: DC
  Filled 2022-09-26 (×2): qty 150
  Filled 2022-09-26: qty 1000
  Filled 2022-09-26 (×4): qty 150

## 2022-09-26 MED ORDER — VASOPRESSIN 20 UNITS/100 ML INFUSION FOR SHOCK
0.0000 [IU]/min | INTRAVENOUS | Status: DC
  Administered 2022-09-26: 0.03 [IU]/min via INTRAVENOUS
  Administered 2022-09-26: 0.04 [IU]/min via INTRAVENOUS
  Administered 2022-09-27 – 2022-09-28 (×3): 0.03 [IU]/min via INTRAVENOUS
  Administered 2022-09-29: 0.04 [IU]/min via INTRAVENOUS
  Administered 2022-09-29: 0.03 [IU]/min via INTRAVENOUS
  Administered 2022-09-30 – 2022-10-01 (×3): 0.04 [IU]/min via INTRAVENOUS
  Filled 2022-09-26 (×12): qty 100

## 2022-09-26 MED ORDER — MIDAZOLAM HCL 2 MG/2ML IJ SOLN: 2.0000 mg | Freq: Once | INTRAMUSCULAR | Status: DC

## 2022-09-26 MED ORDER — ETOMIDATE 2 MG/ML IV SOLN
INTRAVENOUS | Status: AC
  Administered 2022-09-26: 20 mg via INTRAVENOUS
  Filled 2022-09-26: qty 10

## 2022-09-26 MED ORDER — SODIUM CHLORIDE 0.9 % IV SOLN: INTRAVENOUS | Status: DC | PRN

## 2022-09-26 MED ORDER — POLYETHYLENE GLYCOL 3350 17 G PO PACK: 17.0000 g | PACK | Freq: Every day | ORAL | Status: DC | PRN

## 2022-09-26 MED ORDER — PANTOPRAZOLE SODIUM 40 MG IV SOLR
40.0000 mg | Freq: Two times a day (BID) | INTRAVENOUS | Status: DC
  Administered 2022-09-26 – 2022-09-28 (×5): 40 mg via INTRAVENOUS
  Filled 2022-09-26 (×5): qty 10

## 2022-09-26 MED ORDER — SODIUM CHLORIDE 0.9 % IV SOLN
250.0000 mL | INTRAVENOUS | Status: DC
  Administered 2022-09-26 – 2022-09-28 (×2): 250 mL via INTRAVENOUS

## 2022-09-26 MED ORDER — ORAL CARE MOUTH RINSE
15.0000 mL | OROMUCOSAL | Status: DC
  Administered 2022-09-26 – 2022-09-28 (×21): 15 mL via OROMUCOSAL

## 2022-09-26 MED ORDER — PHENYLEPHRINE HCL-NACL 20-0.9 MG/250ML-% IV SOLN
0.0000 ug/min | INTRAVENOUS | Status: DC
  Administered 2022-09-26: 20 ug/min via INTRAVENOUS
  Filled 2022-09-26 (×2): qty 250

## 2022-09-26 MED ORDER — BUPIVACAINE-EPINEPHRINE (PF) 0.5% -1:200000 IJ SOLN
INTRAMUSCULAR | Status: AC
  Filled 2022-09-26: qty 30

## 2022-09-26 MED ORDER — FENTANYL CITRATE PF 50 MCG/ML IJ SOSY: 50.0000 ug | PREFILLED_SYRINGE | Freq: Once | INTRAMUSCULAR | Status: AC

## 2022-09-26 MED ORDER — LEVOTHYROXINE SODIUM 50 MCG PO TABS
125.0000 ug | ORAL_TABLET | Freq: Every day | ORAL | Status: DC
  Filled 2022-09-26: qty 3

## 2022-09-26 MED ORDER — CHLORHEXIDINE GLUCONATE CLOTH 2 % EX PADS
6.0000 | MEDICATED_PAD | Freq: Every day | CUTANEOUS | Status: DC
  Administered 2022-09-27 – 2022-09-30 (×5): 6 via TOPICAL

## 2022-09-26 MED ORDER — NOREPINEPHRINE 16 MG/250ML-% IV SOLN
0.0000 ug/min | INTRAVENOUS | Status: DC
  Administered 2022-09-26: 40 ug/min via INTRAVENOUS
  Administered 2022-09-26: 28 ug/min via INTRAVENOUS
  Administered 2022-09-27: 15 ug/min via INTRAVENOUS
  Administered 2022-09-28: 7 ug/min via INTRAVENOUS
  Administered 2022-09-29: 19 ug/min via INTRAVENOUS
  Administered 2022-09-29: 16 ug/min via INTRAVENOUS
  Administered 2022-09-30: 40 ug/min via INTRAVENOUS
  Administered 2022-09-30: 27 ug/min via INTRAVENOUS
  Administered 2022-10-01: 40 ug/min via INTRAVENOUS
  Filled 2022-09-26 (×11): qty 250

## 2022-09-26 MED ORDER — 0.9 % SODIUM CHLORIDE (POUR BTL) OPTIME
TOPICAL | Status: DC | PRN
  Administered 2022-09-26: 500 mL
  Administered 2022-09-26: 1000 mL
  Administered 2022-09-26: 500 mL

## 2022-09-26 MED ORDER — ROCURONIUM BROMIDE 10 MG/ML (PF) SYRINGE: 60.0000 mg | PREFILLED_SYRINGE | Freq: Once | INTRAVENOUS | Status: AC

## 2022-09-26 MED ORDER — IOHEXOL 350 MG/ML SOLN
100.0000 mL | Freq: Once | INTRAVENOUS | Status: AC | PRN
  Administered 2022-09-26: 100 mL via INTRAVENOUS

## 2022-09-26 MED ORDER — SODIUM CHLORIDE 0.9 % IV BOLUS (SEPSIS)
500.0000 mL | Freq: Once | INTRAVENOUS | Status: AC
  Administered 2022-09-26: 500 mL via INTRAVENOUS

## 2022-09-26 MED ORDER — ETOMIDATE 2 MG/ML IV SOLN: 20.0000 mg | Freq: Once | INTRAVENOUS | Status: AC

## 2022-09-26 MED ORDER — ASPIRIN 81 MG PO TBEC: 81.0000 mg | DELAYED_RELEASE_TABLET | Freq: Every day | ORAL | Status: DC

## 2022-09-26 MED ORDER — SODIUM CHLORIDE 0.9 % IV SOLN
2.0000 g | Freq: Two times a day (BID) | INTRAVENOUS | Status: DC
  Administered 2022-09-26 – 2022-09-29 (×6): 2 g via INTRAVENOUS
  Filled 2022-09-26 (×6): qty 12.5
  Filled 2022-09-26: qty 2

## 2022-09-26 MED ORDER — FENTANYL 2500MCG IN NS 250ML (10MCG/ML) PREMIX INFUSION
INTRAVENOUS | Status: AC
  Administered 2022-09-26: 50 ug/h
  Filled 2022-09-26: qty 250

## 2022-09-26 MED ORDER — METRONIDAZOLE 500 MG/100ML IV SOLN
500.0000 mg | Freq: Two times a day (BID) | INTRAVENOUS | Status: DC
  Administered 2022-09-26 – 2022-09-30 (×10): 500 mg via INTRAVENOUS
  Filled 2022-09-26 (×11): qty 100

## 2022-09-26 MED ORDER — INSULIN ASPART 100 UNIT/ML IJ SOLN
0.0000 [IU] | INTRAMUSCULAR | Status: DC
  Administered 2022-09-26: 3 [IU] via SUBCUTANEOUS
  Administered 2022-09-26: 2 [IU] via SUBCUTANEOUS
  Administered 2022-09-26: 3 [IU] via SUBCUTANEOUS
  Administered 2022-09-27 – 2022-09-30 (×18): 2 [IU] via SUBCUTANEOUS
  Administered 2022-09-30: 3 [IU] via SUBCUTANEOUS
  Administered 2022-09-30 – 2022-10-01 (×3): 2 [IU] via SUBCUTANEOUS
  Filled 2022-09-26 (×24): qty 1

## 2022-09-26 MED ORDER — CLOPIDOGREL BISULFATE 75 MG PO TABS: 75.0000 mg | ORAL_TABLET | Freq: Every day | ORAL | Status: DC

## 2022-09-26 MED ORDER — LEVOTHYROXINE SODIUM 100 MCG/5ML IV SOLN
100.0000 ug | Freq: Every day | INTRAVENOUS | Status: DC
  Administered 2022-09-27 – 2022-09-30 (×4): 100 ug via INTRAVENOUS
  Filled 2022-09-26 (×5): qty 5

## 2022-09-26 MED ORDER — FENTANYL 2500MCG IN NS 250ML (10MCG/ML) PREMIX INFUSION: 25.0000 ug/h | INTRAVENOUS | Status: DC

## 2022-09-26 MED ORDER — FENTANYL CITRATE PF 50 MCG/ML IJ SOSY
PREFILLED_SYRINGE | INTRAMUSCULAR | Status: AC
  Administered 2022-09-26: 50 ug via INTRAVENOUS
  Filled 2022-09-26: qty 1

## 2022-09-26 MED ORDER — SODIUM BICARBONATE 8.4 % IV SOLN
100.0000 meq | Freq: Once | INTRAVENOUS | Status: AC
  Administered 2022-09-26: 100 meq via INTRAVENOUS
  Filled 2022-09-26: qty 100

## 2022-09-26 SURGICAL SUPPLY — 72 items
APL PRP STRL LF DISP 70% ISPRP (MISCELLANEOUS) ×2
APPLIER CLIP 11 MED OPEN (CLIP)
APPLIER CLIP 13 LRG OPEN (CLIP)
APPLIER CLIP 5 13 M/L LIGAMAX5 (MISCELLANEOUS)
APR CLP LRG 13 20 CLIP (CLIP)
APR CLP MED 11 20 MLT OPN (CLIP)
APR CLP MED LRG 5 ANG JAW (MISCELLANEOUS)
BLADE CLIPPER SURG (BLADE) ×2 IMPLANT
CHLORAPREP W/TINT 26 (MISCELLANEOUS) ×2 IMPLANT
CLIP APPLIE 11 MED OPEN (CLIP) IMPLANT
CLIP APPLIE 13 LRG OPEN (CLIP) IMPLANT
CLIP APPLIE 5 13 M/L LIGAMAX5 (MISCELLANEOUS) IMPLANT
CLIP TI LARGE 6 (CLIP) IMPLANT
CLIP TI MEDIUM 6 (CLIP) IMPLANT
COVER BACK TABLE REUSABLE LG (DRAPES) ×2 IMPLANT
DRAPE C-SECTION (MISCELLANEOUS) ×2 IMPLANT
DRSG OPSITE POSTOP 4X10 (GAUZE/BANDAGES/DRESSINGS) IMPLANT
DRSG OPSITE POSTOP 4X8 (GAUZE/BANDAGES/DRESSINGS) IMPLANT
ELECT BLADE 6.5 EXT (BLADE) ×2 IMPLANT
ELECT CAUTERY BLADE 6.4 (BLADE) ×2 IMPLANT
ELECT CAUTERY BLADE TIP 2.5 (TIP) ×2
ELECT EZSTD 165MM 6.5IN (MISCELLANEOUS) ×2
ELECT REM PT RETURN 9FT ADLT (ELECTROSURGICAL) ×2
ELECTRODE CAUTERY BLDE TIP 2.5 (TIP) ×2 IMPLANT
ELECTRODE EZSTD 165MM 6.5IN (MISCELLANEOUS) ×2 IMPLANT
ELECTRODE REM PT RTRN 9FT ADLT (ELECTROSURGICAL) ×2 IMPLANT
GAUZE 4X4 16PLY ~~LOC~~+RFID DBL (SPONGE) ×2 IMPLANT
GLOVE ORTHO TXT STRL SZ7.5 (GLOVE) ×8 IMPLANT
GOWN STRL REUS W/ TWL LRG LVL3 (GOWN DISPOSABLE) ×8 IMPLANT
GOWN STRL REUS W/ TWL XL LVL3 (GOWN DISPOSABLE) ×4 IMPLANT
GOWN STRL REUS W/TWL LRG LVL3 (GOWN DISPOSABLE) ×8
GOWN STRL REUS W/TWL XL LVL3 (GOWN DISPOSABLE) ×4
KIT OSTOMY DRAINABLE 3.25 STR (WOUND CARE) ×1 IMPLANT
KIT TURNOVER KIT A (KITS) ×2 IMPLANT
LIGASURE IMPACT 36 18CM CVD LR (INSTRUMENTS) ×1 IMPLANT
LIGASURE LAP MARYLAND 5MM 37CM (ELECTROSURGICAL) IMPLANT
LIGASURE VESSEL 5MM BLUNT TIP (ELECTROSURGICAL) IMPLANT
MANIFOLD NEPTUNE II (INSTRUMENTS) ×2 IMPLANT
NS IRRIG 1000ML POUR BTL (IV SOLUTION) ×2 IMPLANT
PACK BASIN MAJOR ARMC (MISCELLANEOUS) ×2 IMPLANT
PACK COLON CLEAN CLOSURE (MISCELLANEOUS) ×1 IMPLANT
PACK LAP CHOLECYSTECTOMY (MISCELLANEOUS) ×2 IMPLANT
RELOAD PROXIMATE 75MM BLUE (ENDOMECHANICALS) ×2 IMPLANT
RELOAD STAPLE 75 3.8 BLU REG (ENDOMECHANICALS) IMPLANT
RELOAD STAPLER LINEAR PROX 30 (STAPLE) IMPLANT
SET TRI-LUMEN FLTR TB AIRSEAL (TUBING) IMPLANT
SPONGE T-LAP 18X18 ~~LOC~~+RFID (SPONGE) ×4 IMPLANT
STAPLER GUN LINEAR PROX 60 (STAPLE) ×1 IMPLANT
STAPLER PROXIMATE 75MM BLUE (STAPLE) ×2 IMPLANT
STAPLER RELOAD LINEAR PROX 30 (STAPLE)
STAPLER RELOADABLE 30 BLU REG (STAPLE) ×1 IMPLANT
STAPLER SKIN PROX 35W (STAPLE) ×2 IMPLANT
SUT CHROMIC 2 0 SH (SUTURE) ×1 IMPLANT
SUT ETHIBOND 2-0 (SUTURE) IMPLANT
SUT PDS #1 CTX NDL (SUTURE) ×2 IMPLANT
SUT PDS AB 0 CT1 27 (SUTURE) ×2 IMPLANT
SUT PDS AB 1 CT  36 (SUTURE)
SUT PDS AB 1 CT 36 (SUTURE) ×3 IMPLANT
SUT PROLENE 2 0 SH DA (SUTURE) IMPLANT
SUT SILK 2 0 (SUTURE)
SUT SILK 2-0 30XBRD TIE 12 (SUTURE) ×1 IMPLANT
SUT SILK 3-0 (SUTURE) ×2 IMPLANT
SUT VIC AB 3-0 SH 27 (SUTURE) ×4
SUT VIC AB 3-0 SH 27X BRD (SUTURE) ×4 IMPLANT
SUT VICRYL 2 0 18  UND BR (SUTURE)
SUT VICRYL 2 0 18 UND BR (SUTURE) ×1 IMPLANT
SUT VICRYL 2-0 54IN ABS (SUTURE) IMPLANT
SUT VICRYL 3-0 CR8 SH (SUTURE) ×1 IMPLANT
SYR BULB IRRIG 60ML STRL (SYRINGE) ×2 IMPLANT
SYS LAPSCP GELPORT 120MM (MISCELLANEOUS) ×2
SYSTEM LAPSCP GELPORT 120MM (MISCELLANEOUS) ×2 IMPLANT
TRAP FLUID SMOKE EVACUATOR (MISCELLANEOUS) ×2 IMPLANT

## 2022-09-26 NOTE — Consult Note (Signed)
PHARMACY CONSULT NOTE - ELECTROLYTES  Pharmacy Consult for Electrolyte Monitoring and Replacement   Recent Labs: Potassium (mmol/L)  Date Value  10/06/2022 3.6   Magnesium (mg/dL)  Date Value  10/14/2022 2.3   Calcium (mg/dL)  Date Value  10/18/2022 8.9   Albumin (g/dL)  Date Value  09/25/2022 3.9   Phosphorus (mg/dL)  Date Value  10/15/2022 4.3   Sodium (mmol/L)  Date Value  09/24/2022 126 (L)   Corrected Ca: 9.0 mg/dL  Assessment  ABRAM SAX is a 82 y.o. male presenting with sepsis. PMH significant for anemia, anxiety, depression, CAD, ICM s/p PCI, HFrEF (2018 EF 45%), CVA, HTN, HLD, GERD, hypothyroidism. Pharmacy has been consulted to monitor and replace electrolytes.  Diet: NPO MIVF: Bicarb @ 100 mL/hr  Goal of Therapy: Electrolytes WNL  Plan:  Sodium: 126 >> 131, improving, continue to monitor Potassium: 3.6 >> 4.0, no replacement needed Magnesium: 2.3, no replacement needed Phosphorus: 4.3, no replacement needed Check BMP with AM labs  Thank you for allowing pharmacy to be a part of this patient's care.  Gretel Acre, PharmD PGY1 Pharmacy Resident 09/28/2022 9:26 AM

## 2022-09-26 NOTE — Procedures (Signed)
Intubation Procedure Note  Daniel Austin  [id number]  06-Apr-1941  Date:10/03/2022  Time:11:26 AM   Provider Performing:Bailei Buist Jeneen Rinks    Procedure: Intubation (72094)  Indication(s) Respiratory Failure  Consent Risks of the procedure as well as the alternatives and risks of each were explained to the patient and/or caregiver.  Consent for the procedure was obtained and is signed in the bedside chart   Anesthesia Etomidate, Fentanyl, and Rocuronium   Time Out Verified patient identification, verified procedure, site/side was marked, verified correct patient position, special equipment/implants available, medications/allergies/relevant history reviewed, required imaging and test results available.   Sterile Technique Usual hand hygeine, masks, and gloves were used   Procedure Description Patient positioned in bed supine.  Sedation given as noted above.  Patient was intubated with endotracheal tube using Glidescope.  View was Grade 2 only posterior commissure .  Number of attempts was 1.  Colorimetric CO2 detector was consistent with tracheal placement.   Complications/Tolerance None; patient tolerated the procedure well. Chest X-ray is ordered to verify placement.   EBL Minimal   Specimen(s) None  Dr.Gonzales present and available throughout entire time of procedure.   Tonye Royalty ACNP-BC

## 2022-09-26 NOTE — Sepsis Progress Note (Signed)
Notified provider of need to order repeat lactic acid. ° °

## 2022-09-26 NOTE — Progress Notes (Signed)
*  PRELIMINARY RESULTS* Echocardiogram 2D Echocardiogram has been attempted. Patient is unavailable at 0900 and is in the OR at 1500.   [name] 10/24/2022, 3:16 PM

## 2022-09-26 NOTE — H&P (Signed)
NAME:  Daniel Austin, MRN:  [id number], DOB:  [birth year], LOS: 0 ADMISSION DATE:  10/03/2022, CONSULTATION DATE:  10/13/2022 REFERRING MD:  Dr. Beather Arbour, CHIEF COMPLAINT:   Sepsis  History of Present Illness:  82 yo M presenting to [facility] ED from home via EMS on 09/30/2022 with a chief complaint of sepsis alert. History provided by daughter and spouse bedside as patient is altered. Patient was in his normal state of health until he took a laxative dose 2 days ago then had uncontrollable diarrhea.  Spouse also described nausea with dry heaves and some mild emesis with poor p.o. intake. She denied fever, chest pain & shortness of breath.  They reported he fell and when the daughter arrived to assist her mother he appeared tremulous and diaphoretic. Family denies EtOH use/ NSAID use. EMS reported being called out due to nausea/vomiting and diarrhea, and the patient had a syncopal episode when first responders stood him up.  EMS reported hypotension in the field patient received a liter NS bolus and was given 4 mg of IV Zofran prior to arrival.  ED course: Upon arrival patient confused and severely hypotensive with tachypnea.  Sepsis protocol initiated.  Labs significant for hyponatremia (possible acute on chronic), hyperglycemia, AKI, AGMA, transaminitis, leukocytosis with lactic acidosis and elevated troponin.  Imaging suspicious for UTI, also revealing severe gastritis.  Post total 2.5 L IV fluid resuscitation between EMS and ED patient remained hypotensive requiring low-dose peripheral vasopressor support. Medications given: Cefepime, Flagyl and vancomycin, IV contrast, 40 mg Protonix,  1.500 mL NS, Levophed drip initiated Initial Vitals: 96.8, 21, 81, 53/42 & 92% on room air Significant labs: (Labs/ Imaging personally reviewed) I, Domingo Pulse [place], AGACNP-BC, personally viewed and interpreted this ECG. EKG Interpretation: Date: 10/23/2022, EKG Time: 00: 44, Rate: 93, Rhythm: NSR, QRS Axis: LAD,  Intervals: LBBB, ST/T Wave abnormalities: Nonspecific T wave inversions, Narrative Interpretation: NSR with LBBB and nonspecific T wave inversions (unchanged from 2022) Chemistry: Na+: 126, K+: 3.6, BUN/Cr.: 26/ 1.62, Serum CO2/ AG: 10/16, Lipase: 61, AST: 42 Hematology: WBC: 17.3, Hgb: 13.8,  Troponin: 71, Lactic/ PCT: 5.3/ pending, COVID-19 & Influenza A/B: negative  ABG: 7.30/ 22/67/10.8  CXR 10/24/2022: Low lung volumes without active cardiopulmonary disease CT head without contrast 10/10/2022: No acute intracranial abnormality.  Chronic right frontal parietal and right parietoccipital infarcts.  Generalized cerebral atrophy and microvascular disease changes of the supratentorial brain. CT angio chest PE 10/20/2022: No evidence of pulmonary embolism or other acute intrathoracic process.  Marked severity coronary artery calcification, small anterior pericardial effusion. CT abdomen and pelvis with contrast 09/24/2022: Findings consistent with marked severity gastritis and/or duodenitis.  Mild to moderate severity diffuse enteritis.  Large amount of stool within the mid to distal sigmoid colon and rectum consistent with constipation.  Sigmoid diverticulosis, 3.8 cm x 3.7 cm x 3.8 cm infrarenal AAA.  7 cm x 4.8 cm left inguinal hernia.  Mild diffuse urinary bladder wall thickening which may represent cystitis.   PCCM consulted for admission due to circulatory shock secondary to hypovolemia and suspected sepsis in the setting of gastritis and possible UTI requiring peripheral vasopressor support.  Pertinent  Medical History  Anemia Anxiety & depression CAD ICM status post PCI HFrEF (echo 2018 LVEF 45%) CVA Heart block status post PPM 12/08 Hypertension Hypercholesterolemia GERD Hypothyroidism Gastric ulcer status post-repair Significant Hospital Events: Including procedures, antibiotic start and stop dates in addition to other pertinent events   10/19/2022: Admit to ICU with circulatory shock  secondary  to hypovolemia and suspected sepsis in the setting of gastritis and possible UTI requiring peripheral vasopressor support.  Interim History / Subjective:  Patient alert and responsive but fidgety and intermittently confused.  Spouse and daughter bedside.  Plan of care discussed and all questions and concerns answered at this time.  Objective   Blood pressure 96/85, pulse (!) 28, temperature (!) 96.8 F (36 C), temperature source Rectal, resp. rate (!) 38, height 6' (1.829 m), weight 79.4 kg, SpO2 94 %.        Intake/Output Summary (Last 24 hours) at 09/29/2022 0342 Last data filed at 10/13/2022 0034 Gross per 24 hour  Intake 800 ml  Output --  Net 800 ml   Filed Weights   10/21/2022 0117  Weight: 79.4 kg    Examination: General: Adult male, acutely ill, lying in bed, NAD HEENT: MM pink/moist, anicteric, atraumatic, neck supple Neuro: A&O x 2-3, able to follow commands, PERRL +3, MAE CV: s1s2 RRR, NSR with LBBB on monitor, no r/m/g Pulm: Regular, non labored on room air, breath sounds clear/diminished-BUL & diminished-BLL GI: soft, rounded, non tender, bs x 4 Skin: no rashes/lesions noted Extremities: warm/dry, pulses + 2 R/P, no edema noted  Resolved Hospital Problem list     Assessment & Plan:  Circulatory shock multifocal secondary to suspected hypovolemia & sepsis Suspected Sepsis due to suspected UTI and/or suspected infectious gastritis PMHx: gastric ulcer s/p remote repair Initial interventions/workup included: 2.5 L of NS/LR & Cefepime/ Vancomycin/ Metronidazole - PPI BID - Supplemental oxygen as needed, to maintain SpO2 > 90% - f/u cultures, trend lactic/ PCT - Daily CBC, monitor WBC/ fever curve - IV antibiotics: cefepime & flagyl - Continue vasopressors to maintain MAP< 65: norepinephrine - consult to GI, appreciate input  Acute Kidney Injury secondary to hypovolemia & suspected sepsis AGMA Baseline Cr: 0.87, Cr on admission:1.62 - Strict I/O's: alert  provider if UOP < 0.5 mL/kg/hr - gentle IVF hydration  - Daily BMP, replace electrolytes PRN - Avoid nephrotoxic agents as able, ensure adequate renal perfusion - due to worsening metabolic acidosis sodium bicarbonate supplementation and drip initiation  Elevated Troponin secondary to N-STEMI vs demand ischemia Small pericardial effusion PMHx: CAD, HFrEF, HTN - Trend troponins  - Echocardiogram ordered - continuous cardiac monitoring - outpatient regimen on hold due to hypotension, consider restarting as patient stabilizes: metoprolol, amlodipine, lisnopril - Consult cardiology if needed  Mild Transaminitis secondary to circulatory shock in the setting of gastritis - Trend hepatic function - hold Crestor until LFT's normalize - avoid hepatotoxic agents - GI consulted as above  Hyperglycemia Hemoglobin A1C: 6.1 - Monitor CBG Q 4 hours - SSI moderate dosing - target range while in ICU: 140-180 - follow ICU hyper/hypo-glycemia protocol  Mild Chronic Hypovolemic - Hypotonic Hyponatremia suspect secondary to ACE inhibitor Recent outpatient labs show Ur Na+ 44, osmolality 273 Baseline Na+: 125-131 (December 2023), Na+ on admission: 126, -recheck Na+ @ 8 a post IVF resuscitation and bicarb drip initiation, serum & Ur osmolality, Ur Na+ then if stable Q 6 h sodium checks x 2 - Target Na+: 134, careful not to raise Na+ level more then: 0.5-1 mmol/h OR 8-10 mmol/L in first 24 hours - Consider Nephrology consult, appreciate input  Hypothyroidism - continue outpatient Synthroid - f/u TSH  Acute Encephalopathy suspect metabolic  PMHx: anxiety & depression, CVA - supportive care - continue outpatient: ASA, citalopram, clopidogrel - consider ativan PRN if patient becomes agitated  Best Practice (right click and "Reselect all SmartList Selections"  daily)  Diet/type: NPO w/ oral meds DVT prophylaxis: LMWH GI prophylaxis: PPI Lines: N/A Foley:  N/A Code Status:  full code Last date  of multidisciplinary goals of care discussion [10/12/2022]  Labs   CBC: Recent Labs  Lab 10/03/2022 0101  WBC 17.3*  NEUTROABS 14.9*  HGB 13.8  HCT 41.4  MCV 90.0  PLT 893    Basic Metabolic Panel: Recent Labs  Lab 10/19/2022 0101  NA 126*  K 3.6  CL 100  CO2 10*  GLUCOSE 214*  BUN 26*  CREATININE 1.62*  CALCIUM 8.9   GFR: Estimated Creatinine Clearance: 38.6 mL/min (A) (by C-G formula based on SCr of 1.62 mg/dL (H)). Recent Labs  Lab 10/21/2022 0101  WBC 17.3*  LATICACIDVEN 5.3*    Liver Function Tests: Recent Labs  Lab 10/10/2022 0101  AST 42*  ALT 13  ALKPHOS 70  BILITOT 1.2  PROT 6.6  ALBUMIN 3.9   Recent Labs  Lab 10/12/2022 0101  LIPASE 61*   No results for input(s): "AMMONIA" in the last 168 hours.  ABG    Component Value Date/Time   PHART 7.3 (L) 10/10/2022 0134   PCO2ART 22 (L) 09/28/2022 0134   PO2ART 67 (L) 09/30/2022 0134   HCO3 10.8 (L) 09/28/2022 0134   ACIDBASEDEF 13.5 (H) 10/23/2022 0134   O2SAT 92.2 09/24/2022 0134     Coagulation Profile: No results for input(s): "INR", "PROTIME" in the last 168 hours.  Cardiac Enzymes: No results for input(s): "CKTOTAL", "CKMB", "CKMBINDEX", "TROPONINI" in the last 168 hours.  HbA1C: Hgb A1c MFr Bld  Date/Time Value Ref Range Status  08/29/2022 10:16 AM 6.1 4.6 - 6.5 % Final    Comment:    Glycemic Control Guidelines for People with Diabetes:Non Diabetic:  <6%Goal of Therapy: <7%Additional Action Suggested:  >8%   03/14/2021 11:51 AM 6.1 4.6 - 6.5 % Final    Comment:    Glycemic Control Guidelines for People with Diabetes:Non Diabetic:  <6%Goal of Therapy: <7%Additional Action Suggested:  >8%     CBG: No results for input(s): "GLUCAP" in the last 168 hours.  Review of Systems: Positives in BOLD  Gen: Denies fever, chills, weight change, fatigue, night sweats HEENT: Denies blurred vision, double vision, hearing loss, tinnitus, sinus congestion, rhinorrhea, sore throat, neck stiffness,  dysphagia PULM: Denies shortness of breath, cough, sputum production, hemoptysis, wheezing CV: Denies chest pain, edema, orthopnea, paroxysmal nocturnal dyspnea, palpitations GI: Denies abdominal pain, nausea, vomiting, diarrhea, hematochezia, melena, constipation, change in bowel habits GU: Denies dysuria, hematuria, polyuria, oliguria, urethral discharge Endocrine: Denies hot or cold intolerance, polyuria, polyphagia or appetite change Derm: Denies rash, dry skin, scaling or peeling skin change Heme: Denies easy bruising, bleeding, bleeding gums Neuro: Denies headache, numbness, weakness, slurred speech, loss of memory or consciousness  Past Medical History:  He,  has a past medical history of Anemia, Anxiety, Arthritis, CAD (coronary artery disease), Cardiomyopathy ([place]), Carotid artery stenosis, CHF (congestive heart failure) ([place]), Depression, Fractures, multiple, GERD (gastroesophageal reflux disease), Heart block, Hypercholesterolemia, Hyperglycemia, Hypertension, Hypothyroidism, Knee pain, Presence of permanent cardiac pacemaker, Shortness of breath dyspnea, Stroke ([place]), Syncope, and Ulcer of gastric fundus.   Surgical History:   Past Surgical History:  Procedure Laterality Date   CARDIAC CATHETERIZATION     CATARACT EXTRACTION W/PHACO Right 03/07/2021   Procedure: CATARACT EXTRACTION PHACO AND INTRAOCULAR LENS PLACEMENT (IOC) RIGHT 8.69 00:55.2;  Surgeon: Birder Robson, MD;  Location: [facility];  Service: Ophthalmology;  Laterality: Right;   COLONOSCOPY N/A 07/06/2021  Procedure: COLONOSCOPY;  Surgeon: Annamaria Helling, DO;  Location: [facility] ENDOSCOPY;  Service: Gastroenterology;  Laterality: N/A;   CORONARY ANGIOPLASTY     stents x 3   heart stint  11/04/2014   [facility]   HERNIA REPAIR     inguinal   INSERT / REPLACE / REMOVE PACEMAKER     JOINT REPLACEMENT     right knee   KNEE ARTHROPLASTY Right 02/08/2016   Procedure: COMPUTER ASSISTED TOTAL KNEE  ARTHROPLASTY;  Surgeon: Dereck Leep, MD;  Location: [facility] ORS;  Service: Orthopedics;  Laterality: Right;   KNEE ARTHROPLASTY Left 06/27/2016   Procedure: COMPUTER ASSISTED TOTAL KNEE ARTHROPLASTY;  Surgeon: Dereck Leep, MD;  Location: [facility] ORS;  Service: Orthopedics;  Laterality: Left;   PACEMAKER INSERTION  08/2007   PACEMAKER INSERTION N/A 11/16/2015   Procedure: INSERTION PACEMAKER/ PACEMAKER CHANGE OUT;  Surgeon: Isaias Cowman, MD;  Location: [facility] ORS;  Service: Cardiovascular;  Laterality: N/A;   PERCUTANEOUS PLACEMENT INTRAVASCULAR STENT CERVICAL CAROTID ARTERY     twice, right carotid artery     Social History:   reports that he has never smoked. He has never used smokeless tobacco. He reports that he does not drink alcohol and does not use drugs.   Family History:  His family history includes Cancer in his sister and sister; Colon polyps in his father; Diabetes in his sister; Heart disease in his father and mother; Thyroid disease in his father, sister, and sister. There is no history of Colon cancer or Prostate cancer.   Allergies Allergies  Allergen Reactions   Beta Adrenergic Blockers Other (See Comments)    Second Degree Heart Block   Lipitor [Atorvastatin] Other (See Comments)    Myalgia      Home Medications  Prior to Admission medications   Medication Sig Start Date End Date Taking? Authorizing Provider  amLODipine (NORVASC) 5 MG tablet TAKE 1 TABLET (5 MG TOTAL) BY MOUTH DAILY. 08/28/21  Yes Einar Pheasant, MD  Aspirin 81 MG EC tablet Take 81 mg by mouth daily.   Yes [provider]  citalopram (CELEXA) 20 MG tablet TAKE 1 TABLET BY MOUTH EVERY DAY 05/31/22  Yes Einar Pheasant, MD  clopidogrel (PLAVIX) 75 MG tablet TAKE 1 TABLET BY MOUTH EVERY DAY 07/12/22  Yes Einar Pheasant, MD  levothyroxine (SYNTHROID) 125 MCG tablet TAKE 1 TABLET BY MOUTH EVERY DAY BEFORE BREAKFAST 02/26/22  Yes Einar Pheasant, MD  lisinopril (ZESTRIL) 40 MG tablet TAKE 1  TABLET BY MOUTH EVERY DAY 03/19/22  Yes Einar Pheasant, MD  metoprolol tartrate (LOPRESSOR) 25 MG tablet TAKE 1 TABLET BY MOUTH TWICE A DAY 08/27/22  Yes Einar Pheasant, MD  pantoprazole (PROTONIX) 40 MG tablet TAKE 1 TABLET BY MOUTH EVERY DAY 05/04/22  Yes Einar Pheasant, MD  rosuvastatin (CRESTOR) 20 MG tablet TAKE 1 TABLET BY MOUTH EVERY DAY AT NIGHT 05/04/22  Yes Einar Pheasant, MD  acetaminophen (TYLENOL) 500 MG tablet Take 1,000 mg by mouth every 6 (six) hours as needed.    [provider]  fexofenadine (ALLEGRA) 180 MG tablet Take 180 mg by mouth daily. As needed    [provider]  LORazepam (ATIVAN) 1 MG tablet 1/2-1 TABLET AT BEDTIME AS NEEDED . 09/11/22   Einar Pheasant, MD  nitroGLYCERIN (NITROSTAT) 0.4 MG SL tablet Place 0.4 mg under the tongue every 5 (five) minutes as needed. If a third tablet is needed please call 911. Patient not taking: Reported on 10/10/2022    [provider]  triamcinolone (NASACORT) 55 MCG/ACT AERO nasal inhaler Place into the nose. Patient not taking: Reported on 09/28/2022 08/28/18   [provider]  triamcinolone cream (KENALOG) 0.1 % Apply 1 application topically 2 (two) times daily. Patient not taking: Reported on 10/12/2022 09/25/17   Einar Pheasant, MD     Critical care time: 23 minutes       Venetia Night, AGACNP-BC Acute Care Nurse Practitioner [facility]   [phone] / [phone] Please see Amion for pager details.

## 2022-09-26 NOTE — Progress Notes (Signed)
PHARMACY -  BRIEF ANTIBIOTIC NOTE   Pharmacy has received consult(s) for Vancomycin , Cefepime  from an ED provider.  The patient's profile has been reviewed for ht/wt/allergies/indication/available labs.    One time order(s) placed for Vancomycin 1750 mg IV X 1 and Cefepime 2 gm IV X 1.   Further antibiotics/pharmacy consults should be ordered by admitting physician if indicated.                       Thank you, Rusti Arizmendi D 10/07/2022  1:22 AM

## 2022-09-26 NOTE — Progress Notes (Signed)
CODE SEPSIS - PHARMACY COMMUNICATION  **Broad Spectrum Antibiotics should be administered within 1 hour of Sepsis diagnosis**  Time Code Sepsis Called/Page Received: 1/3 @ 0113   Antibiotics Ordered: Cefepime, Vanc, metronidazole   Time of 1st antibiotic administration: metronidazole 500 mg IV X 1 on 1/3 @ 0141   Additional action taken by pharmacy:   If necessary, Name of Provider/Nurse Contacted:     Briston Lax D ,PharmD Clinical Pharmacist  10/16/2022  1:49 AM

## 2022-09-26 NOTE — Transfer of Care (Addendum)
Immediate Anesthesia Transfer of Care Note  Patient: Daniel Austin  Procedure(s) Performed: EXPLORATORY LAPAROTOMY; SIGMOID COLECTOMY WITH END COLOSTOMY CREATION (Abdomen)  Patient Location: ICU  Anesthesia Type:General  Level of Consciousness: sedated  Airway & Oxygen Therapy: Patient remains intubated per anesthesia plan and Patient placed on Ventilator (see vital sign flow sheet for setting)  Post-op Assessment: Report given to RN, Post -op Vital signs reviewed and stable, and stable VS on pressors  Post vital signs: Reviewed and stable  Last Vitals:  Vitals Value Taken Time  BP 135/71   Temp 37 C 10/15/2022 1552  Pulse 114 10/12/2022 1552  Resp 20 10/06/2022 1552  SpO2 97 % 10/03/2022 1552  Vitals shown include unvalidated device data.  Last Pain:  Vitals:   10/15/2022 0740  TempSrc:   PainSc: 0-No pain         Complications: No notable events documented.

## 2022-09-26 NOTE — Anesthesia Preprocedure Evaluation (Signed)
Anesthesia Evaluation  Patient identified by MRN, date of birth, ID band Patient awake    Reviewed: Allergy & Precautions, H&P , NPO status , Patient's Chart, lab work & pertinent test results, reviewed documented beta blocker date and time   History of Anesthesia Complications Negative for: history of anesthetic complications  Airway Mallampati: Intubated  TM Distance: >3 FB Neck ROM: full    Dental  (+) Chipped, Poor Dentition, Dental Advidsory Given   Pulmonary neg pulmonary ROS   Pulmonary exam normal breath sounds clear to auscultation       Cardiovascular Exercise Tolerance: Good hypertension, (-) angina + CAD, + Cardiac Stents, + Peripheral Vascular Disease and +CHF  (-) Past MI Normal cardiovascular exam+ dysrhythmias + pacemaker (-) Valvular Problems/Murmurs Rhythm:regular Rate:Normal     Neuro/Psych neg Seizures PSYCHIATRIC DISORDERS Anxiety Depression    CVA    GI/Hepatic Neg liver ROS, PUD,GERD  ,,  Endo/Other  neg diabetesHypothyroidism    Renal/GU negative Renal ROS  negative genitourinary   Musculoskeletal   Abdominal   Peds  Hematology  (+) Blood dyscrasia, anemia   Anesthesia Other Findings Past Medical History: No date: Anemia No date: Anxiety No date: Arthritis No date: CAD (coronary artery disease) No date: Cardiomyopathy ([place]) No date: Carotid artery stenosis     Comment:  high-grade symptomatic right sided s/p stent twice No date: CHF (congestive heart failure) ([facility])     Comment:  chronic movement fluid and history of CHF he noticed in               the  No date: Depression No date: Fractures, multiple     Comment:  Metal rod in left foot No date: GERD (gastroesophageal reflux disease) No date: Heart block No date: Hypercholesterolemia No date: Hyperglycemia No date: Hypertension No date: Hypothyroidism No date: Knee pain No date: Presence of permanent cardiac pacemaker      Comment:  11/16/2015 [employer] ADR01,   (2008 St Jude) No date: Shortness of breath dyspnea No date: Stroke [facility])     Comment:  clot in neck broke off left side weakness 2008  resolved No date: Syncope     Comment:  second degree heart block s/p St Jude's pacemaker               placement 12/08 with his history is limited with any No date: Ulcer of gastric fundus   Reproductive/Obstetrics negative OB ROS                             Anesthesia Physical Anesthesia Plan  ASA: 5  Anesthesia Plan: General   Post-op Pain Management:    Induction: Intravenous  PONV Risk Score and Plan: 2  Airway Management Planned: Oral ETT  Additional Equipment: Arterial line and CVP  Intra-op Plan:   Post-operative Plan: Post-operative intubation/ventilation  Informed Consent: I have reviewed the patients History and Physical, chart, labs and discussed the procedure including the risks, benefits and alternatives for the proposed anesthesia with the patient or authorized representative who has indicated his/her understanding and acceptance.     Dental Advisory Given  Plan Discussed with: Anesthesiologist, CRNA and Surgeon  Anesthesia Plan Comments: (Patient is in critical condition.  Discussed that patient may not survive the procedure.  Family understood and wanted to proceed.)        Anesthesia Quick Evaluation

## 2022-09-26 NOTE — ED Notes (Signed)
Notd b/p. 1L NS continued. MD Beather Arbour notified.

## 2022-09-26 NOTE — Sepsis Progress Note (Signed)
Elink monitoring for the code sepsis protocol.  

## 2022-09-26 NOTE — Progress Notes (Signed)
Assisted provider with rij triple lumen placement and rt radial arterial line placement.  Timeout performed immediately prior to procedures.

## 2022-09-26 NOTE — Procedures (Signed)
Central Venous Catheter Insertion Procedure Note  DEQUANDRE CORDOVA  [id number]  [birth year]  Date:10/16/2022  Time:11:28 AM   Provider Performing:Analisa Sledd Jeneen Rinks   Procedure: Insertion of Non-tunneled Central Venous [phone]) with US guidance (81157)   Indication(s) Medication administration  Consent Risks of the procedure as well as the alternatives and risks of each were explained to the patient and/or caregiver.  Consent for the procedure was obtained and is signed in the bedside chart  Anesthesia Topical only with 1% lidocaine   Timeout Verified patient identification, verified procedure, site/side was marked, verified correct patient position, special equipment/implants available, medications/allergies/relevant history reviewed, required imaging and test results available.  Sterile Technique Maximal sterile technique including full sterile barrier drape, hand hygiene, sterile gown, sterile gloves, mask, hair covering, sterile ultrasound probe cover (if used).  Procedure Description Area of catheter insertion was cleaned with chlorhexidine and draped in sterile fashion.  With real-time ultrasound guidance a central venous catheter was placed into the right internal jugular vein. Nonpulsatile blood flow and easy flushing noted in all ports.  The catheter was sutured in place and sterile dressing applied.  Complications/Tolerance None; patient tolerated the procedure well. Chest X-ray is ordered to verify placement for internal jugular or subclavian cannulation.   Chest x-ray is not ordered for femoral cannulation.  EBL Minimal  Specimen(s) None   Dr.Gonzales was present and readily available throughout procedures   Tonye Royalty ACNP-BC

## 2022-09-26 NOTE — Procedures (Signed)
Arterial Catheter Insertion Procedure Note  Daniel Austin  [id number]  12-30-1940  Date:10/16/2022  Time:11:30 AM    Provider Performing: Tonye Royalty    Procedure: Insertion of Arterial Line [phone]) with US guidance (84536)   Indication(s) Blood pressure monitoring and/or need for frequent ABGs  Consent Risks of the procedure as well as the alternatives and risks of each were explained to the patient and/or caregiver.  Consent for the procedure was obtained and is signed in the bedside chart  Anesthesia None   Time Out Verified patient identification, verified procedure, site/side was marked, verified correct patient position, special equipment/implants available, medications/allergies/relevant history reviewed, required imaging and test results available.   Sterile Technique Maximal sterile technique including full sterile barrier drape, hand hygiene, sterile gown, sterile gloves, mask, hair covering, sterile ultrasound probe cover (if used).   Procedure Description Area of catheter insertion was cleaned with chlorhexidine and draped in sterile fashion. With real-time ultrasound guidance an arterial catheter was placed into the right radial artery.  Appropriate arterial tracings confirmed on monitor.     Complications/Tolerance None; patient tolerated the procedure well.   EBL Minimal   Specimen(s) None   Dr.Gonzales was present and available throughout the procedure   Tonye Royalty ACNP-BC

## 2022-09-26 NOTE — ED Triage Notes (Signed)
EMS called for seizure. EMS found patient in bed hypotensive 76/36 and given zofran for NVD. PIV placed and fluids given en route. AMS with EMS. Code sepsis called en route to [facility].

## 2022-09-26 NOTE — ED Provider Notes (Signed)
[facility] Provider Note    Event Date/Time   First MD Initiated Contact with Patient 09/29/2022 [phone]     (approximate)   History   Sepsis   HPI  Daniel Austin is a 82 y.o. male brought to the ED via EMS from home with a chief complaint of sepsis alert.  EMS reports called out in reference to nausea/vomiting/diarrhea.  Patient had syncopal episode when first responders stood him.  EMS reports hypotension in the field, second bag of 500 cc IV normal saline infusing.  Gave 4 mg IV Zofran prior to arrival.  Patient denies fever, chest pain, shortness of breath, abdominal pain but he is confused.     Past Medical History   Past Medical History:  Diagnosis Date   Anemia    Anxiety    Arthritis    CAD (coronary artery disease)    Cardiomyopathy ([place])    Carotid artery stenosis    high-grade symptomatic right sided s/p stent twice   CHF (congestive heart failure) ([place])    chronic movement fluid and history of CHF he noticed in the    Depression    Fractures, multiple    Metal rod in left foot   GERD (gastroesophageal reflux disease)    Heart block    Hypercholesterolemia    Hyperglycemia    Hypertension    Hypothyroidism    Knee pain    Presence of permanent cardiac pacemaker    11/16/2015 Medtronic ADR01,   (2008 St Jude)   Shortness of breath dyspnea    Stroke ([place])    clot in neck broke off left side weakness 2008  resolved   Syncope    second degree heart block s/p St Jude's pacemaker placement 12/08 with his history is limited with any   Ulcer of gastric fundus      Active Problem List   Patient Active Problem List   Diagnosis Date Noted   Memory change 08/27/2022   History of right common carotid artery stent placement 06/12/2021   Pacemaker 06/12/2021   Weakness 04/30/2020   Hyponatremia 05/10/2019   Dog bite 01/21/2019   Stress 05/05/2017   Weakness of left arm 11/09/2016   H/O total knee replacement 03/24/2016   S/P total  knee arthroplasty 64/40/3474   Chronic systolic CHF (congestive heart failure) ([place]) 12/13/2015   HLD (hyperlipidemia) 12/13/2015   BP (high blood pressure) 12/13/2015   Cardiomyopathy, ischemic 12/13/2015   MI (mitral incompetence) 12/13/2015   Billowing mitral valve 12/13/2015   History of ventricular tachycardia 12/13/2015   Carotid stenosis 12/13/2015   Syncope and collapse 12/13/2015   Disease of thyroid gland 12/13/2015   Health care maintenance 09/04/2015   Encounter for completion of form with patient 01/12/2015   Left arm numbness 09/30/2014   Ischemia of upper extremity 09/30/2014   Pins and needles sensation 09/30/2014   Unspecified visual disturbance 09/30/2014   Paresthesia of arm 09/30/2014   Primary osteoarthritis of both knees 09/29/2014   History of cardiac catheterization 05/12/2014   Other specified postprocedural states 05/12/2014   Change in vision 01/09/2014   Knee pain, bilateral 01/09/2014   Arthralgia of lower leg 01/09/2014   Left knee pain 09/13/2013   Hyperglycemia 11/05/2012   Carotid artery stenosis 11/05/2012   Carotid artery narrowing 11/05/2012   Carotid artery obstruction 11/05/2012   Abnormal blood sugar 11/05/2012   Hypothyroidism 10/31/2012   Pure hypercholesterolemia 10/31/2012   Dysphagia, unspecified(787.20) 10/31/2012   Coronary atherosclerosis of  native coronary artery 10/31/2012   Anemia 10/31/2012   Essential hypertension, benign 10/31/2012   Absolute anemia 10/31/2012   Benign essential HTN 10/31/2012   Adult hypothyroidism 10/31/2012     Past Surgical History   Past Surgical History:  Procedure Laterality Date   CARDIAC CATHETERIZATION     CATARACT EXTRACTION W/PHACO Right 03/07/2021   Procedure: CATARACT EXTRACTION PHACO AND INTRAOCULAR LENS PLACEMENT (IOC) RIGHT 8.69 00:55.2;  Surgeon: Birder Robson, MD;  Location: [facility];  Service: Ophthalmology;  Laterality: Right;   COLONOSCOPY N/A 07/06/2021    Procedure: COLONOSCOPY;  Surgeon: Annamaria Helling, DO;  Location[facility];  Service: Gastroenterology;  Laterality: N/A;   CORONARY ANGIOPLASTY     stents x 3   heart stint  11/04/2014   [facility]   HERNIA REPAIR     inguinal   INSERT / REPLACE / REMOVE PACEMAKER     JOINT REPLACEMENT     right knee   KNEE ARTHROPLASTY Right 02/08/2016   Procedure: COMPUTER ASSISTED TOTAL KNEE ARTHROPLASTY;  Surgeon: Dereck Leep, MD;  Location: [facility];  Service: Orthopedics;  Laterality: Right;   KNEE ARTHROPLASTY Left 06/27/2016   Procedure: COMPUTER ASSISTED TOTAL KNEE ARTHROPLASTY;  Surgeon: Dereck Leep, MD;  Location: [facility];  Service: Orthopedics;  Laterality: Left;   PACEMAKER INSERTION  08/2007   PACEMAKER INSERTION N/A 11/16/2015   Procedure: INSERTION PACEMAKER/ PACEMAKER CHANGE OUT;  Surgeon: Isaias Cowman, MD;  Location: [facility];  Service: Cardiovascular;  Laterality: N/A;   PERCUTANEOUS PLACEMENT INTRAVASCULAR STENT CERVICAL CAROTID ARTERY     twice, right carotid artery     Home Medications   Prior to Admission medications   Medication Sig Start Date End Date Taking? Authorizing Provider  amLODipine (NORVASC) 5 MG tablet TAKE 1 TABLET (5 MG TOTAL) BY MOUTH DAILY. 08/28/21  Yes Einar Pheasant, MD  Aspirin 81 MG EC tablet Take 81 mg by mouth daily.   Yes [provider]  citalopram (CELEXA) 20 MG tablet TAKE 1 TABLET BY MOUTH EVERY DAY 05/31/22  Yes Einar Pheasant, MD  clopidogrel (PLAVIX) 75 MG tablet TAKE 1 TABLET BY MOUTH EVERY DAY 07/12/22  Yes Einar Pheasant, MD  levothyroxine (SYNTHROID) 125 MCG tablet TAKE 1 TABLET BY MOUTH EVERY DAY BEFORE BREAKFAST 02/26/22  Yes Einar Pheasant, MD  lisinopril (ZESTRIL) 40 MG tablet TAKE 1 TABLET BY MOUTH EVERY DAY 03/19/22  Yes Einar Pheasant, MD  metoprolol tartrate (LOPRESSOR) 25 MG tablet TAKE 1 TABLET BY MOUTH TWICE A DAY 08/27/22  Yes Einar Pheasant, MD  pantoprazole (PROTONIX) 40 MG tablet TAKE 1 TABLET  BY MOUTH EVERY DAY 05/04/22  Yes Einar Pheasant, MD  rosuvastatin (CRESTOR) 20 MG tablet TAKE 1 TABLET BY MOUTH EVERY DAY AT NIGHT 05/04/22  Yes Einar Pheasant, MD  acetaminophen (TYLENOL) 500 MG tablet Take 1,000 mg by mouth every 6 (six) hours as needed.    [provider]  fexofenadine (ALLEGRA) 180 MG tablet Take 180 mg by mouth daily. As needed    [provider]  LORazepam (ATIVAN) 1 MG tablet 1/2-1 TABLET AT BEDTIME AS NEEDED . 09/11/22   Einar Pheasant, MD  nitroGLYCERIN (NITROSTAT) 0.4 MG SL tablet Place 0.4 mg under the tongue every 5 (five) minutes as needed. If a third tablet is needed please call 911. Patient not taking: Reported on 09/25/2022    [provider]  triamcinolone (NASACORT) 55 MCG/ACT AERO nasal inhaler Place into the nose. Patient not taking: Reported on 09/28/2022 08/28/18  [provider]  triamcinolone cream (KENALOG) 0.1 % Apply 1 application topically 2 (two) times daily. Patient not taking: Reported on 10/12/2022 09/25/17   Einar Pheasant, MD     Allergies  Beta adrenergic blockers and Lipitor [atorvastatin]   Family History   Family History  Problem Relation Age of Onset   Heart disease Mother        CHF   Heart disease Father        CHF   Thyroid disease Father        hypothyroidism   Colon polyps Father    Diabetes Sister        diet controlled   Thyroid disease Sister    Cancer Sister        breast    Thyroid disease Sister    Cancer Sister        breast   Colon cancer Neg Hx    Prostate cancer Neg Hx      Physical Exam  Triage Vital Signs: ED Triage Vitals  Enc Vitals Group     BP      Pulse      Resp      Temp      Temp src      SpO2      Weight      Height      Head Circumference      Peak Flow      Pain Score      Pain Loc      Pain Edu?      Excl. in [place]?     Updated Vital Signs: BP 96/85   Pulse (!) 28   Temp (!) 96.8 F (36 C) (Rectal)   Resp (!) 38   Ht 6' (1.829 m)   Wt  79.4 kg   SpO2 94%   BMI 23.73 kg/m    General: Awake, mild to moderate distress.  CV:  RRR.  Good peripheral perfusion.  Resp:  Increased effort.  Diminished aeration, otherwise CTAB. Abd:  Nontender to light or deep palpation.  No distention.  Other:  Cool to the touch.  Pale.   ED Results / Procedures / Treatments  Labs (all labs ordered are listed, but only abnormal results are displayed) Labs Reviewed  LACTIC ACID, PLASMA - Abnormal; Notable for the following components:      Result Value   Lactic Acid, Venous 5.3 (*)    All other components within normal limits  CBC WITH DIFFERENTIAL/PLATELET - Abnormal; Notable for the following components:   WBC 17.3 (*)    Neutro Abs 14.9 (*)    Monocytes Absolute 1.1 (*)    Abs Immature Granulocytes 0.11 (*)    All other components within normal limits  COMPREHENSIVE METABOLIC PANEL - Abnormal; Notable for the following components:   Sodium 126 (*)    CO2 10 (*)    Glucose, Bld 214 (*)    BUN 26 (*)    Creatinine, Ser 1.62 (*)    AST 42 (*)    GFR, Estimated 42 (*)    Anion gap 16 (*)    All other components within normal limits  LIPASE, BLOOD - Abnormal; Notable for the following components:   Lipase 61 (*)    All other components within normal limits  BLOOD GAS, ARTERIAL - Abnormal; Notable for the following components:   pH, Arterial 7.3 (*)    pCO2 arterial 22 (*)    pO2, Arterial 67 (*)  Bicarbonate 10.8 (*)    Acid-base deficit 13.5 (*)    All other components within normal limits  TROPONIN I (HIGH SENSITIVITY) - Abnormal; Notable for the following components:   Troponin I (High Sensitivity) 71 (*)    All other components within normal limits  RESP PANEL BY RT-PCR (RSV, FLU A&B, COVID)  RVPGX2  CULTURE, BLOOD (ROUTINE X 2)  CULTURE, BLOOD (ROUTINE X 2)  URINE CULTURE  LACTIC ACID, PLASMA  URINALYSIS, ROUTINE W REFLEX MICROSCOPIC  PROCALCITONIN  TROPONIN I (HIGH SENSITIVITY)     EKG  ED ECG REPORT I,  Helem Reesor J, the attending physician, personally viewed and interpreted this ECG.   Date: 09/27/2022  EKG Time: 0045  Rate: 93  Rhythm: normal sinus rhythm  Axis: LAD  Intervals:left bundle branch block  ST&T Change: Nonspecific    RADIOLOGY I have independently visualized and interpreted patient's CT scans and chest x-ray as well as noted the radiology interpretation:  Chest x-ray: No acute cardiopulmonary process  CT head: No ICH  CTA chest: No PE or other acute intrathoracic process  CT abdomen pelvis: Gastritis/duodenitis, enteritis, unruptured AAA  Official radiology report(s): CT Abdomen Pelvis W Contrast  Result Date: 09/25/2022 CLINICAL DATA:  Syncope and hypotension. EXAM: CT ABDOMEN AND PELVIS WITH CONTRAST TECHNIQUE: Multidetector CT imaging of the abdomen and pelvis was performed using the standard protocol following bolus administration of intravenous contrast. RADIATION DOSE REDUCTION: This exam was performed according to the departmental dose-optimization program which includes automated exposure control, adjustment of the mA and/or kV according to patient size and/or use of iterative reconstruction technique. CONTRAST:  132m OMNIPAQUE IOHEXOL 350 MG/ML SOLN COMPARISON:  None Available. FINDINGS: Lower chest: No acute abnormality. Hepatobiliary: No focal liver abnormality is seen. No gallstones, gallbladder wall thickening, or biliary dilatation. Pancreas: Unremarkable. No pancreatic ductal dilatation or surrounding inflammatory changes. Spleen: Normal in size without focal abnormality. Adrenals/Urinary Tract: Adrenal glands are unremarkable. Kidneys are normal, without renal calculi, focal lesion, or hydronephrosis. There is mild diffuse urinary bladder wall thickening. Stomach/Bowel: There is diffuse thickening of the gastric antrum. The proximal duodenum is markedly thickened and inflamed. A 6 mm focus of air attenuation is seen within this region and is believed to be  intraluminal in location. Mild to moderate severity diffusely thickened loops of small bowel are seen throughout the abdomen and pelvis. A large amount of stool is seen within the mid to distal sigmoid colon and rectum. Fluid and air-filled large bowel loops are seen throughout the remainder of the abdomen. The appendix is not identified. Noninflamed diverticula are seen throughout the sigmoid colon. Vascular/Lymphatic: Marked severity aortic atherosclerosis and tortuosity of the abdominal aorta and bilateral iliac arteries, with 3.8 cm x 3.7 cm x 3.8 cm aneurysmal dilatation of the infrarenal abdominal aorta. There is 2.3 cm diameter aneurysmal dilatation of the right common iliac artery and 2.4 cm diameter aneurysmal dilatation of the mid to distal left internal iliac artery. No enlarged abdominal or pelvic lymph nodes. Reproductive: Prostate is unremarkable. Other: A 7.0 cm x 4.8 cm left inguinal hernia is seen. This extends into the scrotum and contains a long segment of proximal sigmoid colon. No abdominopelvic ascites. Musculoskeletal: Multilevel degenerative changes are seen throughout the lumbar spine. IMPRESSION: 1. Findings consistent with marked severity gastritis and/or duodenitis. 2. Mild to moderate severity diffuse enteritis. 3. Large amount of stool within the mid to distal sigmoid colon and rectum, consistent with constipation. 4. Sigmoid diverticulosis. 5. 3.8 cm x 3.7 cm x  3.8 cm infrarenal abdominal aortic aneurysm. Recommend follow-up ultrasound every 2 years. This recommendation follows ACR consensus guidelines: White Paper of the ACR Incidental Findings Committee II on Vascular Findings. J Am Coll Radiol 2013; 81:829-937. 6. 7.0 cm x 4.8 cm left inguinal hernia, as described above. 7. Mild diffuse urinary bladder wall thickening, which may represent cystitis. Correlation with urinalysis is recommended. Aortic Atherosclerosis (ICD10-I70.0). Electronically Signed   By: Virgina Norfolk M.D.    On: 09/29/2022 03:17   CT Angio Chest PE W/Cm &/Or Wo Cm  Result Date: 10/03/2022 CLINICAL DATA:  Status post seizure. EXAM: CT ANGIOGRAPHY CHEST WITH CONTRAST TECHNIQUE: Multidetector CT imaging of the chest was performed using the standard protocol during bolus administration of intravenous contrast. Multiplanar CT image reconstructions and MIPs were obtained to evaluate the vascular anatomy. RADIATION DOSE REDUCTION: This exam was performed according to the departmental dose-optimization program which includes automated exposure control, adjustment of the mA and/or kV according to patient size and/or use of iterative reconstruction technique. CONTRAST:  169m OMNIPAQUE IOHEXOL 350 MG/ML SOLN COMPARISON:  None Available. FINDINGS: Cardiovascular: There is marked severity calcification of the thoracic aorta, without evidence of aortic aneurysm. Satisfactory opacification of the pulmonary arteries to the segmental level. No evidence of pulmonary embolism. Normal heart size with marked severity coronary artery calcification. There is a small anterior pericardial effusion. Mediastinum/Nodes: No enlarged mediastinal, hilar, or axillary lymph nodes. Thyroid gland, trachea, and esophagus demonstrate no significant findings. Lungs/Pleura: Lungs are clear. No pleural effusion or pneumothorax. Upper Abdomen: No acute abnormality. Musculoskeletal: Marked severity degenerative changes seen involving the left shoulder. Multilevel degenerative changes seen throughout the thoracic spine. Review of the MIP images confirms the above findings. IMPRESSION: 1. No evidence of pulmonary embolism or other acute intrathoracic process. 2. Marked severity coronary artery calcification. 3. Small anterior pericardial effusion. 4. Aortic atherosclerosis. Aortic Atherosclerosis (ICD10-I70.0). Electronically Signed   By: TVirgina NorfolkM.D.   On: 09/30/2022 03:01   CT Head Wo Contrast  Result Date: 10/07/2022 CLINICAL DATA:  Status  post seizure. EXAM: CT HEAD WITHOUT CONTRAST TECHNIQUE: Contiguous axial images were obtained from the base of the skull through the vertex without intravenous contrast. RADIATION DOSE REDUCTION: This exam was performed according to the departmental dose-optimization program which includes automated exposure control, adjustment of the mA and/or kV according to patient size and/or use of iterative reconstruction technique. COMPARISON:  June 12, 2021 FINDINGS: Brain: There is mild cerebral atrophy with widening of the extra-axial spaces and ventricular dilatation. There are areas of decreased attenuation within the white matter tracts of the supratentorial brain, consistent with microvascular disease changes. Chronic right frontal parietal and right parietooccipital infarcts are seen. Vascular: No hyperdense vessel or unexpected calcification. Skull: Normal. Negative for fracture or focal lesion. Sinuses/Orbits: No acute finding. Other: None. IMPRESSION: 1. No acute intracranial abnormality. 2. Chronic right frontal parietal and right parietooccipital infarcts. 3. Generalized cerebral atrophy and microvascular disease changes of the supratentorial brain. Electronically Signed   By: TVirgina NorfolkM.D.   On: 10/17/2022 02:58   DG Chest Port 1 View  Result Date: 09/24/2022 CLINICAL DATA:  Vomiting with hypotension and altered mental status. EXAM: PORTABLE CHEST 1 VIEW COMPARISON:  June 12, 2021 FINDINGS: There is stable dual lead AICD positioning. The heart size and mediastinal contours are within normal limits. There is marked severity calcification of the thoracic aorta. Low lung volumes are noted. Both lungs are clear. Marked severity degenerative changes are seen involving the left shoulder. IMPRESSION:  Low lung volumes without active cardiopulmonary disease. Electronically Signed   By: Virgina Norfolk M.D.   On: 10/03/2022 01:22     PROCEDURES:  Critical Care performed: Yes, see critical care  procedure note(s)  CRITICAL CARE Performed by: Paulette Blanch   Total critical care time: 60 minutes  Critical care time was exclusive of separately billable procedures and treating other patients.  Critical care was necessary to treat or prevent imminent or life-threatening deterioration.  Critical care was time spent personally by me on the following activities: development of treatment plan with patient and/or surrogate as well as nursing, discussions with consultants, evaluation of patient's response to treatment, examination of patient, obtaining history from patient or surrogate, ordering and performing treatments and interventions, ordering and review of laboratory studies, ordering and review of radiographic studies, pulse oximetry and re-evaluation of patient's condition.   Marland Kitchen1-3 Lead EKG Interpretation  Performed by: Paulette Blanch, MD Authorized by: Paulette Blanch, MD   Comments:     Patient placed on cardiac monitor to evaluate for arrhythmias    MEDICATIONS ORDERED IN ED: Medications  norepinephrine (LEVOPHED) '4mg'$  in 275m (0.016 mg/mL) premix infusion (2.5 mcg/min Intravenous Rate/Dose Change 10/15/2022 0219)  vancomycin (VANCOREADY) IVPB 1750 mg/350 mL (1,750 mg Intravenous New Bag/Given 10/06/2022 0306)  pantoprazole (PROTONIX) injection 40 mg (has no administration in time range)  sodium chloride 0.9 % bolus 500 mL (0 mLs Intravenous Stopped 10/09/2022 0153)  ceFEPIme (MAXIPIME) 2 g in sodium chloride 0.9 % 100 mL IVPB (0 g Intravenous Stopped 10/23/2022 0219)  metroNIDAZOLE (FLAGYL) IVPB 500 mg (0 mg Intravenous Stopped 10/15/2022 0241)  iohexol (OMNIPAQUE) 350 MG/ML injection 100 mL (100 mLs Intravenous Contrast Given 10/05/2022 0254)     IMPRESSION / MDM / ASSESSMENT AND PLAN / ED COURSE  I reviewed the triage vital signs and the nursing notes.                             82year old male presenting as sepsis alert with hypotension, nausea/vomiting/diarrhea.  Differential diagnosis  includes, but is not limited to, acute appendicitis, renal colic, testicular torsion, urinary tract infection/pyelonephritis, prostatitis,  epididymitis, diverticulitis, small bowel obstruction or ileus, colitis, abdominal aortic aneurysm, gastroenteritis, hernia, etc. I have personally reviewed patient's records and note a PCP office visit on 06/27/2022 for hypertension.  Patient's presentation is most consistent with acute presentation with potential threat to life or bodily function.  The patient is on the cardiac monitor to evaluate for evidence of arrhythmia and/or significant heart rate changes.  Will obtain ED code sepsis protocol, image CT head, abdomen/pelvis and chest x-ray.  Anticipate hospitalization.  Clinical Course as of 09/24/2022 0327  Wed Sep 26, 2022  0124 Patient almost finished with second liter IV fluids.  He has another 500 cc to go.  Do not suspect he will be adequately fluid resuscitated and thus will start Levophed. [JS]  0223 Lactic acid 5.3.  Patient currently on Levophed.  WBC 17.  Chest x-ray unremarkable.  Awaiting CT scans. [JS]  01937CT head and chest unremarkable.  CT abdomen pelvis remarkable for gastritis and enteritis.  Patient currently on Levophed.  Family at bedside.  All updated.  Discussed with CCU intensivist who will evaluate patient for admission. [JS]    Clinical Course User Index [JS] SPaulette Blanch MD     FINAL CLINICAL IMPRESSION(S) / ED DIAGNOSES   Final diagnoses:  Nausea vomiting and diarrhea  Hypothermia,  initial encounter  Sepsis, due to unspecified organism, unspecified whether acute organ dysfunction present ([place])  Hypotension, unspecified hypotension type  Hyponatremia  AKI (acute kidney injury) ([place])  Hypovolemic shock ([place])  Gastritis and duodenitis  Enteritis     Rx / DC Orders   ED Discharge Orders     None        Note:  This document was prepared using Dragon voice recognition software and may include unintentional  dictation errors.   Paulette Blanch, MD 10/05/2022 [phone]

## 2022-09-26 NOTE — Progress Notes (Signed)
An USGPIV (ultrasound guided PIV) has been placed for short-term vasopressor infusion. A correctly placed ivWatch must be used when administering Vasopressors. Should this treatment be needed beyond 72 hours, central line access should be obtained.  It will be the responsibility of the bedside nurse to follow best practice to prevent extravasations.   ?

## 2022-09-26 NOTE — Op Note (Signed)
Exploratory laparotomy, sigmoid resection with end colostomy.  Pre-operative Diagnosis: Ischemic enteritis/colitis  Post-operative Diagnosis: Ischemic sigmoid colitis, likely complicated secondary to sliding left inguinal hernia.   Surgeon: Ronny Bacon, M.D., FACS  (Assistant: Olean Ree, RNFA)  Anesthesia: General endotracheal  Findings: Upon entering the abdomen (organ space), I encountered, discolored peritoneal fluid, gray, demarcated sigmoid colon, with extensive adhesions to small bowel mesentery, and left inguinal hernia sac.  No evidence of perforation, no spillage of stool.  No exudative response, no peritonitis.   Estimated Blood Loss: 150 mL         Specimens: Sigmoid colon          Complications: none              Procedure Details  The patient was seen in his ICU room 16. The benefits, complications, treatment options, and expected outcomes were discussed with the patient, prior to his intubation, and again reviewed with his family at bedside. The risks of bleeding, infection, recurrence of symptoms, failure to resolve symptoms, unanticipated injury, any of which could require further surgery were reviewed with the patient. The likelihood of improving the patient's symptoms with return to their baseline status is guarded.  The patient and/or family concurred with the proposed plan, giving informed consent.  The patient was taken to Operating Room, identified and the procedure verified.    Prior to the induction of general anesthesia, antibiotic coverage was administered.  General anesthesia was then administered and tolerated well. After the induction, the patient was positioned in the supine position and the abdomen was prepped with Chloraprep and draped in the sterile fashion.  A Time Out was held and the above information confirmed.  Midline incision was made and extended through the subcutaneous tissues and linea alba with cautious injury into the peritoneal  cavity. Immediate egress of discolored fluid was obtained, there is no remarkable malodor.  A large loop of sigmoid colon was immediately appreciated filling the majority of the lower abdomen.  It was scarred to the small bowel mesentery, and portion of it was reduced from the left inguinal hernia defect, with the remaining proximal sigmoid colon composing the sliding component of the left groin hernia. With it completely reduced and mobilized, was able to identify a distinctive transition point where the coloration resumed normal at the distal descending colon. The distal sigmoid and its junction with the rectum were markedly distended and edematous.  However the rectosigmoid junction was clearly viable. I proceeded with division of the sigmoid from the descending colon utilizing a GIA stapler.  LigaSure was utilized to divide the sigmoid mesentery to the rectosigmoid junction.  At that point rectosigmoid junction was divided with 75 mm GIA stapler.  And the specimen was delivered. We then irrigated the abdominal cavity with multiple aliquots of warm normal saline, confirmed adequate hemostasis.  As most of the hernia sac was composed of the sigmoid colon, there was no peritoneal defect to close in the left groin.  I felt it prudent not to prolong the procedure and add any other foreign body to this patient's abdomen. I then mobilized the descending colon to allow for placement of the left upper quadrant colostomy site.  A circular defect was created in the abdominal wall excising skin, creating a cruciate incision in the anterior and posterior rectus sheath and dilating the opening to 2-3 fingers.  I then passed distal colon through this site, placed 4 quadrants of fascial sutures to the colon with 3-0 Vicryl.  I  elected not to mature the stoma, hoping to avoid any further contamination to the midline incision. Hemostasis was obtained on the fatty tissues of the colostomy with electrocautery. The midline  incision was closed with running 0 PDS.  The soft tissues were irrigated again, and the midline skin was reapproximated with staples.  Colostomy appliance applied over the colon, intentionally not matured. Honeycomb dressing applied over the midline staple line.  Patient was transferred back to the ICU still in guarded critical condition utilizing 3 vasopressors.     Ronny Bacon M.D., [facility] 10/02/2022 3:56 PM

## 2022-09-26 NOTE — Anesthesia Postprocedure Evaluation (Signed)
Anesthesia Post Note  Patient: Daniel Austin  Procedure(s) Performed: EXPLORATORY LAPAROTOMY; SIGMOID COLECTOMY WITH END COLOSTOMY CREATION (Abdomen)  Patient location during evaluation: SICU Anesthesia Type: General Level of consciousness: sedated Pain management: pain level controlled Vital Signs Assessment: post-procedure vital signs reviewed and stable Respiratory status: patient remains intubated per anesthesia plan Cardiovascular status: stable Postop Assessment: no apparent nausea or vomiting Anesthetic complications: no   No notable events documented.   Last Vitals:  Vitals:   09/30/2022 1830 10/03/2022 1845  BP: 106/66 101/73  Pulse:    Resp: (!) 0 20  Temp: 37.3 C 37.3 C  SpO2:      Last Pain:  Vitals:   10/14/2022 1030  TempSrc: Esophageal  PainSc:                  Precious Haws Evolett [place]

## 2022-09-26 NOTE — ED Notes (Signed)
Report called to CCU for admission to 16.

## 2022-09-26 NOTE — Progress Notes (Signed)
Pharmacy Antibiotic Note  Daniel Austin is a 82 y.o. male admitted on 10/24/2022 with sepsis. PMH significant for anemia, anxiety, depression, CAD, ICM s/p PCI, HFrEF (2018 EF 45%), CVA, HTN, HLD, GERD, hypothyroidism. Pharmacy has been consulted for cefepime dosing.  Plan: Day 1 of antibiotics Continue cefepime 2 g IV Q12H Patient is also on metronidazole 500 mg IV Q12H Continue to monitor renal function and follow culture results   Height: 6' (182.9 cm) Weight: 79.4 kg (175 lb) IBW/kg (Calculated) : 77.6  Temp (24hrs), Avg:97.4 F (36.3 C), Min:96.8 F (36 C), Max:97.9 F (36.6 C)  Recent Labs  Lab 10/20/2022 0101 10/12/2022 0313  WBC 17.3*  --   CREATININE 1.62*  --   LATICACIDVEN 5.3* 5.9*     Estimated Creatinine Clearance: 38.6 mL/min (A) (by C-G formula based on SCr of 1.62 mg/dL (H)).    Allergies  Allergen Reactions   Beta Adrenergic Blockers Other (See Comments)    Second Degree Heart Block   Lipitor [Atorvastatin] Other (See Comments)    Myalgia     Antimicrobials this admission: 1/3 Vancomycin x1 1/3 Cefepime >>  1/3 Metronidazole >>   Dose adjustments this admission: N/A  Microbiology results: 1/3 BCx: NG<12H 1/3 UCx: ordered  Thank you for allowing pharmacy to be a part of this patient's care.  Gretel Acre, PharmD PGY1 Pharmacy Resident 10/12/2022 9:39 AM

## 2022-09-26 NOTE — ED Notes (Signed)
Family at the bedside.

## 2022-09-26 NOTE — Progress Notes (Signed)
Pharmacy Antibiotic Note  Daniel Austin is a 82 y.o. male admitted on 10/24/2022 with sepsis.  Pharmacy has been consulted for Cefepime dosing.  Plan: Cefepime 2 gm IV X 1 given on 1/3 @ 0145. Cefepime 2 gm IV Q12H ordered to start on 1/3 @ 1400.   Height: 6' (182.9 cm) Weight: 79.4 kg (175 lb) IBW/kg (Calculated) : 77.6  Temp (24hrs), Avg:96.8 F (36 C), Min:96.8 F (36 C), Max:96.8 F (36 C)  Recent Labs  Lab 09/28/2022 0101 10/09/2022 0313  WBC 17.3*  --   CREATININE 1.62*  --   LATICACIDVEN 5.3* 5.9*    Estimated Creatinine Clearance: 38.6 mL/min (A) (by C-G formula based on SCr of 1.62 mg/dL (H)).    Allergies  Allergen Reactions   Beta Adrenergic Blockers Other (See Comments)    Second Degree Heart Block   Lipitor [Atorvastatin] Other (See Comments)    Myalgia     Antimicrobials this admission:   >>    >>   Dose adjustments this admission:   Microbiology results:  BCx:   UCx:    Sputum:    MRSA PCR:   Thank you for allowing pharmacy to be a part of this patient's care.  Gino Garrabrant D 10/19/2022 5:22 AM

## 2022-09-26 NOTE — Consult Note (Signed)
[facility] SURGICAL CONSULTATION NOTE (initial) - cpt: [id number]   HISTORY OF PRESENT ILLNESS (HPI):  82 y.o. male acmitted via [facility] ED today for evaluation of complaint of severe abdominal pain, with associated metabolic acidosis.. Patient reports acute onset with black liquid rectal discharge.  Marked restlessness and complaints of abdominal pain and acute distress limit history and presentation.  Surgery is consulted by ICU physician Dr. Patsey Berthold in this context for evaluation and management of the above.  PAST MEDICAL HISTORY (PMH):  Past Medical History:  Diagnosis Date   Anemia    Anxiety    Arthritis    CAD (coronary artery disease)    Cardiomyopathy ([place])    Carotid artery stenosis    high-grade symptomatic right sided s/p stent twice   CHF (congestive heart failure) (HCC)    chronic movement fluid and history of CHF he noticed in the    Depression    Fractures, multiple    Metal rod in left foot   GERD (gastroesophageal reflux disease)    Heart block    Hypercholesterolemia    Hyperglycemia    Hypertension    Hypothyroidism    Knee pain    Presence of permanent cardiac pacemaker    11/16/2015 Medtronic ADR01,   (2008 St Jude)   Shortness of breath dyspnea    Stroke ([place])    clot in neck broke off left side weakness 2008  resolved   Syncope    second degree heart block s/p St Jude's pacemaker placement 12/08 with his history is limited with any   Ulcer of gastric fundus      PAST SURGICAL HISTORY ([place]):  Past Surgical History:  Procedure Laterality Date   CARDIAC CATHETERIZATION     CATARACT EXTRACTION W/PHACO Right 03/07/2021   Procedure: CATARACT EXTRACTION PHACO AND INTRAOCULAR LENS PLACEMENT ([place]) RIGHT 8.69 00:55.2;  Surgeon: Birder Robson, MD;  Location: [place];  Service: Ophthalmology;  Laterality: Right;   COLONOSCOPY N/A 07/06/2021   Procedure: COLONOSCOPY;  Surgeon: Annamaria Helling, DO;  Location: [facility] ENDOSCOPY;   Service: Gastroenterology;  Laterality: N/A;   CORONARY ANGIOPLASTY     stents x 3   heart stint  11/04/2014   [facility]   HERNIA REPAIR     inguinal   INSERT / REPLACE / REMOVE PACEMAKER     JOINT REPLACEMENT     right knee   KNEE ARTHROPLASTY Right 02/08/2016   Procedure: COMPUTER ASSISTED TOTAL KNEE ARTHROPLASTY;  Surgeon: Dereck Leep, MD;  Location: [facility] ORS;  Service: Orthopedics;  Laterality: Right;   KNEE ARTHROPLASTY Left 06/27/2016   Procedure: COMPUTER ASSISTED TOTAL KNEE ARTHROPLASTY;  Surgeon: Dereck Leep, MD;  Location: [facility] ORS;  Service: Orthopedics;  Laterality: Left;   PACEMAKER INSERTION  08/2007   PACEMAKER INSERTION N/A 11/16/2015   Procedure: INSERTION PACEMAKER/ PACEMAKER CHANGE OUT;  Surgeon: Isaias Cowman, MD;  Location: [facility] ORS;  Service: Cardiovascular;  Laterality: N/A;   PERCUTANEOUS PLACEMENT INTRAVASCULAR STENT CERVICAL CAROTID ARTERY     twice, right carotid artery     MEDICATIONS:  Prior to Admission medications   Medication Sig Start Date End Date Taking? Authorizing Provider  amLODipine (NORVASC) 5 MG tablet TAKE 1 TABLET (5 MG TOTAL) BY MOUTH DAILY. 08/28/21  Yes Einar Pheasant, MD  Aspirin 81 MG EC tablet Take 81 mg by mouth daily.   Yes [provider]  citalopram (CELEXA) 20 MG tablet TAKE 1 TABLET BY MOUTH EVERY DAY 05/31/22  Yes Scott,  Charlene, MD  clopidogrel (PLAVIX) 75 MG tablet TAKE 1 TABLET BY MOUTH EVERY DAY 07/12/22  Yes Einar Pheasant, MD  levothyroxine (SYNTHROID) 125 MCG tablet TAKE 1 TABLET BY MOUTH EVERY DAY BEFORE BREAKFAST 02/26/22  Yes Einar Pheasant, MD  lisinopril (ZESTRIL) 40 MG tablet TAKE 1 TABLET BY MOUTH EVERY DAY 03/19/22  Yes Einar Pheasant, MD  metoprolol tartrate (LOPRESSOR) 25 MG tablet TAKE 1 TABLET BY MOUTH TWICE A DAY 08/27/22  Yes Einar Pheasant, MD  pantoprazole (PROTONIX) 40 MG tablet TAKE 1 TABLET BY MOUTH EVERY DAY 05/04/22  Yes Einar Pheasant, MD  rosuvastatin (CRESTOR) 20 MG tablet TAKE 1  TABLET BY MOUTH EVERY DAY AT NIGHT 05/04/22  Yes Einar Pheasant, MD  acetaminophen (TYLENOL) 500 MG tablet Take 1,000 mg by mouth every 6 (six) hours as needed.    [provider]  fexofenadine (ALLEGRA) 180 MG tablet Take 180 mg by mouth daily. As needed    [provider]  LORazepam (ATIVAN) 1 MG tablet 1/2-1 TABLET AT BEDTIME AS NEEDED . 09/11/22   Einar Pheasant, MD  nitroGLYCERIN (NITROSTAT) 0.4 MG SL tablet Place 0.4 mg under the tongue every 5 (five) minutes as needed. If a third tablet is needed please call 911. Patient not taking: Reported on 10/12/2022    [provider]  triamcinolone (NASACORT) 55 MCG/ACT AERO nasal inhaler Place into the nose. Patient not taking: Reported on 10/24/2022 08/28/18   [provider]  triamcinolone cream (KENALOG) 0.1 % Apply 1 application topically 2 (two) times daily. Patient not taking: Reported on 10/18/2022 09/25/17   Einar Pheasant, MD     ALLERGIES:  Allergies  Allergen Reactions   Beta Adrenergic Blockers Other (See Comments)    Second Degree Heart Block   Lipitor [Atorvastatin] Other (See Comments)    Myalgia      SOCIAL HISTORY:  Social History   Socioeconomic History   Marital status: Married    Spouse name: Not on file   Number of children: 1   Years of education: Not on file   Highest education level: Not on file  Occupational History   Not on file  Tobacco Use   Smoking status: Never   Smokeless tobacco: Never  Vaping Use   Vaping Use: Never used  Substance and Sexual Activity   Alcohol use: No    Alcohol/week: 0.0 standard drinks of alcohol   Drug use: No   Sexual activity: Not Currently  Other Topics Concern   Not on file  Social History Narrative   Not on file   Social Determinants of Health   Financial Resource Strain: Low Risk  (09/18/2022)   Overall Financial Resource Strain (CARDIA)    Difficulty of Paying Living Expenses: Not hard at all  Food Insecurity: No Food  Insecurity (09/18/2022)   Hunger Vital Sign    Worried About Running Out of Food in the Last Year: Never true    Ran Out of Food in the Last Year: Never true  Transportation Needs: No Transportation Needs (09/18/2022)   PRAPARE - Hydrologist (Medical): No    Lack of Transportation (Non-Medical): No  Physical Activity: Insufficiently Active (09/18/2022)   Exercise Vital Sign    Days of Exercise per Week: 5 days    Minutes of Exercise per Session: 10 min  Stress: No Stress Concern Present (09/18/2022)   [place]    Feeling of Stress : Not at all  Social Connections: Unknown (09/18/2022)   Social Connection and Isolation Panel [NHANES]    Frequency of Communication with Friends and Family: More than three times a week    Frequency of Social Gatherings with Friends and Family: More than three times a week    Attends Religious Services: Not on file    Active Member of Clubs or Organizations: Not on file    Attends Archivist Meetings: Not on file    Marital Status: Married  Intimate Partner Violence: Not At Risk (09/18/2022)   Humiliation, Afraid, Rape, and Kick questionnaire    Fear of Current or Ex-Partner: No    Emotionally Abused: No    Physically Abused: No    Sexually Abused: No     FAMILY HISTORY:  Family History  Problem Relation Age of Onset   Heart disease Mother        CHF   Heart disease Father        CHF   Thyroid disease Father        hypothyroidism   Colon polyps Father    Diabetes Sister        diet controlled   Thyroid disease Sister    Cancer Sister        breast    Thyroid disease Sister    Cancer Sister        breast   Colon cancer Neg Hx    Prostate cancer Neg Hx       REVIEW OF SYSTEMS:  Review of Systems  Unable to perform ROS: Acuity of condition    VITAL SIGNS:  Temp:  [96.8 F (36 C)-97.9 F (36.6 C)] 97.9 F (36.6 C) (01/03  0600) Pulse Rate:  [28-118] 96 (01/03 0945) Resp:  [19-40] 20 (01/03 0945) BP: (53-189)/(24-173) 125/66 (01/03 0945) SpO2:  [92 %-100 %] 100 % (01/03 0945) FiO2 (%):  [60 %] 60 % (01/03 0900) Weight:  [79.4 kg] 79.4 kg (01/03 0117)     Height: 6' (182.9 cm) Weight: 79.4 kg BMI (Calculated): 23.73   INTAKE/OUTPUT:  01/02 0701 - 01/03 0700 In: 800 [I.V.:800] Out: -   PHYSICAL EXAM:  Physical Exam Blood pressure 125/66, pulse 96, temperature 97.9 F (36.6 C), temperature source Axillary, resp. rate 20, height 6' (1.829 m), weight 79.4 kg, SpO2 100 %. Last Weight  Most recent update: 09/28/2022  1:17 AM    Weight  79.4 kg (175 lb)             CONSTITUTIONAL: Well developed, and nourished, appropriately responsive and with marked distress.  Restless agitated EYES: Sclera non-icteric.   EARS, NOSE, MOUTH AND THROAT:  The oropharynx is clear. Oral mucosa is pink and moist.   Hearing is intact to voice.  NECK: Trachea is midline, and there is no jugular venous distension.  LYMPH NODES:  Lymph nodes in the neck are not enlarged. RESPIRATORY:  Lungs are clear, and breath sounds are equal bilaterally. Normal respiratory effort without pathologic use of accessory muscles. CARDIOVASCULAR: Tachycardic GI: The abdomen is soft, subjectively tender, and nondistended.  No definitive rigidity.  There were no palpable masses. I did not appreciate bowel sounds.  GU: Groins are soft without appreciable hernia incarceration. MUSCULOSKELETAL:  Symmetrical muscle tone appreciated in all four extremities.    SKIN: Skin turgor is normal. No pathologic skin lesions appreciated.  NEUROLOGIC:  Motor and sensation appear grossly normal.  Cranial nerves are grossly without defect. PSYCH:  Alert and oriented to person, place and time. Affect  is appropriate for situation.  Data Reviewed I have personally reviewed what is currently available of the patient's imaging, recent labs and medical records.    Labs:      Latest Ref Rng & Units 09/28/2022    1:01 AM 08/27/2022    9:41 AM 06/12/2021    7:08 PM  CBC  WBC 4.0 - 10.5 K/uL 17.3  5.5  6.3   Hemoglobin 13.0 - 17.0 g/dL 13.8  11.6  12.4   Hematocrit 39.0 - 52.0 % 41.4  33.8  36.5   Platelets 150 - 400 K/uL 301  232.0  223       Latest Ref Rng & Units 10/08/2022    8:46 AM 10/08/2022    1:01 AM 09/06/2022    8:51 AM  CMP  Glucose 70 - 99 mg/dL 159  214  113   BUN 8 - 23 mg/dL 37  26  15   Creatinine 0.61 - 1.24 mg/dL 1.92  1.62  0.87   Sodium 135 - 145 mmol/L 131  126  131   Potassium 3.5 - 5.1 mmol/L 4.0  3.6  4.5   Chloride 98 - 111 mmol/L 100  100  96   CO2 22 - 32 mmol/L '14  10  29   '$ Calcium 8.9 - 10.3 mg/dL 8.7  8.9  9.4   Total Protein 6.5 - 8.1 g/dL  6.6    Total Bilirubin 0.3 - 1.2 mg/dL  1.2    Alkaline Phos 38 - 126 U/L  70    AST 15 - 41 U/L  42    ALT 0 - 44 U/L  13       Imaging studies:   Last 24 hrs: CT Abdomen Pelvis W Contrast  Result Date: 10/11/2022 CLINICAL DATA:  Syncope and hypotension. EXAM: CT ABDOMEN AND PELVIS WITH CONTRAST TECHNIQUE: Multidetector CT imaging of the abdomen and pelvis was performed using the standard protocol following bolus administration of intravenous contrast. RADIATION DOSE REDUCTION: This exam was performed according to the departmental dose-optimization program which includes automated exposure control, adjustment of the mA and/or kV according to patient size and/or use of iterative reconstruction technique. CONTRAST:  174m OMNIPAQUE IOHEXOL 350 MG/ML SOLN COMPARISON:  None Available. FINDINGS: Lower chest: No acute abnormality. Hepatobiliary: No focal liver abnormality is seen. No gallstones, gallbladder wall thickening, or biliary dilatation. Pancreas: Unremarkable. No pancreatic ductal dilatation or surrounding inflammatory changes. Spleen: Normal in size without focal abnormality. Adrenals/Urinary Tract: Adrenal glands are unremarkable. Kidneys are normal, without renal calculi, focal  lesion, or hydronephrosis. There is mild diffuse urinary bladder wall thickening. Stomach/Bowel: There is diffuse thickening of the gastric antrum. The proximal duodenum is markedly thickened and inflamed. A 6 mm focus of air attenuation is seen within this region and is believed to be intraluminal in location. Mild to moderate severity diffusely thickened loops of small bowel are seen throughout the abdomen and pelvis. A large amount of stool is seen within the mid to distal sigmoid colon and rectum. Fluid and air-filled large bowel loops are seen throughout the remainder of the abdomen. The appendix is not identified. Noninflamed diverticula are seen throughout the sigmoid colon. Vascular/Lymphatic: Marked severity aortic atherosclerosis and tortuosity of the abdominal aorta and bilateral iliac arteries, with 3.8 cm x 3.7 cm x 3.8 cm aneurysmal dilatation of the infrarenal abdominal aorta. There is 2.3 cm diameter aneurysmal dilatation of the right common iliac artery and 2.4 cm diameter aneurysmal dilatation of the mid to distal  left internal iliac artery. No enlarged abdominal or pelvic lymph nodes. Reproductive: Prostate is unremarkable. Other: A 7.0 cm x 4.8 cm left inguinal hernia is seen. This extends into the scrotum and contains a long segment of proximal sigmoid colon. No abdominopelvic ascites. Musculoskeletal: Multilevel degenerative changes are seen throughout the lumbar spine. IMPRESSION: 1. Findings consistent with marked severity gastritis and/or duodenitis. 2. Mild to moderate severity diffuse enteritis. 3. Large amount of stool within the mid to distal sigmoid colon and rectum, consistent with constipation. 4. Sigmoid diverticulosis. 5. 3.8 cm x 3.7 cm x 3.8 cm infrarenal abdominal aortic aneurysm. Recommend follow-up ultrasound every 2 years. This recommendation follows ACR consensus guidelines: White Paper of the ACR Incidental Findings Committee II on Vascular Findings. J Am Coll Radiol 2013;  60:109-323. 6. 7.0 cm x 4.8 cm left inguinal hernia, as described above. 7. Mild diffuse urinary bladder wall thickening, which may represent cystitis. Correlation with urinalysis is recommended. Aortic Atherosclerosis (ICD10-I70.0). Electronically Signed   By: Virgina Norfolk M.D.   On: 10/22/2022 03:17   CT Angio Chest PE W/Cm &/Or Wo Cm  Result Date: 10/20/2022 CLINICAL DATA:  Status post seizure. EXAM: CT ANGIOGRAPHY CHEST WITH CONTRAST TECHNIQUE: Multidetector CT imaging of the chest was performed using the standard protocol during bolus administration of intravenous contrast. Multiplanar CT image reconstructions and MIPs were obtained to evaluate the vascular anatomy. RADIATION DOSE REDUCTION: This exam was performed according to the departmental dose-optimization program which includes automated exposure control, adjustment of the mA and/or kV according to patient size and/or use of iterative reconstruction technique. CONTRAST:  13m OMNIPAQUE IOHEXOL 350 MG/ML SOLN COMPARISON:  None Available. FINDINGS: Cardiovascular: There is marked severity calcification of the thoracic aorta, without evidence of aortic aneurysm. Satisfactory opacification of the pulmonary arteries to the segmental level. No evidence of pulmonary embolism. Normal heart size with marked severity coronary artery calcification. There is a small anterior pericardial effusion. Mediastinum/Nodes: No enlarged mediastinal, hilar, or axillary lymph nodes. Thyroid gland, trachea, and esophagus demonstrate no significant findings. Lungs/Pleura: Lungs are clear. No pleural effusion or pneumothorax. Upper Abdomen: No acute abnormality. Musculoskeletal: Marked severity degenerative changes seen involving the left shoulder. Multilevel degenerative changes seen throughout the thoracic spine. Review of the MIP images confirms the above findings. IMPRESSION: 1. No evidence of pulmonary embolism or other acute intrathoracic process. 2. Marked severity  coronary artery calcification. 3. Small anterior pericardial effusion. 4. Aortic atherosclerosis. Aortic Atherosclerosis (ICD10-I70.0). Electronically Signed   By: TVirgina NorfolkM.D.   On: 10/20/2022 03:01   CT Head Wo Contrast  Result Date: 10/17/2022 CLINICAL DATA:  Status post seizure. EXAM: CT HEAD WITHOUT CONTRAST TECHNIQUE: Contiguous axial images were obtained from the base of the skull through the vertex without intravenous contrast. RADIATION DOSE REDUCTION: This exam was performed according to the departmental dose-optimization program which includes automated exposure control, adjustment of the mA and/or kV according to patient size and/or use of iterative reconstruction technique. COMPARISON:  June 12, 2021 FINDINGS: Brain: There is mild cerebral atrophy with widening of the extra-axial spaces and ventricular dilatation. There are areas of decreased attenuation within the white matter tracts of the supratentorial brain, consistent with microvascular disease changes. Chronic right frontal parietal and right parietooccipital infarcts are seen. Vascular: No hyperdense vessel or unexpected calcification. Skull: Normal. Negative for fracture or focal lesion. Sinuses/Orbits: No acute finding. Other: None. IMPRESSION: 1. No acute intracranial abnormality. 2. Chronic right frontal parietal and right parietooccipital infarcts. 3. Generalized cerebral atrophy and microvascular  disease changes of the supratentorial brain. Electronically Signed   By: Virgina Norfolk M.D.   On: 10/13/2022 02:58   DG Chest Port 1 View  Result Date: 10/12/2022 CLINICAL DATA:  Vomiting with hypotension and altered mental status. EXAM: PORTABLE CHEST 1 VIEW COMPARISON:  June 12, 2021 FINDINGS: There is stable dual lead AICD positioning. The heart size and mediastinal contours are within normal limits. There is marked severity calcification of the thoracic aorta. Low lung volumes are noted. Both lungs are clear.  Marked severity degenerative changes are seen involving the left shoulder. IMPRESSION: Low lung volumes without active cardiopulmonary disease. Electronically Signed   By: Virgina Norfolk M.D.   On: 09/30/2022 01:22     Assessment/Plan:  82 y.o. male with severe metabolic acidosis with abdominal pain beyond proportion to exam concerning for acute mesenteric ischemia, complicated by pertinent comorbidities including:  Patient Active Problem List   Diagnosis Date Noted   Hypovolemic shock ([place]) 10/16/2022   Memory change 08/27/2022   History of right common carotid artery stent placement 06/12/2021   Pacemaker 06/12/2021   Weakness 04/30/2020   Hyponatremia 05/10/2019   Dog bite 01/21/2019   Stress 05/05/2017   Weakness of left arm 11/09/2016   H/O total knee replacement 03/24/2016   S/P total knee arthroplasty 48/54/6270   Chronic systolic CHF (congestive heart failure) ([place]) 12/13/2015   HLD (hyperlipidemia) 12/13/2015   BP (high blood pressure) 12/13/2015   Cardiomyopathy, ischemic 12/13/2015   MI (mitral incompetence) 12/13/2015   Billowing mitral valve 12/13/2015   History of ventricular tachycardia 12/13/2015   Carotid stenosis 12/13/2015   Syncope and collapse 12/13/2015   Disease of thyroid gland 12/13/2015   Health care maintenance 09/04/2015   Encounter for completion of form with patient 01/12/2015   Left arm numbness 09/30/2014   Ischemia of upper extremity 09/30/2014   Pins and needles sensation 09/30/2014   Unspecified visual disturbance 09/30/2014   Paresthesia of arm 09/30/2014   Primary osteoarthritis of both knees 09/29/2014   History of cardiac catheterization 05/12/2014   Other specified postprocedural states 05/12/2014   Change in vision 01/09/2014   Knee pain, bilateral 01/09/2014   Arthralgia of lower leg 01/09/2014   Left knee pain 09/13/2013   Hyperglycemia 11/05/2012   Carotid artery stenosis 11/05/2012   Carotid artery narrowing 11/05/2012    Carotid artery obstruction 11/05/2012   Abnormal blood sugar 11/05/2012   Hypothyroidism 10/31/2012   Pure hypercholesterolemia 10/31/2012   Dysphagia, unspecified(787.20) 10/31/2012   Coronary atherosclerosis of native coronary artery 10/31/2012   Anemia 10/31/2012   Essential hypertension, benign 10/31/2012   Absolute anemia 10/31/2012   Benign essential HTN 10/31/2012   Adult hypothyroidism 10/31/2012    -At least diagnostic laparoscopy is prudent, possible laparotomy may be necessary to completely evaluate the abdominal cavity.  -Risks of finding an irreversible process were discussed in detail, including the significant morbidity/mortality associated.  -Plan is to proceed to the OR as soon as sufficient lines are placed and patient is intubated, and improved hemodynamics stability for general anesthesia.  -Concur with broad-spectrum antibiotics.  All of the above findings and recommendations were discussed with the patient and  family(if present), and all of patient's and present family's questions were answered to their expressed satisfaction.  Thank you for the opportunity to participate in this patient's care.   -- Ronny Bacon, M.D., FACS 10/06/2022, 10:08 AM

## 2022-09-27 ENCOUNTER — Encounter: Payer: Self-pay | Admitting: Surgery

## 2022-09-27 ENCOUNTER — Other Ambulatory Visit: Payer: Self-pay

## 2022-09-27 ENCOUNTER — Inpatient Hospital Stay: Payer: Medicare Other

## 2022-09-27 ENCOUNTER — Inpatient Hospital Stay
Admit: 2022-09-27 | Discharge: 2022-09-27 | Disposition: A | Discharge status: Home / Self Care | Payer: Medicare Other | Attending: Pulmonary Disease | Admitting: Pulmonary Disease

## 2022-09-27 ENCOUNTER — Telehealth: Payer: Self-pay | Admitting: Internal Medicine

## 2022-09-27 DIAGNOSIS — E871 Hypo-osmolality and hyponatremia: Secondary | ICD-10-CM

## 2022-09-27 DIAGNOSIS — R579 Shock, unspecified: Secondary | ICD-10-CM | POA: Diagnosis not present

## 2022-09-27 LAB — BLOOD GAS, ARTERIAL
Acid-base deficit: 2.6 mmol/L — ABNORMAL HIGH (ref 0.0–2.0)
Bicarbonate: 19.4 mmol/L — ABNORMAL LOW (ref 20.0–28.0)
FIO2: 45 %
MECHVT: 620 mL
Mechanical Rate: 20
O2 Saturation: 98.2 %
PEEP: 5 cmH2O
Patient temperature: 37
pCO2 arterial: 26 mmHg — ABNORMAL LOW (ref 32–48)
pH, Arterial: 7.48 — ABNORMAL HIGH (ref 7.35–7.45)
pO2, Arterial: 125 mmHg — ABNORMAL HIGH (ref 83–108)

## 2022-09-27 LAB — BASIC METABOLIC PANEL
Anion gap: 12 (ref 5–15)
BUN: 38 mg/dL — ABNORMAL HIGH (ref 8–23)
CO2: 20 mmol/L — ABNORMAL LOW (ref 22–32)
Calcium: 7.3 mg/dL — ABNORMAL LOW (ref 8.9–10.3)
Chloride: 101 mmol/L (ref 98–111)
Creatinine, Ser: 1.46 mg/dL — ABNORMAL HIGH (ref 0.61–1.24)
GFR, Estimated: 48 mL/min — ABNORMAL LOW (ref >60)
Glucose, Bld: 127 mg/dL — ABNORMAL HIGH (ref 70–99)
Potassium: 3.9 mmol/L (ref 3.5–5.1)
Sodium: 133 mmol/L — ABNORMAL LOW (ref 135–145)

## 2022-09-27 LAB — GLUCOSE, CAPILLARY
Glucose-Capillary: 111 mg/dL — ABNORMAL HIGH (ref 70–99)
Glucose-Capillary: 116 mg/dL — ABNORMAL HIGH (ref 70–99)
Glucose-Capillary: 118 mg/dL — ABNORMAL HIGH (ref 70–99)
Glucose-Capillary: 130 mg/dL — ABNORMAL HIGH (ref 70–99)
Glucose-Capillary: 135 mg/dL — ABNORMAL HIGH (ref 70–99)
Glucose-Capillary: 98 mg/dL (ref 70–99)

## 2022-09-27 LAB — CBC
HCT: 35.6 % — ABNORMAL LOW (ref 39.0–52.0)
Hemoglobin: 12.5 g/dL — ABNORMAL LOW (ref 13.0–17.0)
MCH: 30.6 pg (ref 26.0–34.0)
MCHC: 35.1 g/dL (ref 30.0–36.0)
MCV: 87.3 fL (ref 80.0–100.0)
Platelets: 184 10*3/uL (ref 150–400)
RBC: 4.08 MIL/uL — ABNORMAL LOW (ref 4.22–5.81)
RDW: 13.4 % (ref 11.5–15.5)
WBC: 11.2 10*3/uL — ABNORMAL HIGH (ref 4.0–10.5)
nRBC: 0 % (ref 0.0–0.2)

## 2022-09-27 LAB — PROCALCITONIN: Procalcitonin: 23.82 ng/mL

## 2022-09-27 LAB — ECHOCARDIOGRAM COMPLETE
AR max vel: 2.16 cm2
AV Area VTI: 2.6 cm2
AV Area mean vel: 2.32 cm2
AV Mean grad: 5 mmHg
AV Peak grad: 10.9 mmHg
Ao pk vel: 1.65 m/s
Area-P 1/2: 1.78 cm2
Height: 72 in
MV VTI: 3.1 cm2
S' Lateral: 2.8 cm
Weight: 3185.21 oz

## 2022-09-27 LAB — HEPATIC FUNCTION PANEL
ALT: 17 U/L (ref 0–44)
AST: 68 U/L — ABNORMAL HIGH (ref 15–41)
Albumin: 3.2 g/dL — ABNORMAL LOW (ref 3.5–5.0)
Alkaline Phosphatase: 38 U/L (ref 38–126)
Bilirubin, Direct: 0.4 mg/dL — ABNORMAL HIGH (ref 0.0–0.2)
Indirect Bilirubin: 0.8 mg/dL (ref 0.3–0.9)
Total Bilirubin: 1.2 mg/dL (ref 0.3–1.2)
Total Protein: 5.4 g/dL — ABNORMAL LOW (ref 6.5–8.1)

## 2022-09-27 LAB — TROPONIN I (HIGH SENSITIVITY): Troponin I (High Sensitivity): 1486 ng/L (ref <18)

## 2022-09-27 LAB — TRIGLYCERIDES: Triglycerides: 41 mg/dL (ref <150)

## 2022-09-27 LAB — MAGNESIUM: Magnesium: 2.5 mg/dL — ABNORMAL HIGH (ref 1.7–2.4)

## 2022-09-27 LAB — PHOSPHORUS: Phosphorus: 5.2 mg/dL — ABNORMAL HIGH (ref 2.5–4.6)

## 2022-09-27 LAB — LACTIC ACID, PLASMA: Lactic Acid, Venous: 3.1 mmol/L (ref 0.5–1.9)

## 2022-09-27 MED ORDER — CITALOPRAM HYDROBROMIDE 20 MG PO TABS
20.0000 mg | ORAL_TABLET | Freq: Every day | ORAL | Status: DC
  Filled 2022-09-27: qty 1

## 2022-09-27 MED ORDER — VITAL 1.5 CAL PO LIQD
1000.0000 mL | ORAL | Status: DC
  Administered 2022-09-27: 1000 mL

## 2022-09-27 MED ORDER — DEXTROSE 50 % IV SOLN: 1.0000 | Freq: Once | INTRAVENOUS | Status: DC

## 2022-09-27 MED ORDER — CITALOPRAM HYDROBROMIDE 20 MG PO TABS
20.0000 mg | ORAL_TABLET | Freq: Every day | ORAL | Status: DC
  Administered 2022-09-27 – 2022-09-28 (×2): 20 mg
  Filled 2022-09-27: qty 1

## 2022-09-27 MED ORDER — FREE WATER
30.0000 mL | Status: DC
  Administered 2022-09-27 – 2022-09-29 (×11): 30 mL

## 2022-09-27 NOTE — Telephone Encounter (Signed)
Orders placed.

## 2022-09-27 NOTE — Consult Note (Signed)
PHARMACY CONSULT NOTE - ELECTROLYTES  Pharmacy Consult for Electrolyte Monitoring and Replacement   Recent Labs: Potassium (mmol/L)  Date Value  09/27/2022 3.9   Magnesium (mg/dL)  Date Value  09/27/2022 2.5 (H)   Calcium (mg/dL)  Date Value  09/27/2022 7.3 (L)   Albumin (g/dL)  Date Value  09/27/2022 3.2 (L)   Phosphorus (mg/dL)  Date Value  09/27/2022 5.2 (H)   Sodium (mmol/L)  Date Value  09/27/2022 133 (L)   Corrected Ca: 9.0 mg/dL  Assessment  Daniel Austin is a 82 y.o. male presenting with sepsis. PMH significant for anemia, anxiety, depression, CAD, ICM s/p PCI, HFrEF (2018 EF 45%), CVA, HTN, HLD, GERD, hypothyroidism. Pharmacy has been consulted to monitor and replace electrolytes.  Diet: NPO MIVF: Bicarb @ 100 mL/hr  Goal of Therapy: Electrolytes WNL  Plan:  Sodium: 131 >> 133, improving, continue to monitor Potassium: 4.2 >> 3.9, no replacement needed Magnesium: 2.6 >> 2.5, improving, continue to monitor Phosphorus: 6.0 >> 5.2, improving, continue to monitor Check BMP, Mg, Phos with AM labs  Thank you for allowing pharmacy to be a part of this patient's care.  Gretel Acre, PharmD PGY1 Pharmacy Resident 09/27/2022 9:49 AM

## 2022-09-27 NOTE — Progress Notes (Signed)
Pharmacy Antibiotic Note  Daniel Austin is a 82 y.o. male admitted on 09/30/2022 with sepsis. PMH significant for anemia, anxiety, depression, CAD, ICM s/p PCI, HFrEF (2018 EF 45%), CVA, HTN, HLD, GERD, hypothyroidism. Pharmacy has been consulted for cefepime dosing.  Plan: Day 2 of antibiotics Continue cefepime 2 g IV Q12H Patient is also on metronidazole 500 mg IV Q12H Continue to monitor renal function and follow culture results   Height: 6' (182.9 cm) Weight: 90.3 kg (199 lb 1.2 oz) IBW/kg (Calculated) : 77.6  Temp (24hrs), Avg:99.8 F (37.7 C), Min:98.6 F (37 C), Max:101.5 F (38.6 C)  Recent Labs  Lab 10/11/2022 0101 10/22/2022 0313 10/05/2022 0846 09/25/2022 1959 09/27/22 0437  WBC 17.3*  --   --  15.0* 11.2*  CREATININE 1.62*  --  1.92* 1.59* 1.46*  LATICACIDVEN 5.3* 5.9* 7.2* 3.5* 3.1*     Estimated Creatinine Clearance: 42.8 mL/min (A) (by C-G formula based on SCr of 1.46 mg/dL (H)).    Allergies  Allergen Reactions   Beta Adrenergic Blockers Other (See Comments)    Second Degree Heart Block   Lipitor [Atorvastatin] Other (See Comments)    Myalgia     Antimicrobials this admission: 1/3 Vancomycin x1 1/3 Cefepime >>  1/3 Metronidazole >>   Dose adjustments this admission: N/A  Microbiology results: 1/3 BCx: NG1D 1/3 UCx: IP  Thank you for allowing pharmacy to be a part of this patient's care.  Gretel Acre, PharmD PGY1 Pharmacy Resident 09/27/2022 9:49 AM

## 2022-09-27 NOTE — Progress Notes (Signed)
Pt extubated at this time. Tolerated well. Family at bedside.

## 2022-09-27 NOTE — Progress Notes (Signed)
NAME:  Daniel Austin, MRN:  [id number], DOB:  June 16, 1941, LOS: 1 ADMISSION DATE:  09/30/2022, CONSULTATION DATE:  09/30/2022 REFERRING MD:  Dr. Beather Arbour, CHIEF COMPLAINT:   Sepsis  History of Present Illness:  82 yo M presenting to [facility] ED from home via EMS on 10/08/2022 with a chief complaint of sepsis alert. History provided by daughter and spouse bedside as patient is altered. Patient was in his normal state of health until he took a laxative dose 2 days ago then had uncontrollable diarrhea.  Spouse also described nausea with dry heaves and some mild emesis with poor p.o. intake. She denied fever, chest pain & shortness of breath.  They reported he fell and when the daughter arrived to assist her mother he appeared tremulous and diaphoretic. Family denies EtOH use/ NSAID use. EMS reported being called out due to nausea/vomiting and diarrhea, and the patient had a syncopal episode when first responders stood him up.  EMS reported hypotension in the field patient received a liter NS bolus and was given 4 mg of IV Zofran prior to arrival.  ED course: Upon arrival patient confused and severely hypotensive with tachypnea.  Sepsis protocol initiated.  Labs significant for hyponatremia (possible acute on chronic), hyperglycemia, AKI, AGMA, transaminitis, leukocytosis with lactic acidosis and elevated troponin.  Imaging suspicious for UTI, also revealing severe gastritis.  Post total 2.5 L IV fluid resuscitation between EMS and ED patient remained hypotensive requiring low-dose peripheral vasopressor support. ABG: 7.30/ 22/67/10.8  CXR 10/05/2022: Low lung volumes without active cardiopulmonary disease CT head without contrast 10/24/2022: No acute intracranial abnormality.  Chronic right frontal parietal and right parietoccipital infarcts.  Generalized cerebral atrophy and microvascular disease changes of the supratentorial brain. CT angio chest PE 10/06/2022: No evidence of pulmonary embolism or other acute  intrathoracic process.  Marked severity coronary artery calcification, small anterior pericardial effusion. CT abdomen and pelvis with contrast 09/28/2022: Findings consistent with marked severity gastritis and/or duodenitis.  Mild to moderate severity diffuse enteritis.  Large amount of stool within the mid to distal sigmoid colon and rectum consistent with constipation.  Sigmoid diverticulosis, 3.8 cm x 3.7 cm x 3.8 cm infrarenal AAA.  7 cm x 4.8 cm left inguinal hernia.  Mild diffuse urinary bladder wall thickening which may represent cystitis.   PCCM consulted for admission due to circulatory shock secondary to hypovolemia and suspected sepsis in the setting of gastritis and possible UTI requiring peripheral vasopressor support.  Pertinent  Medical History  Anemia Anxiety & depression CAD ICM status post PCI HFrEF (echo 2018 LVEF 45%) CVA Heart block status post PPM 12/08 Hypertension Hypercholesterolemia GERD Hypothyroidism Gastric ulcer status post-repair Significant Hospital Events: Including procedures, antibiotic start and stop dates in addition to other pertinent events   10/17/2022: Admit to ICU with circulatory shock secondary to hypovolemia and sepsis POA in the setting of gastritis and possible UTI requiring peripheral vasopressor support. 1/3  Ischemic sigmoid colitis, likely complicated secondary to sliding left inguinal hernia.         EXPLORATORY LAPAROTOMY; SIGMOID COLECTOMY WITH END COLOSTOMY         CREATION (Abdomen)  1/4 severe resp failure, +septic shock  Interim History / Subjective:  Remains on vent Severe shock  Vent Mode: PRVC FiO2 (%):  [40 %-60 %] 40 % Set Rate:  [18 bmp-20 bmp] 18 bmp Vt Set:  [580 mL-620 mL] 580 mL PEEP:  [5 cmH20] 5 cmH20 Plateau Pressure:  [13 cmH20-17 cmH20] 14 cmH20   Objective  Blood pressure 106/69, pulse (!) 111, temperature 99.7 F (37.6 C), temperature source Esophageal, resp. rate 19, height 6' (1.829 m), weight 90.3 kg,  SpO2 100 %.    Vent Mode: PRVC FiO2 (%):  [40 %-60 %] 40 % Set Rate:  [18 bmp-20 bmp] 18 bmp Vt Set:  [580 mL-620 mL] 580 mL PEEP:  [5 cmH20] 5 cmH20 Plateau Pressure:  [13 cmH20-17 cmH20] 14 cmH20   Intake/Output Summary (Last 24 hours) at 09/27/2022 9767 Last data filed at 09/27/2022 3419 Gross per 24 hour  Intake 7431.57 ml  Output 1830 ml  Net 5601.57 ml    Filed Weights   10/06/2022 0117 09/27/22 0500  Weight: 79.4 kg 90.3 kg     REVIEW OF SYSTEMS  PATIENT IS UNABLE TO PROVIDE COMPLETE REVIEW OF SYSTEMS DUE TO SEVERE CRITICAL ILLNESS   PHYSICAL EXAMINATION:  GENERAL:critically ill appearing, +resp distress EYES: Pupils equal, round, reactive to light.  No scleral icterus.  MOUTH: Moist mucosal membrane. INTUBATED NECK: Supple.  PULMONARY: Lungs clear to auscultation, +rhonchi, +wheezing CARDIOVASCULAR: S1 and S2.  Regular rate and rhythm GASTROINTESTINAL: Soft, nontender, +distended.  +midline incision, stoma MUSCULOSKELETAL: No swelling, clubbing, or edema.  NEUROLOGIC: obtunded,sedated SKIN:normal, warm to touch, Capillary refill delayed  Pulses present bilaterally    Assessment & Plan:  82 yo white male with acute Circulatory shock multifocal secondary to suspected hypovolemia & severe sepsis POA due to ischemic colitis PMHx: gastric ulcer s/p remote repair  Severe ACUTE Hypoxic and Hypercapnic Respiratory Failure -continue Mechanical Ventilator support -Wean Fio2 and PEEP as tolerated -VAP/VENT bundle implementation - Wean PEEP & FiO2 as tolerated, maintain SpO2 > 88% - Head of bed elevated 30 degrees, VAP protocol in place - Plateau pressures less than 30 cm H20  - Intermittent chest x-ray & ABG PRN - Ensure adequate pulmonary hygiene  -will perform SAT/SBT when respiratory parameters are met  ACUTE KIDNEY INJURY/Renal Failure -continue Foley Catheter-assess need -Avoid nephrotoxic agents -Follow urine output, BMP -Ensure adequate renal perfusion,  optimize oxygenation -Renal dose medications   Intake/Output Summary (Last 24 hours) at 09/27/2022 0813 Last data filed at 09/27/2022 3790 Gross per 24 hour  Intake 7431.57 ml  Output 2030 ml  Net 5401.57 ml      Latest Ref Rng & Units 09/27/2022    4:37 AM 10/14/2022    7:59 PM 09/25/2022   12:12 PM  BMP  Glucose 70 - 99 mg/dL 127  158    BUN 8 - 23 mg/dL 38  42    Creatinine 0.61 - 1.24 mg/dL 1.46  1.59    Sodium 135 - 145 mmol/L 133  131  132   Potassium 3.5 - 5.1 mmol/L 3.9  4.2    Chloride 98 - 111 mmol/L 101  102    CO2 22 - 32 mmol/L 20  15    Calcium 8.9 - 10.3 mg/dL 7.3  7.5      Elevated Troponin secondary to  demand ischemia Small pericardial effusion PMHx: CAD, HFrEF, HTN - Trend troponins  - Echocardiogram ordered - continuous cardiac monitoring - outpatient regimen on hold due to hypotension, consider restarting as patient stabilizes: metoprolol, amlodipine, lisnopril - Consult cardiology if needed  Hypothyroidism - continue outpatient Synthroid - f/u TSH  Acute Encephalopathy suspect metabolic  PMHx: anxiety & depression, CVA - supportive care - continue outpatient: ASA, citalopram, clopidogrel - consider ativan PRN if patient becomes agitated  PROGNOSIS IS GUARDED FOLLOW UP [place] (right  click and "Reselect all SmartList Selections" daily)  Diet/type: NPO w/ oral meds DVT prophylaxis: LMWH GI prophylaxis: PPI Lines: N/A Foley:  N/A Code Status:  full code   Labs   CBC: Recent Labs  Lab 10/21/2022 0101 10/06/2022 1959 09/27/22 0437  WBC 17.3* 15.0* 11.2*  NEUTROABS 14.9*  --   --   HGB 13.8 13.5 12.5*  HCT 41.4 38.7* 35.6*  MCV 90.0 86.8 87.3  PLT 301 228 184     Basic Metabolic Panel: Recent Labs  Lab 10/23/2022 0101 10/07/2022 0313 10/16/2022 0846 10/10/2022 1212 10/14/2022 1959 09/27/22 0437  NA 126*  --  131* 132* 131* 133*  K 3.6  --  4.0  --  4.2 3.9  CL 100  --  100  --  102 101  CO2 10*  --  14*  --   15* 20*  GLUCOSE 214*  --  159*  --  158* 127*  BUN 26*  --  37*  --  42* 38*  CREATININE 1.62*  --  1.92*  --  1.59* 1.46*  CALCIUM 8.9  --  8.7*  --  7.5* 7.3*  MG  --  2.3  --   --  2.6* 2.5*  PHOS  --  4.3  --   --  6.0* 5.2*    GFR: Estimated Creatinine Clearance: 42.8 mL/min (A) (by C-G formula based on SCr of 1.46 mg/dL (H)). Recent Labs  Lab 10/17/2022 0101 10/24/2022 0313 09/27/2022 0846 10/08/2022 1959 09/27/22 0437  PROCALCITON <0.10  --   --   --  23.82  WBC 17.3*  --   --  15.0* 11.2*  LATICACIDVEN 5.3* 5.9* 7.2* 3.5* 3.1*     Liver Function Tests: Recent Labs  Lab 10/06/2022 0101 10/24/2022 1959 09/27/22 0437  AST 42* 66* 68*  ALT _0 ALKPHOS 70 46 38  BILITOT 1.2 1.1 1.2  PROT 6.6 5.4* 5.4*  ALBUMIN 3.9 3.2* 3.2*    Recent Labs  Lab 10/13/2022 0101  LIPASE 61*    No results for input(s): "AMMONIA" in the last 168 hours.  ABG    Component Value Date/Time   PHART 7.48 (H) 09/27/2022 0437   PCO2ART 26 (L) 09/27/2022 0437   PO2ART 125 (H) 09/27/2022 0437   HCO3 19.4 (L) 09/27/2022 0437   ACIDBASEDEF 2.6 (H) 09/27/2022 0437   O2SAT 98.2 09/27/2022 0437     Coagulation Profile: Recent Labs  Lab 10/08/2022 2111  INR 2.0*    Cardiac Enzymes: No results for input(s): "CKTOTAL", "CKMB", "CKMBINDEX", "TROPONINI" in the last 168 hours.  HbA1C: Hgb A1c MFr Bld  Date/Time Value Ref Range Status  08/29/2022 10:16 AM 6.1 4.6 - 6.5 % Final    Comment:    Glycemic Control Guidelines for People with Diabetes:Non Diabetic:  <6%Goal of Therapy: <7%Additional Action Suggested:  >8%   03/14/2021 11:51 AM 6.1 4.6 - 6.5 % Final    Comment:    Glycemic Control Guidelines for People with Diabetes:Non Diabetic:  <6%Goal of Therapy: <7%Additional Action Suggested:  >8%     CBG: Recent Labs  Lab 10/04/2022 1634 10/08/2022 2004 09/29/2022 2349 09/27/22 0307 09/27/22 0737  GLUCAP 147* 157* 147* 135* 118*     DVT/GI PRX  assessed I Assessed the need for  Labs I Assessed the need for Foley I Assessed the need for Central Venous Line Family Discussion when available I Assessed the need for Mobilization I made an Assessment of medications to be adjusted accordingly  Safety Risk assessment completed  CASE DISCUSSED IN MULTIDISCIPLINARY ROUNDS WITH ICU TEAM     Critical Care Time devoted to patient care services described in this note is 55 minutes.  Critical care was necessary to treat /prevent imminent and life-threatening deterioration. Overall, patient is critically ill, prognosis is guarded.  Patient with Multiorgan failure and at high risk for cardiac arrest and death.    Corrin Parker, M.D.  [facility]  Medical Director [place] Director [facility] Cardio-Pulmonary Department

## 2022-09-27 NOTE — Progress Notes (Signed)
[place] Hospital Day(s): 1.   Post op day(s): 1 Day Post-Op.   Interval History: Patient seen and examined, daughter at bedside, no acute events reported overnight. Patient remains intubated, sedated on propofol.  Currently on 2 vasopressors, sodium bicarbonate.  T max 101.36F     Review of Systems:  Not obtainable, patient intubated.  Vital signs in last 24 hours: [min-max] current  Temp:  [98.6 F (37 C)-101.5 F (38.6 C)] 99.7 F (37.6 C) (01/04 0800) Pulse Rate:  [73-118] 111 (01/04 0800) Resp:  [0-40] 19 (01/04 0800) BP: (79-189)/(51-173) 106/69 (01/04 0800) SpO2:  [93 %-100 %] 100 % (01/04 0800) Arterial Line BP: (65-161)/(47-83) 112/53 (01/04 0800) FiO2 (%):  [40 %-60 %] 40 % (01/04 0800) Weight:  [90.3 kg] 90.3 kg (01/04 0500)     Height: 6' (182.9 cm) Weight: 90.3 kg BMI (Calculated): 26.99   Intake/Output last 2 shifts:  01/03 0701 - 01/04 0700 In: 7691.2 [I.V.:3841.9; IV Piggyback:3849.3] Out: 4825 [Urine:1735; Stool:75; Blood:20]   Physical Exam:  Constitutional: Sedated, intubated, no distress  Respiratory: breathing on ventilator. Cardiovascular: Well-perfused, warm extremities. Gastrointestinal: Distal-most of none matured colostomy, slightly discolored, still pink/viable nearer the point of exiting the abdominal wall.  Otherwise soft, non-tender, and non-distended Integumentary: Extremities warm, no remarkable edema.  Honeycomb dressing intact over midline incision.  Labs:     Latest Ref Rng & Units 09/27/2022    4:37 AM 09/29/2022    7:59 PM 09/30/2022    1:01 AM  CBC  WBC 4.0 - 10.5 K/uL 11.2  15.0  17.3   Hemoglobin 13.0 - 17.0 g/dL 12.5  13.5  13.8   Hematocrit 39.0 - 52.0 % 35.6  38.7  41.4   Platelets 150 - 400 K/uL 184  228  301       Latest Ref Rng & Units 09/27/2022    4:37 AM 10/14/2022    7:59 PM 10/13/2022   12:12 PM  CMP  Glucose 70 - 99 mg/dL 127  158    BUN 8 - 23 mg/dL 38  42    Creatinine 0.61 -  1.24 mg/dL 1.46  1.59    Sodium 135 - 145 mmol/L 133  131  132   Potassium 3.5 - 5.1 mmol/L 3.9  4.2    Chloride 98 - 111 mmol/L 101  102    CO2 22 - 32 mmol/L 20  15    Calcium 8.9 - 10.3 mg/dL 7.3  7.5    Total Protein 6.5 - 8.1 g/dL 5.4  5.4    Total Bilirubin 0.3 - 1.2 mg/dL 1.2  1.1    Alkaline Phos 38 - 126 U/L 38  46    AST 15 - 41 U/L 68  66    ALT 0 - 44 U/L 17  15       Imaging studies: No new pertinent imaging studies   Assessment/Plan:  82 y.o. male with  1 Day Post-Op s/p sigmoid colectomy for ischemic changes likely secondary to left inguinal hernia, complicated by pertinent comorbidities including:  Patient Active Problem List   Diagnosis Date Noted   Shock circulatory ([place]) 10/22/2022   Gangrenous ischemic colitis ([place]) 09/25/2022   Incarcerated left inguinal hernia 10/04/2022   Sepsis ([place]) 10/08/2022   Lactic acidosis 09/25/2022   Memory change 08/27/2022   History of right common carotid artery stent placement 06/12/2021   Pacemaker 06/12/2021   Weakness 04/30/2020   Hyponatremia 05/10/2019   Dog bite  01/21/2019   Stress 05/05/2017   Weakness of left arm 11/09/2016   H/O total knee replacement 03/24/2016   S/P total knee arthroplasty 01/41/0301   Chronic systolic CHF (congestive heart failure) ([place]) 12/13/2015   HLD (hyperlipidemia) 12/13/2015   BP (high blood pressure) 12/13/2015   Cardiomyopathy, ischemic 12/13/2015   MI (mitral incompetence) 12/13/2015   Billowing mitral valve 12/13/2015   History of ventricular tachycardia 12/13/2015   Carotid stenosis 12/13/2015   Syncope and collapse 12/13/2015   Disease of thyroid gland 12/13/2015   Health care maintenance 09/04/2015   Encounter for completion of form with patient 01/12/2015   Left arm numbness 09/30/2014   Ischemia of upper extremity 09/30/2014   Pins and needles sensation 09/30/2014   Unspecified visual disturbance 09/30/2014   Paresthesia of arm 09/30/2014   Primary osteoarthritis of  both knees 09/29/2014   History of cardiac catheterization 05/12/2014   Other specified postprocedural states 05/12/2014   Change in vision 01/09/2014   Knee pain, bilateral 01/09/2014   Arthralgia of lower leg 01/09/2014   Left knee pain 09/13/2013   Hyperglycemia 11/05/2012   Carotid artery stenosis 11/05/2012   Carotid artery narrowing 11/05/2012   Carotid artery obstruction 11/05/2012   Abnormal blood sugar 11/05/2012   Hypothyroidism 10/31/2012   Pure hypercholesterolemia 10/31/2012   Dysphagia, unspecified(787.20) 10/31/2012   Coronary atherosclerosis of native coronary artery 10/31/2012   Anemia 10/31/2012   Essential hypertension, benign 10/31/2012   Absolute anemia 10/31/2012   Benign essential HTN 10/31/2012   Adult hypothyroidism 10/31/2012    -May initiate trickle feeds.  -Will likely open end of colostomy/initiate maturation tomorrow.  -Appreciate CCM's care.  -Continue IV antibiotics, PPI prophylaxis, DVT prophylaxis.  I do not anticipate return to the OR at this time.  All of the above findings and recommendations were discussed with the patient's daughter, and all of her questions were answered to apparent satisfaction.  -- Ronny Bacon, M.D., [facility] 09/27/2022

## 2022-09-27 NOTE — Telephone Encounter (Signed)
Patient has a lab appt 10/02/2022, there are no orders in.

## 2022-09-27 NOTE — Progress Notes (Signed)
Initial Nutrition Assessment  DOCUMENTATION CODES:   Non-severe (moderate) malnutrition in context of social or environmental circumstances  INTERVENTION:   Vital 1.5'@65ml'$ /hr- Initiate at 13m/hr, once tolerating, advance by 113mhr q 8 hours until goal rate is reached.   ProSource TF 20- Give 6019maily via tube, each supplement provides 80kcal and 20g of protein.   Free water flushes 69m44m hours to maintain tube patency   Regimen provides 2420kcal/day, 125g/day protein and 1372ml22m of free water  Pt at high refeed risk; recommend monitor potassium, magnesium and phosphorus labs daily until stable  Daily weights   NUTRITION DIAGNOSIS:   Moderate Malnutrition related to social / environmental circumstances (advanced age) as evidenced by mild fat depletion, moderate muscle depletion.  GOAL:   Patient will meet greater than or equal to 90% of their needs  MONITOR:   Vent status, Labs, Weight trends, TF tolerance, Skin, I & O's  REASON FOR ASSESSMENT:   Ventilator    ASSESSMENT:   81 y/73male with h/o cardiomyopathy, hypothyroidism, CAD, HTN, CHF, HLD, MI, pacemaker, PUD, GERD, depression and stroke who is admitted with septic shock secondary to ischemic sigmoid colitis from sliding left inguinal hernia now s/p exploratory laparotomy with sigmoid resection with end colostomy 1/3.  Pt sedated and ventilated. OGT in place and is clamped. Plan is for possible extubation today. RN to exchange OGT out for and NGT. Will initiate trickle tube feeds today. Pt is at high refeed risk. Pt is having some ostomy function today. No abdominal distension noted. Per chart, pt appears to be weight stable at baseline.   Medications reviewed and include: celexa, lovenox, insulin, synthroid, protonix, albumin, cefepime, metronidazole, levophed, neo-synephrine, propofol, vasopressin   Labs reviewed: Na 133(L), K 3.9 wnl, BUN 38(H), creat 1.46(H), P 5.2(H), Mg 2.5(H) Wbc- 11.2(H) Cbgs- 111,  118, 135 x 24 hrs  AIC 6.1- 12/6  Patient is currently intubated on ventilator support MV: 10.5 L/min Temp (24hrs), Avg:99.7 F (37.6 C), Min:98.6 F (37 C), Max:100.4 F (38 C)  Propofol: 2.38 ml/hr  MAP- >65mmH46mUOP- 1735ml  85mRITION - FOCUSED PHYSICAL EXAM:  Flowsheet Row Most Recent Value  Orbital Region Mild depletion  Upper Arm Region Severe depletion  Thoracic and Lumbar Region Mild depletion  Buccal Region Mild depletion  Temple Region Moderate depletion  Clavicle Bone Region Moderate depletion  Clavicle and Acromion Bone Region Moderate depletion  Scapular Bone Region Moderate depletion  Dorsal Hand Unable to assess  Patellar Region Moderate depletion  Anterior Thigh Region Moderate depletion  Posterior Calf Region Moderate depletion  Edema (RD Assessment) None  Hair Reviewed  Eyes Reviewed  Mouth Reviewed  Skin Reviewed  Nails Reviewed   Diet Order:   Diet Order             Diet NPO time specified  Diet effective now                  EDUCATION NEEDS:   No education needs have been identified at this time  Skin:  Skin Assessment: Reviewed RN Assessment (incision abdomen)  Last BM:  1/4- type 6- 75ml [id number]  Height:   Ht Readings from Last 1 Encounters:  09/24/2022 6' (1.829 m)    Weight:   Wt Readings from Last 1 Encounters:  09/27/22 90.3 kg    Ideal Body Weight:  80.9 kg  BMI:  Body mass index is 27 kg/m.  Estimated Nutritional Needs:   Kcal:  2200-2500kcal/day  Protein:  110-125g/day  Fluid:  2.0-2.3L/day  Koleen Distance MS, RD, LDN Please refer to [facility] for RD and/or RD on-call/weekend/after hours pager

## 2022-09-27 NOTE — Procedures (Signed)
Extubation Procedure Note  Patient Details:   Name: Daniel Austin DOB: 1941/09/04 MRN: [id number]   Airway Documentation:    Vent end date: 09/27/22 Vent end time: 1340   Evaluation  O2 sats: stable throughout Complications: No apparent complications Patient did tolerate procedure well. Bilateral Breath Sounds: Clear, Diminished   Yes able to cough and speak  Marlane Mingle 09/27/2022, 1:45 PM

## 2022-09-27 NOTE — Progress Notes (Signed)
*  PRELIMINARY RESULTS* Echocardiogram 2D Echocardiogram has been performed.  [name] 09/27/2022, 11:08 AM

## 2022-09-28 DIAGNOSIS — K4030 Unilateral inguinal hernia, with obstruction, without gangrene, not specified as recurrent: Secondary | ICD-10-CM | POA: Diagnosis not present

## 2022-09-28 DIAGNOSIS — E872 Acidosis, unspecified: Secondary | ICD-10-CM | POA: Diagnosis not present

## 2022-09-28 DIAGNOSIS — K55049 Acute infarction of large intestine, extent unspecified: Secondary | ICD-10-CM | POA: Diagnosis not present

## 2022-09-28 DIAGNOSIS — R579 Shock, unspecified: Secondary | ICD-10-CM | POA: Diagnosis not present

## 2022-09-28 DIAGNOSIS — E440 Moderate protein-calorie malnutrition: Secondary | ICD-10-CM

## 2022-09-28 LAB — CBC
HCT: 32.1 % — ABNORMAL LOW (ref 39.0–52.0)
Hemoglobin: 11.2 g/dL — ABNORMAL LOW (ref 13.0–17.0)
MCH: 30.3 pg (ref 26.0–34.0)
MCHC: 34.9 g/dL (ref 30.0–36.0)
MCV: 86.8 fL (ref 80.0–100.0)
Platelets: 139 10*3/uL — ABNORMAL LOW (ref 150–400)
RBC: 3.7 MIL/uL — ABNORMAL LOW (ref 4.22–5.81)
RDW: 13.7 % (ref 11.5–15.5)
WBC: 8.3 10*3/uL (ref 4.0–10.5)
nRBC: 0 % (ref 0.0–0.2)

## 2022-09-28 LAB — CBC WITH DIFFERENTIAL/PLATELET
Abs Immature Granulocytes: 0.04 10*3/uL (ref 0.00–0.07)
Basophils Absolute: 0.1 10*3/uL (ref 0.0–0.1)
Basophils Relative: 1 %
Eosinophils Absolute: 0 10*3/uL (ref 0.0–0.5)
Eosinophils Relative: 0 %
HCT: 31.2 % — ABNORMAL LOW (ref 39.0–52.0)
Hemoglobin: 10.9 g/dL — ABNORMAL LOW (ref 13.0–17.0)
Immature Granulocytes: 0 %
Lymphocytes Relative: 3 %
Lymphs Abs: 0.3 10*3/uL — ABNORMAL LOW (ref 0.7–4.0)
MCH: 30.4 pg (ref 26.0–34.0)
MCHC: 34.9 g/dL (ref 30.0–36.0)
MCV: 87.2 fL (ref 80.0–100.0)
Monocytes Absolute: 0.6 10*3/uL (ref 0.1–1.0)
Monocytes Relative: 7 %
Neutro Abs: 7.9 10*3/uL — ABNORMAL HIGH (ref 1.7–7.7)
Neutrophils Relative %: 89 %
Platelets: 147 10*3/uL — ABNORMAL LOW (ref 150–400)
RBC: 3.58 MIL/uL — ABNORMAL LOW (ref 4.22–5.81)
RDW: 13.7 % (ref 11.5–15.5)
WBC: 9 10*3/uL (ref 4.0–10.5)
nRBC: 0 % (ref 0.0–0.2)

## 2022-09-28 LAB — PROCALCITONIN: Procalcitonin: 19.94 ng/mL

## 2022-09-28 LAB — BASIC METABOLIC PANEL
Anion gap: 10 (ref 5–15)
BUN: 36 mg/dL — ABNORMAL HIGH (ref 8–23)
CO2: 25 mmol/L (ref 22–32)
Calcium: 6.9 mg/dL — ABNORMAL LOW (ref 8.9–10.3)
Chloride: 97 mmol/L — ABNORMAL LOW (ref 98–111)
Creatinine, Ser: 1.12 mg/dL (ref 0.61–1.24)
GFR, Estimated: >60 mL/min (ref >60)
Glucose, Bld: 139 mg/dL — ABNORMAL HIGH (ref 70–99)
Potassium: 2.8 mmol/L — ABNORMAL LOW (ref 3.5–5.1)
Sodium: 132 mmol/L — ABNORMAL LOW (ref 135–145)

## 2022-09-28 LAB — GLUCOSE, CAPILLARY
Glucose-Capillary: 123 mg/dL — ABNORMAL HIGH (ref 70–99)
Glucose-Capillary: 147 mg/dL — ABNORMAL HIGH (ref 70–99)
Glucose-Capillary: 137 mg/dL — ABNORMAL HIGH (ref 70–99)
Glucose-Capillary: 140 mg/dL — ABNORMAL HIGH (ref 70–99)
Glucose-Capillary: 128 mg/dL — ABNORMAL HIGH (ref 70–99)
Glucose-Capillary: 128 mg/dL — ABNORMAL HIGH (ref 70–99)

## 2022-09-28 LAB — URINE CULTURE: Culture: NO GROWTH

## 2022-09-28 LAB — POTASSIUM
Potassium: 3.3 mmol/L — ABNORMAL LOW (ref 3.5–5.1)
Potassium: 3.5 mmol/L (ref 3.5–5.1)

## 2022-09-28 LAB — MAGNESIUM: Magnesium: 2.4 mg/dL (ref 1.7–2.4)

## 2022-09-28 LAB — LACTIC ACID, PLASMA
Lactic Acid, Venous: 2.2 mmol/L (ref 0.5–1.9)
Lactic Acid, Venous: 2.1 mmol/L (ref 0.5–1.9)

## 2022-09-28 LAB — PHOSPHORUS: Phosphorus: 2.8 mg/dL (ref 2.5–4.6)

## 2022-09-28 LAB — SURGICAL PATHOLOGY

## 2022-09-28 MED ORDER — POTASSIUM CHLORIDE 20 MEQ PO PACK
40.0000 meq | PACK | Freq: Once | ORAL | Status: AC
  Administered 2022-09-28: 40 meq
  Filled 2022-09-28: qty 2

## 2022-09-28 MED ORDER — ACETAMINOPHEN 10 MG/ML IV SOLN
1000.0000 mg | Freq: Four times a day (QID) | INTRAVENOUS | Status: AC | PRN
  Administered 2022-09-28: 1000 mg via INTRAVENOUS
  Filled 2022-09-28: qty 100

## 2022-09-28 MED ORDER — LACTATED RINGERS IV SOLN: INTRAVENOUS | Status: AC

## 2022-09-28 MED ORDER — ORAL CARE MOUTH RINSE: 15.0000 mL | OROMUCOSAL | Status: DC | PRN

## 2022-09-28 MED ORDER — POTASSIUM CHLORIDE 10 MEQ/50ML IV SOLN
10.0000 meq | INTRAVENOUS | Status: DC
  Filled 2022-09-28 (×6): qty 50

## 2022-09-28 MED ORDER — PANTOPRAZOLE SODIUM 40 MG IV SOLR
40.0000 mg | INTRAVENOUS | Status: DC
  Administered 2022-09-29 – 2022-09-30 (×2): 40 mg via INTRAVENOUS
  Filled 2022-09-28 (×2): qty 10

## 2022-09-28 MED ORDER — POTASSIUM CHLORIDE 10 MEQ/50ML IV SOLN
10.0000 meq | INTRAVENOUS | Status: AC
  Administered 2022-09-28 (×3): 10 meq via INTRAVENOUS
  Filled 2022-09-28 (×4): qty 50

## 2022-09-28 MED ORDER — ORAL CARE MOUTH RINSE
15.0000 mL | OROMUCOSAL | Status: DC
  Administered 2022-09-28 – 2022-10-01 (×10): 15 mL via OROMUCOSAL

## 2022-09-28 MED ORDER — LACTATED RINGERS IV BOLUS
250.0000 mL | Freq: Once | INTRAVENOUS | Status: AC
  Administered 2022-09-28: 250 mL via INTRAVENOUS

## 2022-09-28 MED ORDER — PROSOURCE TF20 ENFIT COMPATIBL EN LIQD: 60.0000 mL | Freq: Every day | ENTERAL | Status: DC

## 2022-09-28 MED ORDER — MORPHINE SULFATE (PF) 2 MG/ML IV SOLN
1.0000 mg | INTRAVENOUS | Status: DC | PRN
  Administered 2022-09-28 (×2): 2 mg via INTRAVENOUS
  Administered 2022-09-28: 1 mg via INTRAVENOUS
  Administered 2022-09-29 – 2022-10-01 (×4): 2 mg via INTRAVENOUS
  Filled 2022-09-28 (×7): qty 1

## 2022-09-28 MED ORDER — CALCIUM GLUCONATE-NACL 1-0.675 GM/50ML-% IV SOLN
1.0000 g | Freq: Once | INTRAVENOUS | Status: AC
  Administered 2022-09-28: 1000 mg via INTRAVENOUS
  Filled 2022-09-28: qty 50

## 2022-09-28 MED ORDER — POTASSIUM CHLORIDE 20 MEQ PO PACK
40.0000 meq | PACK | ORAL | Status: AC
  Administered 2022-09-28 – 2022-09-29 (×2): 40 meq
  Filled 2022-09-28 (×2): qty 2

## 2022-09-28 MED ORDER — POTASSIUM CHLORIDE 10 MEQ/50ML IV SOLN
10.0000 meq | INTRAVENOUS | Status: DC
  Administered 2022-09-28: 10 meq via INTRAVENOUS
  Filled 2022-09-28 (×4): qty 50

## 2022-09-28 MED ORDER — VITAL 1.5 CAL PO LIQD: 1000.0000 mL | ORAL | Status: DC

## 2022-09-28 NOTE — Progress Notes (Signed)
Pharmacy Antibiotic Note  Daniel Austin is a 82 y.o. male admitted on 10/11/2022 with sepsis. PMH significant for anemia, anxiety, depression, CAD, ICM s/p PCI, HFrEF (2018 EF 45%), CVA, HTN, HLD, GERD, hypothyroidism. He underwent sigmoid colectomy for ischemic changes likely secondary to left inguinal hernia on 1/3. Pharmacy has been consulted for cefepime dosing.  Plan: Day 3 of antibiotics Continue cefepime 2 g IV Q12H Patient is also on metronidazole 500 mg IV Q12H Continue to monitor renal function and follow culture results   Height: 6' (182.9 cm) Weight: 90.7 kg (199 lb 15.3 oz) IBW/kg (Calculated) : 77.6  Temp (24hrs), Avg:99.4 F (37.4 C), Min:99 F (37.2 C), Max:99.9 F (37.7 C)  Recent Labs  Lab 10/06/2022 0101 10/12/2022 0313 09/27/2022 0846 10/23/2022 1959 09/27/22 0437 09/28/22 0438  WBC 17.3*  --   --  15.0* 11.2* 8.3  CREATININE 1.62*  --  1.92* 1.59* 1.46* 1.12  LATICACIDVEN 5.3* 5.9* 7.2* 3.5* 3.1* 2.2*     Estimated Creatinine Clearance: 55.8 mL/min (by C-G formula based on SCr of 1.12 mg/dL).    Allergies  Allergen Reactions   Beta Adrenergic Blockers Other (See Comments)    Second Degree Heart Block   Lipitor [Atorvastatin] Other (See Comments)    Myalgia     Antimicrobials this admission: 1/3 Vancomycin x1 1/3 Cefepime >>  1/3 Metronidazole >>   Dose adjustments this admission: N/A  Microbiology results: 1/3 BCx: NG2D 1/3 UCx: NG final  Thank you for allowing pharmacy to be a part of this patient's care.  Gretel Acre, PharmD PGY1 Pharmacy Resident 09/28/2022 9:03 AM

## 2022-09-28 NOTE — Consult Note (Incomplete)
NAME:  Daniel Austin, MRN:  [id number], DOB:  09-01-1941, LOS: 2 ADMISSION DATE:  09/30/2022, CONSULTATION DATE:  *** REFERRING MD:  ***, CHIEF COMPLAINT:  ***   History of Present Illness:  ***  Pertinent  Medical History  ***  Significant Hospital Events: Including procedures, antibiotic start and stop dates in addition to other pertinent events     Interim History / Subjective:  ***  Objective   Blood pressure (!) 128/55, pulse (!) 108, temperature 99.6 F (37.6 C), temperature source Oral, resp. rate (!) 26, height 6' (1.829 m), weight 90.7 kg, SpO2 97 %.    Vent Mode: PSV;CPAP FiO2 (%):  [30 %-40 %] 30 % PEEP:  [5 cmH20] 5 cmH20 Pressure Support:  [5 cmH20] 5 cmH20   Intake/Output Summary (Last 24 hours) at 09/28/2022 1142 Last data filed at 09/28/2022 1128 Gross per 24 hour  Intake 4520.42 ml  Output 2025 ml  Net 2495.42 ml   Filed Weights   10/18/2022 0117 09/27/22 0500 09/28/22 0500  Weight: 79.4 kg 90.3 kg 90.7 kg    Examination: General: *** HENT: *** Lungs: *** Cardiovascular: *** Abdomen: *** Extremities: *** Neuro: *** GU: ***  Resolved Hospital Problem list   ***  Assessment & Plan:  ***  Best Practice (right click and "Reselect all SmartList Selections" daily)   Diet/type: {diet type:25684} DVT prophylaxis: {anticoagulation (Optional):25687} GI prophylaxis: {DJ:57017} Lines: {Central Venous Access:25771} Foley:  {Central Venous Access:25691} Code Status:  {Code Status:26939} Last date of multidisciplinary goals of care discussion [***]  Labs   CBC: Recent Labs  Lab 10/08/2022 0101 10/05/2022 1959 09/27/22 0437 09/28/22 0438  WBC 17.3* 15.0* 11.2* 8.3  NEUTROABS 14.9*  --   --   --   HGB 13.8 13.5 12.5* 11.2*  HCT 41.4 38.7* 35.6* 32.1*  MCV 90.0 86.8 87.3 86.8  PLT 301 228 184 139*    Basic Metabolic Panel: Recent Labs  Lab 10/08/2022 0101 09/25/2022 0313 10/14/2022 0846 10/24/2022 1212 09/29/2022 1959 09/27/22 0437 09/28/22 0438   NA 126*  --  131* 132* 131* 133* 132*  K 3.6  --  4.0  --  4.2 3.9 2.8*  CL 100  --  100  --  102 101 97*  CO2 10*  --  14*  --  15* 20* 25  GLUCOSE 214*  --  159*  --  158* 127* 139*  BUN 26*  --  37*  --  42* 38* 36*  CREATININE 1.62*  --  1.92*  --  1.59* 1.46* 1.12  CALCIUM 8.9  --  8.7*  --  7.5* 7.3* 6.9*  MG  --  2.3  --   --  2.6* 2.5* 2.4  PHOS  --  4.3  --   --  6.0* 5.2* 2.8   GFR: Estimated Creatinine Clearance: 55.8 mL/min (by C-G formula based on SCr of 1.12 mg/dL). Recent Labs  Lab 10/20/2022 0101 10/11/2022 0313 09/27/2022 0846 10/09/2022 1959 09/27/22 0437 09/28/22 0438  PROCALCITON <0.10  --   --   --  23.82 19.94  WBC 17.3*  --   --  15.0* 11.2* 8.3  LATICACIDVEN 5.3*   < > 7.2* 3.5* 3.1* 2.2*   < > = values in this interval not displayed.    Liver Function Tests: Recent Labs  Lab 10/03/2022 0101 09/25/2022 1959 09/27/22 0437  AST 42* 66* 68*  ALT '13 15 17  '$ ALKPHOS 70 46 38  BILITOT 1.2 1.1 1.2  PROT 6.6  5.4* 5.4*  ALBUMIN 3.9 3.2* 3.2*   Recent Labs  Lab 10/02/2022 0101  LIPASE 61*   No results for input(s): "AMMONIA" in the last 168 hours.  ABG    Component Value Date/Time   PHART 7.46 (H) 09/27/2022 0838   PCO2ART 30 (L) 09/27/2022 0838   PO2ART 101 09/27/2022 0838   HCO3 21.3 09/27/2022 0838   ACIDBASEDEF 1.5 09/27/2022 0838   O2SAT 97.6 09/27/2022 0838     Coagulation Profile: Recent Labs  Lab 10/04/2022 2111  INR 2.0*    Cardiac Enzymes: No results for input(s): "CKTOTAL", "CKMB", "CKMBINDEX", "TROPONINI" in the last 168 hours.  HbA1C: Hgb A1c MFr Bld  Date/Time Value Ref Range Status  08/29/2022 10:16 AM 6.1 4.6 - 6.5 % Final    Comment:    Glycemic Control Guidelines for People with Diabetes:Non Diabetic:  <6%Goal of Therapy: <7%Additional Action Suggested:  >8%   03/14/2021 11:51 AM 6.1 4.6 - 6.5 % Final    Comment:    Glycemic Control Guidelines for People with Diabetes:Non Diabetic:  <6%Goal of Therapy: <7%Additional Action  Suggested:  >8%     CBG: Recent Labs  Lab 09/27/22 1931 09/27/22 2347 09/28/22 0440 09/28/22 0746 09/28/22 1134  GLUCAP 116* 130* 123* 147* 137*    Review of Systems:   ***  Past Medical History:  He,  has a past medical history of Anemia, Anxiety, Arthritis, CAD (coronary artery disease), Cardiomyopathy ([place]), Carotid artery stenosis, CHF (congestive heart failure) ([place]), Depression, Fractures, multiple, GERD (gastroesophageal reflux disease), Heart block, Hypercholesterolemia, Hyperglycemia, Hypertension, Hypothyroidism, Knee pain, Presence of permanent cardiac pacemaker, Shortness of breath dyspnea, Stroke ([place]), Syncope, and Ulcer of gastric fundus.   Surgical History:   Past Surgical History:  Procedure Laterality Date   CARDIAC CATHETERIZATION     CATARACT EXTRACTION W/PHACO Right 03/07/2021   Procedure: CATARACT EXTRACTION PHACO AND INTRAOCULAR LENS PLACEMENT (IOC) RIGHT 8.69 00:55.2;  Surgeon: Birder Robson, MD;  Location: [place];  Service: Ophthalmology;  Laterality: Right;   COLONOSCOPY N/A 07/06/2021   Procedure: COLONOSCOPY;  Surgeon: Annamaria Helling, DO;  Location: [facility] ENDOSCOPY;  Service: Gastroenterology;  Laterality: N/A;   CORONARY ANGIOPLASTY     stents x 3   heart stint  11/04/2014   [facility]   HERNIA REPAIR     inguinal   INSERT / REPLACE / REMOVE PACEMAKER     JOINT REPLACEMENT     right knee   KNEE ARTHROPLASTY Right 02/08/2016   Procedure: COMPUTER ASSISTED TOTAL KNEE ARTHROPLASTY;  Surgeon: Dereck Leep, MD;  Location: [facility];  Service: Orthopedics;  Laterality: Right;   KNEE ARTHROPLASTY Left 06/27/2016   Procedure: COMPUTER ASSISTED TOTAL KNEE ARTHROPLASTY;  Surgeon: Dereck Leep, MD;  Location: [facility];  Service: Orthopedics;  Laterality: Left;   LAPAROTOMY N/A 10/20/2022   Procedure: EXPLORATORY LAPAROTOMY; SIGMOID COLECTOMY WITH END COLOSTOMY CREATION;  Surgeon: Ronny Bacon, MD;  Location: [facility];  Service:  General;  Laterality: N/A;   PACEMAKER INSERTION  08/2007   PACEMAKER INSERTION N/A 11/16/2015   Procedure: INSERTION PACEMAKER/ PACEMAKER CHANGE OUT;  Surgeon: Isaias Cowman, MD;  Location: [facility];  Service: Cardiovascular;  Laterality: N/A;   PERCUTANEOUS PLACEMENT INTRAVASCULAR STENT CERVICAL CAROTID ARTERY     twice, right carotid artery     Social History:   reports that he has never smoked. He has never used smokeless tobacco. He reports that he does not drink alcohol and does not use drugs.   Family History:  His family history includes Cancer in his sister and sister; Colon polyps in his father; Diabetes in his sister; Heart disease in his father and mother; Thyroid disease in his father, sister, and sister. There is no history of Colon cancer or Prostate cancer.   Allergies Allergies  Allergen Reactions   Beta Adrenergic Blockers Other (See Comments)    Second Degree Heart Block   Lipitor [Atorvastatin] Other (See Comments)    Myalgia      Home Medications  Prior to Admission medications   Medication Sig Start Date End Date Taking? Authorizing Provider  amLODipine (NORVASC) 5 MG tablet TAKE 1 TABLET (5 MG TOTAL) BY MOUTH DAILY. 08/28/21  Yes Einar Pheasant, MD  Aspirin 81 MG EC tablet Take 81 mg by mouth daily.   Yes [provider]  citalopram (CELEXA) 20 MG tablet TAKE 1 TABLET BY MOUTH EVERY DAY 05/31/22  Yes Einar Pheasant, MD  clopidogrel (PLAVIX) 75 MG tablet TAKE 1 TABLET BY MOUTH EVERY DAY 07/12/22  Yes Einar Pheasant, MD  levothyroxine (SYNTHROID) 125 MCG tablet TAKE 1 TABLET BY MOUTH EVERY DAY BEFORE BREAKFAST 02/26/22  Yes Einar Pheasant, MD  lisinopril (ZESTRIL) 40 MG tablet TAKE 1 TABLET BY MOUTH EVERY DAY 03/19/22  Yes Einar Pheasant, MD  metoprolol tartrate (LOPRESSOR) 25 MG tablet TAKE 1 TABLET BY MOUTH TWICE A DAY 08/27/22  Yes Einar Pheasant, MD  pantoprazole (PROTONIX) 40 MG tablet TAKE 1 TABLET BY MOUTH EVERY DAY 05/04/22  Yes Einar Pheasant, MD  rosuvastatin (CRESTOR) 20 MG tablet TAKE 1 TABLET BY MOUTH EVERY DAY AT NIGHT 05/04/22  Yes Einar Pheasant, MD  acetaminophen (TYLENOL) 500 MG tablet Take 1,000 mg by mouth every 6 (six) hours as needed.    [provider]  fexofenadine (ALLEGRA) 180 MG tablet Take 180 mg by mouth daily. As needed    [provider]  LORazepam (ATIVAN) 1 MG tablet 1/2-1 TABLET AT BEDTIME AS NEEDED . 09/11/22   Einar Pheasant, MD  nitroGLYCERIN (NITROSTAT) 0.4 MG SL tablet Place 0.4 mg under the tongue every 5 (five) minutes as needed. If a third tablet is needed please call 911. Patient not taking: Reported on 09/25/2022    [provider]  triamcinolone (NASACORT) 55 MCG/ACT AERO nasal inhaler Place into the nose. Patient not taking: Reported on 09/27/2022 08/28/18   [provider]  triamcinolone cream (KENALOG) 0.1 % Apply 1 application topically 2 (two) times daily. Patient not taking: Reported on 10/03/2022 09/25/17   Einar Pheasant, MD     Critical care time: ***

## 2022-09-28 NOTE — Progress Notes (Addendum)
NAME:  Daniel Austin, MRN:  [id number], DOB:  02-11-1941, LOS: 2 ADMISSION DATE:  10/23/2022, CHIEF COMPLAINT:  Ischemic colitis   History of Present Illness:   82 yo M presenting to [facility] ED from home via EMS on 10/14/2022 with a chief complaint of sepsis alert. History provided by daughter and spouse bedside as patient is altered. Patient was in his normal state of health until he took a laxative dose 2 days ago then had uncontrollable diarrhea.  Spouse also described nausea with dry heaves and some mild emesis with poor p.o. intake. She denied fever, chest pain & shortness of breath.  They reported he fell and when the daughter arrived to assist her mother he appeared tremulous and diaphoretic. Family denies EtOH use/ NSAID use. EMS reported being called out due to nausea/vomiting and diarrhea, and the patient had a syncopal episode when first responders stood him up.  EMS reported hypotension in the field patient received a liter NS bolus and was given 4 mg of IV Zofran prior to arrival.   ED course: Upon arrival patient confused and severely hypotensive with tachypnea.  Sepsis protocol initiated.  Labs significant for hyponatremia (possible acute on chronic), hyperglycemia, AKI, AGMA, transaminitis, leukocytosis with lactic acidosis and elevated troponin.  Imaging suspicious for UTI, also revealing severe gastritis.  Post total 2.5 L IV fluid resuscitation between EMS and ED patient remained hypotensive requiring low-dose peripheral vasopressor support. ABG: 7.30/ 22/67/10.8   CXR 09/25/2022: Low lung volumes without active cardiopulmonary disease CT head without contrast 10/09/2022: No acute intracranial abnormality.  Chronic right frontal parietal and right parietoccipital infarcts.  Generalized cerebral atrophy and microvascular disease changes of the supratentorial brain. CT angio chest PE 09/29/2022: No evidence of pulmonary embolism or other acute intrathoracic process.  Marked severity coronary  artery calcification, small anterior pericardial effusion. CT abdomen and pelvis with contrast 10/15/2022: Findings consistent with marked severity gastritis and/or duodenitis.  Mild to moderate severity diffuse enteritis.  Large amount of stool within the mid to distal sigmoid colon and rectum consistent with constipation.  Sigmoid diverticulosis, 3.8 cm x 3.7 cm x 3.8 cm infrarenal AAA.  7 cm x 4.8 cm left inguinal hernia.  Mild diffuse urinary bladder wall thickening which may represent cystitis.    PCCM consulted for admission due to circulatory shock secondary to hypovolemia and suspected sepsis in the setting of gastritis and possible UTI requiring peripheral vasopressor support.  Pertinent  Medical History  Anemia Anxiety & depression CAD ICM status post PCI HFrEF (echo 2018 LVEF 45%) CVA Heart block status post PPM 12/08 Hypertension Hypercholesterolemia GERD Hypothyroidism Gastric ulcer status post-repair  Significant Hospital Events: Including procedures, antibiotic start and stop dates in addition to other pertinent events   09/27/2022: Admit to ICU with circulatory shock secondary to hypovolemia and sepsis POA in the setting of gastritis and possible UTI requiring peripheral vasopressor support. 1/3: Ischemic sigmoid colitis, likely complicated secondary to sliding left inguinal hernia. EXPLORATORY LAPAROTOMY; SIGMOID COLECTOMY WITH END COLOSTOMY  CREATION (Abdomen)  1/4: Extubated 1/5: Awake, comfortable in bed  Interim History / Subjective:  Patient awake today, reporting pain in the abdomen. He is more interactive.  Objective   Blood pressure (!) 104/50, pulse 83, temperature 99.6 F (37.6 C), temperature source Oral, resp. rate (!) 23, height 6' (1.829 m), weight 90.7 kg, SpO2 97 %.        Intake/Output Summary (Last 24 hours) at 09/28/2022 1339 Last data filed at 09/28/2022 1335 Gross per 24  hour  Intake 3932.89 ml  Output 2000 ml  Net 1932.89 ml   Filed Weights    10/20/2022 0117 09/27/22 0500 09/28/22 0500  Weight: 79.4 kg 90.3 kg 90.7 kg    Examination: Physical Exam Constitutional:      General: He is not in acute distress.    Appearance: He is ill-appearing.  HENT:     Head: Normocephalic.     Mouth/Throat:     Mouth: Mucous membranes are moist.  Eyes:     Extraocular Movements: Extraocular movements intact.  Cardiovascular:     Rate and Rhythm: Normal rate and regular rhythm.     Pulses: Normal pulses.     Heart sounds: Normal heart sounds.  Pulmonary:     Effort: Pulmonary effort is normal.     Breath sounds: Normal breath sounds.  Abdominal:     Palpations: Abdomen is soft.     Comments: Clean incision site, dark appearing intestinal mucosa extruding through the colostomy.   Musculoskeletal:        General: Normal range of motion.  Skin:    General: Skin is warm.  Neurological:     General: No focal deficit present.     Mental Status: He is alert.     Assessment & Plan:   Neurology #Toxic Metabolic Encephalopathy  Improved. Avoid sedating medications.  Cardiovascular #Distributive Shock #Demand Ischemia #History of CAD (DES mid LAD)  Developed distributive shock secondary to a combination of ischemic bowel, possible peritonitis, and fluid shifts following colonic resection. Patient had a TTE that shows low normal EF, with no notable hypokinesis. TTE with pericardial effusion previously noted on CT chest. Will consult cardiology. No signs of pulsus paradoxus or JV distention on physical exam.  -goal MAP of 65 mmHg -cardiology consult -continue nor-epinephrine and vasopressin  Pulmonary Extubated successfully, currently on room air with no active issues  Gastrointestinal #Ischemic Colitis  Patient is s/p Hartman's procedure secondary to ischemic colitis from a left inguinal hernia. His stoma is now matured with mucosal ischemic changes that surgery is closely following.  -increase trickle feeds -PPI for  prophylaxis  Renal #AKI #Metabolic acidosis #Hypokalemia  AKI on presentation has improved following the administration of sodium bicarbonate. Repeat creatinine down to 1.12, with associated hypokalemia. Will initiate repletion of electrolytes and continue to avoid nephrotoxic medications.  -bid BMP -avoid nephrotoxins  Endocrine #Hypothyroidism  Insulin sliding scale for glycemic control. Continue home levothyroxine.  Hem/Onc  Lovenox for DVT prophylaxis. Continue to monitor H/H and platelets.   ID #Ischemic Colitis  Broad spectrum antibiotics given concern of infection secondary to perforated bowel (egress of discolored fluid from the peritoneal cavity noted in the operative report).  -Continue Cefepime and Metronidazole  Best Practice (right click and "Reselect all SmartList Selections" daily)   Diet/type: tubefeeds DVT prophylaxis: LMWH GI prophylaxis: PPI Lines: Central line and yes and it is still needed Foley:  Yes, and it is still needed Code Status:  full code Last date of multidisciplinary goals of care discussion [09/28/2022]  Labs   CBC: Recent Labs  Lab 10/03/2022 0101 10/02/2022 1959 09/27/22 0437 09/28/22 0438  WBC 17.3* 15.0* 11.2* 8.3  NEUTROABS 14.9*  --   --   --   HGB 13.8 13.5 12.5* 11.2*  HCT 41.4 38.7* 35.6* 32.1*  MCV 90.0 86.8 87.3 86.8  PLT 301 228 184 139*    Basic Metabolic Panel: Recent Labs  Lab 10/15/2022 0101 10/07/2022 0313 09/30/2022 0846 10/06/2022 1212 10/15/2022 1959 09/27/22 3710  09/28/22 0438  NA 126*  --  131* 132* 131* 133* 132*  K 3.6  --  4.0  --  4.2 3.9 2.8*  CL 100  --  100  --  102 101 97*  CO2 10*  --  14*  --  15* 20* 25  GLUCOSE 214*  --  159*  --  158* 127* 139*  BUN 26*  --  37*  --  42* 38* 36*  CREATININE 1.62*  --  1.92*  --  1.59* 1.46* 1.12  CALCIUM 8.9  --  8.7*  --  7.5* 7.3* 6.9*  MG  --  2.3  --   --  2.6* 2.5* 2.4  PHOS  --  4.3  --   --  6.0* 5.2* 2.8   GFR: Estimated Creatinine Clearance: 55.8  mL/min (by C-G formula based on SCr of 1.12 mg/dL). Recent Labs  Lab 10/14/2022 0101 10/19/2022 0313 10/11/2022 0846 09/29/2022 1959 09/27/22 0437 09/28/22 0438  PROCALCITON <0.10  --   --   --  23.82 19.94  WBC 17.3*  --   --  15.0* 11.2* 8.3  LATICACIDVEN 5.3*   < > 7.2* 3.5* 3.1* 2.2*   < > = values in this interval not displayed.    Liver Function Tests: Recent Labs  Lab 10/15/2022 0101 09/24/2022 1959 09/27/22 0437  AST 42* 66* 68*  ALT '13 15 17  '$ ALKPHOS 70 46 38  BILITOT 1.2 1.1 1.2  PROT 6.6 5.4* 5.4*  ALBUMIN 3.9 3.2* 3.2*   Recent Labs  Lab 10/03/2022 0101  LIPASE 61*   No results for input(s): "AMMONIA" in the last 168 hours.  ABG    Component Value Date/Time   PHART 7.46 (H) 09/27/2022 0838   PCO2ART 30 (L) 09/27/2022 0838   PO2ART 101 09/27/2022 0838   HCO3 21.3 09/27/2022 0838   ACIDBASEDEF 1.5 09/27/2022 0838   O2SAT 97.6 09/27/2022 0838     Coagulation Profile: Recent Labs  Lab 10/22/2022 2111  INR 2.0*    Cardiac Enzymes: No results for input(s): "CKTOTAL", "CKMB", "CKMBINDEX", "TROPONINI" in the last 168 hours.  HbA1C: Hgb A1c MFr Bld  Date/Time Value Ref Range Status  08/29/2022 10:16 AM 6.1 4.6 - 6.5 % Final    Comment:    Glycemic Control Guidelines for People with Diabetes:Non Diabetic:  <6%Goal of Therapy: <7%Additional Action Suggested:  >8%   03/14/2021 11:51 AM 6.1 4.6 - 6.5 % Final    Comment:    Glycemic Control Guidelines for People with Diabetes:Non Diabetic:  <6%Goal of Therapy: <7%Additional Action Suggested:  >8%     CBG: Recent Labs  Lab 09/27/22 1931 09/27/22 2347 09/28/22 0440 09/28/22 0746 09/28/22 1134  GLUCAP 116* 130* 123* 147* 137*     Past Medical History:  He,  has a past medical history of Anemia, Anxiety, Arthritis, CAD (coronary artery disease), Cardiomyopathy ([place]), Carotid artery stenosis, CHF (congestive heart failure) ([place]), Depression, Fractures, multiple, GERD (gastroesophageal reflux disease), Heart  block, Hypercholesterolemia, Hyperglycemia, Hypertension, Hypothyroidism, Knee pain, Presence of permanent cardiac pacemaker, Shortness of breath dyspnea, Stroke ([place]), Syncope, and Ulcer of gastric fundus.   Surgical History:   Past Surgical History:  Procedure Laterality Date   CARDIAC CATHETERIZATION     CATARACT EXTRACTION W/PHACO Right 03/07/2021   Procedure: CATARACT EXTRACTION PHACO AND INTRAOCULAR LENS PLACEMENT (IOC) RIGHT 8.69 00:55.2;  Surgeon: Birder Robson, MD;  Location: [place];  Service: Ophthalmology;  Laterality: Right;   COLONOSCOPY N/A 07/06/2021  Procedure: COLONOSCOPY;  Surgeon: Annamaria Helling, DO;  Location: [facility] ENDOSCOPY;  Service: Gastroenterology;  Laterality: N/A;   CORONARY ANGIOPLASTY     stents x 3   heart stint  11/04/2014   [facility]   HERNIA REPAIR     inguinal   INSERT / REPLACE / REMOVE PACEMAKER     JOINT REPLACEMENT     right knee   KNEE ARTHROPLASTY Right 02/08/2016   Procedure: COMPUTER ASSISTED TOTAL KNEE ARTHROPLASTY;  Surgeon: Dereck Leep, MD;  Location: [facility];  Service: Orthopedics;  Laterality: Right;   KNEE ARTHROPLASTY Left 06/27/2016   Procedure: COMPUTER ASSISTED TOTAL KNEE ARTHROPLASTY;  Surgeon: Dereck Leep, MD;  Location: [facility];  Service: Orthopedics;  Laterality: Left;   LAPAROTOMY N/A 10/23/2022   Procedure: EXPLORATORY LAPAROTOMY; SIGMOID COLECTOMY WITH END COLOSTOMY CREATION;  Surgeon: Ronny Bacon, MD;  Location: [facility];  Service: General;  Laterality: N/A;   PACEMAKER INSERTION  08/2007   PACEMAKER INSERTION N/A 11/16/2015   Procedure: INSERTION PACEMAKER/ PACEMAKER CHANGE OUT;  Surgeon: Isaias Cowman, MD;  Location: [facility];  Service: Cardiovascular;  Laterality: N/A;   PERCUTANEOUS PLACEMENT INTRAVASCULAR STENT CERVICAL CAROTID ARTERY     twice, right carotid artery     Social History:   reports that he has never smoked. He has never used smokeless tobacco. He reports that he  does not drink alcohol and does not use drugs.   Family History:  His family history includes Cancer in his sister and sister; Colon polyps in his father; Diabetes in his sister; Heart disease in his father and mother; Thyroid disease in his father, sister, and sister. There is no history of Colon cancer or Prostate cancer.   Allergies Allergies  Allergen Reactions   Beta Adrenergic Blockers Other (See Comments)    Second Degree Heart Block   Lipitor [Atorvastatin] Other (See Comments)    Myalgia      Home Medications  Prior to Admission medications   Medication Sig Start Date End Date Taking? Authorizing Provider  amLODipine (NORVASC) 5 MG tablet TAKE 1 TABLET (5 MG TOTAL) BY MOUTH DAILY. 08/28/21  Yes Einar Pheasant, MD  Aspirin 81 MG EC tablet Take 81 mg by mouth daily.   Yes [provider]  citalopram (CELEXA) 20 MG tablet TAKE 1 TABLET BY MOUTH EVERY DAY 05/31/22  Yes Einar Pheasant, MD  clopidogrel (PLAVIX) 75 MG tablet TAKE 1 TABLET BY MOUTH EVERY DAY 07/12/22  Yes Einar Pheasant, MD  levothyroxine (SYNTHROID) 125 MCG tablet TAKE 1 TABLET BY MOUTH EVERY DAY BEFORE BREAKFAST 02/26/22  Yes Einar Pheasant, MD  lisinopril (ZESTRIL) 40 MG tablet TAKE 1 TABLET BY MOUTH EVERY DAY 03/19/22  Yes Einar Pheasant, MD  metoprolol tartrate (LOPRESSOR) 25 MG tablet TAKE 1 TABLET BY MOUTH TWICE A DAY 08/27/22  Yes Einar Pheasant, MD  pantoprazole (PROTONIX) 40 MG tablet TAKE 1 TABLET BY MOUTH EVERY DAY 05/04/22  Yes Einar Pheasant, MD  rosuvastatin (CRESTOR) 20 MG tablet TAKE 1 TABLET BY MOUTH EVERY DAY AT NIGHT 05/04/22  Yes Einar Pheasant, MD  acetaminophen (TYLENOL) 500 MG tablet Take 1,000 mg by mouth every 6 (six) hours as needed.    [provider]  fexofenadine (ALLEGRA) 180 MG tablet Take 180 mg by mouth daily. As needed    [provider]  LORazepam (ATIVAN) 1 MG tablet 1/2-1 TABLET AT BEDTIME AS NEEDED . 09/11/22   Einar Pheasant, MD  nitroGLYCERIN  (NITROSTAT) 0.4 MG SL tablet Place 0.4  mg under the tongue every 5 (five) minutes as needed. If a third tablet is needed please call 911. Patient not taking: Reported on 10/14/2022    [provider]  triamcinolone (NASACORT) 55 MCG/ACT AERO nasal inhaler Place into the nose. Patient not taking: Reported on 10/04/2022 08/28/18   [provider]  triamcinolone cream (KENALOG) 0.1 % Apply 1 application topically 2 (two) times daily. Patient not taking: Reported on 09/28/2022 09/25/17   Einar Pheasant, MD     Critical care time: 31 minutes    Armando Reichert, MD [facility] 09/28/2022 4:24 PM

## 2022-09-28 NOTE — Progress Notes (Signed)
[facility] ADULT ICU REPLACEMENT PROTOCOL   The patient does apply for the [facility] Adult ICU Electrolyte Replacment Protocol based on the criteria listed below:   1.Exclusion criteria: TCTS, ECMO, Dialysis, and Myasthenia Gravis patients 2. Is GFR >/= 30 ml/min? Yes.    Patient's GFR today is >60 3. Is SCr </= 2? Yes.   Patient's SCr is 1.12 mg/dL 4. Did SCr increase >/= 0.5 in 24 hours? No. 5.Pt's weight >40kg  Yes.   6. Abnormal electrolyte(s): k 2.8  7. Electrolytes replaced per protocol 8.  Call MD STAT for K+ </= 2.5, Phos </= 1, or Mag </= 1 Physician:    Ronda Fairly A 09/28/2022 5:28 AM

## 2022-09-28 NOTE — Consult Note (Signed)
PHARMACY CONSULT NOTE - ELECTROLYTES  Pharmacy Consult for Electrolyte Monitoring and Replacement   Recent Labs: Potassium (mmol/L)  Date Value  09/28/2022 2.8 (L)   Magnesium (mg/dL)  Date Value  09/28/2022 2.4   Calcium (mg/dL)  Date Value  09/28/2022 6.9 (L)   Albumin (g/dL)  Date Value  09/27/2022 3.2 (L)   Phosphorus (mg/dL)  Date Value  09/28/2022 2.8   Sodium (mmol/L)  Date Value  09/28/2022 132 (L)   Corrected Ca: 9.0 mg/dL  Assessment  Daniel Austin is a 82 y.o. male presenting with sepsis. PMH significant for anemia, anxiety, depression, CAD, ICM s/p PCI, HFrEF (2018 EF 45%), CVA, HTN, HLD, GERD, hypothyroidism. Pharmacy has been consulted to monitor and replace electrolytes.  Diet: NPO MIVF: Bicarb @ 100 mL/hr  Goal of Therapy: Electrolytes WNL  Plan:  Sodium: 133 >> 132, stabilizing, continue to monitor Potassium: 3.9 >> 2.8, give KCl 10 mEq IV Q1H x5, KCl 40 mEq per tube x1 Magnesium: 2.6 >> 2.5, improving, continue to monitor Phosphorus: 5.2 >> 2.8, improving, continue to monitor Check BMP, Mg, Phos with AM labs  Thank you for allowing pharmacy to be a part of this patient's care.  Gretel Acre, PharmD PGY1 Pharmacy Resident 09/28/2022 9:00 AM

## 2022-09-28 NOTE — Telephone Encounter (Signed)
Pt in hospital.  Please cancel lab appt and notify pts wife.  Please confirm she is doing ok.  Can schedule labs when released and know more what will be needed.

## 2022-09-28 NOTE — Progress Notes (Signed)
Nutrition Follow Up Note   DOCUMENTATION CODES:   Non-severe (moderate) malnutrition in context of social or environmental circumstances  INTERVENTION:   Vital 1.5'@65ml'$ /hr- advance by 81m/hr q 8 hours until goal rate is reached.   ProSource TF 20- Give 641mdaily via tube, each supplement provides 80kcal and 20g of protein.   Free water flushes 307m4 hours to maintain tube patency   Regimen provides 2420kcal/day, 125g/day protein and 1372m29my of free water  Pt at high refeed risk; recommend monitor potassium, magnesium and phosphorus labs daily until stable  Daily weights   NUTRITION DIAGNOSIS:   Moderate Malnutrition related to social / environmental circumstances (advanced age) as evidenced by mild fat depletion, moderate muscle depletion.  GOAL:   Patient will meet greater than or equal to 90% of their needs -progressing   MONITOR:   Vent status, Labs, Weight trends, TF tolerance, Skin, I & O's  ASSESSMENT:   81 y18 male with h/o cardiomyopathy, hypothyroidism, CAD, HTN, CHF, HLD, MI, pacemaker, PUD, GERD, depression and stroke who is admitted with septic shock secondary to ischemic sigmoid colitis from sliding left inguinal hernia now s/p exploratory laparotomy with sigmoid resection with end colostomy 1/3.  Pt extubated yesterday. NGT placed. Pt is tolerating tube feeds well at trickle rate; will begin increasing tube feeds towards goal rate today. Pt is refeeding; electrolytes being monitored and supplemented. Per chart, pt is up ~19lbs from his UBW; pt +9.2L on his I & Os.     Medications reviewed and include: celexa, lovenox, insulin, synthroid, protonix, cefepime, metronidazole, levophed, vasopressin   Labs reviewed: Na 132(L), K 2.8(L), BUN 36(H), P 2.8 wnl, Mg 2.4 wnl Cbgs- 137, 147, 123 x 24 hrs   Diet Order:    Diet Order             Diet NPO time specified  Diet effective now                  EDUCATION NEEDS:   No education needs have been  identified at this time  Skin:  Skin Assessment: Reviewed RN Assessment (incision abdomen)  Last BM:  1/5- 170ml52m ostomy  Height:   Ht Readings from Last 1 Encounters:  09/29/2022 6' (1.829 m)    Weight:   Wt Readings from Last 1 Encounters:  09/28/22 90.7 kg    Ideal Body Weight:  80.9 kg  BMI:  Body mass index is 27.12 kg/m.  Estimated Nutritional Needs:   Kcal:  2200-2500kcal/day  Protein:  110-125g/day  Fluid:  2.0-2.3L/day  CaseyKoleen DistanceRD, LDN Please refer to [facility] and/or RD on-call/weekend/after hours pager

## 2022-09-28 NOTE — Telephone Encounter (Signed)
Appt cancelled LM for Darlene for status of pt and pt wife

## 2022-09-28 NOTE — Consult Note (Signed)
Daniel Austin is a 82 y.o. male  [id number]  Primary Cardiologist: Neoma Laming Reason for Consultation: pericardial effusion  HPI: Asked to see this patient as had mild to moderate pericardial effusion with hypotension. Patient denies chest pain or SOB, or dizziness.   Review of Systems: No chest pain   Past Medical History:  Diagnosis Date   Anemia    Anxiety    Arthritis    CAD (coronary artery disease)    Cardiomyopathy ([place])    Carotid artery stenosis    high-grade symptomatic right sided s/p stent twice   CHF (congestive heart failure) (HCC)    chronic movement fluid and history of CHF he noticed in the    Depression    Fractures, multiple    Metal rod in left foot   GERD (gastroesophageal reflux disease)    Heart block    Hypercholesterolemia    Hyperglycemia    Hypertension    Hypothyroidism    Knee pain    Presence of permanent cardiac pacemaker    11/16/2015 Medtronic ADR01,   (2008 St Jude)   Shortness of breath dyspnea    Stroke [facility])    clot in neck broke off left side weakness 2008  resolved   Syncope    second degree heart block s/p St Jude's pacemaker placement 12/08 with his history is limited with any   Ulcer of gastric fundus     Medications Prior to Admission  Medication Sig Dispense Refill   amLODipine (NORVASC) 5 MG tablet TAKE 1 TABLET (5 MG TOTAL) BY MOUTH DAILY. 90 tablet 3   Aspirin 81 MG EC tablet Take 81 mg by mouth daily.     citalopram (CELEXA) 20 MG tablet TAKE 1 TABLET BY MOUTH EVERY DAY 90 tablet 2   clopidogrel (PLAVIX) 75 MG tablet TAKE 1 TABLET BY MOUTH EVERY DAY 90 tablet 1   levothyroxine (SYNTHROID) 125 MCG tablet TAKE 1 TABLET BY MOUTH EVERY DAY BEFORE BREAKFAST 90 tablet 1   lisinopril (ZESTRIL) 40 MG tablet TAKE 1 TABLET BY MOUTH EVERY DAY 90 tablet 1   metoprolol tartrate (LOPRESSOR) 25 MG tablet TAKE 1 TABLET BY MOUTH TWICE A DAY 60 tablet 1   pantoprazole (PROTONIX) 40 MG tablet TAKE 1 TABLET BY MOUTH EVERY DAY 90  tablet 2   rosuvastatin (CRESTOR) 20 MG tablet TAKE 1 TABLET BY MOUTH EVERY DAY AT NIGHT 90 tablet 2   acetaminophen (TYLENOL) 500 MG tablet Take 1,000 mg by mouth every 6 (six) hours as needed.     fexofenadine (ALLEGRA) 180 MG tablet Take 180 mg by mouth daily. As needed     LORazepam (ATIVAN) 1 MG tablet 1/2-1 TABLET AT BEDTIME AS NEEDED . 30 tablet 0   nitroGLYCERIN (NITROSTAT) 0.4 MG SL tablet Place 0.4 mg under the tongue every 5 (five) minutes as needed. If a third tablet is needed please call 911. (Patient not taking: Reported on 10/23/2022)     triamcinolone (NASACORT) 55 MCG/ACT AERO nasal inhaler Place into the nose. (Patient not taking: Reported on 09/24/2022)     triamcinolone cream (KENALOG) 0.1 % Apply 1 application topically 2 (two) times daily. (Patient not taking: Reported on 10/20/2022) 30 g 0      Chlorhexidine Gluconate Cloth  6 each Topical Daily   citalopram  20 mg Per Tube Q2000   enoxaparin (LOVENOX) injection  40 mg Subcutaneous Q24H   [START ON 09/29/2022] feeding supplement (PROSource TF20)  60 mL Per Tube Daily  free water  30 mL Per Tube Q4H   insulin aspart  0-15 Units Subcutaneous Q4H   levothyroxine  100 mcg Intravenous Daily   mouth rinse  15 mL Mouth Rinse 4 times per day   [START ON 09/29/2022] pantoprazole (PROTONIX) IV  40 mg Intravenous Q24H    Infusions:  sodium chloride 10 mL/hr at 09/28/22 1722   acetaminophen Stopped (09/28/22 1254)   ceFEPime (MAXIPIME) IV Stopped (09/28/22 1523)   feeding supplement (VITAL 1.5 CAL) 30 mL/hr at 09/28/22 1722   lactated ringers 75 mL/hr at 09/28/22 1722   metronidazole Stopped (09/28/22 1056)   norepinephrine (LEVOPHED) Adult infusion 17 mcg/min (09/28/22 1722)   vasopressin Stopped (09/28/22 1221)    Allergies  Allergen Reactions   Beta Adrenergic Blockers Other (See Comments)    Second Degree Heart Block   Lipitor [Atorvastatin] Other (See Comments)    Myalgia     Social History   Socioeconomic History    Marital status: Married    Spouse name: Not on file   Number of children: 1   Years of education: Not on file   Highest education level: Not on file  Occupational History   Not on file  Tobacco Use   Smoking status: Never   Smokeless tobacco: Never  Vaping Use   Vaping Use: Never used  Substance and Sexual Activity   Alcohol use: No    Alcohol/week: 0.0 standard drinks of alcohol   Drug use: No   Sexual activity: Not Currently  Other Topics Concern   Not on file  Social History Narrative   Not on file   Social Determinants of Health   Financial Resource Strain: Low Risk  (09/18/2022)   Overall Financial Resource Strain (CARDIA)    Difficulty of Paying Living Expenses: Not hard at all  Food Insecurity: No Food Insecurity (09/18/2022)   Hunger Vital Sign    Worried About Running Out of Food in the Last Year: Never true    Ran Out of Food in the Last Year: Never true  Transportation Needs: No Transportation Needs (09/18/2022)   PRAPARE - Hydrologist (Medical): No    Lack of Transportation (Non-Medical): No  Physical Activity: Insufficiently Active (09/18/2022)   Exercise Vital Sign    Days of Exercise per Week: 5 days    Minutes of Exercise per Session: 10 min  Stress: No Stress Concern Present (09/18/2022)   [place]    Feeling of Stress : Not at all  Social Connections: Unknown (09/18/2022)   Social Connection and Isolation Panel [NHANES]    Frequency of Communication with Friends and Family: More than three times a week    Frequency of Social Gatherings with Friends and Family: More than three times a week    Attends Religious Services: Not on file    Active Member of Clubs or Organizations: Not on file    Attends Archivist Meetings: Not on file    Marital Status: Married  Intimate Partner Violence: Not At Risk (09/18/2022)   Humiliation, Afraid, Rape, and  Kick questionnaire    Fear of Current or Ex-Partner: No    Emotionally Abused: No    Physically Abused: No    Sexually Abused: No    Family History  Problem Relation Age of Onset   Heart disease Mother        CHF   Heart disease Father  CHF   Thyroid disease Father        hypothyroidism   Colon polyps Father    Diabetes Sister        diet controlled   Thyroid disease Sister    Cancer Sister        breast    Thyroid disease Sister    Cancer Sister        breast   Colon cancer Neg Hx    Prostate cancer Neg Hx     PHYSICAL EXAM: Vitals:   09/28/22 1730 09/28/22 1733  BP: (!) 118/54   Pulse: (!) 105 (!) 36  Resp: (!) 24 (!) 23  Temp:  98.8 F (37.1 C)  SpO2: 97% 97%     Intake/Output Summary (Last 24 hours) at 09/28/2022 1832 Last data filed at 09/28/2022 1737 Gross per 24 hour  Intake 3869.83 ml  Output 1780 ml  Net 2089.83 ml    General:  Well appearing. No respiratory difficulty HEENT: normal Neck: supple. no JVD. Carotids 2+ bilat; no bruits. No lymphadenopathy or thryomegaly appreciated. Cor: PMI nondisplaced. Regular rate & rhythm. No rubs, gallops or murmurs. Lungs: clear Abdomen: soft, nontender, nondistended. No hepatosplenomegaly. No bruits or masses. Good bowel sounds. Extremities: no cyanosis, clubbing, rash, edema Neuro: alert & oriented x 3, cranial nerves grossly intact. moves all 4 extremities w/o difficulty. Affect pleasant.  ECG: AV sequential paced rhythm  Results for orders placed or performed during the hospital encounter of 10/02/2022 (from the past 24 hour(s))  Glucose, capillary     Status: Abnormal   Collection Time: 09/27/22  7:31 PM  Result Value Ref Range   Glucose-Capillary 116 (H) 70 - 99 mg/dL  Glucose, capillary     Status: Abnormal   Collection Time: 09/27/22 11:47 PM  Result Value Ref Range   Glucose-Capillary 130 (H) 70 - 99 mg/dL  Procalcitonin     Status: None   Collection Time: 09/28/22  4:38 AM  Result Value Ref  Range   Procalcitonin 19.94 ng/mL  Basic metabolic panel     Status: Abnormal   Collection Time: 09/28/22  4:38 AM  Result Value Ref Range   Sodium 132 (L) 135 - 145 mmol/L   Potassium 2.8 (L) 3.5 - 5.1 mmol/L   Chloride 97 (L) 98 - 111 mmol/L   CO2 25 22 - 32 mmol/L   Glucose, Bld 139 (H) 70 - 99 mg/dL   BUN 36 (H) 8 - 23 mg/dL   Creatinine, Ser 1.12 0.61 - 1.24 mg/dL   Calcium 6.9 (L) 8.9 - 10.3 mg/dL   GFR, Estimated >60 >60 mL/min   Anion gap 10 5 - 15  Phosphorus     Status: None   Collection Time: 09/28/22  4:38 AM  Result Value Ref Range   Phosphorus 2.8 2.5 - 4.6 mg/dL  Magnesium     Status: None   Collection Time: 09/28/22  4:38 AM  Result Value Ref Range   Magnesium 2.4 1.7 - 2.4 mg/dL  CBC     Status: Abnormal   Collection Time: 09/28/22  4:38 AM  Result Value Ref Range   WBC 8.3 4.0 - 10.5 K/uL   RBC 3.70 (L) 4.22 - 5.81 MIL/uL   Hemoglobin 11.2 (L) 13.0 - 17.0 g/dL   HCT 32.1 (L) 39.0 - 52.0 %   MCV 86.8 80.0 - 100.0 fL   MCH 30.3 26.0 - 34.0 pg   MCHC 34.9 30.0 - 36.0 g/dL   RDW 13.7 11.5 -  15.5 %   Platelets 139 (L) 150 - 400 K/uL   nRBC 0.0 0.0 - 0.2 %  Lactic acid, plasma     Status: Abnormal   Collection Time: 09/28/22  4:38 AM  Result Value Ref Range   Lactic Acid, Venous 2.2 (HH) 0.5 - 1.9 mmol/L  Glucose, capillary     Status: Abnormal   Collection Time: 09/28/22  4:40 AM  Result Value Ref Range   Glucose-Capillary 123 (H) 70 - 99 mg/dL  Glucose, capillary     Status: Abnormal   Collection Time: 09/28/22  7:46 AM  Result Value Ref Range   Glucose-Capillary 147 (H) 70 - 99 mg/dL  Glucose, capillary     Status: Abnormal   Collection Time: 09/28/22 11:34 AM  Result Value Ref Range   Glucose-Capillary 137 (H) 70 - 99 mg/dL  Lactic acid, plasma     Status: Abnormal   Collection Time: 09/28/22  1:25 PM  Result Value Ref Range   Lactic Acid, Venous 2.1 (HH) 0.5 - 1.9 mmol/L  Glucose, capillary     Status: Abnormal   Collection Time: 09/28/22   4:06 PM  Result Value Ref Range   Glucose-Capillary 140 (H) 70 - 99 mg/dL  Potassium     Status: Abnormal   Collection Time: 09/28/22  4:17 PM  Result Value Ref Range   Potassium 3.3 (L) 3.5 - 5.1 mmol/L   ECHOCARDIOGRAM COMPLETE  Result Date: 09/27/2022    ECHOCARDIOGRAM REPORT   Patient Name:   NOTNAMED SCHOLZ Date of Exam: 09/27/2022 Medical Rec #:  [id number]        Height:       72.0 in Accession #:    [id number]       Weight:       199.1 lb Date of Birth:  1941-05-12        BSA:          2.126 m Patient Age:    52 years         BP:           106/69 mmHg Patient Gender: M                HR:           106 bpm. Exam Location:  [facility] Procedure: 2D Echo, Cardiac Doppler and Color Doppler Indications:     Pericardial effusion  History:         Patient has no prior history of Echocardiogram examinations.                  Cardiomyopathy and CHF, CAD, Pacemaker, Arrythmias:Tachycardia,                  Signs/Symptoms:Syncope; Risk Factors:Hypertension and                  Dyslipidemia.  Sonographer:     Wenda Low Referring Phys:  [id number] BRITTON L [place] Diagnosing Phys: Yolonda Kida MD  Sonographer Comments: Technically difficult study due to poor echo windows, no subcostal window, suboptimal parasternal window and suboptimal apical window. Image acquisition challenging due to respiratory motion. IMPRESSIONS  1. Left ventricular ejection fraction, by estimation, is 45 to 50%. The left ventricle has mildly decreased function. The left ventricle has no regional wall motion abnormalities. Left ventricular diastolic parameters are consistent with Grade I diastolic dysfunction (impaired relaxation).  2. Right ventricular systolic function is normal. The right ventricular size is normal. There is normal pulmonary  artery systolic pressure.  3. Moderate pericardial effusion. The pericardial effusion is anterior to the right ventricle, localized near the right ventricle and surrounding the apex.  4. The  mitral valve is normal in structure. Trivial mitral valve regurgitation.  5. The aortic valve is normal in structure. Aortic valve regurgitation is not visualized. FINDINGS  Left Ventricle: Left ventricular ejection fraction, by estimation, is 45 to 50%. The left ventricle has mildly decreased function. The left ventricle has no regional wall motion abnormalities. The left ventricular internal cavity size was normal in size. There is no left ventricular hypertrophy. Left ventricular diastolic parameters are consistent with Grade I diastolic dysfunction (impaired relaxation). Right Ventricle: The right ventricular size is normal. No increase in right ventricular wall thickness. Right ventricular systolic function is normal. There is normal pulmonary artery systolic pressure. The tricuspid regurgitant velocity is 2.18 m/s, and  with an assumed right atrial pressure of 8 mmHg, the estimated right ventricular systolic pressure is 96.2 mmHg. Left Atrium: Left atrial size was normal in size. Right Atrium: Right atrial size was normal in size. Pericardium: A moderately sized pericardial effusion is present. The pericardial effusion is anterior to the right ventricle, localized near the right ventricle and surrounding the apex. The pericardial effusion appears to contain fibrous material and mixed echogenic material. Mitral Valve: The mitral valve is normal in structure. Trivial mitral valve regurgitation. MV peak gradient, 3.9 mmHg. The mean mitral valve gradient is 1.0 mmHg. Tricuspid Valve: The tricuspid valve is normal in structure. Tricuspid valve regurgitation is mild. Aortic Valve: The aortic valve is normal in structure. Aortic valve regurgitation is not visualized. Aortic valve mean gradient measures 5.0 mmHg. Aortic valve peak gradient measures 10.9 mmHg. Aortic valve area, by VTI measures 2.60 cm. Pulmonic Valve: The pulmonic valve was normal in structure. Pulmonic valve regurgitation is not visualized. Aorta:  The ascending aorta was not well visualized. IAS/Shunts: No atrial level shunt detected by color flow Doppler. Additional Comments: A device lead is visualized.  LEFT VENTRICLE PLAX 2D LVIDd:         3.70 cm   Diastology LVIDs:         2.80 cm   LV e' medial:    7.40 cm/s LV PW:         1.50 cm   LV E/e' medial:  8.2 LV IVS:        1.10 cm   LV e' lateral:   6.42 cm/s LVOT diam:     2.15 cm   LV E/e' lateral: 9.5 LV SV:         73 LV SV Index:   34 LVOT Area:     3.63 cm  RIGHT VENTRICLE RV Basal diam:  3.30 cm RV Mid diam:    2.70 cm LEFT ATRIUM           Index LA diam:      3.60 cm 1.69 cm/m LA Vol (A4C): 39.5 ml 18.58 ml/m  AORTIC VALVE AV Area (Vmax):    2.16 cm AV Area (Vmean):   2.32 cm AV Area (VTI):     2.60 cm AV Vmax:           165.00 cm/s AV Vmean:          102.000 cm/s AV VTI:            0.279 m AV Peak Grad:      10.9 mmHg AV Mean Grad:      5.0 mmHg LVOT Vmax:  98.20 cm/s LVOT Vmean:        65.300 cm/s LVOT VTI:          0.200 m LVOT/AV VTI ratio: 0.72 MITRAL VALVE                TRICUSPID VALVE MV Area (PHT): 1.78 cm     TR Peak grad:   19.0 mmHg MV Area VTI:   3.10 cm     TR Vmax:        218.00 cm/s MV Peak grad:  3.9 mmHg MV Mean grad:  1.0 mmHg     SHUNTS MV Vmax:       0.98 m/s     Systemic VTI:  0.20 m MV Vmean:      49.8 cm/s    Systemic Diam: 2.15 cm MV Decel Time: 427 msec MV E velocity: 60.80 cm/s MV A velocity: 108.00 cm/s MV E/A ratio:  0.56 Dwayne Prince Rome MD Electronically signed by Yolonda Kida MD Signature Date/Time: 09/27/2022/10:01:57 PM    Final    DG Abd 1 View  Result Date: 09/27/2022 CLINICAL DATA:  Feeding tube placement. Recent abdominal surgery yesterday. EXAM: ABDOMEN - 1 VIEW COMPARISON:  Abdominal x-ray from yesterday. FINDINGS: Feeding tube tip in the stomach. Postoperative pneumoperitoneum under the diaphragm. Clear lung bases. IMPRESSION: 1. Feeding tube tip in the stomach. Electronically Signed   By: Titus Dubin M.D.   On: 09/27/2022 13:44      ASSESSMENT AND PLAN: Pericardial effusion with borderline blood pressure with blood pressure being 105 right now.  Echocardiogram showed moderate anterior pericardial effusion without any hemodynamic compromise with no evidence of tamponade.  I do not think this is the cause of patient's hypotension.  However will follow the patient closely with you at this time patient is not having any dizziness chest pain or shortness of breath.  Thank you very much for the referral.  Jeff Mccallum A

## 2022-09-28 NOTE — Progress Notes (Signed)
[facility] Day(s): 2.   Post op day(s): 2 Days Post-Op.   Interval History: Patient seen and examined, wife and son-in-law at bedside, no acute events reported overnight. Patient remains extubated.  Alert and responsive, appropriate.     Vital signs in last 24 hours: [min-max] current  Temp:  [99.5 F (37.5 C)-99.9 F (37.7 C)] 99.6 F (37.6 C) (01/05 0931) Pulse Rate:  [25-120] 101 (01/05 1445) Resp:  [0-30] 22 (01/05 1445) BP: (92-151)/(43-93) 133/53 (01/05 1445) SpO2:  [96 %-100 %] 97 % (01/05 1445) Arterial Line BP: (81-162)/(38-82) 109/47 (01/05 1445) Weight:  [90.7 kg] 90.7 kg (01/05 0500)     Height: 6' (182.9 cm) Weight: 90.7 kg BMI (Calculated): 27.11   Intake/Output last 2 shifts:  01/04 0701 - 01/05 0700 In: 3699.9 [I.V.:2888; NG/GT:273; IV Piggyback:538.9] Out: 1925 [Urine:1755; Stool:170]   Physical Exam:  Constitutional: Sedated, intubated, no distress  Respiratory: breathing on ventilator. Cardiovascular: Well-perfused, warm extremities. Gastrointestinal: Distal-most stapled end of colon excised with patchy gangrene, matured colostomy to see the discolored/mucosal ischemic changes, serosa prior to eversion is still pink/viable.  Had bleeding with division of colon.  Stoma matured with 3-0 Vicryl.  Otherwise soft, non-tender, and non-distended Integumentary: Extremities warm, no remarkable edema.  Honeycomb dressing intact over midline incision.  Labs:     Latest Ref Rng & Units 09/28/2022    4:38 AM 09/27/2022    4:37 AM 10/24/2022    7:59 PM  CBC  WBC 4.0 - 10.5 K/uL 8.3  11.2  15.0   Hemoglobin 13.0 - 17.0 g/dL 11.2  12.5  13.5   Hematocrit 39.0 - 52.0 % 32.1  35.6  38.7   Platelets 150 - 400 K/uL 139  184  228       Latest Ref Rng & Units 09/28/2022    4:38 AM 09/27/2022    4:37 AM 10/17/2022    7:59 PM  CMP  Glucose 70 - 99 mg/dL 139  127  158   BUN 8 - 23 mg/dL 36  38  42   Creatinine 0.61 - 1.24 mg/dL  1.12  1.46  1.59   Sodium 135 - 145 mmol/L 132  133  131   Potassium 3.5 - 5.1 mmol/L 2.8  3.9  4.2   Chloride 98 - 111 mmol/L 97  101  102   CO2 22 - 32 mmol/L '25  20  15   '$ Calcium 8.9 - 10.3 mg/dL 6.9  7.3  7.5   Total Protein 6.5 - 8.1 g/dL  5.4  5.4   Total Bilirubin 0.3 - 1.2 mg/dL  1.2  1.1   Alkaline Phos 38 - 126 U/L  38  46   AST 15 - 41 U/L  68  66   ALT 0 - 44 U/L  17  15    Lactate 2.2 this AM.    Imaging studies: No new pertinent imaging studies   Assessment/Plan:  82 y.o. male with  2 Days Post-Op s/p Hartman's procedure for ischemic changes likely secondary to left inguinal hernia, complicated by pertinent comorbidities including:  Patient Active Problem List   Diagnosis Date Noted   Malnutrition of moderate degree 09/28/2022   Shock circulatory ([place]) 09/25/2022   Gangrenous ischemic colitis ([place]) 10/05/2022   Incarcerated left inguinal hernia 10/08/2022   Sepsis ([place]) 09/30/2022   Lactic acidosis 09/28/2022   Memory change 08/27/2022   History of right common carotid artery stent placement 06/12/2021  Pacemaker 06/12/2021   Weakness 04/30/2020   Hyponatremia 05/10/2019   Dog bite 01/21/2019   Stress 05/05/2017   Weakness of left arm 11/09/2016   H/O total knee replacement 03/24/2016   S/P total knee arthroplasty 47/65/4650   Chronic systolic CHF (congestive heart failure) ([place]) 12/13/2015   HLD (hyperlipidemia) 12/13/2015   BP (high blood pressure) 12/13/2015   Cardiomyopathy, ischemic 12/13/2015   MI (mitral incompetence) 12/13/2015   Billowing mitral valve 12/13/2015   History of ventricular tachycardia 12/13/2015   Carotid stenosis 12/13/2015   Syncope and collapse 12/13/2015   Disease of thyroid gland 12/13/2015   Health care maintenance 09/04/2015   Encounter for completion of form with patient 01/12/2015   Left arm numbness 09/30/2014   Ischemia of upper extremity 09/30/2014   Pins and needles sensation 09/30/2014   Unspecified visual  disturbance 09/30/2014   Paresthesia of arm 09/30/2014   Primary osteoarthritis of both knees 09/29/2014   History of cardiac catheterization 05/12/2014   Other specified postprocedural states 05/12/2014   Change in vision 01/09/2014   Knee pain, bilateral 01/09/2014   Arthralgia of lower leg 01/09/2014   Left knee pain 09/13/2013   Hyperglycemia 11/05/2012   Carotid artery stenosis 11/05/2012   Carotid artery narrowing 11/05/2012   Carotid artery obstruction 11/05/2012   Abnormal blood sugar 11/05/2012   Hypothyroidism 10/31/2012   Pure hypercholesterolemia 10/31/2012   Dysphagia, unspecified(787.20) 10/31/2012   Coronary atherosclerosis of native coronary artery 10/31/2012   Anemia 10/31/2012   Essential hypertension, benign 10/31/2012   Absolute anemia 10/31/2012   Benign essential HTN 10/31/2012   Adult hypothyroidism 10/31/2012    -May increase trickle feeds.  -Stoma now matured, with expected mucosal ischemic change.   -Appreciate CCM's care.  -Continue IV antibiotics, PPI prophylaxis, DVT prophylaxis.  I do not anticipate return to the OR.  All of the above findings and recommendations were discussed with the patient's family, and all of their questions were answered to apparent satisfaction.  -- Ronny Bacon, M.D., [facility] 09/28/2022

## 2022-09-29 ENCOUNTER — Inpatient Hospital Stay
Admit: 2022-09-29 | Discharge: 2022-09-29 | Disposition: A | Discharge status: Home / Self Care | Payer: Medicare Other | Attending: Student in an Organized Health Care Education/Training Program | Admitting: Student in an Organized Health Care Education/Training Program

## 2022-09-29 ENCOUNTER — Inpatient Hospital Stay: Payer: Medicare Other

## 2022-09-29 DIAGNOSIS — K55049 Acute infarction of large intestine, extent unspecified: Secondary | ICD-10-CM | POA: Diagnosis not present

## 2022-09-29 DIAGNOSIS — A419 Sepsis, unspecified organism: Secondary | ICD-10-CM | POA: Diagnosis not present

## 2022-09-29 DIAGNOSIS — R579 Shock, unspecified: Secondary | ICD-10-CM | POA: Diagnosis not present

## 2022-09-29 DIAGNOSIS — K4030 Unilateral inguinal hernia, with obstruction, without gangrene, not specified as recurrent: Secondary | ICD-10-CM | POA: Diagnosis not present

## 2022-09-29 LAB — CBC WITH DIFFERENTIAL/PLATELET
Abs Immature Granulocytes: 0.05 10*3/uL (ref 0.00–0.07)
Abs Immature Granulocytes: 0.06 10*3/uL (ref 0.00–0.07)
Abs Immature Granulocytes: 0.06 10*3/uL (ref 0.00–0.07)
Abs Immature Granulocytes: 0.04 K/uL (ref 0.00–0.07)
Abs Immature Granulocytes: 0.21 10*3/uL — ABNORMAL HIGH (ref 0.00–0.07)
Basophils Absolute: 0.1 10*3/uL (ref 0.0–0.1)
Basophils Absolute: 0.1 10*3/uL (ref 0.0–0.1)
Basophils Absolute: 0.1 10*3/uL (ref 0.0–0.1)
Basophils Absolute: 0.1 K/uL (ref 0.0–0.1)
Basophils Absolute: 0.1 10*3/uL (ref 0.0–0.1)
Basophils Relative: 1 %
Basophils Relative: 1 %
Basophils Relative: 1 %
Basophils Relative: 1 %
Basophils Relative: 1 %
Eosinophils Absolute: 0 10*3/uL (ref 0.0–0.5)
Eosinophils Absolute: 0 10*3/uL (ref 0.0–0.5)
Eosinophils Absolute: 0 10*3/uL (ref 0.0–0.5)
Eosinophils Absolute: 0 K/uL (ref 0.0–0.5)
Eosinophils Absolute: 0 10*3/uL (ref 0.0–0.5)
Eosinophils Relative: 0 %
Eosinophils Relative: 0 %
Eosinophils Relative: 0 %
Eosinophils Relative: 0 %
Eosinophils Relative: 0 %
HCT: 30.3 % — ABNORMAL LOW (ref 39.0–52.0)
HCT: 26.4 % — ABNORMAL LOW (ref 39.0–52.0)
HCT: 26.1 % — ABNORMAL LOW (ref 39.0–52.0)
HCT: 24.2 % — ABNORMAL LOW (ref 39.0–52.0)
HCT: 24.1 % — ABNORMAL LOW (ref 39.0–52.0)
Hemoglobin: 10.6 g/dL — ABNORMAL LOW (ref 13.0–17.0)
Hemoglobin: 9.1 g/dL — ABNORMAL LOW (ref 13.0–17.0)
Hemoglobin: 8.8 g/dL — ABNORMAL LOW (ref 13.0–17.0)
Hemoglobin: 8.3 g/dL — ABNORMAL LOW (ref 13.0–17.0)
Hemoglobin: 7.9 g/dL — ABNORMAL LOW (ref 13.0–17.0)
Immature Granulocytes: 1 %
Immature Granulocytes: 1 %
Immature Granulocytes: 1 %
Immature Granulocytes: 1 %
Immature Granulocytes: 2 %
Lymphocytes Relative: 3 %
Lymphocytes Relative: 5 %
Lymphocytes Relative: 5 %
Lymphocytes Relative: 6 %
Lymphocytes Relative: 7 %
Lymphs Abs: 0.3 10*3/uL — ABNORMAL LOW (ref 0.7–4.0)
Lymphs Abs: 0.4 10*3/uL — ABNORMAL LOW (ref 0.7–4.0)
Lymphs Abs: 0.4 10*3/uL — ABNORMAL LOW (ref 0.7–4.0)
Lymphs Abs: 0.5 K/uL — ABNORMAL LOW (ref 0.7–4.0)
Lymphs Abs: 0.6 10*3/uL — ABNORMAL LOW (ref 0.7–4.0)
MCH: 30.5 pg (ref 26.0–34.0)
MCH: 29.9 pg (ref 26.0–34.0)
MCH: 29.6 pg (ref 26.0–34.0)
MCH: 29.7 pg (ref 26.0–34.0)
MCH: 29.6 pg (ref 26.0–34.0)
MCHC: 35 g/dL (ref 30.0–36.0)
MCHC: 34.5 g/dL (ref 30.0–36.0)
MCHC: 33.7 g/dL (ref 30.0–36.0)
MCHC: 34.3 g/dL (ref 30.0–36.0)
MCHC: 32.8 g/dL (ref 30.0–36.0)
MCV: 87.3 fL (ref 80.0–100.0)
MCV: 86.8 fL (ref 80.0–100.0)
MCV: 87.9 fL (ref 80.0–100.0)
MCV: 86.7 fL (ref 80.0–100.0)
MCV: 90.3 fL (ref 80.0–100.0)
Monocytes Absolute: 0.7 10*3/uL (ref 0.1–1.0)
Monocytes Absolute: 0.9 10*3/uL (ref 0.1–1.0)
Monocytes Absolute: 1.1 10*3/uL — ABNORMAL HIGH (ref 0.1–1.0)
Monocytes Absolute: 1.2 K/uL — ABNORMAL HIGH (ref 0.1–1.0)
Monocytes Absolute: 1.5 10*3/uL — ABNORMAL HIGH (ref 0.1–1.0)
Monocytes Relative: 8 %
Monocytes Relative: 13 %
Monocytes Relative: 14 %
Monocytes Relative: 16 %
Monocytes Relative: 18 %
Neutro Abs: 8 10*3/uL — ABNORMAL HIGH (ref 1.7–7.7)
Neutro Abs: 6 10*3/uL (ref 1.7–7.7)
Neutro Abs: 6.1 10*3/uL (ref 1.7–7.7)
Neutro Abs: 5.6 K/uL (ref 1.7–7.7)
Neutro Abs: 6.3 10*3/uL (ref 1.7–7.7)
Neutrophils Relative %: 87 %
Neutrophils Relative %: 80 %
Neutrophils Relative %: 79 %
Neutrophils Relative %: 76 %
Neutrophils Relative %: 72 %
Platelets: 150 10*3/uL (ref 150–400)
Platelets: 122 10*3/uL — ABNORMAL LOW (ref 150–400)
Platelets: 129 10*3/uL — ABNORMAL LOW (ref 150–400)
Platelets: 119 K/uL — ABNORMAL LOW (ref 150–400)
Platelets: 123 10*3/uL — ABNORMAL LOW (ref 150–400)
RBC: 3.47 MIL/uL — ABNORMAL LOW (ref 4.22–5.81)
RBC: 3.04 MIL/uL — ABNORMAL LOW (ref 4.22–5.81)
RBC: 2.97 MIL/uL — ABNORMAL LOW (ref 4.22–5.81)
RBC: 2.79 MIL/uL — ABNORMAL LOW (ref 4.22–5.81)
RBC: 2.67 MIL/uL — ABNORMAL LOW (ref 4.22–5.81)
RDW: 13.9 % (ref 11.5–15.5)
RDW: 13.8 % (ref 11.5–15.5)
RDW: 13.9 % (ref 11.5–15.5)
RDW: 13.9 % (ref 11.5–15.5)
RDW: 14.3 % (ref 11.5–15.5)
Smear Review: NORMAL
Smear Review: NORMAL
Smear Review: NORMAL
WBC: 9.2 10*3/uL (ref 4.0–10.5)
WBC: 7.5 10*3/uL (ref 4.0–10.5)
WBC: 7.7 10*3/uL (ref 4.0–10.5)
WBC: 7.4 K/uL (ref 4.0–10.5)
WBC: 8.7 10*3/uL (ref 4.0–10.5)
nRBC: 0 % (ref 0.0–0.2)
nRBC: 0 % (ref 0.0–0.2)
nRBC: 0.3 % — ABNORMAL HIGH (ref 0.0–0.2)
nRBC: 0 % (ref 0.0–0.2)
nRBC: 0.2 % (ref 0.0–0.2)

## 2022-09-29 LAB — LACTIC ACID, PLASMA
Lactic Acid, Venous: 1.9 mmol/L (ref 0.5–1.9)
Lactic Acid, Venous: 2.1 mmol/L (ref 0.5–1.9)
Lactic Acid, Venous: 1.9 mmol/L (ref 0.5–1.9)

## 2022-09-29 LAB — GLUCOSE, CAPILLARY
Glucose-Capillary: 141 mg/dL — ABNORMAL HIGH (ref 70–99)
Glucose-Capillary: 146 mg/dL — ABNORMAL HIGH (ref 70–99)
Glucose-Capillary: 149 mg/dL — ABNORMAL HIGH (ref 70–99)
Glucose-Capillary: 133 mg/dL — ABNORMAL HIGH (ref 70–99)
Glucose-Capillary: 125 mg/dL — ABNORMAL HIGH (ref 70–99)

## 2022-09-29 LAB — PHOSPHORUS
Phosphorus: 1.7 mg/dL — ABNORMAL LOW (ref 2.5–4.6)
Phosphorus: 2.4 mg/dL — ABNORMAL LOW (ref 2.5–4.6)

## 2022-09-29 LAB — COMPREHENSIVE METABOLIC PANEL WITH GFR
ALT: 41 U/L (ref 0–44)
AST: 146 U/L — ABNORMAL HIGH (ref 15–41)
Albumin: 2.5 g/dL — ABNORMAL LOW (ref 3.5–5.0)
Alkaline Phosphatase: 87 U/L (ref 38–126)
Anion gap: 12 (ref 5–15)
BUN: 47 mg/dL — ABNORMAL HIGH (ref 8–23)
CO2: 23 mmol/L (ref 22–32)
Calcium: 7.2 mg/dL — ABNORMAL LOW (ref 8.9–10.3)
Chloride: 103 mmol/L (ref 98–111)
Creatinine, Ser: 1.4 mg/dL — ABNORMAL HIGH (ref 0.61–1.24)
GFR, Estimated: 50 mL/min — ABNORMAL LOW
Glucose, Bld: 150 mg/dL — ABNORMAL HIGH (ref 70–99)
Potassium: 3.9 mmol/L (ref 3.5–5.1)
Sodium: 138 mmol/L (ref 135–145)
Total Bilirubin: 1.2 mg/dL (ref 0.3–1.2)
Total Protein: 5 g/dL — ABNORMAL LOW (ref 6.5–8.1)

## 2022-09-29 LAB — BASIC METABOLIC PANEL WITH GFR
Anion gap: 8 (ref 5–15)
BUN: 40 mg/dL — ABNORMAL HIGH (ref 8–23)
CO2: 22 mmol/L (ref 22–32)
Calcium: 7.1 mg/dL — ABNORMAL LOW (ref 8.9–10.3)
Chloride: 105 mmol/L (ref 98–111)
Creatinine, Ser: 1.19 mg/dL (ref 0.61–1.24)
GFR, Estimated: >60 mL/min
Glucose, Bld: 161 mg/dL — ABNORMAL HIGH (ref 70–99)
Potassium: 3.9 mmol/L (ref 3.5–5.1)
Sodium: 135 mmol/L (ref 135–145)

## 2022-09-29 LAB — BLOOD GAS, VENOUS
Acid-base deficit: 0.3 mmol/L (ref 0.0–2.0)
Bicarbonate: 24.1 mmol/L (ref 20.0–28.0)
O2 Content: 2 L/min
O2 Saturation: 45.8 %
Patient temperature: 35.6
pCO2, Ven: 36 mmHg — ABNORMAL LOW (ref 44–60)
pH, Ven: 7.43 (ref 7.25–7.43)
pO2, Ven: 34 mmHg (ref 32–45)

## 2022-09-29 LAB — BASIC METABOLIC PANEL
Anion gap: 8 (ref 5–15)
BUN: 35 mg/dL — ABNORMAL HIGH (ref 8–23)
CO2: 24 mmol/L (ref 22–32)
Calcium: 7.4 mg/dL — ABNORMAL LOW (ref 8.9–10.3)
Chloride: 102 mmol/L (ref 98–111)
Creatinine, Ser: 1.06 mg/dL (ref 0.61–1.24)
GFR, Estimated: >60 mL/min (ref >60)
Glucose, Bld: 135 mg/dL — ABNORMAL HIGH (ref 70–99)
Potassium: 3.7 mmol/L (ref 3.5–5.1)
Sodium: 134 mmol/L — ABNORMAL LOW (ref 135–145)

## 2022-09-29 LAB — ECHOCARDIOGRAM LIMITED
Height: 72 in
Weight: 3178.15 [oz_av]

## 2022-09-29 LAB — MAGNESIUM
Magnesium: 2.3 mg/dL (ref 1.7–2.4)
Magnesium: 2.2 mg/dL (ref 1.7–2.4)

## 2022-09-29 LAB — PREPARE RBC (CROSSMATCH)

## 2022-09-29 MED ORDER — HYDROMORPHONE HCL 1 MG/ML IJ SOLN
INTRAMUSCULAR | Status: AC
  Filled 2022-09-29: qty 1

## 2022-09-29 MED ORDER — SODIUM CHLORIDE 0.9% IV SOLUTION: Freq: Once | INTRAVENOUS | Status: DC

## 2022-09-29 MED ORDER — HYDROMORPHONE HCL 1 MG/ML IJ SOLN
1.0000 mg | INTRAMUSCULAR | Status: DC | PRN
  Administered 2022-09-29 – 2022-10-01 (×2): 1 mg via INTRAVENOUS
  Filled 2022-09-29: qty 1

## 2022-09-29 MED ORDER — SODIUM CHLORIDE 0.9% IV SOLUTION: Freq: Once | INTRAVENOUS | Status: AC

## 2022-09-29 MED ORDER — SODIUM CHLORIDE 0.9 % IV SOLN
2.0000 g | Freq: Three times a day (TID) | INTRAVENOUS | Status: DC
  Administered 2022-09-29 – 2022-09-30 (×4): 2 g via INTRAVENOUS
  Filled 2022-09-29: qty 12.5
  Filled 2022-09-29: qty 2
  Filled 2022-09-29 (×2): qty 12.5
  Filled 2022-09-29: qty 2

## 2022-09-29 MED ORDER — IOHEXOL 350 MG/ML SOLN
125.0000 mL | Freq: Once | INTRAVENOUS | Status: AC | PRN
  Administered 2022-09-29: 125 mL via INTRAVENOUS

## 2022-09-29 MED ORDER — LACTATED RINGERS IV BOLUS
500.0000 mL | Freq: Once | INTRAVENOUS | Status: AC
  Administered 2022-09-29: 500 mL via INTRAVENOUS

## 2022-09-29 MED ORDER — ACETAMINOPHEN 10 MG/ML IV SOLN
1000.0000 mg | Freq: Four times a day (QID) | INTRAVENOUS | Status: AC | PRN
  Administered 2022-09-30 (×3): 1000 mg via INTRAVENOUS
  Filled 2022-09-29 (×3): qty 100

## 2022-09-29 MED ORDER — SODIUM PHOSPHATES 45 MMOLE/15ML IV SOLN
20.0000 mmol | Freq: Once | INTRAVENOUS | Status: AC
  Administered 2022-09-29: 20 mmol via INTRAVENOUS
  Filled 2022-09-29: qty 6.67

## 2022-09-29 MED ORDER — LACTATED RINGERS IV SOLN: INTRAVENOUS | Status: AC

## 2022-09-29 NOTE — Consult Note (Signed)
[facility] for Electrolyte Monitoring and Replacement   Recent Labs: Potassium (mmol/L)  Date Value  09/29/2022 3.7   Magnesium (mg/dL)  Date Value  09/29/2022 2.3   Calcium (mg/dL)  Date Value  09/29/2022 7.4 (L)   Albumin (g/dL)  Date Value  09/27/2022 3.2 (L)   Phosphorus (mg/dL)  Date Value  09/29/2022 1.7 (L)   Sodium (mmol/L)  Date Value  09/29/2022 134 (L)   Corrected Ca: 8.0 mg/dL  Assessment  82 y.o. male presenting with sepsis. PMH significant for anemia, anxiety, depression, CAD, ICM s/p PCI, HFrEF (2018 EF 45%), CVA, HTN, HLD, GERD, hypothyroidism. Pharmacy has been consulted to monitor and replace electrolytes.  Diet: Vital 1.5'@50ml'$ /hr + free water flushes 76m q4 hours   Goal of Therapy: Electrolytes WNL  Plan:  20 mmol sodium phosphate IV x 1 Recheck electrolytes in am  Thank you for allowing pharmacy to be a part of this patient's care.  RVallery Sa PharmD, BCPS 09/29/2022 7:48 AM

## 2022-09-29 NOTE — Progress Notes (Addendum)
09/28/22 11:45 P.M. RN noticed an increase in output from the ostomy. Output has remained bright, red and bloody since it was placed. Surgery and critical care providers aware of grey/dusky/black discoloration of the stoma and bloody output since ostomy placement.  At this time, 315 mL of blood with large clots was emptied from the ostomy. Rufina Falco NP & Herbert Pun MD were made aware at 11:50 P.M.. Ostomy has been putting out 70-100 mL of bloody output per 12 hour shift prior to this new increase in output.  09/29/22 1:31 A.M. Additional 110 mL of blood/blood clots removed from ostomy. Rufina Falco NP made aware. See orders.   3:24 A.M. Additional 140 mL of output recorded.   6:26 A.M. By end of shift, an additional 225 mL of bright, red blood was removed containing large clots. Total output for the shift was 790 mL of bright red, bloody output containing increasingly large clots. Rufina Falco NP made aware. Labs remain stable, but pressor requirements have increased throughout the night - see eMAR.

## 2022-09-29 NOTE — Progress Notes (Signed)
Pharmacy Antibiotic Note  Daniel Austin is a 82 y.o. male admitted on 10/13/2022 with sepsis. PMH significant for anemia, anxiety, depression, CAD, ICM s/p PCI, HFrEF (2018 EF 45%), CVA, HTN, HLD, GERD, hypothyroidism. He underwent sigmoid colectomy for ischemic changes likely secondary to left inguinal hernia on 1/3. Pharmacy has been consulted for cefepime dosing. Renal function has continued to improve since admission based on SCr  Plan: Day 4 of antibiotics adjust cefepime to 2 grams IV every 8 hours Patient is also on metronidazole 500 mg IV Q12H Continue to monitor renal function and follow culture results   Height: 6' (182.9 cm) Weight: 90.1 kg (198 lb 10.2 oz) IBW/kg (Calculated) : 77.6  Temp (24hrs), Avg:100 F (37.8 C), Min:98.8 F (37.1 C), Max:100.5 F (38.1 C)  Recent Labs  Lab 10/08/2022 0846 10/09/2022 1959 09/27/22 0437 09/28/22 0438 09/28/22 1325 09/28/22 1933 09/29/22 0117  WBC  --  15.0* 11.2* 8.3  --  9.0 9.2  CREATININE 1.92* 1.59* 1.46* 1.12  --   --  1.06  LATICACIDVEN 7.2* 3.5* 3.1* 2.2* 2.1*  --  1.9     Estimated Creatinine Clearance: 59 mL/min (by C-G formula based on SCr of 1.06 mg/dL).    Allergies  Allergen Reactions   Beta Adrenergic Blockers Other (See Comments)    Second Degree Heart Block   Lipitor [Atorvastatin] Other (See Comments)    Myalgia     Antimicrobials this admission: 1/3 vancomycin x1 1/3 cefepime >>  1/3 metronidazole >>   Microbiology results: 1/3 BCx: NG3D 1/3 UCx: NG final 1/3 GI panel negative  Thank you for allowing pharmacy to be a part of this patient's care.  Vallery Sa, PharmD, BCPS 09/29/2022 8:05 AM

## 2022-09-29 NOTE — Progress Notes (Signed)
Patient ID: Daniel Austin, male   DOB: 07-Sep-1941, 82 y.o.   MRN: [id number]     [facility] Day(s): 3.   Interval History: Patient seen and examined, no acute events or new complaints overnight. Patient reports feeling okay this morning.  He denies any complaint this morning.  No nausea or vomiting.  He is having increasing output to the colostomy.  Colostomy output seems to be bloody.  Stable hemoglobin.  CTA of the abdomen negative for active bleeding.  Vital signs in last 24 hours: [min-max] current  Temp:  [98.3 F (36.8 C)-100.5 F (38.1 C)] 98.3 F (36.8 C) (01/06 0800) Pulse Rate:  [35-147] 108 (01/06 0800) Resp:  [0-30] 28 (01/06 0800) BP: (95-144)/(47-87) 122/70 (01/06 0800) SpO2:  [96 %-100 %] 100 % (01/06 0800) Arterial Line BP: (77-190)/(24-72) 104/49 (01/06 0800) Weight:  [90.1 kg] 90.1 kg (01/06 0433)     Height: 6' (182.9 cm) Weight: 90.1 kg BMI (Calculated): 26.93   Physical Exam:  Constitutional: alert, cooperative and no distress  Respiratory: breathing non-labored at rest  Cardiovascular: regular rate and sinus rhythm  Gastrointestinal: soft, non-tender, and non-distended.  Colostomy patent.  Bloody output.  Labs:     Latest Ref Rng & Units 09/29/2022    1:17 AM 09/28/2022    7:33 PM 09/28/2022    4:38 AM  CBC  WBC 4.0 - 10.5 K/uL 9.2  9.0  8.3   Hemoglobin 13.0 - 17.0 g/dL 10.6  10.9  11.2   Hematocrit 39.0 - 52.0 % 30.3  31.2  32.1   Platelets 150 - 400 K/uL 150  147  139       Latest Ref Rng & Units 09/29/2022    1:17 AM 09/28/2022   11:17 PM 09/28/2022    4:17 PM  CMP  Glucose 70 - 99 mg/dL 135     BUN 8 - 23 mg/dL 35     Creatinine 0.61 - 1.24 mg/dL 1.06     Sodium 135 - 145 mmol/L 134     Potassium 3.5 - 5.1 mmol/L 3.7  3.5  3.3   Chloride 98 - 111 mmol/L 102     CO2 22 - 32 mmol/L 24     Calcium 8.9 - 10.3 mg/dL 7.4       Imaging studies: No new pertinent imaging studies   Assessment/Plan:  82 y.o. male with ischemic  sigmoid colitis 3 Days Post-Op s/p Hartman's procedure.  -Patient recovering slowly but adequately -Stable vital signs -Continue tolerating extubation -Seems in no distress -Colostomy output seems to be bloody.  Hemoglobin stable.  CT of the abdomen negative for suspected bleeding -Blood coming from colostomy might be old since the colostomy was matured yesterday by Dr. Christian Mate -Will continue monitoring -Will continue trickle feeds until we are sure that he is not having GI bleeding -No contraindication for getting out of bed as tolerated -Continue medical management as per ICU team  Arnold Long, MD

## 2022-09-29 NOTE — Progress Notes (Signed)
NAME:  Daniel Austin, MRN:  [id number], DOB:  [birth year], LOS: 3 ADMISSION DATE:  09/30/2022, CHIEF COMPLAINT:  Ischemic colitis   History of Present Illness:   82 yo M presenting to [facility] ED from home via EMS on 10/19/2022 with a chief complaint of sepsis alert. History provided by daughter and spouse bedside as patient is altered. Patient was in his normal state of health until he took a laxative dose 2 days ago then had uncontrollable diarrhea.  Spouse also described nausea with dry heaves and some mild emesis with poor p.o. intake. She denied fever, chest pain & shortness of breath.  They reported he fell and when the daughter arrived to assist her mother he appeared tremulous and diaphoretic. Family denies EtOH use/ NSAID use. EMS reported being called out due to nausea/vomiting and diarrhea, and the patient had a syncopal episode when first responders stood him up.  EMS reported hypotension in the field patient received a liter NS bolus and was given 4 mg of IV Zofran prior to arrival.   ED course: Upon arrival patient confused and severely hypotensive with tachypnea.  Sepsis protocol initiated.  Labs significant for hyponatremia (possible acute on chronic), hyperglycemia, AKI, AGMA, transaminitis, leukocytosis with lactic acidosis and elevated troponin.  Imaging suspicious for UTI, also revealing severe gastritis.  Post total 2.5 L IV fluid resuscitation between EMS and ED patient remained hypotensive requiring low-dose peripheral vasopressor support. ABG: 7.30/ 22/67/10.8   CXR 09/24/2022: Low lung volumes without active cardiopulmonary disease CT head without contrast 10/09/2022: No acute intracranial abnormality.  Chronic right frontal parietal and right parietoccipital infarcts.  Generalized cerebral atrophy and microvascular disease changes of the supratentorial brain. CT angio chest PE 10/03/2022: No evidence of pulmonary embolism or other acute intrathoracic process.  Marked severity coronary  artery calcification, small anterior pericardial effusion. CT abdomen and pelvis with contrast 10/04/2022: Findings consistent with marked severity gastritis and/or duodenitis.  Mild to moderate severity diffuse enteritis.  Large amount of stool within the mid to distal sigmoid colon and rectum consistent with constipation.  Sigmoid diverticulosis, 3.8 cm x 3.7 cm x 3.8 cm infrarenal AAA.  7 cm x 4.8 cm left inguinal hernia.  Mild diffuse urinary bladder wall thickening which may represent cystitis.    PCCM consulted for admission due to circulatory shock secondary to hypovolemia and suspected sepsis.  Pertinent  Medical History  Anemia Anxiety & depression CAD ICM status post PCI HFrEF (echo 2018 LVEF 45%) CVA Heart block status post PPM 12/08 Hypertension Hypercholesterolemia GERD Hypothyroidism Gastric ulcer status post-repair  Significant Hospital Events: Including procedures, antibiotic start and stop dates in addition to other pertinent events   10/17/2022: Admit to ICU with circulatory shock secondary to hypovolemia and sepsis POA in the setting of gastritis and possible UTI requiring peripheral vasopressor support. 1/3: Ischemic sigmoid colitis, likely complicated secondary to sliding left inguinal hernia. EXPLORATORY LAPAROTOMY; SIGMOID COLECTOMY WITH END COLOSTOMY  CREATION (Abdomen)  1/4: Extubated 1/5: Awake, comfortable in bed. Blood from the stoma noted 1/6: more blood from the stoma overnight, CTA of the abdomen reassuring  Interim History / Subjective:  Patient awake today, reporting pain in the abdomen. He is more interactive.  Objective   Blood pressure 122/70, pulse (!) 108, temperature 98.3 F (36.8 C), temperature source Oral, resp. rate (!) 28, height 6' (1.829 m), weight 90.1 kg, SpO2 100 %.        Intake/Output Summary (Last 24 hours) at 09/29/2022 3220 Last data filed at  09/29/2022 0800 Gross per 24 hour  Intake 3593.5 ml  Output 2455 ml  Net 1138.5 ml     Filed Weights   09/27/22 0500 09/28/22 0500 09/29/22 0433  Weight: 90.3 kg 90.7 kg 90.1 kg    Examination: Physical Exam Constitutional:      General: He is not in acute distress.    Appearance: He is ill-appearing.  HENT:     Head: Normocephalic.     Mouth/Throat:     Mouth: Mucous membranes are moist.  Eyes:     Extraocular Movements: Extraocular movements intact.  Cardiovascular:     Rate and Rhythm: Normal rate and regular rhythm.     Pulses: Normal pulses.     Heart sounds: Normal heart sounds.  Pulmonary:     Effort: Pulmonary effort is normal.     Breath sounds: Normal breath sounds.  Abdominal:     Palpations: Abdomen is soft.     Comments: Clean incision site, dark appearing intestinal mucosa extruding through the colostomy.   Musculoskeletal:        General: Normal range of motion.  Skin:    General: Skin is warm.  Neurological:     General: No focal deficit present.     Mental Status: He is alert.     Assessment & Plan:   Neurology #Toxic Metabolic Encephalopathy  Improved. Avoid sedating medications.  Cardiovascular #Distributive Shock #Demand Ischemia #History of CAD (DES mid LAD)  Developed distributive shock secondary to a combination of ischemic bowel, possible peritonitis, and fluid shifts following colonic resection. Patient had a TTE that shows low normal EF, with no notable hypokinesis. TTE with pericardial effusion previously noted on CT chest. Cardiology consulted and are following. No signs of pulsus paradoxus or JV distention on physical exam.  -goal MAP of 65 mmHg -cardiology consult appreciated -continue nor-epinephrine and vasopressin  Pulmonary Extubated successfully, currently on room air with no active issues  Gastrointestinal #Ischemic Colitis  Patient is s/p Hartman's procedure secondary to ischemic colitis from a left inguinal hernia. His stoma is now matured with mucosal ischemic changes that surgery is closely  following. Does have blood through the stoma with stable hemoglobin. Repeat CTA of the abdomen is re-assuring.  -increase trickle feeds -PPI for prophylaxis  Renal #AKI #Metabolic acidosis #Hypokalemia  AKI on presentation has improved following the administration of sodium bicarbonate. Repeat creatinine down to 1.06, with associated hypokalemia. Will initiate repletion of electrolytes and continue to avoid nephrotoxic medications.  -bid BMP -avoid nephrotoxins -LR at 75 ml/hour  Endocrine #Hypothyroidism  Insulin sliding scale for glycemic control. Continue home levothyroxine.  Hem/Onc  Lovenox for DVT prophylaxis, holding with blood from the stoma. Continue to monitor H/H and platelets.   ID #Ischemic Colitis  Broad spectrum antibiotics given concern of infection secondary to perforated bowel (egress of discolored fluid from the peritoneal cavity noted in the operative report).  -Continue Cefepime and Metronidazole  Best Practice (right click and "Reselect all SmartList Selections" daily)   Diet/type: tubefeeds DVT prophylaxis: LMWH GI prophylaxis: PPI Lines: Central line and yes and it is still needed Foley:  Yes, and it is still needed Code Status:  full code Last date of multidisciplinary goals of care discussion [09/29/2022]  Labs   CBC: Recent Labs  Lab 09/25/2022 0101 10/12/2022 1959 09/27/22 0437 09/28/22 0438 09/28/22 1933 09/29/22 0117  WBC 17.3* 15.0* 11.2* 8.3 9.0 9.2  NEUTROABS 14.9*  --   --   --  7.9* 8.0*  HGB 13.8  13.5 12.5* 11.2* 10.9* 10.6*  HCT 41.4 38.7* 35.6* 32.1* 31.2* 30.3*  MCV 90.0 86.8 87.3 86.8 87.2 87.3  PLT 301 228 184 139* 147* 150     Basic Metabolic Panel: Recent Labs  Lab 10/18/2022 0313 10/14/2022 0846 10/08/2022 1212 09/25/2022 1959 09/27/22 0437 09/28/22 0438 09/28/22 1617 09/28/22 2317 09/29/22 0117  NA  --  131* 132* 131* 133* 132*  --   --  134*  K  --  4.0  --  4.2 3.9 2.8* 3.3* 3.5 3.7  CL  --  100  --  102 101  97*  --   --  102  CO2  --  14*  --  15* 20* 25  --   --  24  GLUCOSE  --  159*  --  158* 127* 139*  --   --  135*  BUN  --  37*  --  42* 38* 36*  --   --  35*  CREATININE  --  1.92*  --  1.59* 1.46* 1.12  --   --  1.06  CALCIUM  --  8.7*  --  7.5* 7.3* 6.9*  --   --  7.4*  MG 2.3  --   --  2.6* 2.5* 2.4  --   --  2.3  PHOS 4.3  --   --  6.0* 5.2* 2.8  --   --  1.7*    GFR: Estimated Creatinine Clearance: 59 mL/min (by C-G formula based on SCr of 1.06 mg/dL). Recent Labs  Lab 09/24/2022 0101 10/20/2022 0313 09/27/22 0437 09/28/22 0438 09/28/22 1325 09/28/22 1933 09/29/22 0117  PROCALCITON <0.10  --  23.82 19.94  --   --   --   WBC 17.3*   < > 11.2* 8.3  --  9.0 9.2  LATICACIDVEN 5.3*   < > 3.1* 2.2* 2.1*  --  1.9   < > = values in this interval not displayed.     Liver Function Tests: Recent Labs  Lab 10/23/2022 0101 10/18/2022 1959 09/27/22 0437  AST 42* 66* 68*  ALT '13 15 17  '$ ALKPHOS 70 46 38  BILITOT 1.2 1.1 1.2  PROT 6.6 5.4* 5.4*  ALBUMIN 3.9 3.2* 3.2*    Recent Labs  Lab 10/23/2022 0101  LIPASE 61*    No results for input(s): "AMMONIA" in the last 168 hours.  ABG    Component Value Date/Time   PHART 7.46 (H) 09/27/2022 0838   PCO2ART 30 (L) 09/27/2022 0838   PO2ART 101 09/27/2022 0838   HCO3 21.3 09/27/2022 0838   ACIDBASEDEF 1.5 09/27/2022 0838   O2SAT 97.6 09/27/2022 0838     Coagulation Profile: Recent Labs  Lab 09/24/2022 2111  INR 2.0*     Cardiac Enzymes: No results for input(s): "CKTOTAL", "CKMB", "CKMBINDEX", "TROPONINI" in the last 168 hours.  HbA1C: Hgb A1c MFr Bld  Date/Time Value Ref Range Status  08/29/2022 10:16 AM 6.1 4.6 - 6.5 % Final    Comment:    Glycemic Control Guidelines for People with Diabetes:Non Diabetic:  <6%Goal of Therapy: <7%Additional Action Suggested:  >8%   03/14/2021 11:51 AM 6.1 4.6 - 6.5 % Final    Comment:    Glycemic Control Guidelines for People with Diabetes:Non Diabetic:  <6%Goal of Therapy:  <7%Additional Action Suggested:  >8%     CBG: Recent Labs  Lab 09/28/22 1606 09/28/22 1923 09/28/22 2318 09/29/22 0345 09/29/22 0750  GLUCAP 140* 128* 128* 141* 146*  Past Medical History:  He,  has a past medical history of Anemia, Anxiety, Arthritis, CAD (coronary artery disease), Cardiomyopathy ([place]), Carotid artery stenosis, CHF (congestive heart failure) ([place]), Depression, Fractures, multiple, GERD (gastroesophageal reflux disease), Heart block, Hypercholesterolemia, Hyperglycemia, Hypertension, Hypothyroidism, Knee pain, Presence of permanent cardiac pacemaker, Shortness of breath dyspnea, Stroke [facility]), Syncope, and Ulcer of gastric fundus.   Surgical History:   Past Surgical History:  Procedure Laterality Date   CARDIAC CATHETERIZATION     CATARACT EXTRACTION W/PHACO Right 03/07/2021   Procedure: CATARACT EXTRACTION PHACO AND INTRAOCULAR LENS PLACEMENT (IOC) RIGHT 8.69 00:55.2;  Surgeon: Birder Robson, MD;  Location: [place];  Service: Ophthalmology;  Laterality: Right;   COLONOSCOPY N/A 07/06/2021   Procedure: COLONOSCOPY;  Surgeon: Annamaria Helling, DO;  Location: [facility] ENDOSCOPY;  Service: Gastroenterology;  Laterality: N/A;   CORONARY ANGIOPLASTY     stents x 3   heart stint  11/04/2014   [facility]   HERNIA REPAIR     inguinal   INSERT / REPLACE / REMOVE PACEMAKER     JOINT REPLACEMENT     right knee   KNEE ARTHROPLASTY Right 02/08/2016   Procedure: COMPUTER ASSISTED TOTAL KNEE ARTHROPLASTY;  Surgeon: Dereck Leep, MD;  Location: [facility];  Service: Orthopedics;  Laterality: Right;   KNEE ARTHROPLASTY Left 06/27/2016   Procedure: COMPUTER ASSISTED TOTAL KNEE ARTHROPLASTY;  Surgeon: Dereck Leep, MD;  Location: [facility];  Service: Orthopedics;  Laterality: Left;   LAPAROTOMY N/A 10/18/2022   Procedure: EXPLORATORY LAPAROTOMY; SIGMOID COLECTOMY WITH END COLOSTOMY CREATION;  Surgeon: Ronny Bacon, MD;  Location: [facility];  Service:  General;  Laterality: N/A;   PACEMAKER INSERTION  08/2007   PACEMAKER INSERTION N/A 11/16/2015   Procedure: INSERTION PACEMAKER/ PACEMAKER CHANGE OUT;  Surgeon: Isaias Cowman, MD;  Location: [facility] OR[facility];  Service: Cardiovascular;  Laterality: N/A;   PERCUTANEOUS PLACEMENT INTRAVASCULAR STENT CERVICAL CAROTID ARTERY     twice, right carotid artery     Social History:   reports that he has never smoked. He has never used smokeless tobacco. He reports that he does not drink alcohol and does not use drugs.   Family History:  His family history includes Cancer in his sister and sister; Colon polyps in his father; Diabetes in his sister; Heart disease in his father and mother; Thyroid disease in his father, sister, and sister. There is no history of Colon cancer or Prostate cancer.   Allergies Allergies  Allergen Reactions   Beta Adrenergic Blockers Other (See Comments)    Second Degree Heart Block   Lipitor [Atorvastatin] Other (See Comments)    Myalgia      Home Medications  Prior to Admission medications   Medication Sig Start Date End Date Taking? Authorizing Provider  amLODipine (NORVASC) 5 MG tablet TAKE 1 TABLET (5 MG TOTAL) BY MOUTH DAILY. 08/28/21  Yes Einar Pheasant, MD  Aspirin 81 MG EC tablet Take 81 mg by mouth daily.   Yes [provider]  citalopram (CELEXA) 20 MG tablet TAKE 1 TABLET BY MOUTH EVERY DAY 05/31/22  Yes Einar Pheasant, MD  clopidogrel (PLAVIX) 75 MG tablet TAKE 1 TABLET BY MOUTH EVERY DAY 07/12/22  Yes Einar Pheasant, MD  levothyroxine (SYNTHROID) 125 MCG tablet TAKE 1 TABLET BY MOUTH EVERY DAY BEFORE BREAKFAST 02/26/22  Yes Einar Pheasant, MD  lisinopril (ZESTRIL) 40 MG tablet TAKE 1 TABLET BY MOUTH EVERY DAY 03/19/22  Yes Einar Pheasant, MD  metoprolol tartrate (LOPRESSOR) 25 MG tablet TAKE 1  TABLET BY MOUTH TWICE A DAY 08/27/22  Yes Einar Pheasant, MD  pantoprazole (PROTONIX) 40 MG tablet TAKE 1 TABLET BY MOUTH EVERY DAY 05/04/22  Yes Einar Pheasant, MD  rosuvastatin (CRESTOR) 20 MG tablet TAKE 1 TABLET BY MOUTH EVERY DAY AT NIGHT 05/04/22  Yes Einar Pheasant, MD  acetaminophen (TYLENOL) 500 MG tablet Take 1,000 mg by mouth every 6 (six) hours as needed.    [provider]  fexofenadine (ALLEGRA) 180 MG tablet Take 180 mg by mouth daily. As needed    [provider]  LORazepam (ATIVAN) 1 MG tablet 1/2-1 TABLET AT BEDTIME AS NEEDED . 09/11/22   Einar Pheasant, MD  nitroGLYCERIN (NITROSTAT) 0.4 MG SL tablet Place 0.4 mg under the tongue every 5 (five) minutes as needed. If a third tablet is needed please call 911. Patient not taking: Reported on 09/30/2022    [provider]  triamcinolone (NASACORT) 55 MCG/ACT AERO nasal inhaler Place into the nose. Patient not taking: Reported on 10/24/2022 08/28/18   [provider]  triamcinolone cream (KENALOG) 0.1 % Apply 1 application topically 2 (two) times daily. Patient not taking: Reported on 10/23/2022 09/25/17   Einar Pheasant, MD     Critical care time: 64 minutes    Armando Reichert, MD [facility] 09/29/2022 8:37 AM

## 2022-09-29 NOTE — Progress Notes (Signed)
Notified by primary RN of increased  bloody output containing large clots from the stoma.Total output for the shift was 790 mL of bright red, bloody output containing large clots. On assessment the stoma site appeared dusky and pale. Patient is hemodynamically stable with stable Hemoglobin. Discussed findings with on call general surgery who recommend continued monitoring of Hemodynamics and if unstable with drop in hemoglobin to obtain CT abd/pelvis.       CT Abdomen/Pelvis obtained and showed IMPRESSION: 1. Stable 4.4 cm infrarenal abdominal aortic aneurysm. Recommend follow-up every 12 months and vascular consultation. This recommendation follows ACR consensus guidelines: White Paper of the ACR Incidental Findings Committee II on Vascular Findings. J Am Coll Radiol 2013; 10:789-794. 2. Stable 2.4 cm right common iliac artery and 2.4 cm left internal iliac artery aneurysms. 3. 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 Distally patent supplying the left colic artery. Circumferential thickening of the terminal descending colon may simply represent postsurgical edema but is nonspecific. 4. Surgical changes of descending colostomy and Hartmann pouch formation with small free intraperitoneal gas and fluid, likely postsurgical in nature. Multiple high-grade loculated collections within the sigmoid mesentery adjacent to the terminal portion of the inferior mesenteric artery likely reflecting small mesenteric hematoma related to sigmoid resection. No active extravasation identified. 5. Previously noted prominent small bowel wall thickening has improved in the interval. The small bowel appears diffusely fluid-filled, however, suggesting changes of an underlying adynamic ileus. 6. Marked circumferential bladder wall thickening suggesting superimposed infectious or inflammatory cystitis. Correlation with urinalysis and urine culture may be helpful. 7. Interval  development of small bilateral pleural effusions, bibasilar atelectasis, and developing anasarca with diffuse body wall and presacral edema.   Rufina Falco, DNP, CCRN, FNP-C, AGACNP-BC Acute Care & Family Nurse Practitioner  [facility]  See Amion for personal pager PCCM on call pager [phone] until 7 am

## 2022-09-29 NOTE — Progress Notes (Signed)
SUBJECTIVE: Patient is an 82 year old male with past medical history significant for CAD, cardiomyopathy, anemia, anxiety, arthritis, carotid artery stenosis, CHF, GERD, HLD, HTN, hypothyroidism, PPM implant, CVA.   Patient presented to ED on 09/25/2022 via EMS with chief complaint of nausea, vomiting, and diarrhea. Patient had syncopal episode with EMS attempted to stand him up. Patient found to be hypotensive, suspected sepsis.   Patient underwent exploratory laparotomy, sigmoid resection with end colostomy for ischemic sigmoid colitis on 10/05/2022.   Echo on 09/27/22 EF 45-50%, grade I DD, moderate size pericardial effusion.    Vitals:   09/29/22 1430 09/29/22 1445 09/29/22 1500 09/29/22 1515  BP:   109/61   Pulse: 82 (!) 46 82 74  Resp: (!) 34 (!) 34 (!) 32 (!) 31  Temp:      TempSrc:      SpO2: 100% 99% 99% 99%  Weight:      Height:        Intake/Output Summary (Last 24 hours) at 09/29/2022 1603 Last data filed at 09/29/2022 1400 Gross per 24 hour  Intake 3571.64 ml  Output 2700 ml  Net 871.64 ml    LABS: Basic Metabolic Panel: Recent Labs    09/29/22 0117 09/29/22 1347  NA 134* 135  K 3.7 3.9  CL 102 105  CO2 24 22  GLUCOSE 135* 161*  BUN 35* 40*  CREATININE 1.06 1.19  CALCIUM 7.4* 7.1*  MG 2.3 2.2  PHOS 1.7* 2.4*   Liver Function Tests: Recent Labs    09/28/2022 1959 09/27/22 0437  AST 66* 68*  ALT 15 17  ALKPHOS 46 38  BILITOT 1.1 1.2  PROT 5.4* 5.4*  ALBUMIN 3.2* 3.2*   No results for input(s): "LIPASE", "AMYLASE" in the last 72 hours. CBC: Recent Labs    09/29/22 1054 09/29/22 1347  WBC 7.5 7.7  NEUTROABS 6.0 6.1  HGB 9.1* 8.8*  HCT 26.4* 26.1*  MCV 86.8 87.9  PLT 122* 129*   Cardiac Enzymes: No results for input(s): "CKTOTAL", "CKMB", "CKMBINDEX", "TROPONINI" in the last 72 hours. BNP: Invalid input(s): "POCBNP" D-Dimer: No results for input(s): "DDIMER" in the last 72 hours. Hemoglobin A1C: No results for input(s): "HGBA1C" in the last 72  hours. Fasting Lipid Panel: Recent Labs    09/27/22 0437  TRIG 41   Thyroid Function Tests: No results for input(s): "TSH", "T4TOTAL", "T3FREE", "THYROIDAB" in the last 72 hours.  Invalid input(s): "FREET3" Anemia Panel: No results for input(s): "VITAMINB12", "FOLATE", "FERRITIN", "TIBC", "IRON", "RETICCTPCT" in the last 72 hours.   PHYSICAL EXAM General: Well developed, well nourished, in no acute distress HEENT:  Normocephalic and atramatic Neck:  No JVD.  Lungs: Clear bilaterally to auscultation and percussion. Heart: HRRR . Normal S1 and S2 without gallops or murmurs.  Abdomen: Bowel sounds are positive, abdomen soft and non-tender  Msk:  Back normal, normal gait. Normal strength and tone for age. Extremities: No clubbing, cyanosis or edema.   Neuro: Alert and oriented X 3. Psych:  Good affect, responds appropriately  TELEMETRY: sinus rhythm, HR 72 bpm  ASSESSMENT AND PLAN: Patient seen resting comfortably in bed. Denies chest pain, shortness of breath, dizziness. B/p and heart rate well controlled. Limited echo today revealed EF 60-65%, small pericardial effusion with no evidence of tamponade. Will continue to follow closely.   Principal Problem:   Shock circulatory ([place]) Active Problems:   Gangrenous ischemic colitis ([place])   Incarcerated left inguinal hernia   Sepsis ([place])   Lactic acidosis  Malnutrition of moderate degree    Carnesha Maravilla, FNP-C 09/29/2022 4:03 PM

## 2022-09-29 NOTE — Progress Notes (Signed)
Patient increasingly hypotensive and reporting discomfort. Vasopressor requirements increasing. Given blood from the stoma, sent a stat repeat CBC and Lactic acid that do not show a drop in his hemoglobin or hematocrit. Lactic acid returned at 2.1. Given his pericardial effusion, concern for developing tamponade. Poor cardiac windows for POCUS at the bedside. Contacted cardiology and spoke to Dr. Humphrey Rolls. He recommended I order a STAT TTE to evaluate for any signs of tamponade. Will administer a fluid bolus pending the TTE.  Armando Reichert, MD [facility] 09/29/2022 2:29 PM

## 2022-09-29 NOTE — Progress Notes (Signed)
Echocardiogram 2D Echocardiogram has been performed. STAT Limited echo requested and completed.  Claretta Fraise 09/29/2022, 3:07 PM

## 2022-09-30 ENCOUNTER — Inpatient Hospital Stay: Payer: Medicare Other

## 2022-09-30 DIAGNOSIS — R579 Shock, unspecified: Secondary | ICD-10-CM | POA: Diagnosis not present

## 2022-09-30 DIAGNOSIS — K4030 Unilateral inguinal hernia, with obstruction, without gangrene, not specified as recurrent: Secondary | ICD-10-CM | POA: Diagnosis not present

## 2022-09-30 DIAGNOSIS — N179 Acute kidney failure, unspecified: Secondary | ICD-10-CM | POA: Diagnosis not present

## 2022-09-30 DIAGNOSIS — K55049 Acute infarction of large intestine, extent unspecified: Secondary | ICD-10-CM | POA: Diagnosis not present

## 2022-09-30 DIAGNOSIS — K922 Gastrointestinal hemorrhage, unspecified: Secondary | ICD-10-CM

## 2022-09-30 LAB — BASIC METABOLIC PANEL
Anion gap: 11 (ref 5–15)
BUN: 50 mg/dL — ABNORMAL HIGH (ref 8–23)
CO2: 22 mmol/L (ref 22–32)
Calcium: 7 mg/dL — ABNORMAL LOW (ref 8.9–10.3)
Chloride: 105 mmol/L (ref 98–111)
Creatinine, Ser: 1.39 mg/dL — ABNORMAL HIGH (ref 0.61–1.24)
GFR, Estimated: 51 mL/min — ABNORMAL LOW (ref >60)
Glucose, Bld: 160 mg/dL — ABNORMAL HIGH (ref 70–99)
Potassium: 3.7 mmol/L (ref 3.5–5.1)
Sodium: 138 mmol/L (ref 135–145)

## 2022-09-30 LAB — BPAM RBC
Blood Product Expiration Date: 202401312359
ISSUE DATE / TIME: 202401062333
Unit Type and Rh: 5100

## 2022-09-30 LAB — GLUCOSE, CAPILLARY
Glucose-Capillary: 109 mg/dL — ABNORMAL HIGH (ref 70–99)
Glucose-Capillary: 130 mg/dL — ABNORMAL HIGH (ref 70–99)
Glucose-Capillary: 135 mg/dL — ABNORMAL HIGH (ref 70–99)
Glucose-Capillary: 135 mg/dL — ABNORMAL HIGH (ref 70–99)
Glucose-Capillary: 146 mg/dL — ABNORMAL HIGH (ref 70–99)
Glucose-Capillary: 147 mg/dL — ABNORMAL HIGH (ref 70–99)
Glucose-Capillary: 154 mg/dL — ABNORMAL HIGH (ref 70–99)

## 2022-09-30 LAB — TRIGLYCERIDES: Triglycerides: 85 mg/dL (ref <150)

## 2022-09-30 LAB — PREPARE RBC (CROSSMATCH)

## 2022-09-30 LAB — CBC
HCT: 25.9 % — ABNORMAL LOW (ref 39.0–52.0)
Hemoglobin: 8.7 g/dL — ABNORMAL LOW (ref 13.0–17.0)
MCH: 29.9 pg (ref 26.0–34.0)
MCHC: 33.6 g/dL (ref 30.0–36.0)
MCV: 89 fL (ref 80.0–100.0)
Platelets: 122 10*3/uL — ABNORMAL LOW (ref 150–400)
RBC: 2.91 MIL/uL — ABNORMAL LOW (ref 4.22–5.81)
RDW: 14.1 % (ref 11.5–15.5)
WBC: 8.9 10*3/uL (ref 4.0–10.5)
nRBC: 0.2 % (ref 0.0–0.2)

## 2022-09-30 LAB — TYPE AND SCREEN
ABO/RH(D): O POS
Antibody Screen: NEGATIVE
Unit division: 0

## 2022-09-30 LAB — HEMOGLOBIN AND HEMATOCRIT, BLOOD
HCT: 23.6 % — ABNORMAL LOW (ref 39.0–52.0)
HCT: 22.2 % — ABNORMAL LOW (ref 39.0–52.0)
Hemoglobin: 8.1 g/dL — ABNORMAL LOW (ref 13.0–17.0)
Hemoglobin: 7.5 g/dL — ABNORMAL LOW (ref 13.0–17.0)

## 2022-09-30 LAB — MAGNESIUM: Magnesium: 2.1 mg/dL (ref 1.7–2.4)

## 2022-09-30 LAB — PHOSPHORUS: Phosphorus: 4.1 mg/dL (ref 2.5–4.6)

## 2022-09-30 LAB — LACTIC ACID, PLASMA
Lactic Acid, Venous: 2.6 mmol/L (ref 0.5–1.9)
Lactic Acid, Venous: 4 mmol/L (ref 0.5–1.9)

## 2022-09-30 MED ORDER — SODIUM CHLORIDE 0.9% IV SOLUTION: Freq: Once | INTRAVENOUS | Status: DC

## 2022-09-30 MED ORDER — SODIUM CHLORIDE 0.9% IV SOLUTION: Freq: Once | INTRAVENOUS | Status: AC

## 2022-09-30 MED ORDER — ACETAMINOPHEN 10 MG/ML IV SOLN
1000.0000 mg | Freq: Four times a day (QID) | INTRAVENOUS | Status: DC
  Administered 2022-09-30 – 2022-10-01 (×2): 1000 mg via INTRAVENOUS
  Filled 2022-09-30 (×5): qty 100

## 2022-09-30 MED ORDER — PHENYLEPHRINE CONCENTRATED 100MG/250ML (0.4 MG/ML) INFUSION SIMPLE
0.0000 ug/min | INTRAVENOUS | Status: DC
  Administered 2022-09-30: 20 ug/min via INTRAVENOUS
  Administered 2022-10-01: 250 ug/min via INTRAVENOUS
  Filled 2022-09-30 (×3): qty 250

## 2022-09-30 MED ORDER — SODIUM CHLORIDE 0.9 % IV SOLN
2.0000 g | Freq: Two times a day (BID) | INTRAVENOUS | Status: DC
  Administered 2022-10-01: 2 g via INTRAVENOUS
  Filled 2022-09-30: qty 12.5
  Filled 2022-09-30: qty 2

## 2022-09-30 NOTE — Consult Note (Signed)
[facility] GI Inpatient Consult Note   [facility], M.D.  Reason for Consult: postop bleeding through colostomy   Attending Requesting Consult: [facility], M.D.                                                       [facility], M.D.   History of Present Illness: [facility] is a 82 y.o. male who underwent an urgent sigmoid resection for presumed gangrenous ischemic sigmoid colitis on 10/02/2022 by [facility]. Patient reportedly had GIA stapling and removal of the ischemic area (Noted to be involved in an inguinal hernia defect), formation of Hartmann's pouch with formation of a descending colon colostomy. Surgical follow up documented by [facility] on 09/28/2022 revealed "Stoma now matured, with expected mucosal ischemic change". Everted portion of the colostomy (from my understanding of the documentation) appeared to show a small segment of discoloration and ischemic change but the serosa just proximal to that showed pink, viable mucosa. GI service has been called after surgical follow up by cross-coverage surgeon [facility] has noted, along with critical care team, increased bloody discharge from the ostomy with concurrent drop in Hgb requiring blood transfusion. WE are asked to consider endoluminal evaluation of the colostomy given lack of identifiable bleeding site on CTA of the abdomen.    Past Medical History:  Past Medical History:  Diagnosis Date   Anemia    Anxiety    Arthritis    CAD (coronary artery disease)    Cardiomyopathy ([place])    Carotid artery stenosis    high-grade symptomatic right sided s/p stent twice   CHF (congestive heart failure) (HCC)    chronic movement fluid and history of CHF he noticed in the    Depression    Fractures, multiple    Metal rod in left foot   GERD (gastroesophageal reflux disease)    Heart block    Hypercholesterolemia    Hyperglycemia    Hypertension    Hypothyroidism    Knee pain     Presence of permanent cardiac pacemaker    11/16/2015 Med[employer]onic ADR01,   (2008 St Jude)   Shortness of breath dyspnea    Stroke ([place])    clot in neck broke off left side weakness 2008  resolved   Syncope    second degree heart block s/p St Jude's pacemaker placement 12/08 with his history is limited with any   Ulcer of gastric fundus     Problem List: Patient Active Problem List   Diagnosis Date Noted   Malnutrition of moderate degree 09/28/2022   Shock circulatory ([place]) 10/19/2022   Gangrenous ischemic colitis ([place]) 10/05/2022   Incarcerated left inguinal hernia 10/14/2022   Sepsis ([place]) 10/07/2022   Lactic acidosis 09/29/2022   Memory change 08/27/2022   History of right common carotid artery stent placement 06/12/2021   Pacemaker 06/12/2021   Weakness 04/30/2020   Hyponatremia 05/10/2019   Dog bite 01/21/2019   Stress 05/05/2017   Weakness of left arm 11/09/2016   H/O total knee replacement 03/24/2016   S/P total knee arthroplasty 46/96/2952   Chronic systolic CHF (congestive heart failure) ([place]) 12/13/2015   HLD (hyperlipidemia) 12/13/2015   BP (high blood pressure) 12/13/2015   Cardiomyopathy, ischemic 12/13/2015   MI (mitral incompetence) 12/13/2015   Billowing mitral valve  12/13/2015   History of ventricular tachycardia 12/13/2015   Carotid stenosis 12/13/2015   Syncope and collapse 12/13/2015   Disease of thyroid gland 12/13/2015   Health care maintenance 09/04/2015   Encounter for completion of form with patient 01/12/2015   Left arm numbness 09/30/2014   Ischemia of upper extremity 09/30/2014   Pins and needles sensation 09/30/2014   Unspecified visual disturbance 09/30/2014   Paresthesia of arm 09/30/2014   Primary osteoarthritis of both knees 09/29/2014   History of cardiac catheterization 05/12/2014   Other specified postprocedural states 05/12/2014   Change in vision 01/09/2014   Knee pain, bilateral 01/09/2014   Arthralgia of lower leg 01/09/2014    Left knee pain 09/13/2013   Hyperglycemia 11/05/2012   Carotid artery stenosis 11/05/2012   Carotid artery narrowing 11/05/2012   Carotid artery obstruction 11/05/2012   Abnormal blood sugar 11/05/2012   Hypothyroidism 10/31/2012   Pure hypercholesterolemia 10/31/2012   Dysphagia, unspecified(787.20) 10/31/2012   Coronary atherosclerosis of native coronary artery 10/31/2012   Anemia 10/31/2012   Essential hypertension, benign 10/31/2012   Absolute anemia 10/31/2012   Benign essential HTN 10/31/2012   Adult hypothyroidism 10/31/2012    Past Surgical History: Past Surgical History:  Procedure Laterality Date   CARDIAC CATHETERIZATION     CATARACT EXTRACTION W/PHACO Right 03/07/2021   Procedure: CATARACT EXTRACTION PHACO AND INTRAOCULAR LENS PLACEMENT ([place]) RIGHT 8.69 00:55.2;  Surgeon: Birder Robson, MD;  Location: [place];  Service: Ophthalmology;  Laterality: Right;   COLONOSCOPY N/A 07/06/2021   Procedure: COLONOSCOPY;  Surgeon: Annamaria Helling, DO;  Location[facility] ENDOSCOPY;  Service: Gastroenterology;  Laterality: N/A;   CORONARY ANGIOPLASTY     stents x 3   heart stint  11/04/2014   [facility]   HERNIA REPAIR     inguinal   INSERT / REPLACE / REMOVE PACEMAKER     JOINT REPLACEMENT     right knee   KNEE ARTHROPLASTY Right 02/08/2016   Procedure: COMPUTER ASSISTED TOTAL KNEE ARTHROPLASTY;  Surgeon: Dereck Leep, MD;  Location: [facility] ORS;  Service: Orthopedics;  Laterality: Right;   KNEE ARTHROPLASTY Left 06/27/2016   Procedure: COMPUTER ASSISTED TOTAL KNEE ARTHROPLASTY;  Surgeon: Dereck Leep, MD;  Location: [facility] ORS;  Service: Orthopedics;  Laterality: Left;   LAPAROTOMY N/A 10/16/2022   Procedure: EXPLORATORY LAPAROTOMY; SIGMOID COLECTOMY WITH END COLOSTOMY CREATION;  Surgeon: Ronny Bacon, MD;  Location: [facility] ORS;  Service: General;  Laterality: N/A;   PACEMAKER INSERTION  08/2007   PACEMAKER INSERTION N/A 11/16/2015   Procedure: INSERTION  PACEMAKER/ PACEMAKER CHANGE OUT;  Surgeon: Isaias Cowman, MD;  Location: [facility] ORS;  Service: Cardiovascular;  Laterality: N/A;   PERCUTANEOUS PLACEMENT INTRAVASCULAR STENT CERVICAL CAROTID ARTERY     twice, right carotid artery    Allergies: Allergies  Allergen Reactions   Beta Adrenergic Blockers Other (See Comments)    Second Degree Heart Block   Lipitor [Atorvastatin] Other (See Comments)    Myalgia     Home Medications: Medications Prior to Admission  Medication Sig Dispense Refill Last Dose   amLODipine (NORVASC) 5 MG tablet TAKE 1 TABLET (5 MG TOTAL) BY MOUTH DAILY. 90 tablet 3 Past Week   Aspirin 81 MG EC tablet Take 81 mg by mouth daily.   Past Week   citalopram (CELEXA) 20 MG tablet TAKE 1 TABLET BY MOUTH EVERY DAY 90 tablet 2 Past Week   clopidogrel (PLAVIX) 75 MG tablet TAKE 1 TABLET BY MOUTH EVERY DAY 90 tablet 1 Past  Week   levothyroxine (SYNTHROID) 125 MCG tablet TAKE 1 TABLET BY MOUTH EVERY DAY BEFORE BREAKFAST 90 tablet 1 Past Week   lisinopril (ZESTRIL) 40 MG tablet TAKE 1 TABLET BY MOUTH EVERY DAY 90 tablet 1 Past Week   metoprolol tartrate (LOPRESSOR) 25 MG tablet TAKE 1 TABLET BY MOUTH TWICE A DAY 60 tablet 1 Past Week   pantoprazole (PROTONIX) 40 MG tablet TAKE 1 TABLET BY MOUTH EVERY DAY 90 tablet 2 Past Week   rosuvastatin (CRESTOR) 20 MG tablet TAKE 1 TABLET BY MOUTH EVERY DAY AT NIGHT 90 tablet 2 Past Week   acetaminophen (TYLENOL) 500 MG tablet Take 1,000 mg by mouth every 6 (six) hours as needed.   prn at prn   fexofenadine (ALLEGRA) 180 MG tablet Take 180 mg by mouth daily. As needed   prn at prn   LORazepam (ATIVAN) 1 MG tablet 1/2-1 TABLET AT BEDTIME AS NEEDED . 30 tablet 0 prn at prn   nitroGLYCERIN (NITROSTAT) 0.4 MG SL tablet Place 0.4 mg under the tongue every 5 (five) minutes as needed. If a third tablet is needed please call 911. (Patient not taking: Reported on 10/09/2022)   Not Taking   triamcinolone (NASACORT) 55 MCG/ACT AERO nasal inhaler Place  into the nose. (Patient not taking: Reported on 10/15/2022)   Not Taking   triamcinolone cream (KENALOG) 0.1 % Apply 1 application topically 2 (two) times daily. (Patient not taking: Reported on 10/16/2022) 30 g 0 Not Taking   Home medication reconciliation was completed with the patient.   Scheduled Inpatient Medications:    sodium chloride   Intravenous Once   Chlorhexidine Gluconate Cloth  6 each Topical Daily   citalopram  20 mg Per Tube Q2000   feeding supplement (PROSource TF20)  60 mL Per Tube Daily   free water  30 mL Per Tube Q4H   insulin aspart  0-15 Units Subcutaneous Q4H   levothyroxine  100 mcg Intravenous Daily   mouth rinse  15 mL Mouth Rinse 4 times per day   pantoprazole (PROTONIX) IV  40 mg Intravenous Q24H    Continuous Inpatient Infusions:    sodium chloride Stopped (09/29/22 2331)   acetaminophen 1,000 mg (09/30/22 0951)   ceFEPime (MAXIPIME) IV Stopped (09/30/22 0246)   feeding supplement (VITAL 1.5 CAL) Stopped (09/29/22 1030)   lactated ringers 75 mL/hr at 09/30/22 0621   metronidazole Stopped (09/29/22 2316)   norepinephrine (LEVOPHED) Adult infusion 27 mcg/min (09/30/22 0621)   vasopressin 0.04 Units/min (09/30/22 0621)    PRN Inpatient Medications:  acetaminophen, HYDROmorphone (DILAUDID) injection, morphine injection, mouth rinse  Family History: family history includes Cancer in his sister and sister; Colon polyps in his father; Diabetes in his sister; Heart disease in his father and mother; Thyroid disease in his father, sister, and sister.   GI Family History: Negative  Social History:   reports that he has never smoked. He has never used smokeless tobacco. He reports that he does not drink alcohol and does not use drugs. The patient denies ETOH, tobacco, or drug use.    Review of Systems: Review of Systems - Negative except HPI  Physical Examination: BP (!) 99/47   Pulse 81   Temp 98.1 F (36.7 C) (Oral)   Resp (!) 31   Ht 6' (1.829 m)    Wt 90.4 kg   SpO2 100%   BMI 27.03 kg/m  Physical Exam Vitals reviewed.  Constitutional:      General: He is not in acute  distress.    Appearance: Normal appearance. He is normal weight. He is ill-appearing.  HENT:     Head: Normocephalic and atraumatic.     Nose: Nose normal.  Eyes:     General: No scleral icterus.    Conjunctiva/sclera: Conjunctivae normal.     Pupils: Pupils are equal, round, and reactive to light.  Cardiovascular:     Rate and Rhythm: Normal rate.  Pulmonary:     Effort: No respiratory distress.     Breath sounds: No stridor.  Chest:     Chest wall: No tenderness.  Abdominal:     General: Bowel sounds are decreased.     Palpations: Abdomen is soft. There is no shifting dullness or mass.     Tenderness: There is generalized abdominal tenderness. There is no right CVA tenderness, left CVA tenderness or rebound.     Hernia: No hernia is present.    Skin:    Coloration: Skin is pale. Skin is not jaundiced.     Findings: No rash.  Neurological:     General: No focal deficit present.     Mental Status: He is alert.  Psychiatric:        Mood and Affect: Mood normal.        Thought Content: Thought content normal.        Judgment: Judgment normal.     Data: Lab Results  Component Value Date   WBC 8.9 09/30/2022   HGB 8.7 (L) 09/30/2022   HCT 25.9 (L) 09/30/2022   MCV 89.0 09/30/2022   PLT 122 (L) 09/30/2022   Recent Labs  Lab 09/29/22 1712 09/29/22 2223 09/30/22 0327  HGB 8.3* 7.9* 8.7*   Lab Results  Component Value Date   NA 138 09/30/2022   K 3.7 09/30/2022   CL 105 09/30/2022   CO2 22 09/30/2022   BUN 50 (H) 09/30/2022   CREATININE 1.39 (H) 09/30/2022   Lab Results  Component Value Date   ALT 41 09/29/2022   AST 146 (H) 09/29/2022   ALKPHOS 87 09/29/2022   BILITOT 1.2 09/29/2022   Recent Labs  Lab 09/25/2022 2111  INR 2.0*      Latest Ref Rng & Units 09/30/2022    3:27 AM 09/29/2022   10:23 PM 09/29/2022    5:12 PM  CBC   WBC 4.0 - 10.5 K/uL 8.9  8.7  7.4   Hemoglobin 13.0 - 17.0 g/dL 8.7  7.9  8.3   Hematocrit 39.0 - 52.0 % 25.9  24.1  24.2   Platelets 150 - 400 K/uL 122  123  119     STUDIES: ECHOCARDIOGRAM LIMITED  Result Date: 09/29/2022    ECHOCARDIOGRAM LIMITED REPORT   Patient Name:   [facility] Date of Exam: 09/29/2022 Medical Rec #:  [id number]        Height:       72.0 in Accession #:    [id number]       Weight:       198.6 lb Date of Birth:  [birth year]        BSA:          2.124 m Patient Age:    58 years         BP:           100/62 mmHg Patient Gender: M                HR:           123  bpm. Exam Location:  [facility] Procedure: Limited Echo STAT ECHO Indications:     Pericardial effusion I31.3  History:         Patient has prior history of Echocardiogram examinations, most                  recent 09/27/2022.  Sonographer:     Kathlen Brunswick RDCS Referring Phys:  [id number] [place] Diagnosing Phys: Neoma Laming  Sonographer Comments: Technically challenging study due to limited acoustic windows, suboptimal subcostal window, suboptimal parasternal window and suboptimal apical window. Image acquisition challenging due to patient body habitus. IMPRESSIONS  1. Left ventricular ejection fraction, by estimation, is 60 to 65%. The left ventricle has normal function. The left ventricle has no regional wall motion abnormalities.  2. Right ventricular systolic function is normal. The right ventricular size is normal.  3. Left atrial size was moderately dilated.  4. Right atrial size was moderately dilated.  5. A small pericardial effusion is present. The pericardial effusion is posterior to the left ventricle and anterior to the right ventricle. There is no evidence of cardiac tamponade.  6. The mitral valve is normal in structure. No evidence of mitral valve regurgitation. No evidence of mitral stenosis.  7. The aortic valve is normal in structure. Aortic valve regurgitation is not visualized. No aortic stenosis is  present.  8. The inferior vena cava is normal in size with greater than 50% respiratory variability, suggesting right atrial pressure of 3 mmHg. FINDINGS  Left Ventricle: Left ventricular ejection fraction, by estimation, is 60 to 65%. The left ventricle has normal function. The left ventricle has no regional wall motion abnormalities. The left ventricular internal cavity size was normal in size. There is  no concentric left ventricular hypertrophy. Right Ventricle: The right ventricular size is normal. No increase in right ventricular wall thickness. Right ventricular systolic function is normal. Left Atrium: Left atrial size was moderately dilated. Right Atrium: Right atrial size was moderately dilated. Pericardium: A small pericardial effusion is present. The pericardial effusion is posterior to the left ventricle and anterior to the right ventricle. There is excessive respiratory variation in the mitral valve spectral Doppler velocities and excessive respiratory variation in the tricuspid valve spectral Doppler velocities. There is no evidence of cardiac tamponade. Mitral Valve: The mitral valve is normal in structure. No evidence of mitral valve stenosis. Tricuspid Valve: The tricuspid valve is normal in structure. Tricuspid valve regurgitation is not demonstrated. No evidence of tricuspid stenosis. Aortic Valve: The aortic valve is normal in structure. Aortic valve regurgitation is not visualized. No aortic stenosis is present. Pulmonic Valve: The pulmonic valve was normal in structure. Pulmonic valve regurgitation is not visualized. No evidence of pulmonic stenosis. Aorta: The aortic root is normal in size and structure. Venous: The inferior vena cava is normal in size with greater than 50% respiratory variability, suggesting right atrial pressure of 3 mmHg. IAS/Shunts: No atrial level shunt detected by color flow Doppler. Neoma Laming Electronically signed by Neoma Laming Signature Date/Time:  09/29/2022/3:34:33 PM    Final    CT Angio Abd/Pel w/ and/or w/o  Result Date: 09/29/2022 CLINICAL DATA:  Retroperitoneal and stomal hemorrhage. Ischemic enteritis/colitis EXAM: CTA ABDOMEN AND PELVIS WITHOUT AND WITH CONTRAST TECHNIQUE: Multidetector CT imaging of the abdomen and pelvis was performed using the standard protocol during bolus administration of intravenous contrast. Multiplanar reconstructed images and MIPs were obtained and reviewed to evaluate the vascular anatomy. RADIATION DOSE REDUCTION: This exam was performed according to the  departmental dose-optimization program which includes automated exposure control, adjustment of the mA and/or kV according to patient size and/or use of iterative reconstruction technique. CONTRAST:  167m OMNIPAQUE IOHEXOL 350 MG/ML SOLN COMPARISON:  10/20/2022 FINDINGS: VASCULAR Aorta: Infrarenal aneurysmal dissection of the abdominal aorta is again identified with stable caliber of the abdominal aorta measuring 4.4 x 3.8 cm on coronal image # 70/13 and sagittal image # 115/14. Small amount of mural thrombus noted within the aneurysmal segment, unchanged. No periaortic fluid collections identified to suggest rupture. Superimposed extensive aortoiliac atherosclerotic calcification. Celiac: Patent without evidence of aneurysm, dissection, vasculitis or significant stenosis. SMA: Patent without evidence of aneurysm, dissection, vasculitis or significant stenosis. Renals: Single renal arteries bilaterally demonstrate normal vascular morphology and wide patency. No aneurysm or dissection. IMA: Near occlusion at the origin. Distally patent supplying the left colic artery. Multiple high-grade loculated collections are seen within the sigmoid mesentery likely representing small mesenteric hematoma related to sigmoid resection. Inflow: 2.4 cm right common iliac artery aneurysm and 2.4 cm left internal iliac artery aneurysm are unchanged. No evidence of hemodynamically  significant stenosis. Proximal Outflow: Bilateral common femoral and visualized portions of the superficial and profunda femoral arteries are patent without evidence of aneurysm, dissection, vasculitis or significant stenosis. Veins: No obvious venous abnormality within the limitations of this arterial phase study. Review of the MIP images confirms the above findings. NON-VASCULAR Lower chest: Small bilateral pleural effusions developed with bibasilar dependent atelectasis. Pacemaker leads noted within the right ventricle. Extensive right coronary artery calcification. Cardiac size within normal limits. Trace pericardial fluid is stable. Hepatobiliary: No focal liver abnormality is seen. No gallstones, gallbladder wall thickening, or biliary dilatation. Pancreas: Unremarkable Spleen: Unremarkable Adrenals/Urinary Tract: The adrenal glands are unremarkable. The kidneys are normal in size and position. Mild right hydronephrosis has developed. No hydronephrosis on the left. No intrarenal or ureteral calculi are identified. The bladder is decompressed with a Foley catheter balloon seen within its lumen. Marked circumferential bladder wall thickening has developed suggesting superimposed infectious or inflammatory cystitis. The bladder, however, is not distended. Stomach/Bowel: Surgical changes of descending colostomy and Hartmann pouch formation are identified. There is small free intraperitoneal gas and fluid present, likely postsurgical in nature. As noted previously, several high attenuation collections are seen within the sigmoid mesentery adjacent to the terminal portion of the inferior mesenteric artery likely reflecting small mesenteric hematoma related to sigmoid resection. No active extravasation identified. Previously noted prominent small bowel wall thickening has improved in the interval. The small bowel appears diffusely fluid-filled, however, suggesting changes of an underlying adynamic ileus. Descending  colon demonstrates mild circumferential wall thickening, possibly reflecting postsurgical edema. The stomach, small bowel, and large bowel are otherwise unremarkable. The appendix is unremarkable. Lymphatic: No pathologic adenopathy within the abdomen and pelvis. Reproductive: Prostate gland is unremarkable. Prominent scrotal edema noted. Calcifications within the left inguinal canal may be post infectious or post inflammatory in nature. Other: Left inguinal hernia has been reduced in the interval. Moderate diffuse body wall and presacral edema is present, in keeping with developing anasarca. Musculoskeletal: No acute bone abnormality. No lytic or blastic bone lesion. Osseous structures are diffusely osteopenic. IMPRESSION: 1. Stable 4.4 cm infrarenal abdominal aortic aneurysm. Recommend follow-up every 12 months and vascular consultation. This recommendation follows ACR consensus guidelines: White Paper of the ACR Incidental Findings Committee II on Vascular Findings. J Am Coll Radiol 2013; 10:789-794. 2. Stable 2.4 cm right common iliac artery and 2.4 cm left internal iliac artery aneurysms. 3. Near occlusion  of the inferior mesenteric artery at the origin. Distally patent supplying the left colic artery. Circumferential thickening of the terminal descending colon may simply represent postsurgical edema but is nonspecific. 4. Surgical changes of descending colostomy and Hartmann pouch formation with small free intraperitoneal gas and fluid, likely postsurgical in nature. Multiple high-grade loculated collections within the sigmoid mesentery adjacent to the terminal portion of the inferior mesenteric artery likely reflecting small mesenteric hematoma related to sigmoid resection. No active extravasation identified. 5. Previously noted prominent small bowel wall thickening has improved in the interval. The small bowel appears diffusely fluid-filled, however, suggesting changes of an underlying adynamic ileus. 6.  Marked circumferential bladder wall thickening suggesting superimposed infectious or inflammatory cystitis. Correlation with urinalysis and urine culture may be helpful. 7. Interval development of small bilateral pleural effusions, bibasilar atelectasis, and developing anasarca with diffuse body wall and presacral edema. Aortic aneurysm NOS (ICD10-I71.9). Aortic Atherosclerosis (ICD10-I70.0). Electronically Signed   By: Fidela Salisbury M.D.   On: 09/29/2022 03:41   '@IMAGES'$ @  Assessment:  Bleeding from Colostomy - Ddx includes ischemia of the ostomy, colonic diverticulosis, ischemic colitis proximal to the site of resection, infectious colitis, UGI source of bleeding (PUD, dieuloafoy lesion, etc.). Given appearance of blood, would still consider site of bleeding at or just proximal to the ostomy itself.  Anemia secondary to GI blood loss. Responding well hemodynamically to blood transfusion. Hypotension on vasopressors - possibly multifactorial (Cardiac tamponade, Gi blood loss, sepsis, CHF, etc.). Has continual need of vasopressor support (on vasopressin and levophed). HX of Gastric ulcer. HFrEF  Recommendations: Hemodynamic instability from multiple factors appears to prevent endoscopy through colostomy at this time. Therefore, I advise continued acid suppression, transfusions as needed, serial exams, serial H/H. Unprepped endoscopy through stoma when clinically feasible. Continue addressing treatable causes of hypotension. Will follow expectantly for now.   Thank you for the consult. Please call with questions or concerns.  Olean Ree, "Lanny Hurst MD [facility] [place], [place] [address] [phone]  09/30/2022 10:47 AM

## 2022-09-30 NOTE — Progress Notes (Signed)
Patient ID: Daniel Austin, male   DOB: 25-Nov-1940, 82 y.o.   MRN: [id number]     [facility] [facility] Day(s): 4.   Interval History: Patient seen and examined.  Patient continues critically ill.  He continues on pressor for blood pressure support.  He is unable to say any complaints.  He is alert.  Continue having bloody output through colostomy.  Colostomy output was 1,090 liters in last 24 hours.  Vital signs in last 24 hours: [min-max] current  Temp:  [98.1 F (36.7 C)-98.3 F (36.8 C)] 98.1 F (36.7 C) (01/07 0400) Pulse Rate:  [25-92] 81 (01/07 0650) Resp:  [17-38] 31 (01/07 0650) BP: (75-156)/(25-123) 99/47 (01/07 0650) SpO2:  [98 %-100 %] 100 % (01/07 0650) Arterial Line BP: (62-132)/(36-56) 113/36 (01/06 1800) FiO2 (%):  [28 %] 28 % (01/07 0615) Weight:  [90.4 kg] 90.4 kg (01/07 0315)     Height: 6' (182.9 cm) Weight: 90.4 kg BMI (Calculated): 27.02   Physical Exam:  Constitutional: alert, cooperative and no distress  Respiratory: breathing non-labored at rest  Cardiovascular: regular rate and sinus rhythm  Gastrointestinal: soft, non-tender, and non-distended.  Colostomy with ischemic mucosa.  It was suspected as per primary surgeon.  Labs:     Latest Ref Rng & Units 09/30/2022    3:27 AM 09/29/2022   10:23 PM 09/29/2022    5:12 PM  CBC  WBC 4.0 - 10.5 K/uL 8.9  8.7  7.4   Hemoglobin 13.0 - 17.0 g/dL 8.7  7.9  8.3   Hematocrit 39.0 - 52.0 % 25.9  24.1  24.2   Platelets 150 - 400 K/uL 122  123  119       Latest Ref Rng & Units 09/30/2022    3:27 AM 09/29/2022   10:23 PM 09/29/2022    1:47 PM  CMP  Glucose 70 - 99 mg/dL 160  150  161   BUN 8 - 23 mg/dL 50  47  40   Creatinine 0.61 - 1.24 mg/dL 1.39  1.40  1.19   Sodium 135 - 145 mmol/L 138  138  135   Potassium 3.5 - 5.1 mmol/L 3.7  3.9  3.9   Chloride 98 - 111 mmol/L 105  103  105   CO2 22 - 32 mmol/L '22  23  22   '$ Calcium 8.9 - 10.3 mg/dL 7.0  7.2  7.1   Total Protein 6.5 - 8.1 g/dL  5.0    Total  Bilirubin 0.3 - 1.2 mg/dL  1.2    Alkaline Phos 38 - 126 U/L  87    AST 15 - 41 U/L  146    ALT 0 - 44 U/L  41      Imaging studies: CT angio of the abdomen negative for contracted subluxation or any concern of active bleeding.  I personally reviewed the images   Assessment/Plan:  83 y.o. male with ischemic sigmoid colitis 4 Days Post-Op s/p Hartman's procedure.   -Patient recovering slowly but adequately -Stable vital signs but on vasopressors -Hemoglobin dropped to 7.9 and increased after transfusion to 8.7 -Colostomy with bloody output -This is concerning for lower GI bleeding.  CTA was negative for active bleeding source.  Consider bleeding scan versus direct visualization with upper and/or lower endoscopy -Continue medical support -Agree with keeping patient n.p.o. until solution of the GI bleeding   Arnold Long, MD

## 2022-09-30 NOTE — Consult Note (Signed)
[facility] for Electrolyte Monitoring and Replacement   Recent Labs: Potassium (mmol/L)  Date Value  09/30/2022 3.7   Magnesium (mg/dL)  Date Value  09/30/2022 2.1   Calcium (mg/dL)  Date Value  09/30/2022 7.0 (L)   Albumin (g/dL)  Date Value  09/29/2022 2.5 (L)   Phosphorus (mg/dL)  Date Value  09/30/2022 4.1   Sodium (mmol/L)  Date Value  09/30/2022 138   Corrected Ca: 8.2 mg/dL  Assessment  82 y.o. male presenting with sepsis. PMH significant for anemia, anxiety, depression, CAD, ICM s/p PCI, HFrEF (2018 EF 45%), CVA, HTN, HLD, GERD, hypothyroidism. Pharmacy has been consulted to monitor and replace electrolytes.  Diet: Vital 1.5'@50ml'$ /hr + free water flushes 26m q4 hours   MIVF: lactated ringers at 75 mL/hr  Goal of Therapy: Electrolytes WNL  Plan:  No electrolyte replacement warranted for today Recheck electrolytes in am  Thank you for allowing pharmacy to be a part of this patient's care.  RVallery Sa PharmD, BCPS 09/30/2022 7:18 AM

## 2022-09-30 NOTE — Progress Notes (Signed)
NAME:  Daniel Austin, MRN:  [id number], DOB:  July 16, 1941, LOS: 4 ADMISSION DATE:  10/08/2022, CHIEF COMPLAINT:  Ischemic colitis   History of Present Illness:   82 yo M presenting to [facility] ED from home via EMS on 10/07/2022 with a chief complaint of sepsis alert. History provided by daughter and spouse bedside as patient is altered. Patient was in his normal state of health until he took a laxative dose 2 days ago then had uncontrollable diarrhea.  Spouse also described nausea with dry heaves and some mild emesis with poor p.o. intake. She denied fever, chest pain & shortness of breath.  They reported he fell and when the daughter arrived to assist her mother he appeared tremulous and diaphoretic. Family denies EtOH use/ NSAID use. EMS reported being called out due to nausea/vomiting and diarrhea, and the patient had a syncopal episode when first responders stood him up.  EMS reported hypotension in the field patient received a liter NS bolus and was given 4 mg of IV Zofran prior to arrival.   ED course: Upon arrival patient confused and severely hypotensive with tachypnea.  Sepsis protocol initiated.  Labs significant for hyponatremia (possible acute on chronic), hyperglycemia, AKI, AGMA, transaminitis, leukocytosis with lactic acidosis and elevated troponin.  Imaging suspicious for UTI, also revealing severe gastritis.  Post total 2.5 L IV fluid resuscitation between EMS and ED patient remained hypotensive requiring low-dose peripheral vasopressor support. ABG: 7.30/ 22/67/10.8   CXR 10/09/2022: Low lung volumes without active cardiopulmonary disease CT head without contrast 10/03/2022: No acute intracranial abnormality.  Chronic right frontal parietal and right parietoccipital infarcts.  Generalized cerebral atrophy and microvascular disease changes of the supratentorial brain. CT angio chest PE 10/23/2022: No evidence of pulmonary embolism or other acute intrathoracic process.  Marked severity coronary  artery calcification, small anterior pericardial effusion. CT abdomen and pelvis with contrast 10/19/2022: Findings consistent with marked severity gastritis and/or duodenitis.  Mild to moderate severity diffuse enteritis.  Large amount of stool within the mid to distal sigmoid colon and rectum consistent with constipation.  Sigmoid diverticulosis, 3.8 cm x 3.7 cm x 3.8 cm infrarenal AAA.  7 cm x 4.8 cm left inguinal hernia.  Mild diffuse urinary bladder wall thickening which may represent cystitis.    PCCM consulted for admission due to circulatory shock secondary to hypovolemia and suspected sepsis.  Pertinent  Medical History  Anemia Anxiety & depression CAD ICM status post PCI HFrEF (echo 2018 LVEF 45%) CVA Heart block status post PPM 12/08 Hypertension Hypercholesterolemia GERD Hypothyroidism Gastric ulcer status post-repair  Significant Hospital Events: Including procedures, antibiotic start and stop dates in addition to other pertinent events   10/14/2022: Admit to ICU with circulatory shock secondary to hypovolemia and sepsis POA in the setting of gastritis and possible UTI requiring peripheral vasopressor support. 1/3: Ischemic sigmoid colitis, likely complicated secondary to sliding left inguinal hernia. EXPLORATORY LAPAROTOMY; SIGMOID COLECTOMY WITH END COLOSTOMY  CREATION (Abdomen)  1/4: Extubated 1/5: Awake, comfortable in bed. Blood from the stoma noted 1/6: more blood from the stoma overnight, CTA of the abdomen reassuring  Interim History / Subjective:  Patient awake today, reporting pain in the abdomen. He is more interactive.  Objective   Blood pressure (!) 90/50, pulse 84, temperature 98.8 F (37.1 C), temperature source Oral, resp. rate (!) 38, height 6' (1.829 m), weight 90.4 kg, SpO2 99 %.    FiO2 (%):  [28 %] 28 %   Intake/Output Summary (Last 24 hours) at 09/30/2022  [place] filed at 09/30/2022 8546 Gross per 24 hour  Intake 3295.2 ml  Output 1470 ml   Net 1825.2 ml    Filed Weights   09/28/22 0500 09/29/22 0433 09/30/22 0315  Weight: 90.7 kg 90.1 kg 90.4 kg    Examination: Physical Exam Constitutional:      General: He is not in acute distress.    Appearance: He is ill-appearing.  HENT:     Head: Normocephalic.     Mouth/Throat:     Mouth: Mucous membranes are moist.  Eyes:     Extraocular Movements: Extraocular movements intact.  Cardiovascular:     Rate and Rhythm: Normal rate and regular rhythm.     Pulses: Normal pulses.     Heart sounds: Normal heart sounds.  Pulmonary:     Effort: Pulmonary effort is normal.     Breath sounds: Normal breath sounds.  Abdominal:     Palpations: Abdomen is soft.     Comments: Clean incision site, bloody output from the ostomy.  Musculoskeletal:        General: Normal range of motion.  Skin:    General: Skin is warm.  Neurological:     General: No focal deficit present.     Mental Status: He is alert.     Assessment & Plan:   Neurology #Toxic Metabolic Encephalopathy  Improved. Avoid sedating medications.  Cardiovascular #Distributive Shock #Demand Ischemia #History of CAD (DES mid LAD)  Developed distributive shock secondary to a combination of ischemic bowel, possible peritonitis, Gi bleed, and fluid shifts following colonic resection. Patient had a TTE that shows low normal EF, with no notable hypokinesis. TTE with pericardial effusion previously noted on CT chest. Cardiology consulted and are following, they are not concerned about signs of tamponade. No signs of pulsus paradoxus or JV distention on physical exam.  -goal MAP of 65 mmHg -cardiology consult appreciated -continue nor-epinephrine and vasopressin  Pulmonary Extubated successfully, currently on room air with no active issues  Gastrointestinal #Ischemic Colitis  Patient is s/p Hartman's procedure secondary to ischemic colitis from a left inguinal hernia. His stoma is now matured with mucosal ischemic  changes that surgery is closely following. Does have blood through the stoma with stable hemoglobin. Repeat CTA of the abdomen is re-assuring, thou does have embolus in the IMA, unclear if new or old. Patient could have had ischemic colitis from an embolic phenomenon contribute to his presentation. Will consult GI today for the direct evaluation of his intestinal mucosa.  -NPO -PPI for prophylaxis -GI consult today  Renal #AKI #Metabolic acidosis #Hypokalemia  AKI on presentation has improved following the administration of sodium bicarbonate. Repeat creatinine at 1.39 today.  -bid BMP -avoid nephrotoxins -LR at 75 ml/hour  Endocrine #Hypothyroidism  Insulin sliding scale for glycemic control. Continue home levothyroxine.  Hem/Onc  Lovenox for DVT prophylaxis, holding with blood from the stoma. Continue to monitor H/H and platelets. Will administer another unit of PRBC today.  ID #Ischemic Colitis  Broad spectrum antibiotics given concern of infection secondary to perforated bowel (egress of discolored fluid from the peritoneal cavity noted in the operative report).  -Continue Cefepime and Metronidazole  Best Practice (right click and "Reselect all SmartList Selections" daily)   Diet/type: tubefeeds DVT prophylaxis: other (holding LMWH due to bleed) GI prophylaxis: PPI Lines: Central line and yes and it is still needed Foley:  Yes, and it is still needed Code Status:  full code Last date of multidisciplinary goals of care discussion [  09/30/2022]  Labs   CBC: Recent Labs  Lab 09/29/22 0117 09/29/22 1054 09/29/22 1347 09/29/22 1712 09/29/22 2223 09/30/22 0327  WBC 9.2 7.5 7.7 7.4 8.7 8.9  NEUTROABS 8.0* 6.0 6.1 5.6 6.3  --   HGB 10.6* 9.1* 8.8* 8.3* 7.9* 8.7*  HCT 30.3* 26.4* 26.1* 24.2* 24.1* 25.9*  MCV 87.3 86.8 87.9 86.7 90.3 89.0  PLT 150 122* 129* 119* 123* 122*     Basic Metabolic Panel: Recent Labs  Lab 09/27/22 0437 09/28/22 0438 09/28/22 1617  09/28/22 2317 09/29/22 0117 09/29/22 1347 09/29/22 2223 09/30/22 0327  NA 133* 132*  --   --  134* 135 138 138  K 3.9 2.8*   < > 3.5 3.7 3.9 3.9 3.7  CL 101 97*  --   --  102 105 103 105  CO2 20* 25  --   --  '24 22 23 22  '$ GLUCOSE 127* 139*  --   --  135* 161* 150* 160*  BUN 38* 36*  --   --  35* 40* 47* 50*  CREATININE 1.46* 1.12  --   --  1.06 1.19 1.40* 1.39*  CALCIUM 7.3* 6.9*  --   --  7.4* 7.1* 7.2* 7.0*  MG 2.5* 2.4  --   --  2.3 2.2  --  2.1  PHOS 5.2* 2.8  --   --  1.7* 2.4*  --  4.1   < > = values in this interval not displayed.    GFR: Estimated Creatinine Clearance: 45 mL/min (A) (by C-G formula based on SCr of 1.39 mg/dL (H)). Recent Labs  Lab 10/16/2022 0101 10/16/2022 0313 09/27/22 0437 09/28/22 0438 09/28/22 1325 09/29/22 0117 09/29/22 1054 09/29/22 1347 09/29/22 1712 09/29/22 2223 09/30/22 0327 09/30/22 0819  PROCALCITON <0.10  --  23.82 19.94  --   --   --   --   --   --   --   --   WBC 17.3*   < > 11.2* 8.3   < > 9.2   < > 7.7 7.4 8.7 8.9  --   LATICACIDVEN 5.3*   < > 3.1* 2.2*   < > 1.9  --  2.1* 1.9  --   --  2.6*   < > = values in this interval not displayed.     Liver Function Tests: Recent Labs  Lab 09/29/2022 0101 10/17/2022 1959 09/27/22 0437 09/29/22 2223  AST 42* 66* 68* 146*  ALT '13 15 17 '$ 41  ALKPHOS 70 46 38 87  BILITOT 1.2 1.1 1.2 1.2  PROT 6.6 5.4* 5.4* 5.0*  ALBUMIN 3.9 3.2* 3.2* 2.5*    Recent Labs  Lab 10/10/2022 0101  LIPASE 61*    No results for input(s): "AMMONIA" in the last 168 hours.  ABG    Component Value Date/Time   PHART 7.46 (H) 09/27/2022 0838   PCO2ART 30 (L) 09/27/2022 0838   PO2ART 101 09/27/2022 0838   HCO3 24.1 09/29/2022 2224   ACIDBASEDEF 0.3 09/29/2022 2224   O2SAT 45.8 09/29/2022 2224     Coagulation Profile: Recent Labs  Lab 10/16/2022 2111  INR 2.0*     Cardiac Enzymes: No results for input(s): "CKTOTAL", "CKMB", "CKMBINDEX", "TROPONINI" in the last 168 hours.  HbA1C: Hgb A1c MFr Bld   Date/Time Value Ref Range Status  08/29/2022 10:16 AM 6.1 4.6 - 6.5 % Final    Comment:    Glycemic Control Guidelines for People with Diabetes:Non Diabetic:  <6%Goal of Therapy: <7%Additional Action  Suggested:  >8%   03/14/2021 11:51 AM 6.1 4.6 - 6.5 % Final    Comment:    Glycemic Control Guidelines for People with Diabetes:Non Diabetic:  <6%Goal of Therapy: <7%Additional Action Suggested:  >8%     CBG: Recent Labs  Lab 09/29/22 1956 09/30/22 0012 09/30/22 0326 09/30/22 0747 09/30/22 1130  GLUCAP 125* 135* 154* 146* 147*      Past Medical History:  He,  has a past medical history of Anemia, Anxiety, Arthritis, CAD (coronary artery disease), Cardiomyopathy ([place]), Carotid artery stenosis, CHF (congestive heart failure) ([place]), Depression, Fractures, multiple, GERD (gastroesophageal reflux disease), Heart block, Hypercholesterolemia, Hyperglycemia, Hypertension, Hypothyroidism, Knee pain, Presence of permanent cardiac pacemaker, Shortness of breath dyspnea, Stroke ([place]), Syncope, and Ulcer of gastric fundus.   Surgical History:   Past Surgical History:  Procedure Laterality Date   CARDIAC CATHETERIZATION     CATARACT EXTRACTION W/PHACO Right 03/07/2021   Procedure: CATARACT EXTRACTION PHACO AND INTRAOCULAR LENS PLACEMENT (IOC) RIGHT 8.69 00:55.2;  Surgeon: Birder Robson, MD;  Location: [facility] [place];  Service: Ophthalmology;  Laterality: Right;   COLONOSCOPY N/A 07/06/2021   Procedure: COLONOSCOPY;  Surgeon: Annamaria Helling, DO;  Location: [facility] ENDOSCOPY;  Service: Gastroenterology;  Laterality: N/A;   CORONARY ANGIOPLASTY     stents x 3   heart stint  11/04/2014   [facility]   HERNIA REPAIR     inguinal   INSERT / REPLACE / REMOVE PACEMAKER     JOINT REPLACEMENT     right knee   KNEE ARTHROPLASTY Right 02/08/2016   Procedure: COMPUTER ASSISTED TOTAL KNEE ARTHROPLASTY;  Surgeon: Dereck Leep, MD;  Location: [facility] ORS;  Service: Orthopedics;  Laterality:  Right;   KNEE ARTHROPLASTY Left 06/27/2016   Procedure: COMPUTER ASSISTED TOTAL KNEE ARTHROPLASTY;  Surgeon: Dereck Leep, MD;  Location: [facility];  Service: Orthopedics;  Laterality: Left;   LAPAROTOMY N/A 10/14/2022   Procedure: EXPLORATORY LAPAROTOMY; SIGMOID COLECTOMY WITH END COLOSTOMY CREATION;  Surgeon: Ronny Bacon, MD;  Location: [facility];  Service: General;  Laterality: N/A;   PACEMAKER INSERTION  08/2007   PACEMAKER INSERTION N/A 11/16/2015   Procedure: INSERTION PACEMAKER/ PACEMAKER CHANGE OUT;  Surgeon: Isaias Cowman, MD;  Location: [facility] OR[facility];  Service: Cardiovascular;  Laterality: N/A;   PERCUTANEOUS PLACEMENT INTRAVASCULAR STENT CERVICAL CAROTID ARTERY     twice, right carotid artery     Social History:   reports that he has never smoked. He has never used smokeless tobacco. He reports that he does not drink alcohol and does not use drugs.   Family History:  His family history includes Cancer in his sister and sister; Colon polyps in his father; Diabetes in his sister; Heart disease in his father and mother; Thyroid disease in his father, sister, and sister. There is no history of Colon cancer or Prostate cancer.   Allergies Allergies  Allergen Reactions   Beta Adrenergic Blockers Other (See Comments)    Second Degree Heart Block   Lipitor [Atorvastatin] Other (See Comments)    Myalgia      Home Medications  Prior to Admission medications   Medication Sig Start Date End Date Taking? Authorizing Provider  amLODipine (NORVASC) 5 MG tablet TAKE 1 TABLET (5 MG TOTAL) BY MOUTH DAILY. 08/28/21  Yes Einar Pheasant, MD  Aspirin 81 MG EC tablet Take 81 mg by mouth daily.   Yes [provider]  citalopram (CELEXA) 20 MG tablet TAKE 1 TABLET BY MOUTH EVERY DAY 05/31/22  Yes Einar Pheasant,  MD  clopidogrel (PLAVIX) 75 MG tablet TAKE 1 TABLET BY MOUTH EVERY DAY 07/12/22  Yes Einar Pheasant, MD  levothyroxine (SYNTHROID) 125 MCG tablet TAKE 1 TABLET BY MOUTH EVERY  DAY BEFORE BREAKFAST 02/26/22  Yes Einar Pheasant, MD  lisinopril (ZESTRIL) 40 MG tablet TAKE 1 TABLET BY MOUTH EVERY DAY 03/19/22  Yes Einar Pheasant, MD  metoprolol tartrate (LOPRESSOR) 25 MG tablet TAKE 1 TABLET BY MOUTH TWICE A DAY 08/27/22  Yes Einar Pheasant, MD  pantoprazole (PROTONIX) 40 MG tablet TAKE 1 TABLET BY MOUTH EVERY DAY 05/04/22  Yes Einar Pheasant, MD  rosuvastatin (CRESTOR) 20 MG tablet TAKE 1 TABLET BY MOUTH EVERY DAY AT NIGHT 05/04/22  Yes Einar Pheasant, MD  acetaminophen (TYLENOL) 500 MG tablet Take 1,000 mg by mouth every 6 (six) hours as needed.    [provider]  fexofenadine (ALLEGRA) 180 MG tablet Take 180 mg by mouth daily. As needed    [provider]  LORazepam (ATIVAN) 1 MG tablet 1/2-1 TABLET AT BEDTIME AS NEEDED . 09/11/22   Einar Pheasant, MD  nitroGLYCERIN (NITROSTAT) 0.4 MG SL tablet Place 0.4 mg under the tongue every 5 (five) minutes as needed. If a third tablet is needed please call 911. Patient not taking: Reported on 10/03/2022    [provider]  triamcinolone (NASACORT) 55 MCG/ACT AERO nasal inhaler Place into the nose. Patient not taking: Reported on 10/14/2022 08/28/18   [provider]  triamcinolone cream (KENALOG) 0.1 % Apply 1 application topically 2 (two) times daily. Patient not taking: Reported on 10/07/2022 09/25/17   Einar Pheasant, MD     Critical care time: 60 minutes    Armando Reichert, MD [facility] 09/30/2022 12:32 PM

## 2022-09-30 NOTE — Progress Notes (Signed)
SUBJECTIVE: Patient is an 82 year old male with past medical history significant for CAD, cardiomyopathy, anemia, anxiety, arthritis, carotid artery stenosis, CHF, GERD, HLD, HTN, hypothyroidism, PPM implant, CVA.    Patient presented to ED on 10/24/2022 via EMS with chief complaint of nausea, vomiting, and diarrhea. Patient had syncopal episode with EMS attempted to stand him up. Patient found to be hypotensive, suspected sepsis.    Patient underwent exploratory laparotomy, sigmoid resection with end colostomy for ischemic sigmoid colitis on 10/21/2022.   Echo on 09/27/22 EF 45-50%, grade I DD, moderate size pericardial effusion.   Limited echo yesterday revealed EF 60-65%, small pericardial effusion with no evidence of tamponade.     Vitals:   09/30/22 1145 09/30/22 1200 09/30/22 1217 09/30/22 1300  BP: (!) 166/137 (!) 156/129 (!) 90/50 (!) 96/40  Pulse: 81 79 84   Resp: (!) 34 (!) 33 (!) 38 (!) 35  Temp: 98.2 F (36.8 C)  98.8 F (37.1 C)   TempSrc: Oral  Oral   SpO2: 100% 100% 99%   Weight:      Height:        Intake/Output Summary (Last 24 hours) at 09/30/2022 1320 Last data filed at 09/30/2022 1301 Gross per 24 hour  Intake 4246.93 ml  Output 1745 ml  Net 2501.93 ml    LABS: Basic Metabolic Panel: Recent Labs    09/29/22 1347 09/29/22 2223 09/30/22 0327  NA 135 138 138  K 3.9 3.9 3.7  CL 105 103 105  CO2 '22 23 22  '$ GLUCOSE 161* 150* 160*  BUN 40* 47* 50*  CREATININE 1.19 1.40* 1.39*  CALCIUM 7.1* 7.2* 7.0*  MG 2.2  --  2.1  PHOS 2.4*  --  4.1   Liver Function Tests: Recent Labs    09/29/22 2223  AST 146*  ALT 41  ALKPHOS 87  BILITOT 1.2  PROT 5.0*  ALBUMIN 2.5*   No results for input(s): "LIPASE", "AMYLASE" in the last 72 hours. CBC: Recent Labs    09/29/22 1712 09/29/22 2223 09/30/22 0327  WBC 7.4 8.7 8.9  NEUTROABS 5.6 6.3  --   HGB 8.3* 7.9* 8.7*  HCT 24.2* 24.1* 25.9*  MCV 86.7 90.3 89.0  PLT 119* 123* 122*   Cardiac Enzymes: No results for  input(s): "CKTOTAL", "CKMB", "CKMBINDEX", "TROPONINI" in the last 72 hours. BNP: Invalid input(s): "POCBNP" D-Dimer: No results for input(s): "DDIMER" in the last 72 hours. Hemoglobin A1C: No results for input(s): "HGBA1C" in the last 72 hours. Fasting Lipid Panel: Recent Labs    09/30/22 0327  TRIG 85   Thyroid Function Tests: No results for input(s): "TSH", "T4TOTAL", "T3FREE", "THYROIDAB" in the last 72 hours.  Invalid input(s): "FREET3" Anemia Panel: No results for input(s): "VITAMINB12", "FOLATE", "FERRITIN", "TIBC", "IRON", "RETICCTPCT" in the last 72 hours.   PHYSICAL EXAM General: ill appearing HEENT:  Normocephalic and atramatic Neck:  No JVD.  Lungs: Clear bilaterally to auscultation and percussion. Heart: HRRR . Normal S1 and S2 without gallops or murmurs.  Abdomen: recent ostomy placement Extremities: No clubbing, cyanosis or edema.   Neuro: Alert  Psych: responds appropriately  TELEMETRY: sinus rhythm with frequent PVCs, HR 83 bpm  ASSESSMENT AND PLAN: Patient seen resting comfortably in bed. Denies chest pain, shortness of breath, dizziness. B/p and heart rate well controlled. Will continue to follow closely.    Principal Problem:   Shock circulatory ([place]) Active Problems:   Gangrenous ischemic colitis ([place])   Incarcerated left inguinal hernia   Sepsis (  [place])   Lactic acidosis   Malnutrition of moderate degree    Sibley Rolison, FNP-C 09/30/2022 1:20 PM

## 2022-09-30 NOTE — Procedures (Signed)
  Arterial Catheter Insertion Procedure Note  ARNEZ STONEKING  [id number]  11-Apr-1941  Date:09/30/22  Time:8:51 PM   Provider Performing: Karen Kays   Procedure: Insertion of Arterial Line [phone]) with US guidance ([id number])   Indication(s) Blood pressure monitoring and/or need for frequent ABGs  Consent Risks of the procedure as well as the alternatives and risks of each were explained to the patient and/or caregiver.  Consent for the procedure was obtained and is signed in the bedside chart  Anesthesia None  Time Out Verified patient identification, verified procedure, site/side was marked, verified correct patient position, special equipment/implants available, medications/allergies/relevant history reviewed, required imaging and test results available.  Sterile Technique Maximal sterile technique including full sterile barrier drape, hand hygiene, sterile gown, sterile gloves, mask, hair covering, sterile ultrasound probe cover (if used).  Procedure Description Area of catheter insertion was cleaned with chlorhexidine and draped in sterile fashion. With real-time ultrasound guidance an arterial catheter was placed into the left femoral artery.  Appropriate arterial tracings confirmed on monitor.    Complications/Tolerance None; patient tolerated the procedure well.  EBL Minimal  Specimen(s) None   Rufina Falco, DNP, CCRN, FNP-C, AGACNP-BC Acute Care & Family Nurse Practitioner  [facility]  See Amion for personal pager PCCM on call pager [phone] until 7 am

## 2022-10-01 ENCOUNTER — Telehealth: Payer: Self-pay | Admitting: Internal Medicine

## 2022-10-01 DIAGNOSIS — R579 Shock, unspecified: Secondary | ICD-10-CM | POA: Diagnosis not present

## 2022-10-01 LAB — MAGNESIUM: Magnesium: 2.4 mg/dL (ref 1.7–2.4)

## 2022-10-01 LAB — TYPE AND SCREEN
ABO/RH(D): O POS
Antibody Screen: NEGATIVE
Unit division: 0
Unit division: 0

## 2022-10-01 LAB — CBC WITH DIFFERENTIAL/PLATELET
Abs Immature Granulocytes: 0.55 10*3/uL — ABNORMAL HIGH (ref 0.00–0.07)
Basophils Absolute: 0.1 10*3/uL (ref 0.0–0.1)
Basophils Relative: 1 %
Eosinophils Absolute: 0 10*3/uL (ref 0.0–0.5)
Eosinophils Relative: 0 %
HCT: 23.7 % — ABNORMAL LOW (ref 39.0–52.0)
Hemoglobin: 8.1 g/dL — ABNORMAL LOW (ref 13.0–17.0)
Immature Granulocytes: 4 %
Lymphocytes Relative: 7 %
Lymphs Abs: 1 10*3/uL (ref 0.7–4.0)
MCH: 28.8 pg (ref 26.0–34.0)
MCHC: 34.2 g/dL (ref 30.0–36.0)
MCV: 84.3 fL (ref 80.0–100.0)
Monocytes Absolute: 2.5 10*3/uL — ABNORMAL HIGH (ref 0.1–1.0)
Monocytes Relative: 16 %
Neutro Abs: 11.4 10*3/uL — ABNORMAL HIGH (ref 1.7–7.7)
Neutrophils Relative %: 72 %
Platelets: 95 10*3/uL — ABNORMAL LOW (ref 150–400)
RBC: 2.81 MIL/uL — ABNORMAL LOW (ref 4.22–5.81)
RDW: 15.2 % (ref 11.5–15.5)
Smear Review: NORMAL
WBC: 15.6 10*3/uL — ABNORMAL HIGH (ref 4.0–10.5)
nRBC: 0.6 % — ABNORMAL HIGH (ref 0.0–0.2)

## 2022-10-01 LAB — CULTURE, BLOOD (ROUTINE X 2)
Culture: NO GROWTH
Culture: NO GROWTH
Special Requests: ADEQUATE
Special Requests: ADEQUATE

## 2022-10-01 LAB — BPAM RBC
Blood Product Expiration Date: 202402132359
Blood Product Expiration Date: 202402132359
ISSUE DATE / TIME: 202401071157
ISSUE DATE / TIME: 202401072154
Unit Type and Rh: 5100
Unit Type and Rh: 5100

## 2022-10-01 LAB — GLUCOSE, CAPILLARY
Glucose-Capillary: 144 mg/dL — ABNORMAL HIGH (ref 70–99)
Glucose-Capillary: 141 mg/dL — ABNORMAL HIGH (ref 70–99)

## 2022-10-01 LAB — COMPREHENSIVE METABOLIC PANEL
ALT: 45 U/L — ABNORMAL HIGH (ref 0–44)
AST: 168 U/L — ABNORMAL HIGH (ref 15–41)
Albumin: 2.1 g/dL — ABNORMAL LOW (ref 3.5–5.0)
Alkaline Phosphatase: 83 U/L (ref 38–126)
Anion gap: 12 (ref 5–15)
BUN: 68 mg/dL — ABNORMAL HIGH (ref 8–23)
CO2: 19 mmol/L — ABNORMAL LOW (ref 22–32)
Calcium: 6.7 mg/dL — ABNORMAL LOW (ref 8.9–10.3)
Chloride: 110 mmol/L (ref 98–111)
Creatinine, Ser: 1.56 mg/dL — ABNORMAL HIGH (ref 0.61–1.24)
GFR, Estimated: 44 mL/min — ABNORMAL LOW (ref >60)
Glucose, Bld: 166 mg/dL — ABNORMAL HIGH (ref 70–99)
Potassium: 3.5 mmol/L (ref 3.5–5.1)
Sodium: 141 mmol/L (ref 135–145)
Total Bilirubin: 1.1 mg/dL (ref 0.3–1.2)
Total Protein: 4.2 g/dL — ABNORMAL LOW (ref 6.5–8.1)

## 2022-10-01 LAB — SURGICAL PATHOLOGY

## 2022-10-01 LAB — HEMOGLOBIN AND HEMATOCRIT, BLOOD
HCT: 25.7 % — ABNORMAL LOW (ref 39.0–52.0)
Hemoglobin: 8.5 g/dL — ABNORMAL LOW (ref 13.0–17.0)

## 2022-10-01 LAB — LACTIC ACID, PLASMA: Lactic Acid, Venous: 2.8 mmol/L (ref 0.5–1.9)

## 2022-10-01 LAB — PHOSPHORUS: Phosphorus: 4 mg/dL (ref 2.5–4.6)

## 2022-10-01 MED ORDER — MIDAZOLAM HCL 2 MG/2ML IJ SOLN
2.0000 mg | INTRAMUSCULAR | Status: DC | PRN
  Administered 2022-10-01: 2 mg via INTRAVENOUS
  Filled 2022-10-01: qty 2

## 2022-10-01 MED ORDER — LACTATED RINGERS IV SOLN: INTRAVENOUS | Status: DC

## 2022-10-01 MED ORDER — HYDROMORPHONE HCL 1 MG/ML IJ SOLN
1.0000 mg | INTRAMUSCULAR | Status: DC | PRN
  Administered 2022-10-01 (×2): 1 mg via INTRAVENOUS
  Filled 2022-10-01 (×2): qty 1

## 2022-10-01 MED ORDER — GLYCOPYRROLATE 0.2 MG/ML IJ SOLN: 0.2000 mg | INTRAMUSCULAR | Status: DC | PRN

## 2022-10-01 MED ORDER — MORPHINE 100MG IN NS 100ML (1MG/ML) PREMIX INFUSION
0.0000 mg/h | INTRAVENOUS | Status: DC
  Administered 2022-10-01: 5 mg/h via INTRAVENOUS
  Filled 2022-10-01: qty 100

## 2022-10-01 MED ORDER — MORPHINE SULFATE (PF) 2 MG/ML IV SOLN: 1.0000 mg | INTRAVENOUS | Status: DC | PRN

## 2022-10-01 MED ORDER — GLYCOPYRROLATE 1 MG PO TABS: 1.0000 mg | ORAL_TABLET | ORAL | Status: DC | PRN

## 2022-10-01 MED ORDER — LACTATED RINGERS IV BOLUS
500.0000 mL | Freq: Once | INTRAVENOUS | Status: AC
  Administered 2022-10-01: 500 mL via INTRAVENOUS

## 2022-10-01 MED ORDER — ACETAMINOPHEN 325 MG PO TABS: 650.0000 mg | ORAL_TABLET | Freq: Four times a day (QID) | ORAL | Status: DC | PRN

## 2022-10-01 MED ORDER — GLYCOPYRROLATE 0.2 MG/ML IJ SOLN
0.2000 mg | INTRAMUSCULAR | Status: DC | PRN
  Administered 2022-10-01: 0.2 mg via INTRAVENOUS
  Filled 2022-10-01: qty 1

## 2022-10-01 MED ORDER — FENTANYL CITRATE PF 50 MCG/ML IJ SOSY
25.0000 ug | PREFILLED_SYRINGE | Freq: Once | INTRAMUSCULAR | Status: AC
  Administered 2022-10-01: 25 ug via INTRAVENOUS
  Filled 2022-10-01: qty 1

## 2022-10-01 MED ORDER — SODIUM CHLORIDE 0.9 % IV SOLN: INTRAVENOUS | Status: DC

## 2022-10-01 MED ORDER — MORPHINE BOLUS VIA INFUSION: 5.0000 mg | INTRAVENOUS | Status: DC | PRN

## 2022-10-01 MED ORDER — POLYVINYL ALCOHOL 1.4 % OP SOLN: 1.0000 [drp] | Freq: Four times a day (QID) | OPHTHALMIC | Status: DC | PRN

## 2022-10-01 MED ORDER — ACETAMINOPHEN 650 MG RE SUPP: 650.0000 mg | Freq: Four times a day (QID) | RECTAL | Status: DC | PRN

## 2022-10-02 ENCOUNTER — Other Ambulatory Visit: Payer: Medicare Other

## 2022-10-02 LAB — BLOOD GAS, ARTERIAL
Acid-base deficit: 1.5 mmol/L (ref 0.0–2.0)
Bicarbonate: 21.3 mmol/L (ref 20.0–28.0)
FIO2: 40 %
MECHVT: 580 mL
Mechanical Rate: 18
O2 Saturation: 97.6 %
PEEP: 5 cmH2O
Patient temperature: 37
pCO2 arterial: 30 mmHg — ABNORMAL LOW (ref 32–48)
pH, Arterial: 7.46 — ABNORMAL HIGH (ref 7.35–7.45)
pO2, Arterial: 101 mmHg (ref 83–108)

## 2022-10-25 NOTE — Progress Notes (Signed)
[facility] Gastroenterology Inpatient Progress Note    Subjective: Patient seen for follow-up of his severe gangrenous ischemic sigmoid colitis status post emergent colectomy with colostomy.  Patient continues to make output of over a liter of bright red blood yesterday.  A third pressor has been added and 2 pressors (vasopressin and Levophed) have been maxed out. The patient's daughter Daniel Austin is at the bedside and wishes to discuss comfort care measures only with the intensivist. The patient appears more lethargic today than on yesterday and my initial contact with him.  He is not conversive and appears somewhat obtunded.  Objective: Vital signs in last 24 hours: Temp:  [97.9 F (36.6 C)-99.3 F (37.4 C)] 99 F (37.2 C) Oct 18, 2022 0700) Pulse Rate:  [49-124] 116 10-18-2022 0700) Resp:  [0-44] 34 10-18-22 0700) BP: (82-166)/(40-137) 101/48 10-18-2022 0700) SpO2:  [93 %-100 %] 100 % 10-18-22 0700) Arterial Line BP: (89-131)/(36-55) 110/47 2022-10-18 0700) FiO2 (%):  [28 %] 28 % 10/18/22 0600) Weight:  [90.6 kg] 90.6 kg 18-Oct-2022 0327) Blood pressure (!) 101/48, pulse (!) 116, temperature 99 F (37.2 C), resp. rate (!) 34, height 6' (1.829 m), weight 90.6 kg, SpO2 100 %.    Intake/Output from previous day: 01/07 0701 - Oct 18, 2022 0700 In: 4904.1 [I.V.:2903.6; Blood:800; IV Piggyback:1200.4] Out: 3700 [Urine:1100; Stool:2600]  Intake/Output this shift: No intake/output data recorded.   Gen: NAD. Appears obtunded and diaphoretic  HEENT: [place]/AT. PERRLA. Normal external ear exam.  Chest: CTA, no wheezes.  Breathing is shallow and rapid.  CV: RR nl S1, S2. No gallops.  Abd: Soft, bowel sounds hypoactive, blood noted in the colostomy bag.  Ext: no edema. Pulses 2+  Neuro: Alert and oriented. Judgement appears normal. Nonfocal.   Lab Results: Results for orders placed or performed during the hospital encounter of 09/24/2022 (from the past 24 hour(s))  Lactic acid, plasma     Status: Abnormal    Collection Time: 09/30/22  8:19 AM  Result Value Ref Range   Lactic Acid, Venous 2.6 (HH) 0.5 - 1.9 mmol/L  Prepare RBC (crossmatch)     Status: None   Collection Time: 09/30/22 11:30 AM  Result Value Ref Range   Order Confirmation      ORDER PROCESSED BY BLOOD BANK Performed at [facility], [address] [place], [place] [address]   Glucose, capillary     Status: Abnormal   Collection Time: 09/30/22 11:30 AM  Result Value Ref Range   Glucose-Capillary 147 (H) 70 - 99 mg/dL  Glucose, capillary     Status: Abnormal   Collection Time: 09/30/22  3:55 PM  Result Value Ref Range   Glucose-Capillary 109 (H) 70 - 99 mg/dL  Hemoglobin and hematocrit, blood     Status: Abnormal   Collection Time: 09/30/22  4:28 PM  Result Value Ref Range   Hemoglobin 8.1 (L) 13.0 - 17.0 g/dL   HCT 23.6 (L) 39.0 - 52.0 %  Prepare RBC (crossmatch)     Status: None   Collection Time: 09/30/22  7:19 PM  Result Value Ref Range   Order Confirmation      ORDER PROCESSED BY BLOOD BANK Performed at [facility], [address] [place], [place] [address]   Glucose, capillary     Status: Abnormal   Collection Time: 09/30/22  7:28 PM  Result Value Ref Range   Glucose-Capillary 135 (H) 70 - 99 mg/dL  Hemoglobin and hematocrit, blood     Status: Abnormal   Collection Time: 09/30/22  8:17 PM  Result Value Ref Range   Hemoglobin 7.5 (L) 13.0 - 17.0 g/dL   HCT 22.2 (L) 39.0 - 52.0 %  Lactic acid, plasma     Status: Abnormal   Collection Time: 09/30/22 10:08 PM  Result Value Ref Range   Lactic Acid, Venous 4.0 (HH) 0.5 - 1.9 mmol/L  Glucose, capillary     Status: Abnormal   Collection Time: 09/30/22 11:12 PM  Result Value Ref Range   Glucose-Capillary 130 (H) 70 - 99 mg/dL  Hemoglobin and hematocrit, blood     Status: Abnormal   Collection Time: October 07, 2022  1:58 AM  Result Value Ref Range   Hemoglobin 8.5 (L) 13.0 - 17.0 g/dL   HCT 25.7 (L) 39.0 - 52.0 %  Comprehensive metabolic panel      Status: Abnormal   Collection Time: 10-07-2022  1:58 AM  Result Value Ref Range   Sodium 141 135 - 145 mmol/L   Potassium 3.5 3.5 - 5.1 mmol/L   Chloride 110 98 - 111 mmol/L   CO2 19 (L) 22 - 32 mmol/L   Glucose, Bld 166 (H) 70 - 99 mg/dL   BUN 68 (H) 8 - 23 mg/dL   Creatinine, Ser 1.56 (H) 0.61 - 1.24 mg/dL   Calcium 6.7 (L) 8.9 - 10.3 mg/dL   Total Protein 4.2 (L) 6.5 - 8.1 g/dL   Albumin 2.1 (L) 3.5 - 5.0 g/dL   AST 168 (H) 15 - 41 U/L   ALT 45 (H) 0 - 44 U/L   Alkaline Phosphatase 83 38 - 126 U/L   Total Bilirubin 1.1 0.3 - 1.2 mg/dL   GFR, Estimated 44 (L) >60 mL/min   Anion gap 12 5 - 15  CBC with Differential/Platelet     Status: Abnormal   Collection Time: Oct 07, 2022  1:58 AM  Result Value Ref Range   WBC 15.6 (H) 4.0 - 10.5 K/uL   RBC 2.81 (L) 4.22 - 5.81 MIL/uL   Hemoglobin 8.1 (L) 13.0 - 17.0 g/dL   HCT 23.7 (L) 39.0 - 52.0 %   MCV 84.3 80.0 - 100.0 fL   MCH 28.8 26.0 - 34.0 pg   MCHC 34.2 30.0 - 36.0 g/dL   RDW 15.2 11.5 - 15.5 %   Platelets 95 (L) 150 - 400 K/uL   nRBC 0.6 (H) 0.0 - 0.2 %   Neutrophils Relative % 72 %   Neutro Abs 11.4 (H) 1.7 - 7.7 K/uL   Lymphocytes Relative 7 %   Lymphs Abs 1.0 0.7 - 4.0 K/uL   Monocytes Relative 16 %   Monocytes Absolute 2.5 (H) 0.1 - 1.0 K/uL   Eosinophils Relative 0 %   Eosinophils Absolute 0.0 0.0 - 0.5 K/uL   Basophils Relative 1 %   Basophils Absolute 0.1 0.0 - 0.1 K/uL   WBC Morphology MILD LEFT SHIFT (1-5% METAS, OCC MYELO, OCC BANDS)    RBC Morphology See Note    Smear Review Normal platelet morphology    Immature Granulocytes 4 %   Abs Immature Granulocytes 0.55 (H) 0.00 - 0.07 K/uL  Magnesium     Status: None   Collection Time: October 07, 2022  1:58 AM  Result Value Ref Range   Magnesium 2.4 1.7 - 2.4 mg/dL  Phosphorus     Status: None   Collection Time: 10-07-22  1:58 AM  Result Value Ref Range   Phosphorus 4.0 2.5 - 4.6 mg/dL  Glucose, capillary     Status: Abnormal   Collection Time:  2022-10-21  3:31 AM   Result Value Ref Range   Glucose-Capillary 144 (H) 70 - 99 mg/dL     Recent Labs    09/29/22 2223 09/30/22 0327 09/30/22 1628 09/30/22 2017 2022-10-21 0158  WBC 8.7 8.9  --   --  15.6*  HGB 7.9* 8.7* 8.1* 7.5* 8.1*  8.5*  HCT 24.1* 25.9* 23.6* 22.2* 23.7*  25.7*  PLT 123* 122*  --   --  95*   BMET Recent Labs    09/29/22 2223 09/30/22 0327 2022/10/21 0158  NA 138 138 141  K 3.9 3.7 3.5  CL 103 105 110  CO2 23 22 19*  GLUCOSE 150* 160* 166*  BUN 47* 50* 68*  CREATININE 1.40* 1.39* 1.56*  CALCIUM 7.2* 7.0* 6.7*   LFT Recent Labs    2022-10-21 0158  PROT 4.2*  ALBUMIN 2.1*  AST 168*  ALT 45*  ALKPHOS 83  BILITOT 1.1   PT/INR No results for input(s): "LABPROT", "INR" in the last 72 hours. Hepatitis Panel No results for input(s): "HEPBSAG", "HCVAB", "HEPAIGM", "HEPBIGM" in the last 72 hours. C-Diff No results for input(s): "CDIFFTOX" in the last 72 hours. No results for input(s): "CDIFFPCR" in the last 72 hours.   Studies/Results: DG Abd Portable 1V  Result Date: 09/30/2022 CLINICAL DATA:  NG tube placement EXAM: PORTABLE ABDOMEN - 1 VIEW COMPARISON:  09/27/2022 FINDINGS: NG tube tip in the stomach. Dilated gas-filled loop of colon in the right upper quadrant. IMPRESSION: Feeding tube tip in the stomach. Electronically Signed   By: Placido Sou M.D.   On: 09/30/2022 20:10   ECHOCARDIOGRAM LIMITED  Result Date: 09/29/2022    ECHOCARDIOGRAM LIMITED REPORT   Patient Name:   Daniel Austin Date of Exam: 09/29/2022 Medical Rec #:  [id number]        Height:       72.0 in Accession #:    [id number]       Weight:       198.6 lb Date of Birth:  11-Mar-1941        BSA:          2.124 m Patient Age:    17 years         BP:           100/62 mmHg Patient Gender: M                HR:           123 bpm. Exam Location:  [facility] Procedure: Limited Echo STAT ECHO Indications:     Pericardial effusion I31.3  History:         Patient has prior history of Echocardiogram examinations, most                   recent 09/27/2022.  Sonographer:     Kathlen Brunswick RDCS Referring Phys:  [id number] [place] Diagnosing Phys: Neoma Laming  Sonographer Comments: Technically challenging study due to limited acoustic windows, suboptimal subcostal window, suboptimal parasternal window and suboptimal apical window. Image acquisition challenging due to patient body habitus. IMPRESSIONS  1. Left ventricular ejection fraction, by estimation, is 60 to 65%. The left ventricle has normal function. The left ventricle has no regional wall motion abnormalities.  2. Right ventricular systolic function is normal. The right ventricular size is normal.  3. Left atrial size was moderately dilated.  4. Right atrial size was moderately dilated.  5. A small pericardial effusion is present. The pericardial effusion is posterior  to the left ventricle and anterior to the right ventricle. There is no evidence of cardiac tamponade.  6. The mitral valve is normal in structure. No evidence of mitral valve regurgitation. No evidence of mitral stenosis.  7. The aortic valve is normal in structure. Aortic valve regurgitation is not visualized. No aortic stenosis is present.  8. The inferior vena cava is normal in size with greater than 50% respiratory variability, suggesting right atrial pressure of 3 mmHg. FINDINGS  Left Ventricle: Left ventricular ejection fraction, by estimation, is 60 to 65%. The left ventricle has normal function. The left ventricle has no regional wall motion abnormalities. The left ventricular internal cavity size was normal in size. There is  no concentric left ventricular hypertrophy. Right Ventricle: The right ventricular size is normal. No increase in right ventricular wall thickness. Right ventricular systolic function is normal. Left Atrium: Left atrial size was moderately dilated. Right Atrium: Right atrial size was moderately dilated. Pericardium: A small pericardial effusion is present. The pericardial effusion  is posterior to the left ventricle and anterior to the right ventricle. There is excessive respiratory variation in the mitral valve spectral Doppler velocities and excessive respiratory variation in the tricuspid valve spectral Doppler velocities. There is no evidence of cardiac tamponade. Mitral Valve: The mitral valve is normal in structure. No evidence of mitral valve stenosis. Tricuspid Valve: The tricuspid valve is normal in structure. Tricuspid valve regurgitation is not demonstrated. No evidence of tricuspid stenosis. Aortic Valve: The aortic valve is normal in structure. Aortic valve regurgitation is not visualized. No aortic stenosis is present. Pulmonic Valve: The pulmonic valve was normal in structure. Pulmonic valve regurgitation is not visualized. No evidence of pulmonic stenosis. Aorta: The aortic root is normal in size and structure. Venous: The inferior vena cava is normal in size with greater than 50% respiratory variability, suggesting right atrial pressure of 3 mmHg. IAS/Shunts: No atrial level shunt detected by color flow Doppler. [employer] Electronically signed by Neoma Laming Signature Date/Time: 09/29/2022/3:34:33 PM    Final     Scheduled Inpatient Medications:    sodium chloride   Intravenous Once   sodium chloride   Intravenous Once   Chlorhexidine Gluconate Cloth  6 each Topical Daily   citalopram  20 mg Per Tube Q2000   feeding supplement (PROSource TF20)  60 mL Per Tube Daily   free water  30 mL Per Tube Q4H   insulin aspart  0-15 Units Subcutaneous Q4H   levothyroxine  100 mcg Intravenous Daily   mouth rinse  15 mL Mouth Rinse 4 times per day   pantoprazole (PROTONIX) IV  40 mg Intravenous Q24H    Continuous Inpatient Infusions:    sodium chloride Stopped (09/29/22 2331)   acetaminophen Stopped (10-28-22 0526)   ceFEPime (MAXIPIME) IV Stopped (10/28/2022 0051)   feeding supplement (VITAL 1.5 CAL) Stopped (09/29/22 1030)   lactated ringers 75 mL/hr at October 28, 2022 0600    metronidazole Stopped (09/30/22 2305)   norepinephrine (LEVOPHED) Adult infusion 40 mcg/min (2022-10-28 0600)   phenylephrine (NEO-SYNEPHRINE) Adult infusion 250 mcg/min (October 28, 2022 0641)   vasopressin 0.04 Units/min (10-28-2022 0600)    PRN Inpatient Medications:  HYDROmorphone (DILAUDID) injection, morphine injection, mouth rinse  Miscellaneous: NA  Assessment:  Bleeding from Colostomy -This has worsened over the last 24 hours. Ddx includes ischemia of the ostomy, colonic diverticulosis, ischemic colitis proximal to the site of resection, infectious colitis, UGI source of bleeding (PUD, dieuloafoy lesion, etc.). Given appearance of blood, would still consider site  of bleeding at or just proximal to the ostomy itself.  Anemia secondary to GI blood loss. Responding well hemodynamically to blood transfusion. Hypotension on vasopressors - possibly multifactorial (Cardiac tamponade, Gi blood loss, sepsis, CHF, etc.). Has continual need of vasopressor support (on vasopressin, levophed and now neosynephrine). HX of Gastric ulcer. HFrEF  Plan:  Case discussed with Dr. Mortimer Fries, intensivist.  Currently no good GI options are available for stopping this type of bleed in the current setting. Currently the patient is speaking for her mother who has Parkinson's disease and mild dementia.  The daughter is reluctant to pursue any procedures and wishes to provide comfort care to her father based upon his declining clinical status. Will follow peripherally and see again as needed.  Meira Wahba K. Alice Reichert, M.D. 24-Oct-2022, 7:59 AM

## 2022-10-25 NOTE — Progress Notes (Signed)
   2022/10/16 1100  Spiritual Encounters  Type of Visit Follow up  Care provided to: Pt and family  Referral source Nurse (RN/NT/LPN)  Reason for visit End-of-life   Chaplain responded to nurse consult. Chaplain provided compassionate presence and reflective listening as family spoke about moving patient to comfort care. Chaplain supported family during their sadness and grief of having to make this decision. Chaplain services are available for follow up as needed.

## 2022-10-25 NOTE — Progress Notes (Incomplete)
[facility] Cardiology    SUBJECTIVE: ***   Vitals:   10/03/22 0500 October 03, 2022 0600 Oct 03, 2022 0615 03-Oct-2022 0700  BP: (!) 108/48 (!) 103/50  (!) 101/48  Pulse: 88   (!) 116  Resp: (!) 34 (!) 40 (!) 22 (!) 34  Temp:  99.3 F (37.4 C)  99 F (37.2 C)  TempSrc:  Axillary    SpO2: 100% 100%  100%  Weight:      Height:         Intake/Output Summary (Last 24 hours) at 03-Oct-2022 1315 Last data filed at 10-03-22 0600 Gross per 24 hour  Intake 3952.32 ml  Output 3025 ml  Net 927.32 ml      PHYSICAL EXAM  General: Well developed, well nourished, in no acute distress HEENT:  Normocephalic and atramatic Neck:  No JVD.  Lungs: Clear bilaterally to auscultation and percussion. Heart: HRRR . Normal S1 and S2 without gallops or murmurs.  Abdomen: Bowel sounds are positive, abdomen soft and non-tender  Msk:  Back normal, normal gait. Normal strength and tone for age. Extremities: No clubbing, cyanosis or edema.   Neuro: Alert and oriented X 3. Psych:  Good affect, responds appropriately   LABS: Basic Metabolic Panel: Recent Labs    09/30/22 0327 10-03-2022 0158  NA 138 141  K 3.7 3.5  CL 105 110  CO2 22 19*  GLUCOSE 160* 166*  BUN 50* 68*  CREATININE 1.39* 1.56*  CALCIUM 7.0* 6.7*  MG 2.1 2.4  PHOS 4.1 4.0   Liver Function Tests: Recent Labs    09/29/22 2223 10-03-2022 0158  AST 146* 168*  ALT 41 45*  ALKPHOS 87 83  BILITOT 1.2 1.1  PROT 5.0* 4.2*  ALBUMIN 2.5* 2.1*   No results for input(s): "LIPASE", "AMYLASE" in the last 72 hours. CBC: Recent Labs    09/29/22 2223 09/30/22 0327 09/30/22 1628 09/30/22 2017 2022-10-03 0158  WBC 8.7 8.9  --   --  15.6*  NEUTROABS 6.3  --   --   --  11.4*  HGB 7.9* 8.7*   < > 7.5* 8.1*  8.5*  HCT 24.1* 25.9*   < > 22.2* 23.7*  25.7*  MCV 90.3 89.0  --   --  84.3  PLT 123* 122*  --   --  95*   < > = values in this interval not displayed.   Cardiac Enzymes: No results for input(s): "CKTOTAL", "CKMB", "CKMBINDEX", "TROPONINI" in  the last 72 hours. BNP: Invalid input(s): "POCBNP" D-Dimer: No results for input(s): "DDIMER" in the last 72 hours. Hemoglobin A1C: No results for input(s): "HGBA1C" in the last 72 hours. Fasting Lipid Panel: Recent Labs    09/30/22 0327  TRIG 85   Thyroid Function Tests: No results for input(s): "TSH", "T4TOTAL", "T3FREE", "THYROIDAB" in the last 72 hours.  Invalid input(s): "FREET3" Anemia Panel: No results for input(s): "VITAMINB12", "FOLATE", "FERRITIN", "TIBC", "IRON", "RETICCTPCT" in the last 72 hours.  DG Abd Portable 1V  Result Date: 09/30/2022 CLINICAL DATA:  NG tube placement EXAM: PORTABLE ABDOMEN - 1 VIEW COMPARISON:  09/27/2022 FINDINGS: NG tube tip in the stomach. Dilated gas-filled loop of colon in the right upper quadrant. IMPRESSION: Feeding tube tip in the stomach. Electronically Signed   By: Placido Sou M.D.   On: 09/30/2022 20:10   ECHOCARDIOGRAM LIMITED  Result Date: 09/29/2022    ECHOCARDIOGRAM LIMITED REPORT   Patient Name:   Daniel Austin Date of Exam: 09/29/2022 Medical Rec #:  [id number]  Height:       72.0 in Accession #:    [id number]       Weight:       198.6 lb Date of Birth:  1940-09-27        BSA:          2.124 m Patient Age:    82 years         BP:           100/62 mmHg Patient Gender: M                HR:           123 bpm. Exam Location:  [facility] Procedure: Limited Echo STAT ECHO Indications:     Pericardial effusion I31.3  History:         Patient has prior history of Echocardiogram examinations, most                  recent 09/27/2022.  Sonographer:     Kathlen Brunswick RDCS Referring Phys:  [id number] [place] Diagnosing Phys: Neoma Laming  Sonographer Comments: Technically challenging study due to limited acoustic windows, suboptimal subcostal window, suboptimal parasternal window and suboptimal apical window. Image acquisition challenging due to patient body habitus. IMPRESSIONS  1. Left ventricular ejection fraction, by estimation, is 60 to  65%. The left ventricle has normal function. The left ventricle has no regional wall motion abnormalities.  2. Right ventricular systolic function is normal. The right ventricular size is normal.  3. Left atrial size was moderately dilated.  4. Right atrial size was moderately dilated.  5. A small pericardial effusion is present. The pericardial effusion is posterior to the left ventricle and anterior to the right ventricle. There is no evidence of cardiac tamponade.  6. The mitral valve is normal in structure. No evidence of mitral valve regurgitation. No evidence of mitral stenosis.  7. The aortic valve is normal in structure. Aortic valve regurgitation is not visualized. No aortic stenosis is present.  8. The inferior vena cava is normal in size with greater than 50% respiratory variability, suggesting right atrial pressure of 3 mmHg. FINDINGS  Left Ventricle: Left ventricular ejection fraction, by estimation, is 60 to 65%. The left ventricle has normal function. The left ventricle has no regional wall motion abnormalities. The left ventricular internal cavity size was normal in size. There is  no concentric left ventricular hypertrophy. Right Ventricle: The right ventricular size is normal. No increase in right ventricular wall thickness. Right ventricular systolic function is normal. Left Atrium: Left atrial size was moderately dilated. Right Atrium: Right atrial size was moderately dilated. Pericardium: A small pericardial effusion is present. The pericardial effusion is posterior to the left ventricle and anterior to the right ventricle. There is excessive respiratory variation in the mitral valve spectral Doppler velocities and excessive respiratory variation in the tricuspid valve spectral Doppler velocities. There is no evidence of cardiac tamponade. Mitral Valve: The mitral valve is normal in structure. No evidence of mitral valve stenosis. Tricuspid Valve: The tricuspid valve is normal in structure.  Tricuspid valve regurgitation is not demonstrated. No evidence of tricuspid stenosis. Aortic Valve: The aortic valve is normal in structure. Aortic valve regurgitation is not visualized. No aortic stenosis is present. Pulmonic Valve: The pulmonic valve was normal in structure. Pulmonic valve regurgitation is not visualized. No evidence of pulmonic stenosis. Aorta: The aortic root is normal in size and structure. Venous: The inferior vena cava is normal in size with greater  than 50% respiratory variability, suggesting right atrial pressure of 3 mmHg. IAS/Shunts: No atrial level shunt detected by color flow Doppler. [place] Electronically signed by Neoma Laming Signature Date/Time: 09/29/2022/3:34:33 PM    Final      Echo ***  TELEMETRY: ***:  ASSESSMENT AND PLAN:  Principal Problem:   Shock circulatory ([place]) Active Problems:   Gangrenous ischemic colitis ([place])   Incarcerated left inguinal hernia   Sepsis ([place])   Lactic acidosis   Malnutrition of moderate degree    1. ***   Yolonda Kida, MD, PHD [facility] 10/13/2022 1:15 PM

## 2022-10-25 NOTE — TOC Initial Note (Signed)
Transition of Care [facility]) - Initial/Assessment Note    Patient Details  Name: Daniel Austin MRN: [id number] Date of Birth: 05-04-1941  Transition of Care [facility]) CM/SW Contact:    Shelbie Hutching, RN Phone Number: 2022/10/26, 11:18 AM  Clinical Narrative:                 Patient admitted to the hospital with sepsis and shock.  Family has decided to make patient comfort care. TOC available if needs arise.   Expected Discharge Plan:  (TBD) Barriers to Discharge: Continued Medical Work up   Patient Goals and CMS Choice Patient states their goals for this hospitalization and ongoing recovery are:: Familty has decided on comfort care          Expected Discharge Plan and Services                                              Prior Living Arrangements/Services   Lives with:: Spouse                   Activities of Daily Living      Permission Sought/Granted                  Emotional Assessment   Attitude/Demeanor/Rapport: Unresponsive Affect (typically observed): Unable to Assess   Alcohol / Substance Use: Not Applicable Psych Involvement: No (comment)  Admission diagnosis:  Hypovolemic shock (HCC) [R57.1] Enteritis [K52.9] Hyponatremia [E87.1] AKI (acute kidney injury) ([place]) [N17.9] Nausea vomiting and diarrhea [R11.2, R19.7] Gastritis and duodenitis [K29.90] Hypothermia, initial encounter [T68.XXXA] Hypotension, unspecified hypotension type [I95.9] Sepsis, due to unspecified organism, unspecified whether acute organ dysfunction present [facility]) [A41.9] Patient Active Problem List   Diagnosis Date Noted   Malnutrition of moderate degree 09/28/2022   Shock circulatory ([place]) 10/14/2022   Gangrenous ischemic colitis ([place]) 09/24/2022   Incarcerated left inguinal hernia 10/06/2022   Sepsis ([place]) 10/11/2022   Lactic acidosis 10/08/2022   Memory change 08/27/2022   History of right common carotid artery stent placement 06/12/2021   Pacemaker  06/12/2021   Weakness 04/30/2020   Hyponatremia 05/10/2019   Dog bite 01/21/2019   Stress 05/05/2017   Weakness of left arm 11/09/2016   H/O total knee replacement 03/24/2016   S/P total knee arthroplasty 27/02/2375   Chronic systolic CHF (congestive heart failure) ([place]) 12/13/2015   HLD (hyperlipidemia) 12/13/2015   BP (high blood pressure) 12/13/2015   Cardiomyopathy, ischemic 12/13/2015   MI (mitral incompetence) 12/13/2015   Billowing mitral valve 12/13/2015   History of ventricular tachycardia 12/13/2015   Carotid stenosis 12/13/2015   Syncope and collapse 12/13/2015   Disease of thyroid gland 12/13/2015   Health care maintenance 09/04/2015   Encounter for completion of form with patient 01/12/2015   Left arm numbness 09/30/2014   Ischemia of upper extremity 09/30/2014   Pins and needles sensation 09/30/2014   Unspecified visual disturbance 09/30/2014   Paresthesia of arm 09/30/2014   Primary osteoarthritis of both knees 09/29/2014   History of cardiac catheterization 05/12/2014   Other specified postprocedural states 05/12/2014   Change in vision 01/09/2014   Knee pain, bilateral 01/09/2014   Arthralgia of lower leg 01/09/2014   Left knee pain 09/13/2013   Hyperglycemia 11/05/2012   Carotid artery stenosis 11/05/2012   Carotid artery narrowing 11/05/2012   Carotid artery obstruction 11/05/2012   Abnormal blood sugar  11/05/2012   Hypothyroidism 10/31/2012   Pure hypercholesterolemia 10/31/2012   Dysphagia, unspecified(787.20) 10/31/2012   Coronary atherosclerosis of native coronary artery 10/31/2012   Anemia 10/31/2012   Essential hypertension, benign 10/31/2012   Absolute anemia 10/31/2012   Benign essential HTN 10/31/2012   Adult hypothyroidism 10/31/2012   PCP:  Einar Pheasant, MD Pharmacy:   [id number] [address]Phone: [phone]: [phone] [id number]  [id number]: [phone]: [phone]   Social Determinants of Health (SDOH) Social History: SDLeslieNo Food Insecurity (09/18/2022)  Housing: Low Risk  (09/18/2022)  Transportation Needs: No Transportation Needs (09/18/2022)  Utilities: Not At Risk (09/18/2022)  Depression (PHQ2-9): Medium Risk (09/18/2022)  Financial Resource Strain: Low Risk  (09/18/2022)  Physical Activity: Insufficiently Active (09/18/2022)  Social Connections: Unknown (09/18/2022)  Stress: No Stress Concern Present (09/18/2022)  Tobacco Use: Low Risk  (09/27/2022)   SDOH Interventions:     Readmission Risk Interventions     No data to display

## 2022-10-25 NOTE — Progress Notes (Signed)
Neosynephrine added this shift to keep blood pressure as per set parameters. 1 unit PRBC given this shift. Left femoral arterial line placed c/o NP Elizabeth. 500 cc LR bolus given. Analgesia control cautiously given as blood pressure is an issue despite being on levophed, neo-synephrine and vasopresin. Colostomy draining blood plus clots. Surgical site oozing blood. Daughter at bedside the whole night, updated and was involved in discussing plan of care.

## 2022-10-25 NOTE — Telephone Encounter (Signed)
Genia Plants returned call from Denita Lung, [place].  I read Marisa's message to [place].

## 2022-10-25 NOTE — Progress Notes (Signed)
Patient pronounced by Bedelia Person and Magda Paganini RN. Patient pronounced at 11:53. No heart or lung sounds auscultated. No pulse palpated. Dr. Mortimer Fries, supervisor notified. Family is at bedside. CCMD calling [employer] Awaiting for family to make a decision on funeral home.

## 2022-10-25 NOTE — Progress Notes (Signed)
[facility] SURGICAL PROGRESS NOTE  Patient seen and examined this morning Clinical condition is deteriorating requiring now three vasopressors Continues to have evidence of GI bleed; GI following; hemodynamically unstable Daughter discussed with Dr Mortimer Fries; now proceeding with DNR and comfort measures.  We will remain available as needed.   -- Edison Simon, PA-C [facility] 10-16-2022, 10:21 AM M-F: 7am - 4pm

## 2022-10-25 NOTE — IPAL (Addendum)
  Interdisciplinary Goals of Care Family Meeting   Date carried out: 2022/10/16  Location of the meeting: Bedside  Member's involved: Physician and Family Member or next of kin    GOALS OF CARE DISCUSSION  The Clinical status was relayed to family in detail- Daughter at bedside  Updated and notified of patients medical condition- Patient with increased WOB and using accessory muscles to breathe Explained to family course of therapy and the modalities   Patient with Progressive multiorgan failure with a very high probablity of a very minimal chance of meaningful recovery despite all aggressive and optimal medical therapy.    Daughter understands the situation.  Family has consented and agreed to DNR/DNI status and would like to proceed with Comfort care measures  Will implement end of life visitation policy, Wife at bedside, will proceed with Comfort care measures  Family are satisfied with Plan of action and management. All questions answered  Additional CC time 35 mins   Jacques Fife Patricia Pesa, M.D.  [facility]  Medical Director [place] Director [facility] Cardio-Pulmonary Department

## 2022-10-25 NOTE — Progress Notes (Signed)
Unable to remove patient's ring, yellow colored solid band, on left ring finger. Sent with patient.

## 2022-10-25 NOTE — Telephone Encounter (Signed)
Pt neighbor called stating pt passed away today at 60

## 2022-10-25 NOTE — Telephone Encounter (Signed)
Pcp made aware

## 2022-10-25 NOTE — Death Summary Note (Addendum)
DEATH SUMMARY   Patient Details  Name: Daniel Austin MRN: [id number] DOB: 31-Oct-1940  Admission/Discharge Information   Admit Date:  09/28/22  Date of Death:  10/03/2022   Time of Death:  27AM  Length of Stay: 5  Referring Physician: Einar Pheasant, MD   Reason(s) for Hospitalization  Ischemic bowel  Diagnoses  Preliminary cause of death: ischemic bowel Secondary Diagnoses (including complications and co-morbidities):  Principal Problem:   Shock circulatory [facility]) Active Problems:   Gangrenous ischemic colitis ([place])   Incarcerated left inguinal hernia   Sepsis ([place])   Lactic acidosis   Malnutrition of moderate degree   History of Present Illness:    82 yo M presenting to [facility] ED from home via EMS on September 28, 2022 with a chief complaint of sepsis alert. History provided by daughter and spouse bedside as patient is altered. Patient was in his normal state of health until he took a laxative dose 2 days ago then had uncontrollable diarrhea.  Spouse also described nausea with dry heaves and some mild emesis with poor p.o. intake. She denied fever, chest pain & shortness of breath.  They reported he fell and when the daughter arrived to assist her mother he appeared tremulous and diaphoretic. Family denies EtOH use/ NSAID use. EMS reported being called out due to nausea/vomiting and diarrhea, and the patient had a syncopal episode when first responders stood him up.  EMS reported hypotension in the field patient received a liter NS bolus and was given 4 mg of IV Zofran prior to arrival.   ED course: Upon arrival patient confused and severely hypotensive with tachypnea.  Sepsis protocol initiated.  Labs significant for hyponatremia (possible acute on chronic), hyperglycemia, AKI, AGMA, transaminitis, leukocytosis with lactic acidosis and elevated troponin.  Imaging suspicious for UTI, also revealing severe gastritis.  Post total 2.5 L IV fluid resuscitation between EMS and ED patient  remained hypotensive requiring low-dose peripheral vasopressor support. ABG: 7.30/ 22/67/10.8   CXR 28-Sep-2022: Low lung volumes without active cardiopulmonary disease CT head without contrast 2022-09-28: No acute intracranial abnormality.  Chronic right frontal parietal and right parietoccipital infarcts.  Generalized cerebral atrophy and microvascular disease changes of the supratentorial brain. CT angio chest PE 09-28-22: No evidence of pulmonary embolism or other acute intrathoracic process.  Marked severity coronary artery calcification, small anterior pericardial effusion. CT abdomen and pelvis with contrast 2022/09/28: Findings consistent with marked severity gastritis and/or duodenitis.  Mild to moderate severity diffuse enteritis.  Large amount of stool within the mid to distal sigmoid colon and rectum consistent with constipation.  Sigmoid diverticulosis, 3.8 cm x 3.7 cm x 3.8 cm infrarenal AAA.  7 cm x 4.8 cm left inguinal hernia.  Mild diffuse urinary bladder wall thickening which may represent cystitis.    PCCM consulted for admission due to circulatory shock secondary to hypovolemia and suspected sepsis.   Pertinent  Medical History  Anemia Anxiety & depression CAD ICM status post PCI HFrEF (echo 2018 LVEF 45%) CVA Heart block status post PPM 12/08 Hypertension Hypercholesterolemia GERD Hypothyroidism Gastric ulcer status post-repair   Significant Hospital Events: Including procedures, antibiotic start and stop dates in addition to other pertinent events   September 28, 2022: Admit to ICU with circulatory shock secondary to hypovolemia and sepsis POA in the setting of gastritis and possible UTI requiring peripheral vasopressor support. 1/3: Ischemic sigmoid colitis, likely complicated secondary to sliding left inguinal hernia. EXPLORATORY LAPAROTOMY; SIGMOID COLECTOMY WITH END COLOSTOMY  CREATION (Abdomen)  1/4: Extubated 1/5: Awake, comfortable in  bed. Blood from the stoma noted 1/6: more  blood from the stoma overnight, CTA of the abdomen reassuring 10/25/22 remains in shock   Interim History / Subjective:  +ABD pain Remains on pressors Severe shock Remains critically ill   Patient is suffering and in pain  Microbiology Recent Results (from the past 240 hour(s))  Culture, blood (routine x 2)     Status: None   Collection Time: 10/12/2022  1:01 AM   Specimen: BLOOD  Result Value Ref Range Status   Specimen Description BLOOD RIGHT ANTECUBITAL  Final   Special Requests   Final    BOTTLES DRAWN AEROBIC AND ANAEROBIC Blood Culture adequate volume   Culture   Final    NO GROWTH 5 DAYS Performed at [facility], [address] [place], [place] [address]    Report Status 10/25/22 FINAL  Final  Resp panel by RT-PCR (RSV, Flu A&B, Covid) Anterior Nasal Swab     Status: None   Collection Time: 10/17/2022  1:01 AM   Specimen: Anterior Nasal Swab  Result Value Ref Range Status   SARS Coronavirus 2 by RT PCR NEGATIVE NEGATIVE Final    Comment: (NOTE) SARS-CoV-2 target nucleic acids are NOT DETECTED.  The SARS-CoV-2 RNA is generally detectable in upper respiratory specimens during the acute phase of infection. The lowest concentration of SARS-CoV-2 viral copies this assay can detect is 138 copies/mL. A negative result does not preclude SARS-Cov-2 infection and should not be used as the sole basis for treatment or other patient management decisions. A negative result may occur with  improper specimen collection/handling, submission of specimen other than nasopharyngeal swab, presence of viral mutation(s) within the areas targeted by this assay, and inadequate number of viral copies(<138 copies/mL). A negative result must be combined with clinical observations, patient history, and epidemiological information. The expected result is Negative.  Fact Sheet for Patients:  EntrepreneurPulse.com.au  Fact Sheet for Healthcare Providers:   IncredibleEmployment.be  This test is no t yet approved or cleared by the [place] FDA and  has been authorized for detection and/or diagnosis of SARS-CoV-2 by FDA under an Emergency Use Authorization (EUA). This EUA will remain  in effect (meaning this test can be used) for the duration of the COVID-19 declaration under Section 564(b)(1) of the Act, 21 U.S.C.section 360bbb-3(b)(1), unless the authorization is terminated  or revoked sooner.       Influenza A by PCR NEGATIVE NEGATIVE Final   Influenza B by PCR NEGATIVE NEGATIVE Final    Comment: (NOTE) The Xpert Xpress SARS-CoV-2/FLU/RSV plus assay is intended as an aid in the diagnosis of influenza from Nasopharyngeal swab specimens and should not be used as a sole basis for treatment. Nasal washings and aspirates are unacceptable for Xpert Xpress SARS-CoV-2/FLU/RSV testing.  Fact Sheet for Patients: EntrepreneurPulse.com.au  Fact Sheet for Healthcare Providers: IncredibleEmployment.be  This test is not yet approved or cleared by the [place] FDA and has been authorized for detection and/or diagnosis of SARS-CoV-2 by FDA under an Emergency Use Authorization (EUA). This EUA will remain in effect (meaning this test can be used) for the duration of the COVID-19 declaration under Section 564(b)(1) of the Act, 21 U.S.C. section 360bbb-3(b)(1), unless the authorization is terminated or revoked.     Resp Syncytial Virus by PCR NEGATIVE NEGATIVE Final    Comment: (NOTE) Fact Sheet for Patients: EntrepreneurPulse.com.au  Fact Sheet for Healthcare Providers: IncredibleEmployment.be  This test is not yet approved or cleared by the [place] FDA  and has been authorized for detection and/or diagnosis of SARS-CoV-2 by FDA under an Emergency Use Authorization (EUA). This EUA will remain in effect (meaning this test can be used) for  the duration of the COVID-19 declaration under Section 564(b)(1) of the Act, 21 U.S.C. section 360bbb-3(b)(1), unless the authorization is terminated or revoked.  Performed at [facility], [address] [place], [place] [address]   Culture, blood (routine x 2)     Status: None   Collection Time: 10/14/2022  3:13 AM   Specimen: BLOOD  Result Value Ref Range Status   Specimen Description BLOOD [facility] ARM  Final   Special Requests   Final    BOTTLES DRAWN AEROBIC AND ANAEROBIC Blood Culture adequate volume   Culture   Final    NO GROWTH 5 DAYS Performed at [facility], [address] [place], [place] [address]    Report Status 10/27/2022 FINAL  Final  Gastrointestinal Panel by PCR , Stool     Status: None   Collection Time: 10/20/2022  3:47 PM   Specimen: Stool  Result Value Ref Range Status   Campylobacter species NOT DETECTED NOT DETECTED Final   Plesimonas shigelloides NOT DETECTED NOT DETECTED Final   Salmonella species NOT DETECTED NOT DETECTED Final   Yersinia enterocolitica NOT DETECTED NOT DETECTED Final   Vibrio species NOT DETECTED NOT DETECTED Final   Vibrio cholerae NOT DETECTED NOT DETECTED Final   Enteroaggregative E coli (EAEC) NOT DETECTED NOT DETECTED Final   Enteropathogenic E coli (EPEC) NOT DETECTED NOT DETECTED Final   Enterotoxigenic E coli (ETEC) NOT DETECTED NOT DETECTED Final   Shiga like toxin producing E coli (STEC) NOT DETECTED NOT DETECTED Final   Shigella/Enteroinvasive E coli (EIEC) NOT DETECTED NOT DETECTED Final   Cryptosporidium NOT DETECTED NOT DETECTED Final   Cyclospora cayetanensis NOT DETECTED NOT DETECTED Final   Entamoeba histolytica NOT DETECTED NOT DETECTED Final   Giardia lamblia NOT DETECTED NOT DETECTED Final   Adenovirus F40/41 NOT DETECTED NOT DETECTED Final   Astrovirus NOT DETECTED NOT DETECTED Final   Norovirus GI/GII NOT DETECTED NOT DETECTED Final   Rotavirus A NOT DETECTED NOT DETECTED Final   Sapovirus  (I, II, IV, and V) NOT DETECTED NOT DETECTED Final    Comment: Performed at [facility], [address] [place], [place] [address]  Urine Culture     Status: None   Collection Time: 10/21/2022  7:59 PM   Specimen: In/Out Cath Urine  Result Value Ref Range Status   Specimen Description   Final    IN/OUT CATH URINE Performed at [facility], [address] [place], [place] [address]    Special Requests   Final    NONE Performed at [facility], [address], [place], [place] [address]    Culture   Final    NO GROWTH Performed at [facility], [address] [place],  [address]    Report Status 09/28/2022 FINAL  Final    Lab Basic Metabolic Panel: Recent Labs  Lab 09/28/22 0438 09/28/22 1617 09/29/22 0117 09/29/22 1347 09/29/22 2223 09/30/22 0327 2022-10-27 0158  NA 132*  --  134* 135 138 138 141  K 2.8*   < > 3.7 3.9 3.9 3.7 3.5  CL 97*  --  102 105 103 105 110  CO2 25  --  '24 22 23 22 '$ 19*  GLUCOSE 139*  --  135* 161* 150* 160* 166*  BUN 36*  --  35* 40* 47* 50* 68*  CREATININE 1.12  --  1.06 1.19 1.40* 1.39* 1.56*  CALCIUM 6.9*  --  7.4* 7.1* 7.2* 7.0* 6.7*  MG 2.4  --  2.3 2.2  --  2.1 2.4  PHOS 2.8  --  1.7* 2.4*  --  4.1 4.0   < > = values in this interval not displayed.   Liver Function Tests: Recent Labs  Lab 09/24/2022 0101 10/23/2022 1959 09/27/22 0437 09/29/22 2223 10-03-22 0158  AST 42* 66* 68* 146* 168*  ALT '13 15 17 '$ 41 45*  ALKPHOS 70 46 38 87 83  BILITOT 1.2 1.1 1.2 1.2 1.1  PROT 6.6 5.4* 5.4* 5.0* 4.2*  ALBUMIN 3.9 3.2* 3.2* 2.5* 2.1*   Recent Labs  Lab 09/25/2022 0101  LIPASE 61*   No results for input(s): "AMMONIA" in the last 168 hours. CBC: Recent Labs  Lab 09/29/22 1054 09/29/22 1347 09/29/22 1712 09/29/22 2223 09/30/22 0327 09/30/22 1628 09/30/22 2017 10/03/2022 0158  WBC 7.5 7.7 7.4 8.7 8.9  --   --  15.6*  NEUTROABS 6.0 6.1 5.6 6.3  --   --   --  11.4*  HGB 9.1* 8.8* 8.3* 7.9* 8.7*  8.1* 7.5* 8.1*  8.5*  HCT 26.4* 26.1* 24.2* 24.1* 25.9* 23.6* 22.2* 23.7*  25.7*  MCV 86.8 87.9 86.7 90.3 89.0  --   --  84.3  PLT 122* 129* 119* 123* 122*  --   --  95*   Cardiac Enzymes: No results for input(s): "CKTOTAL", "CKMB", "CKMBINDEX", "TROPONINI" in the last 168 hours. Sepsis Labs: Recent Labs  Lab 09/27/2022 0101 09/29/2022 0313 09/27/22 0437 09/28/22 0438 09/28/22 1325 09/29/22 1712 09/29/22 2223 09/30/22 0327 09/30/22 0819 09/30/22 2208 10/03/2022 0158 Oct 03, 2022 0903  PROCALCITON <0.10  --  23.82 19.94  --   --   --   --   --   --   --   --   WBC 17.3*   < > 11.2* 8.3   < > 7.4 8.7 8.9  --   --  15.6*  --   LATICACIDVEN 5.3*   < > 3.1* 2.2*   < > 1.9  --   --  2.6* 4.0*  --  2.8*   < > = values in this interval not displayed.       GOALS OF CARE DISCUSSION   The Clinical status was relayed to family in detail- Daughter at bedside   Updated and notified of patients medical condition- Patient with increased WOB and using accessory muscles to breathe Explained to family course of therapy and the modalities    Patient with Progressive multiorgan failure with a very high probablity of a very minimal chance of meaningful recovery despite all aggressive and optimal medical therapy.      Daughter understands the situation.   Family has consented and agreed to DNR/DNI status and would like to proceed with Comfort care measures   Will implement end of life visitation policy, Wife at bedside, will proceed with Comfort care measures   Family are satisfied with Plan of action and management. All questions answered     PATIENT PRONOUNCED DEAD 1153AM Oct 03, 2022   Flora Lipps 2022/10/03, 11:56 AM

## 2022-10-25 NOTE — Progress Notes (Signed)
Contacted by nurse to provide support to daughter bedside. Met with daughter who was also caregiver to her mom, ( with parkinson's) she is also only child and all decisions fall on her.

## 2022-10-25 NOTE — Progress Notes (Signed)
NAME:  Daniel Austin, MRN:  [id number], DOB:  [birth year], LOS: 58 ADMISSION DATE:  10/03/2022, CHIEF COMPLAINT:  Ischemic colitis   History of Present Illness:   82 yo M presenting to [facility] ED from home via EMS on 10/23/2022 with a chief complaint of sepsis alert. History provided by daughter and spouse bedside as patient is altered. Patient was in his normal state of health until he took a laxative dose 2 days ago then had uncontrollable diarrhea.  Spouse also described nausea with dry heaves and some mild emesis with poor p.o. intake. She denied fever, chest pain & shortness of breath.  They reported he fell and when the daughter arrived to assist her mother he appeared tremulous and diaphoretic. Family denies EtOH use/ NSAID use. EMS reported being called out due to nausea/vomiting and diarrhea, and the patient had a syncopal episode when first responders stood him up.  EMS reported hypotension in the field patient received a liter NS bolus and was given 4 mg of IV Zofran prior to arrival.   ED course: Upon arrival patient confused and severely hypotensive with tachypnea.  Sepsis protocol initiated.  Labs significant for hyponatremia (possible acute on chronic), hyperglycemia, AKI, AGMA, transaminitis, leukocytosis with lactic acidosis and elevated troponin.  Imaging suspicious for UTI, also revealing severe gastritis.  Post total 2.5 L IV fluid resuscitation between EMS and ED patient remained hypotensive requiring low-dose peripheral vasopressor support. ABG: 7.30/ 22/67/10.8   CXR 10/05/2022: Low lung volumes without active cardiopulmonary disease CT head without contrast 10/24/2022: No acute intracranial abnormality.  Chronic right frontal parietal and right parietoccipital infarcts.  Generalized cerebral atrophy and microvascular disease changes of the supratentorial brain. CT angio chest PE 10/13/2022: No evidence of pulmonary embolism or other acute intrathoracic process.  Marked severity coronary  artery calcification, small anterior pericardial effusion. CT abdomen and pelvis with contrast 10/08/2022: Findings consistent with marked severity gastritis and/or duodenitis.  Mild to moderate severity diffuse enteritis.  Large amount of stool within the mid to distal sigmoid colon and rectum consistent with constipation.  Sigmoid diverticulosis, 3.8 cm x 3.7 cm x 3.8 cm infrarenal AAA.  7 cm x 4.8 cm left inguinal hernia.  Mild diffuse urinary bladder wall thickening which may represent cystitis.    PCCM consulted for admission due to circulatory shock secondary to hypovolemia and suspected sepsis.  Pertinent  Medical History  Anemia Anxiety & depression CAD ICM status post PCI HFrEF (echo 2018 LVEF 45%) CVA Heart block status post PPM 12/08 Hypertension Hypercholesterolemia GERD Hypothyroidism Gastric ulcer status post-repair  Significant Hospital Events: Including procedures, antibiotic start and stop dates in addition to other pertinent events   10/15/2022: Admit to ICU with circulatory shock secondary to hypovolemia and sepsis POA in the setting of gastritis and possible UTI requiring peripheral vasopressor support. 1/3: Ischemic sigmoid colitis, likely complicated secondary to sliding left inguinal hernia. EXPLORATORY LAPAROTOMY; SIGMOID COLECTOMY WITH END COLOSTOMY  CREATION (Abdomen)  1/4: Extubated 1/5: Awake, comfortable in bed. Blood from the stoma noted 1/6: more blood from the stoma overnight, CTA of the abdomen reassuring 1/8 remains in shock  Interim History / Subjective:  +ABD pain Remains on pressors Severe shock Remains critically ill  Patient is suffering and in pain  Objective   Blood pressure (!) 103/50, pulse 88, temperature 99.3 F (37.4 C), temperature source Axillary, resp. rate (!) 22, height 6' (1.829 m), weight 90.6 kg, SpO2 100 %.    FiO2 (%):  [28 %] 28 %  Intake/Output Summary (Last 24 hours) at Oct 07, 2022 0732 Last data filed at 2022/10/07  0600 Gross per 24 hour  Intake 4830.64 ml  Output 3700 ml  Net 1130.64 ml    Filed Weights   09/29/22 0433 09/30/22 0315 10/07/22 0327  Weight: 90.1 kg 90.4 kg 90.6 kg      REVIEW OF SYSTEMS  PATIENT IS UNABLE TO PROVIDE COMPLETE REVIEW OF SYSTEMS DUE TO SEVERE CRITICAL ILLNESS   PHYSICAL EXAMINATION:  GENERAL:critically ill appearing,  EYES: Pupils equal, round, reactive to light.  No scleral icterus.  MOUTH: Moist mucosal membrane.  NECK: Supple.  PULMONARY: Lungs clear to auscultation, +rhonchi CARDIOVASCULAR: S1 and S2.  Regular rate and rhythm GASTROINTESTINAL: ostomy in place, intact MUSCULOSKELETAL: +edema.  NEUROLOGIC: obtunded SKIN:normal, warm to touch, Capillary refill delayed  Pulses present bilaterally    Assessment & Plan:   82 yo white male admitted to ICU post op resp failure due to severe septic shock and ischemic bowel s/p extubation remains on pressors with hypovolumic and septic shock   neurology Metabolic Encephalopathy  Cardiovascular Distributive Shock Demand Ischemia History of CAD (DES mid LAD)  SHOCK SOURCE-Developed distributive shock secondary to a combination of ischemic bowel, possible peritonitis, Gi bleed, and fluid shifts following colonic resection -use vasopressors to keep MAP>65 as needed -consider stress dose steroids -goal MAP of 65 mmHg -cardiology consult appreciated -continue nor-epinephrine and vasopressin  CARDIAC  Patient had a TTE that shows low normal EF, with no notable hypokinesis. TTE with pericardial effusion previously noted on CT chest. Cardiology consulted and are following, they are not concerned about signs of tamponade. No signs of pulsus paradoxus or JV distention on physical exam.   Pulmonary Extubated successfully HIGH RISK FOR INTUBATION  Gastrointestinal Ischemic Colitis Patient is s/p Hartman's procedure secondary to ischemic colitis from a left inguinal hernia. His stoma is now matured with  mucosal ischemic changes that surgery is closely following. Does have blood through the stoma with stable hemoglobin. Repeat CTA of the abdomen is re-assuring, thou does have embolus in the IMA, unclear if new or old. Patient could have had ischemic colitis from an embolic phenomenon contribute to his presentation. GI STATES NOTHING TO ASSESS FROM THEIR POV DUE TO SEVERE SHOCK  ACUTE KIDNEY INJURY/Renal Failure -continue Foley Catheter-assess need -Avoid nephrotoxic agents -Follow urine output, BMP -Ensure adequate renal perfusion, optimize oxygenation -Renal dose medications   Intake/Output Summary (Last 24 hours) at October 07, 2022 0740 Last data filed at 2022-10-07 0600 Gross per 24 hour  Intake 4830.64 ml  Output 3700 ml  Net 1130.64 ml    ENDO - ICU hypoglycemic\Hyperglycemia protocol -check FSBS per protocol   GI GI PROPHYLAXIS as indicated  NUTRITIONAL STATUS DIET-->NPO Constipation protocol as indicated   ELECTROLYTES -follow labs as needed -replace as needed -pharmacy consultation and following   INFECTIOUS DISEASE Ischemic Colitis -continue antibiotics as prescribed Broad spectrum antibiotics given concern of infection secondary to perforated bowel (egress of discolored fluid from the peritoneal cavity noted in the operative report).  -Continue Cefepime and Metronidazole  Best Practice (right click and "Reselect all SmartList Selections" daily)   Diet/type: tubefeeds DVT prophylaxis: other (holding LMWH due to bleed) GI prophylaxis: PPI Lines: Central line and yes and it is still needed Foley:  Yes, and it is still needed Code Status:  full code   Labs   CBC: Recent Labs  Lab 09/29/22 1054 09/29/22 1347 09/29/22 1712 09/29/22 2223 09/30/22 0327 09/30/22 1628 09/30/22 2017 Oct 07, 2022 0158  WBC 7.5 7.7  7.4 8.7 8.9  --   --  15.6*  NEUTROABS 6.0 6.1 5.6 6.3  --   --   --  11.4*  HGB 9.1* 8.8* 8.3* 7.9* 8.7* 8.1* 7.5* 8.1*  8.5*  HCT 26.4* 26.1* 24.2*  24.1* 25.9* 23.6* 22.2* 23.7*  25.7*  MCV 86.8 87.9 86.7 90.3 89.0  --   --  84.3  PLT 122* 129* 119* 123* 122*  --   --  95*     Basic Metabolic Panel: Recent Labs  Lab 09/28/22 0438 09/28/22 1617 09/29/22 0117 09/29/22 1347 09/29/22 2223 09/30/22 0327 10/29/22 0158  NA 132*  --  134* 135 138 138 141  K 2.8*   < > 3.7 3.9 3.9 3.7 3.5  CL 97*  --  102 105 103 105 110  CO2 25  --  '24 22 23 22 '$ 19*  GLUCOSE 139*  --  135* 161* 150* 160* 166*  BUN 36*  --  35* 40* 47* 50* 68*  CREATININE 1.12  --  1.06 1.19 1.40* 1.39* 1.56*  CALCIUM 6.9*  --  7.4* 7.1* 7.2* 7.0* 6.7*  MG 2.4  --  2.3 2.2  --  2.1 2.4  PHOS 2.8  --  1.7* 2.4*  --  4.1 4.0   < > = values in this interval not displayed.    GFR: Estimated Creatinine Clearance: 40.1 mL/min (A) (by C-G formula based on SCr of 1.56 mg/dL (H)). Recent Labs  Lab 09/27/2022 0101 10/23/2022 0313 09/27/22 0437 09/28/22 0438 09/28/22 1325 09/29/22 1347 09/29/22 1712 09/29/22 2223 09/30/22 0327 09/30/22 0819 09/30/22 2208 29-Oct-2022 0158  PROCALCITON <0.10  --  23.82 19.94  --   --   --   --   --   --   --   --   WBC 17.3*   < > 11.2* 8.3   < > 7.7 7.4 8.7 8.9  --   --  15.6*  LATICACIDVEN 5.3*   < > 3.1* 2.2*   < > 2.1* 1.9  --   --  2.6* 4.0*  --    < > = values in this interval not displayed.     Liver Function Tests: Recent Labs  Lab 10/07/2022 0101 10/04/2022 1959 09/27/22 0437 09/29/22 2223 10-29-2022 0158  AST 42* 66* 68* 146* 168*  ALT '13 15 17 '$ 41 45*  ALKPHOS 70 46 38 87 83  BILITOT 1.2 1.1 1.2 1.2 1.1  PROT 6.6 5.4* 5.4* 5.0* 4.2*  ALBUMIN 3.9 3.2* 3.2* 2.5* 2.1*    Recent Labs  Lab 10/21/2022 0101  LIPASE 61*    No results for input(s): "AMMONIA" in the last 168 hours.  ABG    Component Value Date/Time   PHART 7.46 (H) 09/27/2022 0838   PCO2ART 30 (L) 09/27/2022 0838   PO2ART 101 09/27/2022 0838   HCO3 24.1 09/29/2022 2224   ACIDBASEDEF 0.3 09/29/2022 2224   O2SAT 45.8 09/29/2022 2224      Coagulation Profile: Recent Labs  Lab 10/02/2022 2111  INR 2.0*     Cardiac Enzymes: No results for input(s): "CKTOTAL", "CKMB", "CKMBINDEX", "TROPONINI" in the last 168 hours.  HbA1C: Hgb A1c MFr Bld  Date/Time Value Ref Range Status  08/29/2022 10:16 AM 6.1 4.6 - 6.5 % Final    Comment:    Glycemic Control Guidelines for People with Diabetes:Non Diabetic:  <6%Goal of Therapy: <7%Additional Action Suggested:  >8%   03/14/2021 11:51 AM 6.1 4.6 - 6.5 % Final  Comment:    Glycemic Control Guidelines for People with Diabetes:Non Diabetic:  <6%Goal of Therapy: <7%Additional Action Suggested:  >8%     CBG: Recent Labs  Lab 09/30/22 1130 09/30/22 1555 09/30/22 1928 09/30/22 2312 10/12/2022 0331  GLUCAP 147* 109* 135* 130* 144*      Past Medical History:  He,  has a past medical history of Anemia, Anxiety, Arthritis, CAD (coronary artery disease), Cardiomyopathy ([place]), Carotid artery stenosis, CHF (congestive heart failure) ([place]), Depression, Fractures, multiple, GERD (gastroesophageal reflux disease), Heart block, Hypercholesterolemia, Hyperglycemia, Hypertension, Hypothyroidism, Knee pain, Presence of permanent cardiac pacemaker, Shortness of breath dyspnea, Stroke ([place]), Syncope, and Ulcer of gastric fundus.   Surgical History:   Past Surgical History:  Procedure Laterality Date   CARDIAC CATHETERIZATION     CATARACT EXTRACTION W/PHACO Right 03/07/2021   Procedure: CATARACT EXTRACTION PHACO AND INTRAOCULAR LENS PLACEMENT (IOC) RIGHT 8.69 00:55.2;  Surgeon: Birder Robson, MD;  Location: [facility];  Service: Ophthalmology;  Laterality: Right;   COLONOSCOPY N/A 07/06/2021   Procedure: COLONOSCOPY;  Surgeon: Annamaria Helling, DO;  Location: [facility];  Service: Gastroenterology;  Laterality: N/A;   CORONARY ANGIOPLASTY     stents x 3   heart stint  11/04/2014   [facility]   HERNIA REPAIR     inguinal   INSERT / REPLACE / REMOVE PACEMAKER      JOINT REPLACEMENT     right knee   KNEE ARTHROPLASTY Right 02/08/2016   Procedure: COMPUTER ASSISTED TOTAL KNEE ARTHROPLASTY;  Surgeon: Dereck Leep, MD;  Location: [facility];  Service: Orthopedics;  Laterality: Right;   KNEE ARTHROPLASTY Left 06/27/2016   Procedure: COMPUTER ASSISTED TOTAL KNEE ARTHROPLASTY;  Surgeon: Dereck Leep, MD;  Location: [facility];  Service: Orthopedics;  Laterality: Left;   LAPAROTOMY N/A 10/21/2022   Procedure: EXPLORATORY LAPAROTOMY; SIGMOID COLECTOMY WITH END COLOSTOMY CREATION;  Surgeon: Ronny Bacon, MD;  Location: [facility];  Service: General;  Laterality: N/A;   PACEMAKER INSERTION  08/2007   PACEMAKER INSERTION N/A 11/16/2015   Procedure: INSERTION PACEMAKER/ PACEMAKER CHANGE OUT;  Surgeon: Isaias Cowman, MD;  Location: [facility];  Service: Cardiovascular;  Laterality: N/A;   PERCUTANEOUS PLACEMENT INTRAVASCULAR STENT CERVICAL CAROTID ARTERY     twice, right carotid artery     Social History:   reports that he has never smoked. He has never used smokeless tobacco. He reports that he does not drink alcohol and does not use drugs.   Family History:  His family history includes Cancer in his sister and sister; Colon polyps in his father; Diabetes in his sister; Heart disease in his father and mother; Thyroid disease in his father, sister, and sister. There is no history of Colon cancer or Prostate cancer.   Allergies Allergies  Allergen Reactions   Beta Adrenergic Blockers Other (See Comments)    Second Degree Heart Block   Lipitor [Atorvastatin] Other (See Comments)    Myalgia      Home Medications  Prior to Admission medications   Medication Sig Start Date End Date Taking? Authorizing Provider  amLODipine (NORVASC) 5 MG tablet TAKE 1 TABLET (5 MG TOTAL) BY MOUTH DAILY. 08/28/21  Yes Einar Pheasant, MD  Aspirin 81 MG EC tablet Take 81 mg by mouth daily.   Yes [provider]  citalopram (CELEXA) 20 MG tablet TAKE 1 TABLET BY MOUTH  EVERY DAY 05/31/22  Yes Einar Pheasant, MD  clopidogrel (PLAVIX) 75 MG tablet TAKE 1 TABLET BY MOUTH EVERY DAY 07/12/22  Yes Einar Pheasant, MD  levothyroxine (SYNTHROID) 125 MCG tablet TAKE 1 TABLET BY MOUTH EVERY DAY BEFORE BREAKFAST 02/26/22  Yes Einar Pheasant, MD  lisinopril (ZESTRIL) 40 MG tablet TAKE 1 TABLET BY MOUTH EVERY DAY 03/19/22  Yes Einar Pheasant, MD  metoprolol tartrate (LOPRESSOR) 25 MG tablet TAKE 1 TABLET BY MOUTH TWICE A DAY 08/27/22  Yes Einar Pheasant, MD  pantoprazole (PROTONIX) 40 MG tablet TAKE 1 TABLET BY MOUTH EVERY DAY 05/04/22  Yes Einar Pheasant, MD  rosuvastatin (CRESTOR) 20 MG tablet TAKE 1 TABLET BY MOUTH EVERY DAY AT NIGHT 05/04/22  Yes Einar Pheasant, MD  acetaminophen (TYLENOL) 500 MG tablet Take 1,000 mg by mouth every 6 (six) hours as needed.    [provider]  fexofenadine (ALLEGRA) 180 MG tablet Take 180 mg by mouth daily. As needed    [provider]  LORazepam (ATIVAN) 1 MG tablet 1/2-1 TABLET AT BEDTIME AS NEEDED . 09/11/22   Einar Pheasant, MD  nitroGLYCERIN (NITROSTAT) 0.4 MG SL tablet Place 0.4 mg under the tongue every 5 (five) minutes as needed. If a third tablet is needed please call 911. Patient not taking: Reported on 09/29/2022    [provider]  triamcinolone (NASACORT) 55 MCG/ACT AERO nasal inhaler Place into the nose. Patient not taking: Reported on 10/13/2022 08/28/18   [provider]  triamcinolone cream (KENALOG) 0.1 % Apply 1 application topically 2 (two) times daily. Patient not taking: Reported on 09/27/2022 09/25/17   Einar Pheasant, MD       DVT/GI PRX  assessed I Assessed the need for Labs I Assessed the need for Foley I Assessed the need for Central Venous Line Family Discussion when available I Assessed the need for Mobilization I made an Assessment of medications to be adjusted accordingly Safety Risk assessment completed  CASE DISCUSSED IN MULTIDISCIPLINARY ROUNDS WITH ICU  TEAM     Critical Care Time devoted to patient care services described in this note is 55 minutes.  Critical care was necessary to treat /prevent imminent and life-threatening deterioration. Overall, patient is critically ill, prognosis is guarded.  Patient with Multiorgan failure and at high risk for cardiac arrest and death.    Corrin Parker, M.D.  [facility]  Medical Director [place] Director [facility] Cardio-Pulmonary Department

## 2022-10-25 NOTE — Progress Notes (Signed)
   2022/10/20 1300  Spiritual Encounters  Type of Visit Follow up  Care provided to: Pt and family  Referral source Nurse (RN/NT/LPN)  Reason for visit Patient death   Chaplain provided prayer shawl to family amid patient death. Chaplain supported family while they expressed sadness and grief over patient's passing. Chaplain services is available for follow up as needed.

## 2022-10-25 DEATH — deceased

## 2022-11-01 ENCOUNTER — Encounter: Payer: Medicare Other | Admitting: Internal Medicine

## 2023-05-30 ENCOUNTER — Ambulatory Visit (INDEPENDENT_AMBULATORY_CARE_PROVIDER_SITE_OTHER): Payer: Medicare Other | Admitting: Nurse Practitioner

## 2023-05-30 ENCOUNTER — Encounter (INDEPENDENT_AMBULATORY_CARE_PROVIDER_SITE_OTHER): Payer: Medicare Other
